# Patient Record
Sex: Female | Born: 1950 | Race: White | Hispanic: No | Smoking: Current every day smoker
Health system: Southern US, Community
[De-identification: ages and names within clinical notes are randomized; demographics above are authoritative.]

## PROBLEM LIST (undated history)

## (undated) DIAGNOSIS — R413 Other amnesia: Secondary | ICD-10-CM

## (undated) DIAGNOSIS — J45909 Unspecified asthma, uncomplicated: Secondary | ICD-10-CM

## (undated) DIAGNOSIS — G8929 Other chronic pain: Secondary | ICD-10-CM

## (undated) DIAGNOSIS — R7303 Prediabetes: Secondary | ICD-10-CM

## (undated) DIAGNOSIS — R29898 Other symptoms and signs involving the musculoskeletal system: Secondary | ICD-10-CM

## (undated) DIAGNOSIS — R569 Unspecified convulsions: Secondary | ICD-10-CM

## (undated) DIAGNOSIS — J189 Pneumonia, unspecified organism: Secondary | ICD-10-CM

## (undated) DIAGNOSIS — M1990 Unspecified osteoarthritis, unspecified site: Secondary | ICD-10-CM

## (undated) DIAGNOSIS — R296 Repeated falls: Secondary | ICD-10-CM

## (undated) DIAGNOSIS — R519 Headache, unspecified: Secondary | ICD-10-CM

## (undated) DIAGNOSIS — I10 Essential (primary) hypertension: Secondary | ICD-10-CM

## (undated) DIAGNOSIS — Z9989 Dependence on other enabling machines and devices: Secondary | ICD-10-CM

## (undated) DIAGNOSIS — K219 Gastro-esophageal reflux disease without esophagitis: Secondary | ICD-10-CM

## (undated) DIAGNOSIS — R42 Dizziness and giddiness: Secondary | ICD-10-CM

## (undated) DIAGNOSIS — F419 Anxiety disorder, unspecified: Secondary | ICD-10-CM

## (undated) DIAGNOSIS — J449 Chronic obstructive pulmonary disease, unspecified: Secondary | ICD-10-CM

## (undated) DIAGNOSIS — G629 Polyneuropathy, unspecified: Secondary | ICD-10-CM

## (undated) DIAGNOSIS — I209 Angina pectoris, unspecified: Secondary | ICD-10-CM

## (undated) DIAGNOSIS — M25569 Pain in unspecified knee: Secondary | ICD-10-CM

## (undated) DIAGNOSIS — R4189 Other symptoms and signs involving cognitive functions and awareness: Secondary | ICD-10-CM

## (undated) DIAGNOSIS — R52 Pain, unspecified: Secondary | ICD-10-CM

## (undated) DIAGNOSIS — J42 Unspecified chronic bronchitis: Secondary | ICD-10-CM

## (undated) DIAGNOSIS — I219 Acute myocardial infarction, unspecified: Secondary | ICD-10-CM

## (undated) DIAGNOSIS — R51 Headache: Secondary | ICD-10-CM

## (undated) DIAGNOSIS — G43909 Migraine, unspecified, not intractable, without status migrainosus: Secondary | ICD-10-CM

## (undated) DIAGNOSIS — M48062 Spinal stenosis, lumbar region with neurogenic claudication: Secondary | ICD-10-CM

## (undated) DIAGNOSIS — M549 Dorsalgia, unspecified: Secondary | ICD-10-CM

## (undated) DIAGNOSIS — F329 Major depressive disorder, single episode, unspecified: Secondary | ICD-10-CM

## (undated) DIAGNOSIS — IMO0001 Reserved for inherently not codable concepts without codable children: Secondary | ICD-10-CM

## (undated) HISTORY — DX: Other amnesia: R41.3

## (undated) HISTORY — PX: BACK SURGERY: SHX140

---
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
## 2005-10-13 HISTORY — PX: KNEE ARTHROSCOPY: SHX127

## 2009-02-11 ENCOUNTER — Emergency Department (HOSPITAL_COMMUNITY): Admission: EM | Admit: 2009-02-11 | Discharge: 2009-02-11 | Payer: Self-pay | Admitting: Emergency Medicine

## 2009-06-22 ENCOUNTER — Emergency Department (HOSPITAL_COMMUNITY): Admission: EM | Admit: 2009-06-22 | Discharge: 2009-06-22 | Payer: Self-pay | Admitting: Emergency Medicine

## 2010-01-11 ENCOUNTER — Emergency Department (HOSPITAL_COMMUNITY): Admission: EM | Admit: 2010-01-11 | Discharge: 2010-01-11 | Payer: Self-pay | Admitting: Emergency Medicine

## 2010-07-31 ENCOUNTER — Emergency Department (HOSPITAL_COMMUNITY): Admission: EM | Admit: 2010-07-31 | Discharge: 2010-08-01 | Payer: Self-pay | Admitting: Emergency Medicine

## 2011-01-01 LAB — URINE MICROSCOPIC-ADD ON

## 2011-01-01 LAB — BASIC METABOLIC PANEL
Calcium: 9.3 mg/dL (ref 8.4–10.5)
Chloride: 105 mEq/L (ref 96–112)
Creatinine, Ser: 0.79 mg/dL (ref 0.4–1.2)
GFR calc Af Amer: >60 mL/min (ref >60)
GFR calc non Af Amer: >60 mL/min (ref >60)

## 2011-01-01 LAB — URINALYSIS, ROUTINE W REFLEX MICROSCOPIC
Glucose, UA: NEGATIVE mg/dL
Leukocytes, UA: NEGATIVE
Specific Gravity, Urine: 1.025 (ref 1.005–1.030)
pH: 5.5 (ref 5.0–8.0)

## 2011-01-01 LAB — DIFFERENTIAL
Lymphocytes Relative: 39 % (ref 12–46)
Lymphs Abs: 4.9 10*3/uL — ABNORMAL HIGH (ref 0.7–4.0)
Monocytes Relative: 10 % (ref 3–12)
Neutro Abs: 6.2 10*3/uL (ref 1.7–7.7)
Neutrophils Relative %: 48 % (ref 43–77)

## 2011-01-01 LAB — CBC
MCV: 89.1 fL (ref 78.0–100.0)
RBC: 5.06 MIL/uL (ref 3.87–5.11)
WBC: 12.8 10*3/uL — ABNORMAL HIGH (ref 4.0–10.5)

## 2011-04-05 ENCOUNTER — Emergency Department (HOSPITAL_COMMUNITY)
Admission: EM | Admit: 2011-04-05 | Discharge: 2011-04-05 | Disposition: A | Discharge status: Home / Self Care | Payer: Self-pay | Attending: Emergency Medicine | Admitting: Emergency Medicine

## 2011-04-05 DIAGNOSIS — M25569 Pain in unspecified knee: Secondary | ICD-10-CM | POA: Insufficient documentation

## 2011-04-05 LAB — BASIC METABOLIC PANEL
Calcium: 9.9 mg/dL (ref 8.4–10.5)
GFR calc Af Amer: >60 mL/min (ref >60)
GFR calc non Af Amer: >60 mL/min (ref >60)
Potassium: 3.7 mEq/L (ref 3.5–5.1)
Sodium: 135 mEq/L (ref 135–145)

## 2011-05-16 ENCOUNTER — Encounter: Payer: Self-pay | Admitting: *Deleted

## 2011-05-16 ENCOUNTER — Emergency Department (HOSPITAL_COMMUNITY)
Admission: EM | Admit: 2011-05-16 | Discharge: 2011-05-17 | Disposition: A | Discharge status: Home / Self Care | Payer: Self-pay | Attending: Emergency Medicine | Admitting: Emergency Medicine

## 2011-05-16 DIAGNOSIS — M542 Cervicalgia: Secondary | ICD-10-CM | POA: Insufficient documentation

## 2011-05-16 DIAGNOSIS — M25559 Pain in unspecified hip: Secondary | ICD-10-CM | POA: Insufficient documentation

## 2011-05-16 DIAGNOSIS — R51 Headache: Secondary | ICD-10-CM | POA: Insufficient documentation

## 2011-05-16 DIAGNOSIS — R42 Dizziness and giddiness: Secondary | ICD-10-CM | POA: Insufficient documentation

## 2011-05-16 DIAGNOSIS — M25569 Pain in unspecified knee: Secondary | ICD-10-CM | POA: Insufficient documentation

## 2011-05-16 DIAGNOSIS — F17200 Nicotine dependence, unspecified, uncomplicated: Secondary | ICD-10-CM | POA: Insufficient documentation

## 2011-05-16 DIAGNOSIS — R1110 Vomiting, unspecified: Secondary | ICD-10-CM | POA: Insufficient documentation

## 2011-05-16 DIAGNOSIS — IMO0001 Reserved for inherently not codable concepts without codable children: Secondary | ICD-10-CM | POA: Insufficient documentation

## 2011-05-16 DIAGNOSIS — M25469 Effusion, unspecified knee: Secondary | ICD-10-CM | POA: Insufficient documentation

## 2011-05-16 HISTORY — DX: Unspecified osteoarthritis, unspecified site: M19.90

## 2011-05-16 MED ORDER — IBUPROFEN 800 MG PO TABS
800.0000 mg | ORAL_TABLET | Freq: Once | ORAL | Status: AC
  Administered 2011-05-16: 800 mg via ORAL
  Filled 2011-05-16: qty 1

## 2011-05-16 MED ORDER — HYDROCODONE-ACETAMINOPHEN 5-325 MG PO TABS
2.0000 | ORAL_TABLET | Freq: Once | ORAL | Status: AC
  Administered 2011-05-16: 2 via ORAL
  Filled 2011-05-16: qty 2

## 2011-05-16 MED ORDER — ONDANSETRON HCL 4 MG PO TABS
4.0000 mg | ORAL_TABLET | Freq: Once | ORAL | Status: AC
  Administered 2011-05-16: 4 mg via ORAL
  Filled 2011-05-16: qty 1

## 2011-05-16 NOTE — ED Provider Notes (Signed)
History     CSN: [id number] Arrival date & time: 05/16/2011 10:53 PM  Chief Complaint  Patient presents with  . Knee Pain  . Hip Pain    bilateral  . Headache   Patient is a 60 y.o. female presenting with knee pain and headaches. The history is provided by the patient.  Knee Pain This is a recurrent problem. The current episode started in the past 7 days. The problem occurs daily. Associated symptoms include arthralgias, headaches, myalgias, nausea and neck pain. The symptoms are aggravated by nothing. She has tried acetaminophen for the symptoms. The treatment provided no relief.  Headache  Associated symptoms include nausea.  Knee Pain This is a recurrent problem. The current episode started in the past 7 days. The problem occurs daily. Associated symptoms include headaches. The symptoms are aggravated by nothing. She has tried acetaminophen for the symptoms. The treatment provided no relief.    Past Medical History  Diagnosis Date  . Arthritis   . Bronchitis     Past Surgical History  Procedure Date  . Knee surgery   . Appendectomy   . Tonsillectomy     History reviewed. No pertinent family history.  History  Substance Use Topics  . Smoking status: Current Everyday Smoker -- 0.5 packs/day  . Smokeless tobacco: Not on file  . Alcohol Use: No    OB History    Grav Para Term Preterm Abortions TAB SAB Ect Mult Living                  Review of Systems  HENT: Positive for neck pain.   Gastrointestinal: Positive for nausea.  Musculoskeletal: Positive for myalgias and arthralgias.  Neurological: Positive for light-headedness and headaches.    Physical Exam  BP 121/81  Pulse 93  Temp(Src) 98.4 F (36.9 C) (Oral)  Resp 18  Physical Exam  Nursing note and vitals reviewed. Constitutional: She is oriented to person, place, and time. She appears well-developed and well-nourished.  Non-toxic appearance.  HENT:  Head: Normocephalic.  Right Ear: Tympanic membrane  and external ear normal.  Left Ear: Tympanic membrane and external ear normal.  Eyes: EOM and lids are normal. Pupils are equal, round, and reactive to light.  Neck: Normal range of motion. Neck supple. Carotid bruit is not present.  Cardiovascular: Normal rate, regular rhythm, normal heart sounds, intact distal pulses and normal pulses.   Pulmonary/Chest: Breath sounds normal. No respiratory distress.  Abdominal: Soft. Bowel sounds are normal. There is no tenderness. There is no guarding.  Musculoskeletal: Normal range of motion.       Mild to mod effusion of both knees. Warm but not hot. Distal pulse symetrical. Sensory wnl. Pain of the hips with attempted ROM. No deformity noted.  Lymphadenopathy:       Head (right side): No submandibular adenopathy present.       Head (left side): No submandibular adenopathy present.    She has no cervical adenopathy.  Neurological: She is alert and oriented to person, place, and time. She has normal strength. Coordination normal. GCS eye subscore is 4. GCS verbal subscore is 5. GCS motor subscore is 6.  Skin: Skin is warm and dry.  Psychiatric: Her speech is normal and behavior is normal. Judgment and thought content normal.       Pt upset over a situation between her daughters and her increasing pain. Tearful at times.    ED Course  Procedures  MDM I have reviewed nursing notes, vital signs,  and all appropriate lab and imaging results for this patient.      Kathie Dike, [place] 05/16/11 2331 Medical screening examination/treatment/procedure(s) were performed by non-physician practitioner and as supervising physician I was immediately available for consultation/collaboration.  Nicoletta Dress. Colon Branch, MD 05/28/11 2047

## 2011-05-16 NOTE — ED Notes (Signed)
Pt presents after reportedly lowering self to ground in gas station parking lot because has knee and hip pain and headache because of heat

## 2011-05-17 MED ORDER — HYDROCODONE-ACETAMINOPHEN 5-325 MG PO TABS: ORAL_TABLET | ORAL | Status: DC

## 2011-08-02 ENCOUNTER — Encounter (HOSPITAL_COMMUNITY): Payer: Self-pay

## 2011-08-02 ENCOUNTER — Emergency Department (HOSPITAL_COMMUNITY): Payer: Self-pay

## 2011-08-02 ENCOUNTER — Emergency Department (HOSPITAL_COMMUNITY)
Admission: EM | Admit: 2011-08-02 | Discharge: 2011-08-03 | Disposition: A | Discharge status: Home / Self Care | Payer: Self-pay | Attending: Emergency Medicine | Admitting: Emergency Medicine

## 2011-08-02 ENCOUNTER — Other Ambulatory Visit: Payer: Self-pay

## 2011-08-02 DIAGNOSIS — M25559 Pain in unspecified hip: Secondary | ICD-10-CM | POA: Insufficient documentation

## 2011-08-02 DIAGNOSIS — M129 Arthropathy, unspecified: Secondary | ICD-10-CM | POA: Insufficient documentation

## 2011-08-02 DIAGNOSIS — R0789 Other chest pain: Secondary | ICD-10-CM

## 2011-08-02 DIAGNOSIS — M25569 Pain in unspecified knee: Secondary | ICD-10-CM | POA: Insufficient documentation

## 2011-08-02 DIAGNOSIS — F17200 Nicotine dependence, unspecified, uncomplicated: Secondary | ICD-10-CM | POA: Insufficient documentation

## 2011-08-02 DIAGNOSIS — R071 Chest pain on breathing: Secondary | ICD-10-CM | POA: Insufficient documentation

## 2011-08-02 DIAGNOSIS — J40 Bronchitis, not specified as acute or chronic: Secondary | ICD-10-CM | POA: Insufficient documentation

## 2011-08-02 DIAGNOSIS — R05 Cough: Secondary | ICD-10-CM | POA: Insufficient documentation

## 2011-08-02 DIAGNOSIS — R059 Cough, unspecified: Secondary | ICD-10-CM | POA: Insufficient documentation

## 2011-08-02 LAB — POCT I-STAT, CHEM 8
BUN: 22 mg/dL (ref 6–23)
Calcium, Ion: 1.1 mmol/L — ABNORMAL LOW (ref 1.12–1.32)
Creatinine, Ser: 0.8 mg/dL (ref 0.50–1.10)
Hemoglobin: 17 g/dL — ABNORMAL HIGH (ref 12.0–15.0)
Sodium: 142 mEq/L (ref 135–145)
TCO2: 26 mmol/L (ref 0–100)

## 2011-08-02 LAB — POCT I-STAT TROPONIN I

## 2011-08-02 MED ORDER — NITROGLYCERIN 0.4 MG SL SUBL
0.4000 mg | SUBLINGUAL_TABLET | SUBLINGUAL | Status: DC | PRN
  Administered 2011-08-02: 0.4 mg via SUBLINGUAL
  Filled 2011-08-02: qty 25

## 2011-08-02 MED ORDER — ASPIRIN 81 MG PO CHEW
324.0000 mg | CHEWABLE_TABLET | Freq: Once | ORAL | Status: AC
  Administered 2011-08-02: 324 mg via ORAL
  Filled 2011-08-02: qty 4

## 2011-08-02 MED ORDER — SODIUM CHLORIDE 0.9 % IV SOLN: Freq: Once | INTRAVENOUS | Status: DC

## 2011-08-02 NOTE — ED Notes (Signed)
Pt complaining of cough for 2-3 weeks.  Lung sounds posterior, diminished.

## 2011-08-02 NOTE — ED Notes (Signed)
Dull pressure to left chest started 1 hour ago and worsening since

## 2011-08-03 MED ORDER — HYDROCODONE-ACETAMINOPHEN 5-325 MG PO TABS
2.0000 | ORAL_TABLET | Freq: Once | ORAL | Status: AC
  Administered 2011-08-03: 2 via ORAL
  Filled 2011-08-03: qty 2

## 2011-08-03 NOTE — ED Notes (Signed)
Nitro Sl 0.4 mg  Base Vitals: Begin 147/67 RR18, HR 69, O2Sat 98 2LNC Pain 10/10 1st Tab                                            133/65       18        66             97                     4/10 2nd Tab                                           137/65       18        65             98                     4/10 3rd Tab                                            142/66       18        64             96                     2/10

## 2011-08-03 NOTE — ED Provider Notes (Signed)
History     CSN: [id number] Arrival date & time: 08/02/2011 11:17 PM   First MD Initiated Contact with Patient 08/02/11 2334      Chief Complaint  Patient presents with  . Chest Pain    (Consider location/radiation/quality/duration/timing/severity/associated sxs/prior treatment) HPI Comments: Seen 2334 Patient with c/o dull chest pressure x 1 hour. Also had chronic pain to both knees and hips. She is seen by [facility] Department. Pressure to chest feels like a bubble. States it is not made worse by deep breathing. Denies shortness of breath, nausea, vomiting.Patient is on narcotic analgesics for her chronic pain. She states they have not helped with current pain. Patient is a 59 y.o. female presenting with chest pain. The history is provided by the patient.  Chest Pain The chest pain began less than 1 hour ago (pressure in left chest). Chest pain occurs frequently. The chest pain is worsening. At its most intense, the pain is at 5/10. The pain is currently at 4/10. The severity of the pain is moderate. The quality of the pain is described as dull and pressure-like. The pain does not radiate. Exacerbated by: nothing. She tried nothing for the symptoms.     Past Medical History  Diagnosis Date  . Arthritis   . Bronchitis     Past Surgical History  Procedure Date  . Knee surgery   . Appendectomy   . Tonsillectomy     No family history on file.  History  Substance Use Topics  . Smoking status: Current Everyday Smoker -- 0.5 packs/day  . Smokeless tobacco: Not on file  . Alcohol Use: No    OB History    Grav Para Term Preterm Abortions TAB SAB Ect Mult Living                  Review of Systems  Cardiovascular: Positive for chest pain.  Musculoskeletal:       Knee pain, hip pain  All other systems reviewed and are negative.    Allergies  Amitriptyline and Tramadol  Home Medications   Current Outpatient Rx  Name Route Sig Dispense Refill  . ALBUTEROL SULFATE  (2.5 MG/3ML) 0.083% IN NEBU Inhalation Inhale 2.5 mg into the lungs every 6 (six) hours as needed.      Marland Kitchen HYDROCODONE-ACETAMINOPHEN 5-325 MG PO TABS  1 or 2 po q4h prn pain 20 tablet 0  . ACETAMINOPHEN 500 MG PO TABS Oral Take 500 mg by mouth every 6 (six) hours as needed.        BP 156/63  Temp(Src) 97.9 F (36.6 C) (Oral)  Resp 22  Ht 5' 3.75" (1.619 m)  Wt 205 lb (92.987 kg)  BMI 35.46 kg/m2  SpO2 100%  Physical Exam  Nursing note and vitals reviewed. Constitutional: She is oriented to person, place, and time. She appears well-developed and well-nourished.       anxious  HENT:  Head: Normocephalic and atraumatic.  Eyes: EOM are normal.  Neck: Normal range of motion.  Cardiovascular: Normal rate, normal heart sounds and intact distal pulses.   Pulmonary/Chest: Effort normal and breath sounds normal. No respiratory distress. She has no wheezes. She has no rales. She exhibits no tenderness.  Abdominal: Soft. Bowel sounds are normal.  Musculoskeletal: Normal range of motion.       Knees with mild crepitus bilaterally, no effusions, FROM. Hips with FROM.  Neurological: She is alert and oriented to person, place, and time.  Skin: Skin is warm and dry.  ED Course  Procedures (including critical care time) Results for orders placed during the hospital encounter of 08/02/11  POCT I-STAT TROPONIN I      Component Value Range   Troponin i, poc 0.01  0.00 - 0.08 (ng/mL)   Comment 3           POCT I-STAT, CHEM 8      Component Value Range   Sodium 142  135 - 145 (mEq/L)   Potassium 4.0  3.5 - 5.1 (mEq/L)   Chloride 106  96 - 112 (mEq/L)   BUN 22  6 - 23 (mg/dL)   Creatinine, Ser 1.61  0.50 - 1.10 (mg/dL)   Glucose, Bld 92  70 - 99 (mg/dL)   Calcium, Ion 0.96 (*) 1.12 - 1.32 (mmol/L)   TCO2 26  0 - 100 (mmol/L)   Hemoglobin 17.0 (*) 12.0 - 15.0 (g/dL)   HCT 04.5 (*) 40.9 - 46.0 (%)    Dg Chest Portable 1 View  08/03/2011  *RADIOLOGY REPORT*  Clinical Data: Cough for 2 to  3 weeks.  History of bronchitis and smoking.  PORTABLE CHEST - 1 VIEW  Comparison: None.  Findings: The lungs are well-aerated.  Minimal bilateral atelectasis is noted in There is no evidence of pleural effusion or pneumothorax.  The cardiomediastinal silhouette is within normal limits.  No acute osseous abnormalities are seen.  IMPRESSION: Minimal bilateral atelectasis noted; lungs otherwise clear.  Original Report Authenticated By: Tonia Ghent, M.D.    Date: 08/03/2011  2310  Rate:73  Rhythm: normal sinus rhythm  QRS Axis: normal  Intervals: normal  ST/T Wave abnormalities: normal   Conduction Disutrbances:none  Narrative Interpretation:   Old EKG Reviewed: none available   No diagnosis found.    MDM  Patient with chest pressure that began an hour PTA. Given aspirin, ntg x 3 with no relief. Labs, EKG, xray unremarkable. Troponin negative. Patient is low risk for ACS. Patient given narcotic analgesics with improvement in pain. Pt feels improved after observation and/or treatment in ED.Pt stable in ED with no significant deterioration in condition.The patient appears reasonably screened and/or stabilized for discharge and I doubt any other medical condition or other [facility] requiring further screening, evaluation, or treatment in the ED at this time prior to discharge. MDM Reviewed: nursing note, vitals and previous chart Interpretation: labs, ECG and x-ray Total time providing critical care: 40.           Nicoletta Dress. Colon Branch, MD 08/11/11 1726

## 2011-08-03 NOTE — ED Notes (Signed)
Pt daughter upset because her mother was not given RX for Lortab.

## 2012-01-06 ENCOUNTER — Encounter (HOSPITAL_COMMUNITY): Payer: Self-pay | Admitting: Emergency Medicine

## 2012-01-06 ENCOUNTER — Emergency Department (HOSPITAL_COMMUNITY): Payer: Self-pay

## 2012-01-06 ENCOUNTER — Emergency Department (HOSPITAL_COMMUNITY)
Admission: EM | Admit: 2012-01-06 | Discharge: 2012-01-06 | Disposition: A | Discharge status: Home / Self Care | Payer: Self-pay | Attending: Emergency Medicine | Admitting: Emergency Medicine

## 2012-01-06 DIAGNOSIS — M79609 Pain in unspecified limb: Secondary | ICD-10-CM | POA: Insufficient documentation

## 2012-01-06 DIAGNOSIS — F17200 Nicotine dependence, unspecified, uncomplicated: Secondary | ICD-10-CM | POA: Insufficient documentation

## 2012-01-06 DIAGNOSIS — M25559 Pain in unspecified hip: Secondary | ICD-10-CM | POA: Insufficient documentation

## 2012-01-06 DIAGNOSIS — M5430 Sciatica, unspecified side: Secondary | ICD-10-CM | POA: Insufficient documentation

## 2012-01-06 DIAGNOSIS — M5431 Sciatica, right side: Secondary | ICD-10-CM

## 2012-01-06 DIAGNOSIS — Z8739 Personal history of other diseases of the musculoskeletal system and connective tissue: Secondary | ICD-10-CM | POA: Insufficient documentation

## 2012-01-06 MED ORDER — ONDANSETRON 8 MG PO TBDP
8.0000 mg | ORAL_TABLET | Freq: Once | ORAL | Status: AC
  Administered 2012-01-06: 8 mg via ORAL
  Filled 2012-01-06: qty 1

## 2012-01-06 MED ORDER — PREDNISONE 20 MG PO TABS: 20.0000 mg | ORAL_TABLET | Freq: Two times a day (BID) | ORAL | Status: AC

## 2012-01-06 MED ORDER — DIAZEPAM 10 MG PO TABS: 10.0000 mg | ORAL_TABLET | Freq: Four times a day (QID) | ORAL | Status: AC | PRN

## 2012-01-06 MED ORDER — DIAZEPAM 5 MG PO TABS
10.0000 mg | ORAL_TABLET | Freq: Once | ORAL | Status: AC
  Administered 2012-01-06: 10 mg via ORAL

## 2012-01-06 MED ORDER — HYDROMORPHONE HCL PF 2 MG/ML IJ SOLN
2.0000 mg | Freq: Once | INTRAMUSCULAR | Status: AC
  Administered 2012-01-06: 2 mg via INTRAMUSCULAR
  Filled 2012-01-06: qty 1

## 2012-01-06 MED ORDER — OXYCODONE-ACETAMINOPHEN 5-325 MG PO TABS: 1.0000 | ORAL_TABLET | ORAL | Status: AC | PRN

## 2012-01-06 MED ORDER — PREDNISONE 20 MG PO TABS
60.0000 mg | ORAL_TABLET | Freq: Once | ORAL | Status: AC
  Administered 2012-01-06: 60 mg via ORAL
  Filled 2012-01-06: qty 3

## 2012-01-06 NOTE — Discharge Instructions (Signed)
Use a resource guide to find a primary care Dr. to see to help manage her pain and prescribe medications. Try to see orthopedic doctor for further care.  Use heat on the sore area 3-4 times a day for 30-40 minutes.    Back Exercises Back exercises help treat and prevent back injuries. The goal is to increase your strength in your belly (abdominal) and back muscles. These exercises can also help with flexibility. Start these exercises when told by your doctor. HOME CARE Back exercises include: Pelvic Tilt.  Lie on your back with your knees bent. Tilt your pelvis until the lower part of your back is against the floor. Hold this position 5 to 10 sec. Repeat this exercise 5 to 10 times.  Knee to Chest.  Pull 1 knee up against your chest and hold for 20 to 30 seconds. Repeat this with the other knee. This may be done with the other leg straight or bent, whichever feels better. Then, pull both knees up against your chest.  Sit-Ups or Curl-Ups.  Bend your knees 90 degrees. Start with tilting your pelvis, and do a partial, slow sit-up. Only lift your upper half 30 to 45 degrees off the floor. Take at least 2 to 3 seonds for each sit-up. Do not do sit-ups with your knees out straight. If partial sit-ups are difficult, simply do the above but with only tightening your belly (abdominal) muscles and holding it as told.  Hip-Lift.  Lie on your back with your knees flexed 90 degrees. Push down with your feet and shoulders as you raise your hips 2 inches off the floor. Hold for 10 seconds, repeat 5 to 10 times.  Back Arches.  Lie on your stomach. Prop yourself up on bent elbows. Slowly press on your hands, causing an arch in your low back. Repeat 3 to 5 times.  Shoulder-Lifts.  Lie face down with arms beside your body. Keep hips and belly pressed to floor as you slowly lift your head and shoulders off the floor.  Do not overdo your exercises. Be careful in the beginning. Exercises may cause you some mild  back discomfort. If the pain lasts for more than 15 minutes, stop the exercises until you see your doctor. Improvement with exercise for back problems is slow.  Document Released: 11/01/2010 Document Revised: 09/18/2011 Document Reviewed: 11/01/2010 [facility] Patient Information 2012 [place], [place].Neuropathic Pain We often think that pain has a physical cause. If we get rid of the cause, the pain should go away. Nerves themselves can also cause pain. It is called neuropathic pain, which means nerve abnormality. It may be difficult for the patients who have it and for the treating caregivers. Pain is usually described as acute (short-lived) or chronic (long-lasting). Acute pain is related to the physical sensations caused by an injury. It can last from a few seconds to many weeks, but it usually goes away when normal healing occurs. Chronic pain lasts beyond the typical healing time. With neuropathic pain, the nerve fibers themselves may be damaged or injured. They then send incorrect signals to other pain centers. The pain you feel is real, but the cause is not easy to find.  CAUSES  Chronic pain can result from diseases, such as diabetes and shingles (an infection related to chickenpox), or from trauma, surgery, or amputation. It can also happen without any known injury or disease. The nerves are sending pain messages, even though there is no identifiable cause for such messages.   Other common  causes of neuropathy include diabetes, phantom limb pain, or Regional Pain Syndrome (RPS).   As with all forms of chronic back pain, if neuropathy is not correctly treated, there can be a number of associated problems that lead to a downward cycle for the patient. These include depression, sleeplessness, feelings of fear and anxiety, limited social interaction and inability to do normal daily activities or work.   The most dramatic and mysterious example of neuropathic pain is called "phantom limb syndrome." This  occurs when an arm or a leg has been removed because of illness or injury. The brain still gets pain messages from the nerves that originally carried impulses from the missing limb. These nerves now seem to misfire and cause troubling pain.   Neuropathic pain often seems to have no cause. It responds poorly to standard pain treatment.  Neuropathic pain can occur after:  Shingles (herpes zoster virus infection).   A lasting burning sensation of the skin, caused usually by injury to a peripheral nerve.   Peripheral neuropathy which is widespread nerve damage, often caused by diabetes or alcoholism.   Phantom limb pain following an amputation.   Facial nerve problems (trigeminal neuralgia).   Multiple sclerosis.   Reflex sympathetic dystrophy.   Pain which comes with cancer and cancer chemotherapy.   Entrapment neuropathy such as when pressure is put on a nerve such as in carpal tunnel syndrome.   Back, leg, and hip problems (sciatica).   Spine or back surgery.   HIV Infection or AIDS where nerves are infected by viruses.  Your caregiver can explain items in the above list which may apply to you. SYMPTOMS  Characteristics of neuropathic pain are:  Severe, sharp, electric shock-like, shooting, lightening-like, knife-like.   Pins and needles sensation.   Deep burning, deep cold, or deep ache.   Persistent numbness, tingling, or weakness.   Pain resulting from light touch or other stimulus that would not usually cause pain.   Increased sensitivity to something that would normally cause pain, such as a pinprick.  Pain may persist for months or years following the healing of damaged tissues. When this happens, pain signals no longer sound an alarm about current injuries or injuries about to happen. Instead, the alarm system itself is not working correctly.  Neuropathic pain may get worse instead of better over time. For some people, it can lead to serious disability. It is  important to be aware that severe injury in a limb can occur without a proper, protective pain response.Burns, cuts, and other injuries may go unnoticed. Without proper treatment, these injuries can become infected or lead to further disability. Take any injury seriously, and consult your caregiver for treatment. DIAGNOSIS  When you have a pain with no known cause, your caregiver will probably ask some specific questions:   Do you have any other conditions, such as diabetes, shingles, multiple sclerosis, or HIV infection?   How would you describe your pain? (Neuropathic pain is often described as shooting, stabbing, burning, or searing.)   Is your pain worse at any time of the day? (Neuropathic pain is usually worse at night.)   Does the pain seem to follow a certain physical pathway?   Does the pain come from an area that has missing or injured nerves? (An example would be phantom limb pain.)   Is the pain triggered by minor things such as rubbing against the sheets at night?  These questions often help define the type of pain involved. Once your  caregiver knows what is happening, treatment can begin. Anticonvulsant, antidepressant drugs, and various pain relievers seem to work in some cases. If another condition, such as diabetes is involved, better management of that disorder may relieve the neuropathic pain.  TREATMENT  Neuropathic pain is frequently long-lasting and tends not to respond to treatment with narcotic type pain medication. It may respond well to other drugs such as antiseizure and antidepressant medications. Usually, neuropathic problems do not completely go away, but partial improvement is often possible with proper treatment. Your caregivers have large numbers of medications available to treat you. Do not be discouraged if you do not get immediate relief. Sometimes different medications or a combination of medications will be tried before you receive the results you are hoping  for. See your caregiver if you have pain that seems to be coming from nowhere and does not go away. Help is available.  SEEK IMMEDIATE MEDICAL CARE IF:   There is a sudden change in the quality of your pain, especially if the change is on only one side of the body.   You notice changes of the skin, such as redness, black or purple discoloration, swelling, or an ulcer.   You cannot move the affected limbs.  Document Released: 06/26/2004 Document Revised: 09/18/2011 Document Reviewed: 06/26/2004 [facility] Patient Information 2012 [place], [place].Sciatica Sciatica is a weakness and/or changes in sensation (tingling, jolts, hot and cold, numbness) along the path the sciatic nerve travels. Irritation or damage to lumbar nerve roots is often also referred to as lumbar radiculopathy.  Lumbar radiculopathy (Sciatica) is the most common form of this problem. Radiculopathy can occur in any of the nerves coming out of the spinal cord. The problems caused depend on which nerves are involved. The sciatic nerve is the large nerve supplying the branches of nerves going from the hip to the toes. It often causes a numbness or weakness in the skin and/or muscles that the sciatic nerve serves. It also may cause symptoms (problems) of pain, burning, tingling, or electric shock-like feelings in the path of this nerve. This usually comes from injury to the fibers that make up the sciatic nerve. Some of these symptoms are low back pain and/or unpleasant feelings in the following areas:  From the mid-buttock down the back of the leg to the back of the knee.   And/or the outside of the calf and top of the foot.   And/or behind the inner ankle to the sole of the foot.  CAUSES   Herniated or slipped disc. Discs are the little cushions between the bones in the back.   Pressure by the piriformis muscle in the buttock on the sciatic nerve (Piriformis Syndrome).   Misalignment of the bones in the lower back and buttocks  (Sacroiliac Joint Derangement).   Narrowing of the spinal canal that puts pressure on or pinches the fibers that make up the sciatic nerve.   A slipped vertebra that is out of line with those above or beneath it.   Abnormality of the nervous system itself so that nerve fibers do not transmit signals properly, especially to feet and calves (neuropathy).   Tumor (this is rare).  Your caregiver can usually determine the cause of your sciatica and begin the treatment most likely to help you. TREATMENT  Taking over-the-counter painkillers, physical therapy, rest, exercise, spinal manipulation, and injections of anesthetics and/or steroids may be used. Surgery, acupuncture, and Yoga can also be effective. Mind over matter techniques, mental imagery, and changing factors  such as your bed, chair, desk height, posture, and activities are other treatments that may be helpful. You and your caregiver can help determine what is best for you. With proper diagnosis, the cause of most sciatica can be identified and removed. Communication and cooperation between your caregiver and you is essential. If you are not successful immediately, do not be discouraged. With time, a proper treatment can be found that will make you comfortable. HOME CARE INSTRUCTIONS   If the pain is coming from a problem in the back, applying ice to that area for 15 to 20 minutes, 3 to 4 times per day while awake, may be helpful. Put the ice in a plastic bag. Place a towel between the bag of ice and your skin.   You may exercise or perform your usual activities if these do not aggravate your pain, or as suggested by your caregiver.   Only take over-the-counter or prescription medicines for pain, discomfort, or fever as directed by your caregiver.   If your caregiver has given you a follow-up appointment, it is very important to keep that appointment. Not keeping the appointment could result in a chronic or permanent injury, pain, and  disability. If there is any problem keeping the appointment, you must call back to this facility for assistance.  SEEK IMMEDIATE MEDICAL CARE IF:   You experience loss of control of bowel or bladder.   You have increasing weakness in the trunk, buttocks, or legs.   There is numbness in any areas from the hip down to the toes.   You have difficulty walking or keeping your balance.   You have any of the above, with fever or forceful vomiting.  Document Released: 09/23/2001 Document Revised: 09/18/2011 Document Reviewed: 05/12/2008 [facility] Patient Information 2012 [place], [place].   RESOURCE GUIDE  Dental Problems  Patients with Medicaid: [facility] [phone]                                           [phone] [address] Phone:  [phone]                                                  Phone:  [phone]  If unable to pay or uninsured, contact:  [facility] or [facility]. to become qualified for the adult dental clinic.  Chronic Pain Problems Contact [facility]  [phone] Patients need to be referred by their primary care doctor.  Insufficient Money for Medicine Contact [employer]:  call "211" or [facility] 660-300-5672.  No Primary Care Doctor Call [facility]  5707655184 Other agencies that provide inexpensive medical care    [facility]  [phone]    [facility]  [phone]    [facility]  [phone]    [facility]  [phone]    [facility]  [phone]    [facility]  [phone]  Psychological Services [facility]  [phone] [facility]  [phone] [facility]   [phone] (emergency services [phone])  Substance Abuse  Resources Alcohol and Drug Services  [phone] [facility] ([phone] [employer] [phone] Floydene Flock  [phone] Residential & Outpatient Substance Abuse Program  [phone]  Abuse/Neglect [facility] Child Abuse Hotline 705-593-6610 [facility] Child Abuse Hotline (937)117-9274 (After Hours)  Emergency Shelter [facility] [phone]  Maternity Homes [facility] [place] [facility] 780-590-5877 [facility] [phone]  MRSA Hotline #:   270-312-8753    [facility] Resources  [facility] [place]     [employer]                          [facility] Dept. [address] [place]                       [address]      [address]  Blondell Reveal Phone:  [phone]                                   Phone:  [phone]                 Phone:  [phone]  [facility] Phone:  [phone]  [facility] Child Abuse Hotline [phone] [phone] (After Hours)

## 2012-01-06 NOTE — ED Notes (Signed)
Pillow placed between legs to help with comfort. Patient lying on left side with pillow between legs at this time. States "it feels better like that."

## 2012-01-06 NOTE — ED Provider Notes (Addendum)
History  This chart was scribed for [name], MD by [name]. This patient was seen in room APA07/APA07 and the patient's care was started at 10:25PM.  CSN: [id number]  Arrival date & time 01/06/12  2034   First MD Initiated Contact with Patient 01/06/12 2208      Chief Complaint  Patient presents with  . Hip Pain  . Leg Pain   The history is provided by the patient. No language interpreter was used.    Carla [name] is a 61 y.o. female who presents to the Emergency Department complaining of 1 to 2 months of gradual onset, gradually worsening, intermittent right hip pain with radiation to right buttock and right leg. The pain is described as a cramping feeling. She reports taking tylenol and using hot packs and ice with mild improvement in her symptoms. She states that she tripped while crying a cooking pot and twisted her right hip and right leg. She describes having an "exploding pain" since then that have made the ongoing symptoms worse. She has not seen a doctor due to not having medical insurance. She c/o cough incontinence but denies any other urinary symptoms. She also c/o bilateral knee pain. She states that she had surgery in 2007 and was being followed up with workman's compensation until they dropped her case. She has not followed up with a doctor since then. She states that she normally walks with cane. She denies fever, sore throat, emesis, diarrhea, HA and chest pain as associated symptoms.  She is a current everyday smoker but denies alcohol use.  Past Medical History  Diagnosis Date  . Arthritis   . Bronchitis     Past Surgical History  Procedure Date  . Knee surgery   . Appendectomy   . Tonsillectomy     History reviewed. No pertinent family history.  History  Substance Use Topics  . Smoking status: Current Everyday Smoker -- 0.5 packs/day  . Smokeless tobacco: Not on file  . Alcohol Use: No    Review of Systems  A complete 10 system review  of systems was obtained and is otherwise negative except as noted in the HPI.   Allergies  Amitriptyline and Tramadol  Home Medications   Current Outpatient Rx  Name Route Sig Dispense Refill  . ACETAMINOPHEN 500 MG PO TABS Oral Take 500 mg by mouth every 6 (six) hours as needed.      . ALBUTEROL SULFATE (2.5 MG/3ML) 0.083% IN NEBU Inhalation Inhale 2.5 mg into the lungs every 6 (six) hours as needed.      Marland Kitchen DIAZEPAM 10 MG PO TABS Oral Take 1 tablet (10 mg total) by mouth every 6 (six) hours as needed (muscle spasm). 20 tablet 0  . HYDROCODONE-ACETAMINOPHEN 5-325 MG PO TABS  1 or 2 po q4h prn pain 20 tablet 0  . OXYCODONE-ACETAMINOPHEN 5-325 MG PO TABS Oral Take 1 tablet by mouth every 4 (four) hours as needed for pain. 30 tablet 0  . PREDNISONE 20 MG PO TABS Oral Take 1 tablet (20 mg total) by mouth 2 (two) times daily. 10 tablet 0    Triage Vitals: BP 148/59  Pulse 83  Temp(Src) 97.8 F (36.6 C) (Oral)  Resp 20  Ht 5\' 3"  (1.6 m)  Wt 220 lb (99.791 kg)  BMI 38.97 kg/m2  SpO2 99%  Physical Exam  Nursing note and vitals reviewed. Constitutional: She is oriented to person, place, and time. She appears well-developed and well-nourished.  HENT:  Head: Normocephalic and atraumatic.  Eyes: Conjunctivae and EOM are normal. Pupils are equal, round, and reactive to light.  Neck: Normal range of motion and phonation normal. Neck supple.  Cardiovascular: Normal rate, regular rhythm and intact distal pulses.   Pulmonary/Chest: Effort normal and breath sounds normal. She exhibits no tenderness.  Abdominal: Soft. She exhibits no distension. There is no tenderness. There is no guarding.  Musculoskeletal: She exhibits tenderness (Mild right lateral lumbar tenderness, tenderness along the right buttocks). She exhibits no edema.       Bilateral knee tenderness and swelling, R>L, c/w DJD.  Neurological: She is alert and oriented to person, place, and time. She has normal strength. She exhibits  normal muscle tone.  Skin: Skin is warm and dry.  Psychiatric: She has a normal mood and affect. Her behavior is normal. Judgment and thought content normal.    ED Course  Procedures (including critical care time)  DIAGNOSTIC STUDIES: Oxygen Saturation is 99% on room air, normal by my interpretation.    COORDINATION OF CARE: 10:27PM-Discussed sciatici diagnosis. Discussed hip radiology report and need for back x-ray. Will give pain medications in the meantime.  11:10PM-Pt rechecked and feels better. Discussed radiology report with pt and pt acknowledged results. Will prescribe pain medications. Advised pt to follow up with a doctor on her knee pain. Advised pt to continue to use ice packs and heating pads to treat symptoms as well.   Labs Reviewed - No data to display  Dg Lumbar Spine Complete  01/06/2012  *RADIOLOGY REPORT*  Clinical Data: Larey Seat against refrigerator and hit right hip.  Right hip pain.  LUMBAR SPINE - COMPLETE 4+ VIEW  Comparison: None.  Findings: There is no evidence of fracture or subluxation. Vertebral bodies demonstrate normal height and alignment. Intervertebral disc spaces are preserved.  Small anterior osteophytes are seen at multiple levels.  The visualized neural foramina are grossly unremarkable in appearance.  The visualized bowel gas pattern is unremarkable in appearance; air and stool are noted within the colon.  The sacroiliac joints are within normal limits.  IMPRESSION: No evidence of fracture or subluxation along the lumbar spine.  Original Report Authenticated By: Tonia Ghent, M.D.   Dg Hip Complete Right  01/06/2012  *RADIOLOGY REPORT*  Clinical Data: 61 year old female with right hip pain following injury.  RIGHT HIP - COMPLETE 2+ VIEW  Comparison: 08/01/2010  Findings: There is no evidence of fracture, subluxation or dislocation. No focal bony lesions are identified. Minimal degenerative changes within the hips noted. No focal bony lesions are identified.   IMPRESSION: No evidence of acute abnormality.  Original Report Authenticated By: Rosendo Gros, M.D.     1. Sciatica of right side       MDM  Sciatica without significant lumbar abnormality on plain imaging. She has moderate right knee pain that might be related to the right sided sciatica or DJD. She is stable for discharge. I doubt a cauda equina syndrome, herniated disc in lumbar area, occult fracture, bony tumor.  I personally performed the services described in this documentation, which was scribed in my presence. The recorded information has been reviewed and considered.    Plan: Home Medications- Valium, Percocet, Valium; Home Treatments- Heat TID; Recommended follow up- PCP asap       [name], MD 01/06/12 2329  [name], MD 01/10/12 [phone]

## 2012-01-06 NOTE — ED Notes (Signed)
Patient complaining of right hip pain x 2 weeks. Today tripped and twisted hip and right leg. Complaining of "exploding pain" in right hip radiating down right leg. Denies fall.

## 2012-01-06 NOTE — ED Notes (Signed)
Daughter at bedside.

## 2012-01-30 ENCOUNTER — Emergency Department (HOSPITAL_COMMUNITY): Payer: Self-pay

## 2012-01-30 ENCOUNTER — Encounter (HOSPITAL_COMMUNITY): Payer: Self-pay | Admitting: Emergency Medicine

## 2012-01-30 ENCOUNTER — Emergency Department (HOSPITAL_COMMUNITY)
Admission: EM | Admit: 2012-01-30 | Discharge: 2012-01-30 | Disposition: A | Discharge status: Home / Self Care | Payer: Self-pay | Attending: Emergency Medicine | Admitting: Emergency Medicine

## 2012-01-30 DIAGNOSIS — S0990XA Unspecified injury of head, initial encounter: Secondary | ICD-10-CM | POA: Insufficient documentation

## 2012-01-30 DIAGNOSIS — M25562 Pain in left knee: Secondary | ICD-10-CM

## 2012-01-30 DIAGNOSIS — W010XXA Fall on same level from slipping, tripping and stumbling without subsequent striking against object, initial encounter: Secondary | ICD-10-CM | POA: Insufficient documentation

## 2012-01-30 DIAGNOSIS — M25552 Pain in left hip: Secondary | ICD-10-CM

## 2012-01-30 DIAGNOSIS — W19XXXA Unspecified fall, initial encounter: Secondary | ICD-10-CM

## 2012-01-30 DIAGNOSIS — M25569 Pain in unspecified knee: Secondary | ICD-10-CM | POA: Insufficient documentation

## 2012-01-30 DIAGNOSIS — M25559 Pain in unspecified hip: Secondary | ICD-10-CM | POA: Insufficient documentation

## 2012-01-30 DIAGNOSIS — R51 Headache: Secondary | ICD-10-CM | POA: Insufficient documentation

## 2012-01-30 MED ORDER — NAPROXEN 250 MG PO TABS
500.0000 mg | ORAL_TABLET | Freq: Once | ORAL | Status: AC
  Administered 2012-01-30: 500 mg via ORAL
  Filled 2012-01-30: qty 2

## 2012-01-30 MED ORDER — CYCLOBENZAPRINE HCL 10 MG PO TABS
10.0000 mg | ORAL_TABLET | Freq: Two times a day (BID) | ORAL | Status: AC | PRN
Start: 2012-01-30 — End: 2012-02-09

## 2012-01-30 MED ORDER — NAPROXEN 500 MG PO TABS: 500.0000 mg | ORAL_TABLET | Freq: Two times a day (BID) | ORAL | Status: DC

## 2012-01-30 NOTE — ED Provider Notes (Signed)
History     CSN: [id number]  Arrival date & time 01/30/12  1191   First MD Initiated Contact with Patient 01/30/12 1918      Chief Complaint  Patient presents with  . Fall    (Consider location/radiation/quality/duration/timing/severity/associated sxs/prior treatment) HPI.Marland Kitchenpatient accidentally fell backward while assisting her husband the bathroom. She struck the back or head on a table. Additionally she complains of left hip and left knee pain.  Loss of consciousness or neurological deficits. Movement makes pain worse. Pain is moderate. Described as sharp  Past Medical History  Diagnosis Date  . Arthritis   . Bronchitis     Past Surgical History  Procedure Date  . Knee surgery   . Appendectomy   . Tonsillectomy     Family History  Problem Relation Age of Onset  . Heart failure Father   . Diabetes Father   . Kidney failure Father     History  Substance Use Topics  . Smoking status: Current Everyday Smoker -- 0.5 packs/day  . Smokeless tobacco: Not on file  . Alcohol Use: No    OB History    Grav Para Term Preterm Abortions TAB SAB Ect Mult Living   2 2 2              Review of Systems  All other systems reviewed and are negative.    Allergies  Bee venom; Amitriptyline; and Tramadol  Home Medications   Current Outpatient Rx  Name Route Sig Dispense Refill  . ACETAMINOPHEN 500 MG PO TABS Oral Take 1,000 mg by mouth daily as needed. For pain      Ht 5\' 4"  (1.626 m)  Wt 220 lb (99.791 kg)  BMI 37.76 kg/m2  Physical Exam  Nursing note and vitals reviewed. Constitutional: She is oriented to person, place, and time. She appears well-developed and well-nourished.  HENT:  Head: Normocephalic.       Tender in occipital area  Eyes: Conjunctivae and EOM are normal. Pupils are equal, round, and reactive to light.  Neck: Normal range of motion. Neck supple.       No cervical tenderness  Cardiovascular: Normal rate and regular rhythm.   Pulmonary/Chest:  Effort normal and breath sounds normal.  Abdominal: Soft. Bowel sounds are normal.  Musculoskeletal: Normal range of motion.       Tender in left posterior hip and left anterior knee. Pain with range of motion  Neurological: She is alert and oriented to person, place, and time.  Skin: Skin is warm and dry.  Psychiatric: She has a normal mood and affect.    ED Course  Procedures (including critical care time)  Labs Reviewed - No data to display Dg Hip Complete Left  01/30/2012  *RADIOLOGY REPORT*  Clinical Data: Fall with left knee and hip pain.  LEFT HIP - COMPLETE 2+ VIEW  Comparison: 08/01/2010  Findings: Both femoral heads are located.  Mild joint space narrowing involving the weightbearing surface of bilateral hips. No acute fracture.  IMPRESSION: No acute osseous abnormality.  Original Report Authenticated By: Consuello Bossier, M.D.   Dg Knee Complete 4 Views Left  01/30/2012  *RADIOLOGY REPORT*  Clinical Data: Pain after fall  LEFT KNEE - COMPLETE 4+ VIEW  Comparison: 08/01/2010  Findings: Mild medial and mild to moderate lateral compartment joint space narrowing osteophyte formation.  Moderate patellofemoral osteophyte formation.  No definite joint effusion. Enthesopathic change at the quadriceps insertion and patellar tendon origin. No acute fracture or dislocation.  Clothing artifact  projects over the knee on the oblique images.  IMPRESSION: Osteoarthritis. No acute osseous abnormality.  Original Report Authenticated By: Consuello Bossier, M.D.     No diagnosis found.    MDM  CT head, plain films of left hip and left knee  2100:  X-rayed reviewed. No fracture. No neurological deficits.      Donnetta Hutching, MD 01/30/12 2103

## 2012-01-30 NOTE — Discharge Instructions (Signed)
X-rays were normal. You'll be sore for several days. Medications for pain and muscle spasm. Ice pack to sore areas

## 2012-01-30 NOTE — ED Notes (Signed)
Pt reports she was following her husband to bathroom, foot slipped and she fell backwards. Pt hit her head on a play table, hit her arm and knee on the door frame.

## 2012-01-30 NOTE — ED Notes (Signed)
Pt denies loss of consciousness, no blurry vision. Pt states she does have a headache. Pupils equal and reactive. Pt alert and oriented.

## 2012-01-30 NOTE — ED Notes (Signed)
Patient transported to X-ray. NAD noted. Pt moving all extremities.

## 2012-03-04 ENCOUNTER — Emergency Department (HOSPITAL_COMMUNITY): Payer: Medicaid Other

## 2012-03-04 ENCOUNTER — Inpatient Hospital Stay (HOSPITAL_COMMUNITY)
Admission: EM | Admit: 2012-03-04 | Discharge: 2012-03-07 | DRG: 192 | Disposition: A | Discharge status: Home / Self Care | Payer: Medicaid Other | Attending: Internal Medicine | Admitting: Internal Medicine

## 2012-03-04 ENCOUNTER — Encounter (HOSPITAL_COMMUNITY): Payer: Self-pay | Admitting: *Deleted

## 2012-03-04 DIAGNOSIS — J209 Acute bronchitis, unspecified: Secondary | ICD-10-CM

## 2012-03-04 DIAGNOSIS — J441 Chronic obstructive pulmonary disease with (acute) exacerbation: Principal | ICD-10-CM | POA: Diagnosis present

## 2012-03-04 DIAGNOSIS — J40 Bronchitis, not specified as acute or chronic: Secondary | ICD-10-CM | POA: Diagnosis present

## 2012-03-04 DIAGNOSIS — J449 Chronic obstructive pulmonary disease, unspecified: Secondary | ICD-10-CM | POA: Diagnosis present

## 2012-03-04 DIAGNOSIS — R059 Cough, unspecified: Secondary | ICD-10-CM | POA: Diagnosis present

## 2012-03-04 DIAGNOSIS — R0602 Shortness of breath: Secondary | ICD-10-CM | POA: Diagnosis present

## 2012-03-04 DIAGNOSIS — R05 Cough: Secondary | ICD-10-CM | POA: Diagnosis present

## 2012-03-04 DIAGNOSIS — Z720 Tobacco use: Secondary | ICD-10-CM | POA: Diagnosis present

## 2012-03-04 DIAGNOSIS — F17200 Nicotine dependence, unspecified, uncomplicated: Secondary | ICD-10-CM

## 2012-03-04 DIAGNOSIS — F430 Acute stress reaction: Secondary | ICD-10-CM | POA: Diagnosis present

## 2012-03-04 DIAGNOSIS — J9801 Acute bronchospasm: Secondary | ICD-10-CM

## 2012-03-04 LAB — BASIC METABOLIC PANEL
GFR calc Af Amer: >90 mL/min (ref >90)
GFR calc non Af Amer: >90 mL/min (ref >90)
Glucose, Bld: 95 mg/dL (ref 70–99)
Potassium: 4.3 mEq/L (ref 3.5–5.1)
Sodium: 140 mEq/L (ref 135–145)

## 2012-03-04 LAB — CBC
Hemoglobin: 15.7 g/dL — ABNORMAL HIGH (ref 12.0–15.0)
MCHC: 32.8 g/dL (ref 30.0–36.0)
RBC: 5.33 MIL/uL — ABNORMAL HIGH (ref 3.87–5.11)
WBC: 17.4 10*3/uL — ABNORMAL HIGH (ref 4.0–10.5)

## 2012-03-04 LAB — DIFFERENTIAL
Basophils Relative: 0 % (ref 0–1)
Eosinophils Absolute: 0.3 10*3/uL (ref 0.0–0.7)
Eosinophils Relative: 2 % (ref 0–5)
Lymphocytes Relative: 42 % (ref 12–46)
Neutrophils Relative %: 47 % (ref 43–77)

## 2012-03-04 MED ORDER — ALBUTEROL SULFATE (5 MG/ML) 0.5% IN NEBU
2.5000 mg | INHALATION_SOLUTION | Freq: Four times a day (QID) | RESPIRATORY_TRACT | Status: DC
  Administered 2012-03-05 – 2012-03-07 (×10): 2.5 mg via RESPIRATORY_TRACT
  Filled 2012-03-04 (×10): qty 0.5

## 2012-03-04 MED ORDER — SODIUM CHLORIDE 0.9 % IV SOLN: INTRAVENOUS | Status: DC

## 2012-03-04 MED ORDER — ALBUTEROL SULFATE (5 MG/ML) 0.5% IN NEBU: 2.5000 mg | INHALATION_SOLUTION | Freq: Once | RESPIRATORY_TRACT | Status: DC

## 2012-03-04 MED ORDER — SODIUM CHLORIDE 0.9 % IV SOLN: 250.0000 mL | INTRAVENOUS | Status: DC | PRN

## 2012-03-04 MED ORDER — SODIUM CHLORIDE 0.9 % IJ SOLN: 3.0000 mL | INTRAMUSCULAR | Status: DC | PRN

## 2012-03-04 MED ORDER — ONDANSETRON HCL 4 MG/2ML IJ SOLN
4.0000 mg | Freq: Once | INTRAMUSCULAR | Status: AC
  Administered 2012-03-04: 4 mg via INTRAVENOUS
  Filled 2012-03-04: qty 2

## 2012-03-04 MED ORDER — METHYLPREDNISOLONE SODIUM SUCC 125 MG IJ SOLR
125.0000 mg | INTRAMUSCULAR | Status: AC
  Administered 2012-03-04: 125 mg via INTRAVENOUS
  Filled 2012-03-04: qty 2

## 2012-03-04 MED ORDER — OXYCODONE-ACETAMINOPHEN 5-325 MG PO TABS
1.0000 | ORAL_TABLET | Freq: Once | ORAL | Status: AC
  Administered 2012-03-04: 1 via ORAL
  Filled 2012-03-04: qty 1

## 2012-03-04 MED ORDER — ALBUTEROL SULFATE (5 MG/ML) 0.5% IN NEBU
2.5000 mg | INHALATION_SOLUTION | Freq: Once | RESPIRATORY_TRACT | Status: AC
  Administered 2012-03-04: 2.5 mg via RESPIRATORY_TRACT
  Filled 2012-03-04: qty 0.5

## 2012-03-04 MED ORDER — SODIUM CHLORIDE 0.9 % IJ SOLN
3.0000 mL | Freq: Two times a day (BID) | INTRAMUSCULAR | Status: DC
  Filled 2012-03-04 (×3): qty 3

## 2012-03-04 MED ORDER — IPRATROPIUM BROMIDE 0.02 % IN SOLN
0.5000 mg | Freq: Four times a day (QID) | RESPIRATORY_TRACT | Status: DC
  Administered 2012-03-05 – 2012-03-07 (×10): 0.5 mg via RESPIRATORY_TRACT
  Filled 2012-03-04 (×10): qty 2.5

## 2012-03-04 MED ORDER — IPRATROPIUM BROMIDE 0.02 % IN SOLN
0.5000 mg | Freq: Once | RESPIRATORY_TRACT | Status: AC
  Administered 2012-03-04: 0.5 mg via RESPIRATORY_TRACT
  Filled 2012-03-04: qty 2.5

## 2012-03-04 MED ORDER — ALBUTEROL SULFATE (5 MG/ML) 0.5% IN NEBU: 2.5000 mg | INHALATION_SOLUTION | RESPIRATORY_TRACT | Status: DC | PRN

## 2012-03-04 MED ORDER — METHYLPREDNISOLONE SODIUM SUCC 125 MG IJ SOLR
80.0000 mg | Freq: Two times a day (BID) | INTRAMUSCULAR | Status: DC
  Administered 2012-03-05 – 2012-03-07 (×5): 80 mg via INTRAVENOUS
  Filled 2012-03-04 (×5): qty 2

## 2012-03-04 MED ORDER — ONDANSETRON HCL 4 MG/2ML IJ SOLN: 4.0000 mg | Freq: Three times a day (TID) | INTRAMUSCULAR | Status: DC | PRN

## 2012-03-04 MED ORDER — IPRATROPIUM BROMIDE 0.02 % IN SOLN: 0.5000 mg | Freq: Once | RESPIRATORY_TRACT | Status: DC

## 2012-03-04 MED ORDER — AZITHROMYCIN 250 MG PO TABS
250.0000 mg | ORAL_TABLET | Freq: Every day | ORAL | Status: DC
  Administered 2012-03-06 – 2012-03-07 (×2): 250 mg via ORAL
  Filled 2012-03-04 (×2): qty 1

## 2012-03-04 MED ORDER — GUAIFENESIN-DM 100-10 MG/5ML PO SYRP
5.0000 mL | ORAL_SOLUTION | ORAL | Status: DC | PRN
  Administered 2012-03-06 – 2012-03-07 (×6): 5 mL via ORAL
  Filled 2012-03-04 (×6): qty 5

## 2012-03-04 MED ORDER — AZITHROMYCIN 250 MG PO TABS
500.0000 mg | ORAL_TABLET | Freq: Every day | ORAL | Status: AC
  Administered 2012-03-05: 500 mg via ORAL
  Filled 2012-03-04: qty 1

## 2012-03-04 MED ORDER — HYDROCODONE-ACETAMINOPHEN 5-325 MG PO TABS
1.0000 | ORAL_TABLET | ORAL | Status: DC | PRN
  Administered 2012-03-04 – 2012-03-05 (×2): 2 via ORAL
  Administered 2012-03-05: 1 via ORAL
  Administered 2012-03-06 (×2): 2 via ORAL
  Administered 2012-03-06 – 2012-03-07 (×3): 1 via ORAL
  Filled 2012-03-04 (×3): qty 1
  Filled 2012-03-04: qty 2
  Filled 2012-03-04 (×2): qty 1
  Filled 2012-03-04 (×3): qty 2

## 2012-03-04 MED ORDER — SODIUM CHLORIDE 0.9 % IJ SOLN
3.0000 mL | Freq: Two times a day (BID) | INTRAMUSCULAR | Status: DC
  Administered 2012-03-04 – 2012-03-06 (×4): 3 mL via INTRAVENOUS
  Filled 2012-03-04: qty 3

## 2012-03-04 NOTE — H&P (Signed)
Chief Complaint:  Cough shortness of breath x6 days HPI: 61 year old female who's been having cough shortness of breath and worsening wheezing for asked 6 days now. She has no prior history of COPD but does have a history of asthma. She does smoke tobacco products. She's received Solu-Medrol and multiple breathing treatments in the emergency department and feels much better already. However she still having a significant amount of wheezing. She's had no recent antibiotics or steroids. She is supposed to have an albuterol inhaler at home but she ran out of this months ago. Denies any nausea vomiting or diarrhea. No chest pain.  Review of Systems:  Otherwise negative  Past Medical History: Past Medical History  Diagnosis Date  . Arthritis   . Bronchitis    Past Surgical History  Procedure Date  . Knee surgery   . Appendectomy   . Tonsillectomy     Medications: Prior to Admission medications   Medication Sig Start Date End Date Taking? Authorizing Provider  acetaminophen (TYLENOL) 500 MG tablet Take 1,000 mg by mouth daily as needed. For pain   Yes Historical Provider, MD  Pseudoeph-CPM-DM-APAP (CHILDRENS COLD PLUS COUGH PO) Take 30 mLs by mouth as needed. For cough   Yes Historical Provider, MD    Allergies:   Allergies  Allergen Reactions  . Bee Venom Swelling  . Amitriptyline     Has nightmares when using  . Tramadol Nausea And Vomiting    Social History:  reports that she has been smoking.  She does not have any smokeless tobacco history on file. She reports that she does not drink alcohol or use illicit drugs.  Family History: Family History  Problem Relation Age of Onset  . Heart failure Father   . Diabetes Father   . Kidney failure Father     Physical Exam: Filed Vitals:   03/04/12 1815 03/04/12 2000 03/04/12 2038 03/04/12 2116  BP:    139/60  Pulse:    85  Temp:      TempSrc:      Resp:    22  Weight:      SpO2: 96% 95% 94% 93%   BP 148/60  Pulse 79   Temp(Src) 98 F (36.7 C) (Oral)  Resp 22  Ht 5\' 4"  (1.626 m)  Wt 99.791 kg (220 lb)  BMI 37.76 kg/m2  SpO2 94% General appearance: alert, cooperative and no distress Lungs: wheezes bilaterally Heart: regular rate and rhythm, S1, S2 normal, no murmur, click, rub or gallop Abdomen: soft, non-tender; bowel sounds normal; no masses,  no organomegaly Extremities: extremities normal, atraumatic, no cyanosis or edema Pulses: 2+ and symmetric Skin: Skin color, texture, turgor normal. No rashes or lesions Neurologic: Grossly normal    Labs on Admission:   [facility] 03/04/12 1830  NA 140  K 4.3  CL 102  CO2 24  GLUCOSE 95  BUN 15  CREATININE 0.64  CALCIUM 9.8  MG --  PHOS --    Basename 03/04/12 1830  WBC 17.4*  NEUTROABS 8.2*  HGB 15.7*  HCT 47.8*  MCV 89.7  PLT 286    Radiological Exams on Admission: Dg Chest Portable 1 View  03/04/2012  *RADIOLOGY REPORT*  Clinical Data: Cough, shortness of breath.  PORTABLE CHEST - 1 VIEW  Comparison: 08/02/2011  Findings: Heart and mediastinal contours are within normal limits. No focal opacities or effusions.  No acute bony abnormality.  IMPRESSION: No active cardiopulmonary disease.  Original Report Authenticated By: Cyndie Chime, M.D.  Assessment/Plan Present on Admission:  61 year old female with significant bronchitis/asthma exacerbation  .SOB (shortness of breath) .Cough .Bronchitis .Tobacco abuse  Placed on Solu-Medrol frequent nebulizers and oxygen as needed. Chest x-ray is negative for infiltrate. Patient's daughter is graduating from fifth grade in the morning at 9 AM and really liked to make it however I have told her she probably will not make her daughter's graduation but would relay this onto the daytime physician. I suspect she will need probably at least 48 hours hospitalization. Further recommendations pending on response to the above treatment. We'll also placed on azithromycin.  Donnetta Gillin  [phone] 03/04/2012, 9:18 PM

## 2012-03-04 NOTE — ED Provider Notes (Signed)
History     CSN: [id number]  Arrival date & time 03/04/12  1743   First MD Initiated Contact with Patient 03/04/12 1801      Chief Complaint  Patient presents with  . Cough    (Consider location/radiation/quality/duration/timing/severity/associated sxs/prior treatment) Patient is a 61 y.o. female presenting with cough. The history is provided by the patient.  Cough  She has been sick for the last 6 days with a nonproductive cough and subjective fever. Cough has been virtually constant and has been associated with dyspnea. There's been some rhinorrhea which is sometimes clear and sometimes yellow. She has a mild sore throat. She denies sweats but has had some chills. She denies arthralgias and myalgias. There has been intermittent nausea without vomiting and she denies diarrhea. She is treated herself with Benadryl with no relief. She normally smokes 6 cigarettes a day but has not been smoking because of her coughing and dyspnea.  Past Medical History  Diagnosis Date  . Arthritis   . Bronchitis     Past Surgical History  Procedure Date  . Knee surgery   . Appendectomy   . Tonsillectomy     Family History  Problem Relation Age of Onset  . Heart failure Father   . Diabetes Father   . Kidney failure Father     History  Substance Use Topics  . Smoking status: Current Everyday Smoker -- 0.5 packs/day  . Smokeless tobacco: Not on file  . Alcohol Use: No    OB History    Grav Para Term Preterm Abortions TAB SAB Ect Mult Living   2 2 2              Review of Systems  Respiratory: Positive for cough.   Genitourinary:       Stress incontinence  All other systems reviewed and are negative.    Allergies  Bee venom; Amitriptyline; and Tramadol  Home Medications   Current Outpatient Rx  Name Route Sig Dispense Refill  . ACETAMINOPHEN 500 MG PO TABS Oral Take 1,000 mg by mouth daily as needed. For pain    . NAPROXEN 500 MG PO TABS Oral Take 1 tablet (500 mg total)  by mouth 2 (two) times daily. 20 tablet 0    BP 164/115  Pulse 83  Temp(Src) 98.2 F (36.8 C) (Oral)  Resp 24  Wt 220 lb (99.791 kg)  SpO2 98%  Physical Exam  Nursing note and vitals reviewed.  60 year old female who is hyperventilating and appears uncomfortable. She's not using accessory muscles of respiration. Vital signs are significant for tachypnea with respiratory rate of 24 and hypertension with blood pressure 164/115. Oxygen saturation is 98% which is normal. Head is normocephalic and atraumatic. There's no sinus tenderness. PERRLA, EOMI. Oropharynx is clear. Neck is nontender and supple without adenopathy or JVD. Back is nontender. Lungs have diffuse expiratory wheezes without rales or rhonchi. There is no chest wall tenderness. Heart has regular rate and without murmur. Abdomen is soft, flat, nontender without masses or hepatosplenomegaly. Extremities have trace edema, no cyanosis. Full range of motion is present. Skin is warm and dry without rash. Neurologic: Mental status is normal, cranial nerves are intact, there are no motor or sensory deficits.  ED Course  Procedures (including critical care time)   Labs Reviewed  CBC  DIFFERENTIAL  BASIC METABOLIC PANEL   No results found.   1. Bronchitis   2. Bronchospasm     CRITICAL CARE Performed by: [name]   Total critical  care time: 45 minutes  Critical care time was exclusive of separately billable procedures and treating other patients.  Critical care was necessary to treat or prevent imminent or life-threatening deterioration.  Critical care was time spent personally by me on the following activities: development of treatment plan with patient and/or surrogate as well as nursing, discussions with consultants, evaluation of patient's response to treatment, examination of patient, obtaining history from patient or surrogate, ordering and performing treatments and interventions, ordering and review of laboratory  studies, ordering and review of radiographic studies, pulse oximetry and re-evaluation of patient's condition.   MDM  Acute bronchitis with bronchospasm. Chest x-ray will be obtained to rule out pneumonia and she will be treated with steroids and albuterol with Atrovent.  Chest x-ray does not show evidence of pneumonia. She was given an albuterol with Atrovent treatment and stated that she was feeling significantly better but there was no change on her exam. She is given a second albuterol with Atrovent nebulizer treatment.  After her second treatment, she states that she did not necessarily feeling better but wheezing was slightly less pronounced. She is still dyspneic and tachypnea. She will be given a third albuterol with Atrovent nebulizer treatment.  There is slight improvement after her third nebulizer treatment. Respiratory rate is slightly slower and she doesn't seem as anxious. Wheezing has decreased but is still moderate. She has not improved sufficiently to go home safely. Consultation will be made with [facility] to evaluate for admission.  Case is discussed with Dr. Onalee Hua who agrees to admit the patient.    [name], MD 03/04/12 2117

## 2012-03-04 NOTE — ED Notes (Signed)
Almost constant coughing for 5 days,feels tired

## 2012-03-05 DIAGNOSIS — J209 Acute bronchitis, unspecified: Secondary | ICD-10-CM

## 2012-03-05 DIAGNOSIS — R05 Cough: Secondary | ICD-10-CM

## 2012-03-05 DIAGNOSIS — J449 Chronic obstructive pulmonary disease, unspecified: Secondary | ICD-10-CM | POA: Diagnosis present

## 2012-03-05 DIAGNOSIS — R0602 Shortness of breath: Secondary | ICD-10-CM

## 2012-03-05 DIAGNOSIS — R059 Cough, unspecified: Secondary | ICD-10-CM

## 2012-03-05 DIAGNOSIS — F17200 Nicotine dependence, unspecified, uncomplicated: Secondary | ICD-10-CM

## 2012-03-05 LAB — CBC
HCT: 48.1 % — ABNORMAL HIGH (ref 36.0–46.0)
RDW: 13.8 % (ref 11.5–15.5)
WBC: 13.4 10*3/uL — ABNORMAL HIGH (ref 4.0–10.5)

## 2012-03-05 LAB — BASIC METABOLIC PANEL
Chloride: 103 mEq/L (ref 96–112)
Creatinine, Ser: 0.49 mg/dL — ABNORMAL LOW (ref 0.50–1.10)
GFR calc Af Amer: >90 mL/min (ref >90)
Potassium: 4.1 mEq/L (ref 3.5–5.1)

## 2012-03-05 MED ORDER — GUAIFENESIN ER 600 MG PO TB12
1200.0000 mg | ORAL_TABLET | Freq: Two times a day (BID) | ORAL | Status: DC
  Administered 2012-03-05 – 2012-03-07 (×4): 1200 mg via ORAL
  Filled 2012-03-05 (×4): qty 2

## 2012-03-05 NOTE — Progress Notes (Signed)
UR Chart Review Completed  

## 2012-03-05 NOTE — Progress Notes (Signed)
Subjective: Patient still continues to wheeze and is short of breath with coughing.  Objective: Vital signs in last 24 hours: Temp:  [97.5 F (36.4 C)-98.2 F (36.8 C)] 97.5 F (36.4 C) (05/24 1531) Pulse Rate:  [73-85] 73  (05/24 1531) Resp:  [20-24] 20  (05/24 1531) BP: (114-164)/(49-115) 136/72 mmHg (05/24 1531) SpO2:  [93 %-98 %] 95 % (05/24 1531) FiO2 (%):  [28 %] 28 % (05/23 2330) Weight:  [99.791 kg (220 lb)] 99.791 kg (220 lb) (05/23 2300) Weight change:  Last BM Date: 03/04/12  Intake/Output from previous day:   Total I/O In: 220 [P.O.:220] Out: -    Physical Exam: General: Alert, awake, oriented x3, in no acute distress. HEENT: No bruits, no goiter. Heart: Regular rate and rhythm, without murmurs, rubs, gallops. Lungs: b/l exp wheezing Abdomen: Soft, nontender, nondistended, positive bowel sounds. Extremities: No clubbing cyanosis or edema with positive pedal pulses. Neuro: Grossly intact, nonfocal.    Lab Results: Basic Metabolic Panel:  Basename 03/05/12 0650 03/04/12 1830  NA 138 140  K 4.1 4.3  CL 103 102  CO2 24 24  GLUCOSE 179* 95  BUN 14 15  CREATININE 0.49* 0.64  CALCIUM 9.7 9.8  MG -- --  PHOS -- --   Liver Function Tests: No results found for this basename: AST:2,ALT:2,ALKPHOS:2,BILITOT:2,PROT:2,ALBUMIN:2 in the last 72 hours No results found for this basename: LIPASE:2,AMYLASE:2 in the last 72 hours No results found for this basename: AMMONIA:2 in the last 72 hours CBC:  Basename 03/05/12 0650 03/04/12 1830  WBC 13.4* 17.4*  NEUTROABS -- 8.2*  HGB 15.5* 15.7*  HCT 48.1* 47.8*  MCV 90.6 89.7  PLT 274 286   Cardiac Enzymes: No results found for this basename: CKTOTAL:3,CKMB:3,CKMBINDEX:3,TROPONINI:3 in the last 72 hours BNP: No results found for this basename: PROBNP:3 in the last 72 hours D-Dimer: No results found for this basename: DDIMER:2 in the last 72 hours CBG: No results found for this basename: GLUCAP:6 in the last 72  hours Hemoglobin A1C: No results found for this basename: HGBA1C in the last 72 hours Fasting Lipid Panel: No results found for this basename: CHOL,HDL,LDLCALC,TRIG,CHOLHDL,LDLDIRECT in the last 72 hours Thyroid Function Tests: No results found for this basename: TSH,T4TOTAL,FREET4,T3FREE,THYROIDAB in the last 72 hours Anemia Panel: No results found for this basename: VITAMINB12,FOLATE,FERRITIN,TIBC,IRON,RETICCTPCT in the last 72 hours Coagulation: No results found for this basename: LABPROT:2,INR:2 in the last 72 hours Urine Drug Screen: Drugs of Abuse  No results found for this basename: labopia, cocainscrnur, labbenz, amphetmu, thcu, labbarb    Alcohol Level: No results found for this basename: ETH:2 in the last 72 hours Urinalysis: No results found for this basename: COLORURINE:2,APPERANCEUR:2,LABSPEC:2,PHURINE:2,GLUCOSEU:2,HGBUR:2,BILIRUBINUR:2,KETONESUR:2,PROTEINUR:2,UROBILINOGEN:2,NITRITE:2,LEUKOCYTESUR:2 in the last 72 hours  No results found for this or any previous visit (from the past 240 hour(s)).  Studies/Results: Dg Chest Portable 1 View  03/04/2012  *RADIOLOGY REPORT*  Clinical Data: Cough, shortness of breath.  PORTABLE CHEST - 1 VIEW  Comparison: 08/02/2011  Findings: Heart and mediastinal contours are within normal limits. No focal opacities or effusions.  No acute bony abnormality.  IMPRESSION: No active cardiopulmonary disease.  Original Report Authenticated By: Cyndie Chime, M.D.    Medications: Scheduled Meds:   . albuterol  2.5 mg Nebulization Once  . albuterol  2.5 mg Nebulization Once  . albuterol  2.5 mg Nebulization Once  . albuterol  2.5 mg Nebulization Q6H  . azithromycin  500 mg Oral Daily   Followed by  . azithromycin  250 mg Oral Daily  .  ipratropium  0.5 mg Nebulization Once  . ipratropium  0.5 mg Nebulization Once  . ipratropium  0.5 mg Nebulization Once  . ipratropium  0.5 mg Nebulization Q6H  . methylPREDNISolone (SOLU-MEDROL) injection   125 mg Intravenous STAT  . methylPREDNISolone (SOLU-MEDROL) injection  80 mg Intravenous Q12H  . ondansetron  4 mg Intravenous Once  . oxyCODONE-acetaminophen  1 tablet Oral Once  . sodium chloride  3 mL Intravenous Q12H  . sodium chloride  3 mL Intravenous Q12H  . DISCONTD: sodium chloride   Intravenous STAT  . DISCONTD: albuterol  2.5 mg Nebulization Once  . DISCONTD: ipratropium  0.5 mg Nebulization Once   Continuous Infusions:  PRN Meds:.sodium chloride, albuterol, guaiFENesin-dextromethorphan, HYDROcodone-acetaminophen, sodium chloride, DISCONTD: albuterol, DISCONTD: ondansetron (ZOFRAN) IV  Assessment/Plan:  Principal Problem:  *SOB (shortness of breath) Active Problems:  Cough  Bronchitis  Tobacco abuse  COPD (chronic obstructive pulmonary disease)  Plan:  Continue with IV steroids and nebs since she is still actively wheezing Continue abx She was advised to quit smoking Anticipate she will be ready for discharge in the next 2-3 days   LOS: 1 day   Preethi Scantlebury [facility] Pager: [phone] 03/05/2012, 5:40 PM

## 2012-03-06 DIAGNOSIS — R059 Cough, unspecified: Secondary | ICD-10-CM

## 2012-03-06 DIAGNOSIS — F17200 Nicotine dependence, unspecified, uncomplicated: Secondary | ICD-10-CM

## 2012-03-06 DIAGNOSIS — R05 Cough: Secondary | ICD-10-CM

## 2012-03-06 DIAGNOSIS — J209 Acute bronchitis, unspecified: Secondary | ICD-10-CM

## 2012-03-06 DIAGNOSIS — R0602 Shortness of breath: Secondary | ICD-10-CM

## 2012-03-06 LAB — CBC
HCT: 48.3 % — ABNORMAL HIGH (ref 36.0–46.0)
Hemoglobin: 15.7 g/dL — ABNORMAL HIGH (ref 12.0–15.0)
MCH: 29.6 pg (ref 26.0–34.0)
MCHC: 32.5 g/dL (ref 30.0–36.0)
MCV: 91 fL (ref 78.0–100.0)
RBC: 5.31 MIL/uL — ABNORMAL HIGH (ref 3.87–5.11)

## 2012-03-06 MED ORDER — FLUTICASONE-SALMETEROL 500-50 MCG/DOSE IN AEPB
1.0000 | INHALATION_SPRAY | Freq: Two times a day (BID) | RESPIRATORY_TRACT | Status: DC
  Administered 2012-03-06 – 2012-03-07 (×3): 1 via RESPIRATORY_TRACT
  Filled 2012-03-06: qty 14

## 2012-03-06 NOTE — Progress Notes (Signed)
Pt's O2 at 2 LPM via [place] removed per MD request. Pt on RA for approximately 10 minutes when O2 saturation obtained and measured 89%, however this was after the pt had a coughing spell. Once the patient caught her breath, the patient stated she would like another five minutes to see what the O2 saturation would then read. After 5 minutes, obtained another O2 saturation measuring 93% on RA. Pt left on RA at this time with the instructions to call RN if she became SOB and felt that she was not receiving enough O2. Pt verbalized understanding. Will continue to monitor.

## 2012-03-06 NOTE — Progress Notes (Signed)
Found pt back on 2lpm/[place] o2
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
## 2012-03-06 NOTE — Progress Notes (Signed)
Patient c/o midsternal chest pain that radiates down the back of her left arm, describes as a burning sensation, rate 8-9/10, some shortness of breath but has been coughing forcefully, telemetry reading of SR, obtained STAT EKG with the results of NSR, VSS BP 173/77, HR 80, O2 sat of 95% on 2L, RR 18, patient given Norco 5/325mg  2 tabs and Robitussin DM 5ml po, will continue to monitor patient at this time - made patient's nurse aware of this event.

## 2012-03-07 DIAGNOSIS — R0602 Shortness of breath: Secondary | ICD-10-CM

## 2012-03-07 DIAGNOSIS — R059 Cough, unspecified: Secondary | ICD-10-CM

## 2012-03-07 DIAGNOSIS — R05 Cough: Secondary | ICD-10-CM

## 2012-03-07 DIAGNOSIS — F17200 Nicotine dependence, unspecified, uncomplicated: Secondary | ICD-10-CM

## 2012-03-07 DIAGNOSIS — J209 Acute bronchitis, unspecified: Secondary | ICD-10-CM

## 2012-03-07 MED ORDER — FLUTICASONE PROPIONATE HFA 220 MCG/ACT IN AERO: 1.0000 | INHALATION_SPRAY | Freq: Two times a day (BID) | RESPIRATORY_TRACT | Status: DC

## 2012-03-07 MED ORDER — PREDNISONE 20 MG PO TABS: ORAL_TABLET | ORAL | Status: DC

## 2012-03-07 MED ORDER — AZITHROMYCIN 500 MG PO TABS: 500.0000 mg | ORAL_TABLET | Freq: Every day | ORAL | Status: DC

## 2012-03-07 MED ORDER — ALBUTEROL SULFATE HFA 108 (90 BASE) MCG/ACT IN AERS: 2.0000 | INHALATION_SPRAY | Freq: Four times a day (QID) | RESPIRATORY_TRACT | Status: DC | PRN

## 2012-03-07 NOTE — Progress Notes (Signed)
Pt discharged home today per Dr. Karilyn Cota. Pt's IV site D/C'd and WNL. Pt's VS stable at this time. Pt provided with home medication list, discharge instructions and prescriptions. Pt verbalized understanding. Pt instructed to call to hospital tomorrow morning to speak with case managers about vouchers for prescriptions as she is unable to pay for those prescribe today. Pt given Advair Inhaler for home use. Pt verbalized understanding. Pt left floor via WC in stable condition accompanied by NT.
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
## 2012-03-08 ENCOUNTER — Encounter (HOSPITAL_COMMUNITY): Payer: Self-pay | Admitting: *Deleted

## 2012-03-08 ENCOUNTER — Other Ambulatory Visit: Payer: Self-pay

## 2012-03-08 ENCOUNTER — Observation Stay (HOSPITAL_COMMUNITY)
Admission: EM | Admit: 2012-03-08 | Discharge: 2012-03-09 | DRG: 192 | Disposition: A | Discharge status: Home / Self Care | Payer: Medicaid Other | Attending: Internal Medicine | Admitting: Internal Medicine

## 2012-03-08 ENCOUNTER — Emergency Department (HOSPITAL_COMMUNITY): Payer: Medicaid Other

## 2012-03-08 DIAGNOSIS — F411 Generalized anxiety disorder: Secondary | ICD-10-CM

## 2012-03-08 DIAGNOSIS — J438 Other emphysema: Secondary | ICD-10-CM

## 2012-03-08 DIAGNOSIS — R05 Cough: Secondary | ICD-10-CM | POA: Insufficient documentation

## 2012-03-08 DIAGNOSIS — Z720 Tobacco use: Secondary | ICD-10-CM | POA: Diagnosis present

## 2012-03-08 DIAGNOSIS — F430 Acute stress reaction: Secondary | ICD-10-CM | POA: Diagnosis present

## 2012-03-08 DIAGNOSIS — M129 Arthropathy, unspecified: Secondary | ICD-10-CM | POA: Diagnosis present

## 2012-03-08 DIAGNOSIS — R0602 Shortness of breath: Secondary | ICD-10-CM | POA: Insufficient documentation

## 2012-03-08 DIAGNOSIS — R059 Cough, unspecified: Secondary | ICD-10-CM | POA: Insufficient documentation

## 2012-03-08 DIAGNOSIS — J449 Chronic obstructive pulmonary disease, unspecified: Secondary | ICD-10-CM | POA: Diagnosis present

## 2012-03-08 DIAGNOSIS — F410 Panic disorder [episodic paroxysmal anxiety] without agoraphobia: Secondary | ICD-10-CM | POA: Diagnosis present

## 2012-03-08 DIAGNOSIS — F17200 Nicotine dependence, unspecified, uncomplicated: Secondary | ICD-10-CM | POA: Diagnosis present

## 2012-03-08 DIAGNOSIS — J441 Chronic obstructive pulmonary disease with (acute) exacerbation: Principal | ICD-10-CM | POA: Diagnosis present

## 2012-03-08 LAB — COMPREHENSIVE METABOLIC PANEL
ALT: 67 U/L — ABNORMAL HIGH (ref 0–35)
AST: 32 U/L (ref 0–37)
CO2: 27 mEq/L (ref 19–32)
Chloride: 99 mEq/L (ref 96–112)
Creatinine, Ser: 0.67 mg/dL (ref 0.50–1.10)
GFR calc Af Amer: >90 mL/min (ref >90)
GFR calc non Af Amer: >90 mL/min (ref >90)
Glucose, Bld: 131 mg/dL — ABNORMAL HIGH (ref 70–99)
Total Bilirubin: 0.3 mg/dL (ref 0.3–1.2)

## 2012-03-08 LAB — DIFFERENTIAL
Eosinophils Absolute: 0 10*3/uL (ref 0.0–0.7)
Lymphs Abs: 9.8 10*3/uL — ABNORMAL HIGH (ref 0.7–4.0)
Monocytes Absolute: 1.6 10*3/uL — ABNORMAL HIGH (ref 0.1–1.0)
Monocytes Relative: 8 % (ref 3–12)
Neutrophils Relative %: 45 % (ref 43–77)

## 2012-03-08 LAB — BLOOD GAS, ARTERIAL
Acid-Base Excess: 5.3 mmol/L — ABNORMAL HIGH (ref 0.0–2.0)
Bicarbonate: 28.2 mEq/L — ABNORMAL HIGH (ref 20.0–24.0)
O2 Saturation: 90 %
Patient temperature: 37
TCO2: 23.6 mmol/L (ref 0–100)
pO2, Arterial: 51.5 mmHg — ABNORMAL LOW (ref 80.0–100.0)

## 2012-03-08 LAB — CBC
HCT: 47.4 % — ABNORMAL HIGH (ref 36.0–46.0)
Hemoglobin: 15.8 g/dL — ABNORMAL HIGH (ref 12.0–15.0)
MCH: 29.7 pg (ref 26.0–34.0)
MCHC: 33.3 g/dL (ref 30.0–36.0)
MCV: 89.1 fL (ref 78.0–100.0)
RBC: 5.32 MIL/uL — ABNORMAL HIGH (ref 3.87–5.11)

## 2012-03-08 MED ORDER — IOHEXOL 350 MG/ML SOLN
100.0000 mL | Freq: Once | INTRAVENOUS | Status: AC | PRN
  Administered 2012-03-08: 100 mL via INTRAVENOUS

## 2012-03-08 MED ORDER — FLUTICASONE PROPIONATE HFA 220 MCG/ACT IN AERO: 1.0000 | INHALATION_SPRAY | Freq: Two times a day (BID) | RESPIRATORY_TRACT | Status: DC

## 2012-03-08 MED ORDER — SODIUM CHLORIDE 0.9 % IV SOLN
INTRAVENOUS | Status: DC
  Administered 2012-03-08: 21:00:00 via INTRAVENOUS

## 2012-03-08 MED ORDER — LORAZEPAM 0.5 MG PO TABS
0.5000 mg | ORAL_TABLET | Freq: Three times a day (TID) | ORAL | Status: DC
  Administered 2012-03-08 – 2012-03-09 (×3): 0.5 mg via ORAL
  Filled 2012-03-08 (×3): qty 1

## 2012-03-08 MED ORDER — ALBUTEROL SULFATE (5 MG/ML) 0.5% IN NEBU
5.0000 mg | INHALATION_SOLUTION | Freq: Once | RESPIRATORY_TRACT | Status: AC
  Administered 2012-03-08: 5 mg via RESPIRATORY_TRACT
  Filled 2012-03-08: qty 1

## 2012-03-08 MED ORDER — ALBUTEROL SULFATE (5 MG/ML) 0.5% IN NEBU
2.5000 mg | INHALATION_SOLUTION | RESPIRATORY_TRACT | Status: DC
  Administered 2012-03-09 (×3): 2.5 mg via RESPIRATORY_TRACT
  Filled 2012-03-08 (×3): qty 0.5

## 2012-03-08 MED ORDER — METHYLPREDNISOLONE SODIUM SUCC 125 MG IJ SOLR
125.0000 mg | Freq: Once | INTRAMUSCULAR | Status: AC
  Administered 2012-03-08: 125 mg via INTRAVENOUS
  Filled 2012-03-08: qty 2

## 2012-03-08 MED ORDER — AZITHROMYCIN 250 MG PO TABS
500.0000 mg | ORAL_TABLET | Freq: Every day | ORAL | Status: DC
  Administered 2012-03-08 – 2012-03-09 (×2): 500 mg via ORAL
  Filled 2012-03-08 (×2): qty 2

## 2012-03-08 MED ORDER — HEPARIN SODIUM (PORCINE) 5000 UNIT/ML IJ SOLN
5000.0000 [IU] | Freq: Three times a day (TID) | INTRAMUSCULAR | Status: DC
  Administered 2012-03-08 – 2012-03-09 (×2): 5000 [IU] via SUBCUTANEOUS
  Filled 2012-03-08 (×2): qty 1

## 2012-03-08 MED ORDER — ACETAMINOPHEN 500 MG PO TABS
1000.0000 mg | ORAL_TABLET | Freq: Every day | ORAL | Status: DC | PRN
  Administered 2012-03-09: 1000 mg via ORAL
  Filled 2012-03-08: qty 2

## 2012-03-08 MED ORDER — ONDANSETRON HCL 4 MG/2ML IJ SOLN
4.0000 mg | Freq: Four times a day (QID) | INTRAMUSCULAR | Status: DC | PRN
  Administered 2012-03-08: 4 mg via INTRAVENOUS
  Filled 2012-03-08: qty 2

## 2012-03-08 MED ORDER — ONDANSETRON HCL 4 MG PO TABS: 4.0000 mg | ORAL_TABLET | Freq: Four times a day (QID) | ORAL | Status: DC | PRN

## 2012-03-08 MED ORDER — IPRATROPIUM BROMIDE 0.02 % IN SOLN
0.5000 mg | Freq: Once | RESPIRATORY_TRACT | Status: AC
  Administered 2012-03-08: 0.5 mg via RESPIRATORY_TRACT

## 2012-03-08 MED ORDER — IPRATROPIUM BROMIDE 0.02 % IN SOLN
0.5000 mg | Freq: Once | RESPIRATORY_TRACT | Status: AC
  Administered 2012-03-08: 0.5 mg via RESPIRATORY_TRACT
  Filled 2012-03-08: qty 2.5

## 2012-03-08 MED ORDER — MAGNESIUM SULFATE 40 MG/ML IJ SOLN
2.0000 g | Freq: Once | INTRAMUSCULAR | Status: AC
  Administered 2012-03-08: 2 g via INTRAVENOUS
  Filled 2012-03-08: qty 50

## 2012-03-08 MED ORDER — MOXIFLOXACIN HCL IN NACL 400 MG/250ML IV SOLN
400.0000 mg | Freq: Once | INTRAVENOUS | Status: AC
  Administered 2012-03-08: 400 mg via INTRAVENOUS
  Filled 2012-03-08: qty 250

## 2012-03-08 MED ORDER — POTASSIUM CHLORIDE CRYS ER 20 MEQ PO TBCR
40.0000 meq | EXTENDED_RELEASE_TABLET | Freq: Once | ORAL | Status: AC
  Administered 2012-03-08: 40 meq via ORAL
  Filled 2012-03-08: qty 2

## 2012-03-08 MED ORDER — ALBUTEROL SULFATE HFA 108 (90 BASE) MCG/ACT IN AERS: 2.0000 | INHALATION_SPRAY | Freq: Four times a day (QID) | RESPIRATORY_TRACT | Status: DC | PRN

## 2012-03-08 MED ORDER — ALBUTEROL (5 MG/ML) CONTINUOUS INHALATION SOLN: 10.0000 mg/h | INHALATION_SOLUTION | Freq: Once | RESPIRATORY_TRACT | Status: DC

## 2012-03-08 MED ORDER — IPRATROPIUM BROMIDE 0.02 % IN SOLN
0.5000 mg | Freq: Once | RESPIRATORY_TRACT | Status: DC
  Filled 2012-03-08 (×2): qty 2.5

## 2012-03-08 MED ORDER — ALBUTEROL SULFATE (5 MG/ML) 0.5% IN NEBU
2.5000 mg | INHALATION_SOLUTION | Freq: Once | RESPIRATORY_TRACT | Status: DC
  Filled 2012-03-08 (×2): qty 0.5

## 2012-03-08 MED ORDER — PREDNISONE 20 MG PO TABS
40.0000 mg | ORAL_TABLET | Freq: Every day | ORAL | Status: DC
  Administered 2012-03-09: 40 mg via ORAL
  Filled 2012-03-08: qty 2

## 2012-03-08 MED ORDER — ALBUTEROL SULFATE (5 MG/ML) 0.5% IN NEBU
2.5000 mg | INHALATION_SOLUTION | RESPIRATORY_TRACT | Status: DC | PRN
  Administered 2012-03-08 (×2): 2.5 mg via RESPIRATORY_TRACT
  Filled 2012-03-08: qty 0.5

## 2012-03-08 MED ORDER — FLUTICASONE-SALMETEROL 500-50 MCG/DOSE IN AEPB
2.0000 | INHALATION_SPRAY | Freq: Two times a day (BID) | RESPIRATORY_TRACT | Status: DC
  Administered 2012-03-08 – 2012-03-09 (×2): 2 via RESPIRATORY_TRACT
  Filled 2012-03-08: qty 14

## 2012-03-08 MED ORDER — MORPHINE SULFATE 2 MG/ML IJ SOLN
2.0000 mg | INTRAMUSCULAR | Status: DC | PRN
  Administered 2012-03-08 (×2): 2 mg via INTRAVENOUS
  Filled 2012-03-08 (×2): qty 1

## 2012-03-08 MED ORDER — ALBUTEROL SULFATE (5 MG/ML) 0.5% IN NEBU
2.5000 mg | INHALATION_SOLUTION | Freq: Once | RESPIRATORY_TRACT | Status: AC
  Administered 2012-03-08: 2.5 mg via RESPIRATORY_TRACT

## 2012-03-08 NOTE — ED Notes (Signed)
Pt being taken to x-ray now

## 2012-03-08 NOTE — ED Provider Notes (Signed)
History     CSN: [id number]  Arrival date & time 03/08/12  1242   First MD Initiated Contact with Patient 03/08/12 1311      Chief Complaint  Patient presents with  . Shortness of Breath    (Consider location/radiation/quality/duration/timing/severity/associated sxs/prior treatment) HPI Comments: Patient presents with 10 days of dry cough, shortness of breath and wheezing. She was discharged in the hospital yesterday after a three-day stay for bronchitis and cough. She did not fill her prescriptions that she cannot afford them. Today she returns with worsening shortness of breath, coughing and wheezing. She denies chest pain, fever, abdominal pain nausea or vomiting. No leg pain or swelling. She denies any history of COPD. She has a history of asthma and is an active smoker though has not smoked in the past week.  The history is provided by the patient. The history is limited by the condition of the patient.    Past Medical History  Diagnosis Date  . Arthritis   . Bronchitis     Past Surgical History  Procedure Date  . Knee surgery   . Appendectomy   . Tonsillectomy     Family History  Problem Relation Age of Onset  . Heart failure Father   . Diabetes Father   . Kidney failure Father     History  Substance Use Topics  . Smoking status: Former Smoker -- 0.5 packs/day    Quit date: 02/26/2012  . Smokeless tobacco: Not on file  . Alcohol Use: No    OB History    Grav Para Term Preterm Abortions TAB SAB Ect Mult Living   2 2 2              Review of Systems  Constitutional: Positive for activity change and appetite change. Negative for fever.  HENT: Positive for congestion and rhinorrhea.   Eyes: Negative for visual disturbance.  Respiratory: Positive for chest tightness and shortness of breath.   Cardiovascular: Negative for chest pain.  Gastrointestinal: Negative for nausea, vomiting and abdominal pain.  Genitourinary: Negative for dysuria and hematuria.    Musculoskeletal: Negative for back pain.  Skin: Negative for rash.  Neurological: Negative for tremors and headaches.    Allergies  Bee venom; Amitriptyline; and Tramadol  Home Medications   Current Outpatient Rx  Name Route Sig Dispense Refill  . ACETAMINOPHEN 500 MG PO TABS Oral Take 1,000 mg by mouth daily as needed. For pain    . FLUTICASONE-SALMETEROL 500-50 MCG/DOSE IN AEPB Inhalation Inhale 2 puffs into the lungs every 12 (twelve) hours.    . ALBUTEROL SULFATE HFA 108 (90 BASE) MCG/ACT IN AERS Inhalation Inhale 2 puffs into the lungs every 6 (six) hours as needed for wheezing. 1 Inhaler 0  . AZITHROMYCIN 500 MG PO TABS Oral Take 1 tablet (500 mg total) by mouth daily. 5 tablet 0  . FLUTICASONE PROPIONATE  HFA 220 MCG/ACT IN AERO Inhalation Inhale 1 puff into the lungs 2 (two) times daily. 1 Inhaler 0  . PREDNISONE 20 MG PO TABS  Take 2 tablets daily for 3 days, then 1 tablet daily for 3 days, then half tablet daily for 3 days, then STOP. 12 tablet 0  . CHILDRENS COLD PLUS COUGH PO Oral Take 30 mLs by mouth as needed. For cough      BP 118/90  Pulse 75  Temp(Src) 97.5 F (36.4 C) (Oral)  Resp 18  SpO2 100%  Physical Exam  Constitutional: She is oriented to person, place, and  time. She appears well-developed and well-nourished. She appears distressed.  HENT:  Head: Normocephalic and atraumatic.  Mouth/Throat: Oropharynx is clear and moist. No oropharyngeal exudate.  Eyes: Conjunctivae and EOM are normal. Pupils are equal, round, and reactive to light.  Neck: Normal range of motion. Neck supple.  Cardiovascular: Normal rate, regular rhythm and normal heart sounds.   No murmur heard. Pulmonary/Chest: She is in respiratory distress. She has wheezes.       Diffuse wheezing with poor air exchange bilaterally  Abdominal: Soft. There is no tenderness. There is no rebound and no guarding.  Musculoskeletal: Normal range of motion. She exhibits no edema and no tenderness.   Neurological: She is alert and oriented to person, place, and time. No cranial nerve deficit.  Skin: Skin is warm.    ED Course  Procedures (including critical care time)  Labs Reviewed  BLOOD GAS, ARTERIAL - Abnormal; Notable for the following:    pH, Arterial 7.537 (*)    pCO2 arterial 33.3 (*)    pO2, Arterial 51.5 (*)    Bicarbonate 28.2 (*)    Acid-Base Excess 5.3 (*)    Allens test (pass/fail) NOT INDICATED (*)    All other components within normal limits  CBC - Abnormal; Notable for the following:    WBC 20.7 (*)    RBC 5.32 (*)    Hemoglobin 15.8 (*)    HCT 47.4 (*)    All other components within normal limits  DIFFERENTIAL - Abnormal; Notable for the following:    Neutro Abs 9.3 (*)    Lymphocytes Relative 47 (*)    Lymphs Abs 9.8 (*)    Monocytes Absolute 1.6 (*)    All other components within normal limits  COMPREHENSIVE METABOLIC PANEL - Abnormal; Notable for the following:    Potassium 3.4 (*)    Glucose, Bld 131 (*)    BUN 24 (*)    ALT 67 (*)    All other components within normal limits  CARDIAC PANEL(CRET KIN+CKTOT+MB+TROPI) - Abnormal; Notable for the following:    CK, MB 4.2 (*)    All other components within normal limits  D-DIMER, QUANTITATIVE - Abnormal; Notable for the following:    D-Dimer, Quant 0.69 (*)    All other components within normal limits  CBC  CREATININE, SERUM   Ct Angio Chest W/cm &/or Wo Cm  03/08/2012  *RADIOLOGY REPORT*  Clinical Data: Short of breath  CT ANGIOGRAPHY CHEST  Technique:  Multidetector CT imaging of the chest using the standard protocol during bolus administration of intravenous contrast. Multiplanar reconstructed images including MIPs were obtained and reviewed to evaluate the vascular anatomy.  Contrast: OMNIPAQUE IOHEXOL 350 MG/ML SOLN  Comparison: None.  Findings: No filling defects in the pulmonary arterial tree to suggest acute pulmonary thromboembolism.  No abnormal adenopathy.  No pneumothorax.  No  pleural effusion.  Patchy left perihilar upper lobe ground-glass opacities.  IMPRESSION: No acute pulmonary thromboembolism.  Left patchy perihilar ground- glass opacities likely related to an inflammatory process.  Original Report Authenticated By: Donavan Burnet, M.D.   Dg Chest Portable 1 View  03/08/2012  *RADIOLOGY REPORT*  Clinical Data: Cough.  Shortness of breath.  PORTABLE CHEST - 1 VIEW  Comparison: 03/04/2012  Findings: Cardiac and mediastinal contours appear normal.  The lungs appear clear.  No pleural effusion is identified.  IMPRESSION:  No significant abnormality identified.  Original Report Authenticated By: Dellia Cloud, M.D.     1. COPD exacerbation  MDM  Asthma exacerbation with bronchitis, recent hospitalization with noncompliance of medications. Patient's poor air movement with diffuse wheezing speaking in short sentences. Chest x-ray is negative.  Is given nebulizers, steroids with little improvement in her symptoms. She remains dyspneic with talking. She admits to not refilling her medications on discharge yesterday. She states she has not smoked in the past week.  She started a continuous albuterol, magnesium and will be readmitted for her COPD and difficulty breathing. Blood gas shows she is hyperventilating with respiratory alkalosis.   Date: 03/08/2012  Rate: 72  Rhythm: normal sinus rhythm  QRS Axis: normal  Intervals: normal  ST/T Wave abnormalities: normal  Conduction Disutrbances:none  Narrative Interpretation:   Old EKG Reviewed: none available  CRITICAL CARE Performed by: Glynn Octave   Total critical care time: 30  Critical care time was exclusive of separately billable procedures and treating other patients.  Critical care was necessary to treat or prevent imminent or life-threatening deterioration.  Critical care was time spent personally by me on the following activities: development of treatment plan with patient and/or  surrogate as well as nursing, discussions with consultants, evaluation of patient's response to treatment, examination of patient, obtaining history from patient or surrogate, ordering and performing treatments and interventions, ordering and review of laboratory studies, ordering and review of radiographic studies, pulse oximetry and re-evaluation of patient's condition.        Glynn Octave, MD 03/08/12 [phone]
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
## 2012-03-08 NOTE — ED Notes (Signed)
Floor unable to take report at this time.

## 2012-03-08 NOTE — ED Notes (Signed)
D/C yesterday from [facility], with bronchitis, Could not afford to get her meds,  Returns with sob , cough and wheeze.
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
## 2012-03-08 NOTE — ED Notes (Signed)
Pt c/o thirst.

## 2012-03-09 DIAGNOSIS — F411 Generalized anxiety disorder: Secondary | ICD-10-CM

## 2012-03-09 DIAGNOSIS — J438 Other emphysema: Secondary | ICD-10-CM

## 2012-03-09 LAB — CBC
MCV: 89.5 fL (ref 78.0–100.0)
Platelets: 284 10*3/uL (ref 150–400)
RDW: 13.7 % (ref 11.5–15.5)
WBC: 18 10*3/uL — ABNORMAL HIGH (ref 4.0–10.5)

## 2012-03-09 LAB — COMPREHENSIVE METABOLIC PANEL
Albumin: 3.2 g/dL — ABNORMAL LOW (ref 3.5–5.2)
Alkaline Phosphatase: 107 U/L (ref 39–117)
BUN: 16 mg/dL (ref 6–23)
CO2: 27 mEq/L (ref 19–32)
Chloride: 100 mEq/L (ref 96–112)
Creatinine, Ser: 0.56 mg/dL (ref 0.50–1.10)
GFR calc Af Amer: >90 mL/min (ref >90)
GFR calc non Af Amer: >90 mL/min (ref >90)
Glucose, Bld: 157 mg/dL — ABNORMAL HIGH (ref 70–99)
Potassium: 4.6 mEq/L (ref 3.5–5.1)
Total Bilirubin: 0.2 mg/dL — ABNORMAL LOW (ref 0.3–1.2)

## 2012-03-09 MED ORDER — LORAZEPAM 0.5 MG PO TABS: 0.5000 mg | ORAL_TABLET | Freq: Two times a day (BID) | ORAL | Status: AC | PRN

## 2012-03-09 NOTE — Progress Notes (Signed)
Pt. Discharged home via private vehicle driven by friend. Currently, Pt. Voices no c/o pain or discomfort. Discharge instructions and meds reviewed with Pt. With good understanding.
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
## 2012-03-15 NOTE — Care Management Note (Unsigned)
    Page 1 of 2   03/15/2012     4:26:20 PM   CARE MANAGEMENT NOTE 03/15/2012  Patient:  Carla [name], Carla [name]   Account Number:  [id number]  Date Initiated:  03/09/2012  Documentation initiated by:  Anibal Henderson  Subjective/Objective Assessment:   Pt readmitted with COPD. Pt was given Rx for meds and was given a voucher for assistance, but [facility] is closed on Sunday afternoon and was closed on Memorial day for the holiday, so she was unable to fill them, and she had no     Action/Plan:   money to fill them herself. [facility] RN sent pt back to hospital   Anticipated DC Date:  03/09/2012   Anticipated DC Plan:  HOME W HOME HEALTH SERVICES      DC Planning Services  CM consult  Medication Assistance      [facility] Choice  HOME HEALTH   Choice offered to / List presented to:  C-1 Patient        HH arranged  HH-1 RN  HH-10 DISEASE MANAGEMENT      HH agency  [facility]   Status of service:  Completed, signed off Medicare Important Message given?   (If response is "NO", the following Medicare IM given date fields will be blank) Date Medicare IM given:   Date Additional Medicare IM given:    Discharge Disposition:    Per UR Regulation:    If discussed at Long Length of Stay Meetings, dates discussed:    Comments:  03/15/12 1620 Delayed entry-  Anibal Henderson RN Pt assisted with medications through [employer] grant fund and [employer].

## 2012-05-12 NOTE — Progress Notes (Signed)
UR Chart Review Completed  

## 2012-09-05 ENCOUNTER — Emergency Department (HOSPITAL_COMMUNITY)
Admission: EM | Admit: 2012-09-05 | Discharge: 2012-09-06 | Disposition: A | Discharge status: Home / Self Care | Payer: Medicaid Other | Attending: Emergency Medicine | Admitting: Emergency Medicine

## 2012-09-05 ENCOUNTER — Encounter (HOSPITAL_COMMUNITY): Payer: Self-pay | Admitting: *Deleted

## 2012-09-05 DIAGNOSIS — Z87891 Personal history of nicotine dependence: Secondary | ICD-10-CM | POA: Insufficient documentation

## 2012-09-05 DIAGNOSIS — M129 Arthropathy, unspecified: Secondary | ICD-10-CM | POA: Insufficient documentation

## 2012-09-05 DIAGNOSIS — Z9889 Other specified postprocedural states: Secondary | ICD-10-CM | POA: Insufficient documentation

## 2012-09-05 DIAGNOSIS — R059 Cough, unspecified: Secondary | ICD-10-CM | POA: Insufficient documentation

## 2012-09-05 DIAGNOSIS — J3489 Other specified disorders of nose and nasal sinuses: Secondary | ICD-10-CM | POA: Insufficient documentation

## 2012-09-05 DIAGNOSIS — M549 Dorsalgia, unspecified: Secondary | ICD-10-CM

## 2012-09-05 DIAGNOSIS — M25559 Pain in unspecified hip: Secondary | ICD-10-CM | POA: Insufficient documentation

## 2012-09-05 DIAGNOSIS — G8929 Other chronic pain: Secondary | ICD-10-CM | POA: Insufficient documentation

## 2012-09-05 DIAGNOSIS — Z8709 Personal history of other diseases of the respiratory system: Secondary | ICD-10-CM | POA: Insufficient documentation

## 2012-09-05 DIAGNOSIS — Z79899 Other long term (current) drug therapy: Secondary | ICD-10-CM | POA: Insufficient documentation

## 2012-09-05 DIAGNOSIS — R05 Cough: Secondary | ICD-10-CM | POA: Insufficient documentation

## 2012-09-05 DIAGNOSIS — M25569 Pain in unspecified knee: Secondary | ICD-10-CM | POA: Insufficient documentation

## 2012-09-05 LAB — CBC WITH DIFFERENTIAL/PLATELET
Basophils Absolute: 0.1 10*3/uL (ref 0.0–0.1)
Basophils Relative: 0 % (ref 0–1)
Eosinophils Absolute: 0.3 10*3/uL (ref 0.0–0.7)
Eosinophils Relative: 2 % (ref 0–5)
MCH: 29.5 pg (ref 26.0–34.0)
MCHC: 33 g/dL (ref 30.0–36.0)
MCV: 89.4 fL (ref 78.0–100.0)
Platelets: 286 10*3/uL (ref 150–400)
RDW: 13.9 % (ref 11.5–15.5)

## 2012-09-05 LAB — BASIC METABOLIC PANEL
Calcium: 9.5 mg/dL (ref 8.4–10.5)
GFR calc non Af Amer: >90 mL/min (ref >90)
Glucose, Bld: 95 mg/dL (ref 70–99)
Sodium: 141 mEq/L (ref 135–145)

## 2012-09-05 LAB — URINALYSIS, ROUTINE W REFLEX MICROSCOPIC
Protein, ur: NEGATIVE mg/dL
Urobilinogen, UA: 0.2 mg/dL (ref 0.0–1.0)

## 2012-09-05 LAB — URINE MICROSCOPIC-ADD ON

## 2012-09-05 MED ORDER — SODIUM CHLORIDE 0.9 % IV SOLN
Freq: Once | INTRAVENOUS | Status: AC
  Administered 2012-09-05: 999 mL/h via INTRAVENOUS

## 2012-09-05 MED ORDER — HYDROMORPHONE HCL PF 1 MG/ML IJ SOLN
1.0000 mg | Freq: Once | INTRAMUSCULAR | Status: AC
  Administered 2012-09-05: 1 mg via INTRAVENOUS
  Filled 2012-09-05: qty 1

## 2012-09-05 MED ORDER — OXYCODONE-ACETAMINOPHEN 5-325 MG PO TABS: 1.0000 | ORAL_TABLET | ORAL | Status: AC | PRN

## 2012-09-05 MED ORDER — ONDANSETRON HCL 4 MG/2ML IJ SOLN
4.0000 mg | Freq: Once | INTRAMUSCULAR | Status: AC
  Administered 2012-09-05: 4 mg via INTRAVENOUS
  Filled 2012-09-05: qty 2

## 2012-09-05 MED ORDER — PREDNISONE 20 MG PO TABS: ORAL_TABLET | ORAL | Status: DC

## 2012-09-05 NOTE — ED Notes (Signed)
Significant relief of pain after medicated.  States this is the least she has hurt for a long time.

## 2012-09-05 NOTE — ED Notes (Addendum)
Daughter phone number [phone]  Or  [phone]

## 2012-09-05 NOTE — ED Notes (Signed)
C/o lower back pain, bil hip pain, and bil knee pain x 2 days, getting worse.  Denies fall/injury.  Also c/o cough x 1 wk, non-productive, denies fever.

## 2012-09-05 NOTE — ED Provider Notes (Signed)
History     CSN: [id number]  Arrival date & time 09/05/12  1830   First MD Initiated Contact with Patient 09/05/12 2003      Chief Complaint  Patient presents with  . Back Pain  . Hip Pain  . Knee Pain    (Consider location/radiation/quality/duration/timing/severity/associated sxs/prior treatment) HPI Comments: Patient c/o bilateral hip and knee pain and lower back pain that's chronic but states the pain has worsened for 2 days.  States the pain feels similar to her arthritis pain.  Pain is worse with certain movements and weight bearing, improves somewhat with rest.  She denies fever, vomiting, numbness, weakness ,swelling or redness.    Patient also c/o nasal congestion and cough for one week.  Stats she has not seen her PMD recently.  She also denies chest pain or shortness of breath  Patient is a 61 y.o. female presenting with back pain. The history is provided by the patient.  Back Pain  This is a recurrent problem. The current episode started 2 days ago. The problem occurs constantly. The problem has not changed since onset.The pain is associated with no known injury. The pain is present in the lumbar spine and sacro-iliac joint. The quality of the pain is described as aching. The pain does not radiate. The pain is moderate. The symptoms are aggravated by bending and twisting. Associated symptoms include leg pain. Pertinent negatives include no chest pain, no fever, no numbness, no headaches, no abdominal pain, no abdominal swelling, no bowel incontinence, no perianal numbness, no bladder incontinence, no dysuria, no pelvic pain, no paresthesias, no paresis, no tingling and no weakness. Associated symptoms comments: Bilateral knee pain, bilateral hip pain and low back pain. She has tried analgesics for the symptoms. The treatment provided no relief. Risk factors include obesity (OA).    Past Medical History  Diagnosis Date  . Arthritis   . Bronchitis     Past Surgical History    Procedure Date  . Knee surgery   . Appendectomy   . Tonsillectomy     Family History  Problem Relation Age of Onset  . Heart failure Father   . Diabetes Father   . Kidney failure Father     History  Substance Use Topics  . Smoking status: Former Smoker -- 0.5 packs/day    Quit date: 02/26/2012  . Smokeless tobacco: Not on file  . Alcohol Use: No    OB History    Grav Para Term Preterm Abortions TAB SAB Ect Mult Living   2 2 2              Review of Systems  Constitutional: Negative for fever, activity change and appetite change.  HENT: Positive for congestion. Negative for trouble swallowing, neck pain and neck stiffness.   Respiratory: Positive for cough. Negative for chest tightness and shortness of breath.   Cardiovascular: Negative for chest pain.  Gastrointestinal: Negative for nausea, vomiting, abdominal pain, constipation and bowel incontinence.  Genitourinary: Negative for bladder incontinence, dysuria, hematuria, flank pain, decreased urine volume, difficulty urinating and pelvic pain.       No perineal numbness or incontinence of urine or feces  Musculoskeletal: Positive for back pain. Negative for joint swelling.  Skin: Negative for rash.  Neurological: Negative for dizziness, tingling, weakness, numbness, headaches and paresthesias.  All other systems reviewed and are negative.    Allergies  Bee venom; Amitriptyline; and Tramadol  Home Medications   Current Outpatient Rx  Name  Route  Sig  Dispense  Refill  . ACETAMINOPHEN 500 MG PO TABS   Oral   Take 1,000 mg by mouth daily as needed. For pain           BP 183/68  Pulse 67  Temp 97.4 F (36.3 C) (Oral)  Resp 20  Ht 5\' 4"  (1.626 m)  Wt 215 lb (97.523 kg)  BMI 36.90 kg/m2  SpO2 96%  Physical Exam  Nursing note and vitals reviewed. Constitutional: She is oriented to person, place, and time. She appears well-developed and well-nourished. No distress.  HENT:  Head: Normocephalic and  atraumatic.  Right Ear: Tympanic membrane and ear canal normal.  Left Ear: Tympanic membrane and ear canal normal.  Mouth/Throat: Uvula is midline, oropharynx is clear and moist and mucous membranes are normal. No oropharyngeal exudate.  Eyes: EOM are normal. Pupils are equal, round, and reactive to light.  Neck: Normal range of motion. Neck supple.  Cardiovascular: Normal rate, regular rhythm, normal heart sounds and intact distal pulses.   No murmur heard. Pulmonary/Chest: Effort normal and breath sounds normal. No respiratory distress. She has no rales. She exhibits no tenderness.       Coarse lungs sounds bilaterally. No rales or wheezes  Abdominal: Soft. She exhibits no distension and no mass. There is no tenderness. There is no rebound and no guarding.  Musculoskeletal: She exhibits tenderness. She exhibits no edema.       Lumbar back: She exhibits tenderness and pain. She exhibits normal range of motion, no swelling, no deformity, no laceration and normal pulse.       Back:       ttp of the lumbar paraspinal muscles.  No edema or erythema, DP pulses are equal and brisk, distal sensation intact,  No calf pain or edema.    Lymphadenopathy:    She has no cervical adenopathy.  Neurological: She is alert and oriented to person, place, and time. No cranial nerve deficit or sensory deficit. She exhibits normal muscle tone. Coordination and gait normal.  Reflex Scores:      Patellar reflexes are 2+ on the right side and 2+ on the left side.      Achilles reflexes are 2+ on the right side and 2+ on the left side. Skin: Skin is warm and dry.    ED Course  Procedures (including critical care time)  Results for orders placed during the hospital encounter of 09/05/12  CBC WITH DIFFERENTIAL      Component Value Range   WBC 14.1 (*) 4.0 - 10.5 K/uL   RBC 5.39 (*) 3.87 - 5.11 MIL/uL   Hemoglobin 15.9 (*) 12.0 - 15.0 g/dL   HCT 16.1 (*) 09.6 - 04.5 %   MCV 89.4  78.0 - 100.0 fL   MCH 29.5   26.0 - 34.0 pg   MCHC 33.0  30.0 - 36.0 g/dL   RDW 40.9  81.1 - 91.4 %   Platelets 286  150 - 400 K/uL   Neutrophils Relative 41 (*) 43 - 77 %   Neutro Abs 5.8  1.7 - 7.7 K/uL   Lymphocytes Relative 46  12 - 46 %   Lymphs Abs 6.6 (*) 0.7 - 4.0 K/uL   Monocytes Relative 10  3 - 12 %   Monocytes Absolute 1.5 (*) 0.1 - 1.0 K/uL   Eosinophils Relative 2  0 - 5 %   Eosinophils Absolute 0.3  0.0 - 0.7 K/uL   Basophils Relative 0  0 - 1 %  Basophils Absolute 0.1  0.0 - 0.1 K/uL  BASIC METABOLIC PANEL      Component Value Range   Sodium 141  135 - 145 mEq/L   Potassium 4.0  3.5 - 5.1 mEq/L   Chloride 104  96 - 112 mEq/L   CO2 29  19 - 32 mEq/L   Glucose, Bld 95  70 - 99 mg/dL   BUN 15  6 - 23 mg/dL   Creatinine, Ser 1.61  0.50 - 1.10 mg/dL   Calcium 9.5  8.4 - 09.6 mg/dL   GFR calc non Af Amer >90  >90 mL/min   GFR calc Af Amer >90  >90 mL/min  URINALYSIS, ROUTINE W REFLEX MICROSCOPIC      Component Value Range   Color, Urine YELLOW  YELLOW   APPearance CLEAR  CLEAR   Specific Gravity, Urine >1.030 (*) 1.005 - 1.030   pH 6.0  5.0 - 8.0   Glucose, UA NEGATIVE  NEGATIVE mg/dL   Hgb urine dipstick SMALL (*) NEGATIVE   Bilirubin Urine NEGATIVE  NEGATIVE   Ketones, ur NEGATIVE  NEGATIVE mg/dL   Protein, ur NEGATIVE  NEGATIVE mg/dL   Urobilinogen, UA 0.2  0.0 - 1.0 mg/dL   Nitrite NEGATIVE  NEGATIVE   Leukocytes, UA NEGATIVE  NEGATIVE  URINE MICROSCOPIC-ADD ON      Component Value Range   Squamous Epithelial / LPF FEW (*) RARE   WBC, UA 3-6  <3 WBC/hpf   RBC / HPF 3-6  <3 RBC/hpf         MDM    Patient is feeling better, ambulates with a slow but steady gait.  No focal neuro deficits on exam.  Labs reviewed and discussed with patient.  Also discussed pt hx and lab results with EDP.  Pain likely related to exacerbation of arthritis.  Pt agrees to f/u with her PMD for recheck  Prescribed: Percocet #12 Prednisone 20mg         Levii Hairfield L. [place], [place] 09/08/12 [phone]

## 2012-09-06 NOTE — ED Notes (Signed)
Pt discharged. Pt stable at time of discharge. Medications reviewed pt has no questions regarding discharge at this time. Pt voiced understanding of discharge instructions.  

## 2012-09-14 MED FILL — Oxycodone w/ Acetaminophen Tab 5-325 MG: ORAL | Qty: 6 | Status: AC

## 2012-09-14 NOTE — ED Provider Notes (Signed)
Medical screening examination/treatment/procedure(s) were performed by non-physician practitioner and as supervising physician I was immediately available for consultation/collaboration.  Donnetta Hutching, MD 09/14/12 [phone]

## 2012-10-06 ENCOUNTER — Encounter (HOSPITAL_COMMUNITY): Payer: Self-pay | Admitting: *Deleted

## 2012-10-06 ENCOUNTER — Emergency Department (HOSPITAL_COMMUNITY): Payer: Medicaid Other

## 2012-10-06 ENCOUNTER — Emergency Department (HOSPITAL_COMMUNITY)
Admission: EM | Admit: 2012-10-06 | Discharge: 2012-10-06 | Disposition: A | Discharge status: Home / Self Care | Payer: Medicaid Other | Attending: Emergency Medicine | Admitting: Emergency Medicine

## 2012-10-06 DIAGNOSIS — Z8739 Personal history of other diseases of the musculoskeletal system and connective tissue: Secondary | ICD-10-CM | POA: Insufficient documentation

## 2012-10-06 DIAGNOSIS — G8929 Other chronic pain: Secondary | ICD-10-CM | POA: Insufficient documentation

## 2012-10-06 DIAGNOSIS — R110 Nausea: Secondary | ICD-10-CM | POA: Insufficient documentation

## 2012-10-06 DIAGNOSIS — Z87891 Personal history of nicotine dependence: Secondary | ICD-10-CM | POA: Insufficient documentation

## 2012-10-06 DIAGNOSIS — J3489 Other specified disorders of nose and nasal sinuses: Secondary | ICD-10-CM | POA: Insufficient documentation

## 2012-10-06 DIAGNOSIS — M549 Dorsalgia, unspecified: Secondary | ICD-10-CM | POA: Insufficient documentation

## 2012-10-06 DIAGNOSIS — Z8709 Personal history of other diseases of the respiratory system: Secondary | ICD-10-CM | POA: Insufficient documentation

## 2012-10-06 DIAGNOSIS — G43909 Migraine, unspecified, not intractable, without status migrainosus: Secondary | ICD-10-CM | POA: Insufficient documentation

## 2012-10-06 DIAGNOSIS — M542 Cervicalgia: Secondary | ICD-10-CM | POA: Insufficient documentation

## 2012-10-06 LAB — CBC WITH DIFFERENTIAL/PLATELET
Eosinophils Absolute: 0.2 10*3/uL (ref 0.0–0.7)
Eosinophils Relative: 2 % (ref 0–5)
Hemoglobin: 15.6 g/dL — ABNORMAL HIGH (ref 12.0–15.0)
Lymphs Abs: 4.4 10*3/uL — ABNORMAL HIGH (ref 0.7–4.0)
MCH: 29.8 pg (ref 26.0–34.0)
MCV: 89.5 fL (ref 78.0–100.0)
Monocytes Absolute: 0.9 10*3/uL (ref 0.1–1.0)
Monocytes Relative: 7 % (ref 3–12)
Platelets: 272 10*3/uL (ref 150–400)
RBC: 5.23 MIL/uL — ABNORMAL HIGH (ref 3.87–5.11)

## 2012-10-06 LAB — BASIC METABOLIC PANEL
BUN: 10 mg/dL (ref 6–23)
Calcium: 8.8 mg/dL (ref 8.4–10.5)
Creatinine, Ser: 0.55 mg/dL (ref 0.50–1.10)
GFR calc non Af Amer: >90 mL/min (ref >90)
Glucose, Bld: 99 mg/dL (ref 70–99)

## 2012-10-06 MED ORDER — HYDROMORPHONE HCL PF 1 MG/ML IJ SOLN
1.0000 mg | Freq: Once | INTRAMUSCULAR | Status: AC
  Administered 2012-10-06: 1 mg via INTRAVENOUS
  Filled 2012-10-06: qty 1

## 2012-10-06 MED ORDER — HYDROCODONE-ACETAMINOPHEN 5-325 MG PO TABS: 1.0000 | ORAL_TABLET | Freq: Four times a day (QID) | ORAL | Status: DC | PRN

## 2012-10-06 MED ORDER — DEXAMETHASONE SODIUM PHOSPHATE 4 MG/ML IJ SOLN
10.0000 mg | Freq: Once | INTRAMUSCULAR | Status: AC
  Administered 2012-10-06: 10 mg via INTRAVENOUS
  Filled 2012-10-06: qty 3

## 2012-10-06 MED ORDER — PROMETHAZINE HCL 25 MG/ML IJ SOLN
12.5000 mg | Freq: Once | INTRAMUSCULAR | Status: AC
  Administered 2012-10-06: 12.5 mg via INTRAVENOUS
  Filled 2012-10-06: qty 1

## 2012-10-06 MED ORDER — DIPHENHYDRAMINE HCL 50 MG/ML IJ SOLN
25.0000 mg | Freq: Once | INTRAMUSCULAR | Status: AC
  Administered 2012-10-06: 25 mg via INTRAVENOUS
  Filled 2012-10-06: qty 1

## 2012-10-06 MED ORDER — SODIUM CHLORIDE 0.9 % IV SOLN: INTRAVENOUS | Status: DC

## 2012-10-06 MED ORDER — HYDROCODONE-ACETAMINOPHEN 5-325 MG PO TABS: 1.0000 | ORAL_TABLET | Freq: Four times a day (QID) | ORAL | Status: AC | PRN

## 2012-10-06 MED ORDER — SODIUM CHLORIDE 0.9 % IV BOLUS (SEPSIS)
250.0000 mL | Freq: Once | INTRAVENOUS | Status: AC
  Administered 2012-10-06: 250 mL via INTRAVENOUS

## 2012-10-06 NOTE — ED Notes (Signed)
Pt states "sinus pain" Pain to head and facial pain. Cough began during the night. Pt tearful during triage.

## 2012-10-06 NOTE — ED Notes (Signed)
Assisted pt with BSC. Pain has decreased.

## 2012-10-06 NOTE — ED Provider Notes (Signed)
History  This chart was scribed for Shelda Jakes, MD by [name], ED Scribe. This patient was seen in room [facility]08/[facility]08 and the patient's care was started at 8:06 AM.   CSN: [id number]  Arrival date & time 10/06/12  0746   First MD Initiated Contact with Patient 10/06/12 [phone]      Chief Complaint  Patient presents with  . Facial Pain  . Headache  . Cough     Patient is a 61 y.o. female presenting with headaches. The history is provided by the patient. No language interpreter was used.  Headache  This is a new problem. The current episode started 12 to 24 hours ago. The problem occurs constantly. The problem has been gradually worsening. Pain location: maxillary sinuses. The quality of the pain is described as throbbing. The pain is at a severity of 10/10. The pain is severe. Radiates to: left jaw and left neck. Associated symptoms include nausea. Pertinent negatives include no fever, no chest pressure, no shortness of breath and no vomiting.    Carla [name] is a 61 y.o. female who presents to the Emergency Department complaining of  days of gradual onset, gradually worsening, constant bilateral maxillary sinus pain described as throbbing and worse on the left that radiates into her left jaw and left neck with associated nausea. The pain is aggravated by light and movement of her eyes. She reports that she took one dose of sudafed last night with no improvement in symptoms. She states that she has experienced prior sinus pain that she rates as milder that cleared with OTC and prior migraines with similar symptoms. She denies that she has been put on antibiotics for the sinus pain before. She reports chronic back pain controlled with tylenol and ibuprofen but denies changes and denies cough, congestion, and sore throat as associated symptoms. She has a h/o arthritis and is a former smoker but denies alcohol use.  Dr. Loney Hering is PCP.  Past Medical History  Diagnosis Date  .  Arthritis   . Bronchitis     Past Surgical History  Procedure Date  . Knee surgery   . Appendectomy   . Tonsillectomy     Family History  Problem Relation Age of Onset  . Heart failure Father   . Diabetes Father   . Kidney failure Father     History  Substance Use Topics  . Smoking status: Former Smoker -- 0.5 packs/day    Quit date: 02/26/2012  . Smokeless tobacco: Not on file  . Alcohol Use: No    OB History    Grav Para Term Preterm Abortions TAB SAB Ect Mult Living   2 2 2              Review of Systems  Constitutional: Negative for fever and chills.  HENT: Positive for neck pain (radiation from HA) and sinus pressure. Negative for congestion and sore throat.   Respiratory: Negative for shortness of breath.   Cardiovascular: Negative for chest pain.  Gastrointestinal: Positive for nausea. Negative for vomiting, abdominal pain and diarrhea.  Musculoskeletal: Positive for back pain (chronic, no changes).  Skin: Negative for rash.  Neurological: Positive for headaches. Negative for syncope.  All other systems reviewed and are negative.    Allergies  Bee venom; Amitriptyline; and Tramadol  Home Medications   Current Outpatient Rx  Name  Route  Sig  Dispense  Refill  . IBUPROFEN 200 MG PO TABS   Oral   Take 400 mg  by mouth every 6 (six) hours as needed. For sinus headaches         . PSEUDOEPHEDRINE HCL 30 MG PO TABS   Oral   Take 60 mg by mouth every 4 (four) hours as needed. For sinus congestion         . HYDROCODONE-ACETAMINOPHEN 5-325 MG PO TABS   Oral   Take 1-2 tablets by mouth every 6 (six) hours as needed for pain.   6 tablet   0   . HYDROCODONE-ACETAMINOPHEN 5-325 MG PO TABS   Oral   Take 1-2 tablets by mouth every 6 (six) hours as needed for pain.   10 tablet   0     Triage Vitals: BP 210/94  Pulse 78  Temp 98 F (36.7 C) (Oral)  Resp 16  Ht 5\' 4"  (1.626 m)  Wt 220 lb (99.791 kg)  BMI 37.76 kg/m2  SpO2 97%  Physical Exam   Nursing note and vitals reviewed. Constitutional: She is oriented to person, place, and time. She appears well-developed and well-nourished. No distress.  HENT:  Head: Normocephalic and atraumatic.  Eyes: EOM are normal. Pupils are equal, round, and reactive to light.       Scleral is edematous and injected bilaterally, no exudate  Neck: Normal range of motion. Neck supple. No tracheal deviation present.  Cardiovascular: Normal rate and regular rhythm.   No murmur heard. Pulmonary/Chest: Effort normal and breath sounds normal. No respiratory distress.  Abdominal: Soft. Bowel sounds are normal. There is no tenderness.  Musculoskeletal: Normal range of motion. She exhibits no edema.  Neurological: She is alert and oriented to person, place, and time. No cranial nerve deficit.       Pt able to move both sets of fingers and toes  Skin: Skin is warm and dry.  Psychiatric: She has a normal mood and affect. Her behavior is normal.    ED Course  Procedures (including critical care time)  DIAGNOSTIC STUDIES: Oxygen Saturation is 97% on room air, adequate by my interpretation.    COORDINATION OF CARE: 8:32 AM- Discussed treatment plan which includes CT head and migraine cocktail with pt at bedside and pt agreed to plan.  8:45 AM-Ordered 250 mL of Bolus, 1 mg Dilaudid injection, 10 mg Decadron injection, 25 mg Benadryl injection and 12.5 mg Phenergan injection.  11:59 AM-Pt rechecked and feels improved but still c/o left jaw pain. Informed pt of radiology and lab results. Discussed discharge plan which includes hydrocodone prescription with pt and pt agreed to plan. Also advised pt to follow up and pt agreed.   Labs Reviewed  CBC WITH DIFFERENTIAL - Abnormal; Notable for the following:    WBC 13.4 (*)     RBC 5.23 (*)     Hemoglobin 15.6 (*)     HCT 46.8 (*)     Neutro Abs 7.8 (*)     Lymphs Abs 4.4 (*)     All other components within normal limits  BASIC METABOLIC PANEL   Ct Head Wo  Contrast  10/06/2012  *RADIOLOGY REPORT*  Clinical Data: Headache, cough, facial pain  CT HEAD WITHOUT CONTRAST  Technique:  Contiguous axial images were obtained from the base of the skull through the vertex without contrast.  Comparison: 01/30/2012  Findings: No skull fracture is noted.  There is circumferential mucosal thickening left maxillary sinus.  The mastoid air cells are unremarkable.  No intracranial hemorrhage, mass effect or midline shift.  No hydrocephalus.  The gray and white  matter differentiation is preserved.  No acute infarction.  No mass lesion is noted on this unenhanced scan.  IMPRESSION: No acute intracranial abnormality.  Mucosal thickening left maxillary sinus.   Original Report Authenticated By: Natasha Mead, M.D.    Results for orders placed during the hospital encounter of 10/06/12  CBC WITH DIFFERENTIAL      Component Value Range   WBC 13.4 (*) 4.0 - 10.5 K/uL   RBC 5.23 (*) 3.87 - 5.11 MIL/uL   Hemoglobin 15.6 (*) 12.0 - 15.0 g/dL   HCT 16.1 (*) 09.6 - 04.5 %   MCV 89.5  78.0 - 100.0 fL   MCH 29.8  26.0 - 34.0 pg   MCHC 33.3  30.0 - 36.0 g/dL   RDW 40.9  81.1 - 91.4 %   Platelets 272  150 - 400 K/uL   Neutrophils Relative 59  43 - 77 %   Neutro Abs 7.8 (*) 1.7 - 7.7 K/uL   Lymphocytes Relative 33  12 - 46 %   Lymphs Abs 4.4 (*) 0.7 - 4.0 K/uL   Monocytes Relative 7  3 - 12 %   Monocytes Absolute 0.9  0.1 - 1.0 K/uL   Eosinophils Relative 2  0 - 5 %   Eosinophils Absolute 0.2  0.0 - 0.7 K/uL   Basophils Relative 0  0 - 1 %   Basophils Absolute 0.0  0.0 - 0.1 K/uL  BASIC METABOLIC PANEL      Component Value Range   Sodium 137  135 - 145 mEq/L   Potassium 4.3  3.5 - 5.1 mEq/L   Chloride 101  96 - 112 mEq/L   CO2 27  19 - 32 mEq/L   Glucose, Bld 99  70 - 99 mg/dL   BUN 10  6 - 23 mg/dL   Creatinine, Ser 7.82  0.50 - 1.10 mg/dL   Calcium 8.8  8.4 - 95.6 mg/dL   GFR calc non Af Amer >90  >90 mL/min   GFR calc Af Amer >90  >90 mL/min     1. Migraine        MDM   Suspect symptoms more related to migraine-like headache. Patient states she's had the severe migraines in the past but not frequently. No evidence of significant sinusitis. Patient improved with the migraine cocktail here in the emergency department. Head CT without any significant intracranial findings.      I personally performed the services described in this documentation, which was scribed in my presence. The recorded information has been reviewed and is accurate.     Shelda Jakes, MD 10/06/12 [phone]

## 2012-10-14 MED FILL — Hydrocodone-Acetaminophen Tab 5-325 MG: ORAL | Qty: 6 | Status: AC

## 2012-10-31 ENCOUNTER — Encounter (HOSPITAL_COMMUNITY): Payer: Self-pay

## 2012-10-31 ENCOUNTER — Emergency Department (HOSPITAL_COMMUNITY): Payer: Medicaid Other

## 2012-10-31 ENCOUNTER — Emergency Department (HOSPITAL_COMMUNITY)
Admission: EM | Admit: 2012-10-31 | Discharge: 2012-10-31 | Disposition: A | Discharge status: Home / Self Care | Payer: Medicaid Other | Attending: Emergency Medicine | Admitting: Emergency Medicine

## 2012-10-31 DIAGNOSIS — M549 Dorsalgia, unspecified: Secondary | ICD-10-CM

## 2012-10-31 DIAGNOSIS — IMO0002 Reserved for concepts with insufficient information to code with codable children: Secondary | ICD-10-CM | POA: Insufficient documentation

## 2012-10-31 DIAGNOSIS — X503XXA Overexertion from repetitive movements, initial encounter: Secondary | ICD-10-CM | POA: Insufficient documentation

## 2012-10-31 DIAGNOSIS — Y93F2 Activity, caregiving, lifting: Secondary | ICD-10-CM | POA: Insufficient documentation

## 2012-10-31 DIAGNOSIS — Z9889 Other specified postprocedural states: Secondary | ICD-10-CM | POA: Insufficient documentation

## 2012-10-31 DIAGNOSIS — Z8709 Personal history of other diseases of the respiratory system: Secondary | ICD-10-CM | POA: Insufficient documentation

## 2012-10-31 DIAGNOSIS — Z8739 Personal history of other diseases of the musculoskeletal system and connective tissue: Secondary | ICD-10-CM | POA: Insufficient documentation

## 2012-10-31 DIAGNOSIS — Z87891 Personal history of nicotine dependence: Secondary | ICD-10-CM | POA: Insufficient documentation

## 2012-10-31 DIAGNOSIS — Y929 Unspecified place or not applicable: Secondary | ICD-10-CM | POA: Insufficient documentation

## 2012-10-31 MED ORDER — ONDANSETRON HCL 4 MG/2ML IJ SOLN
4.0000 mg | Freq: Once | INTRAMUSCULAR | Status: AC
  Administered 2012-10-31: 4 mg via INTRAVENOUS
  Filled 2012-10-31: qty 2

## 2012-10-31 MED ORDER — SODIUM CHLORIDE 0.9 % IV BOLUS (SEPSIS)
500.0000 mL | Freq: Once | INTRAVENOUS | Status: AC
  Administered 2012-10-31: 500 mL via INTRAVENOUS

## 2012-10-31 MED ORDER — OXYCODONE-ACETAMINOPHEN 5-325 MG PO TABS: 2.0000 | ORAL_TABLET | ORAL | Status: DC | PRN

## 2012-10-31 MED ORDER — CYCLOBENZAPRINE HCL 10 MG PO TABS
10.0000 mg | ORAL_TABLET | Freq: Once | ORAL | Status: AC
  Administered 2012-10-31: 10 mg via ORAL
  Filled 2012-10-31: qty 1

## 2012-10-31 MED ORDER — KETOROLAC TROMETHAMINE 30 MG/ML IJ SOLN
30.0000 mg | Freq: Once | INTRAMUSCULAR | Status: AC
  Administered 2012-10-31: 30 mg via INTRAVENOUS
  Filled 2012-10-31: qty 1

## 2012-10-31 MED ORDER — CYCLOBENZAPRINE HCL 10 MG PO TABS: 10.0000 mg | ORAL_TABLET | Freq: Two times a day (BID) | ORAL | Status: DC | PRN

## 2012-10-31 MED ORDER — OXYCODONE-ACETAMINOPHEN 5-325 MG PO TABS
2.0000 | ORAL_TABLET | Freq: Once | ORAL | Status: AC
  Administered 2012-10-31: 2 via ORAL
  Filled 2012-10-31: qty 2

## 2012-10-31 MED ORDER — HYDROMORPHONE HCL PF 1 MG/ML IJ SOLN
1.0000 mg | Freq: Once | INTRAMUSCULAR | Status: AC
  Administered 2012-10-31: 1 mg via INTRAVENOUS
  Filled 2012-10-31: qty 1

## 2012-10-31 NOTE — ED Notes (Signed)
Complain of low back pain after lifting her invalid husband. Morphine 4 mg IV given by EMS prior to arrival

## 2012-10-31 NOTE — ED Notes (Signed)
Pt given 4 mg morphine in route to hospital with EMS.

## 2012-10-31 NOTE — ED Provider Notes (Addendum)
History   This chart was scribed for Donnetta Hutching, MD by Leone Payor, ED Scribe. This patient was seen in room APA10/APA10 and the patient's care was started at 1737.  CSN: [id number]  Arrival date & time 10/31/12  1708   First MD Initiated Contact with Patient 10/31/12 1737      Chief Complaint  Patient presents with  . Back Pain     The history is provided by the patient. No language interpreter was used.    Carla [name] is a 62 y.o. female brought in by ambulance, who presents to the Emergency Department complaining of new, constant, unchanged, moderate lower back pain that radiates to both knees after lifting her invalid husband with onset PTA. Pt was given 4mg  morphine by EMS en route to hospital.    Pt has h/o arthritis, bronchitis, knee surgery.  Pt is a former smoker but denies alcohol use.  Past Medical History  Diagnosis Date  . Arthritis   . Bronchitis     Past Surgical History  Procedure Date  . Knee surgery   . Appendectomy   . Tonsillectomy     Family History  Problem Relation Age of Onset  . Heart failure Father   . Diabetes Father   . Kidney failure Father     History  Substance Use Topics  . Smoking status: Former Smoker -- 0.5 packs/day    Quit date: 02/26/2012  . Smokeless tobacco: Not on file  . Alcohol Use: No    OB History    Grav Para Term Preterm Abortions TAB SAB Ect Mult Living   2 2 2              Review of Systems  A complete 10 system review of systems was obtained and all systems are negative except as noted in the HPI and PMH.    Allergies  Bee venom; Amitriptyline; and Tramadol  Home Medications   Current Outpatient Rx  Name  Route  Sig  Dispense  Refill  . IBUPROFEN 200 MG PO TABS   Oral   Take 400 mg by mouth every 6 (six) hours as needed. For sinus headaches           BP 185/74  Pulse 82  Resp 22  Ht 5\' 4"  (1.626 m)  Wt 220 lb (99.791 kg)  BMI 37.76 kg/m2  SpO2 97%  Physical Exam  Nursing note and  vitals reviewed. Constitutional: She is oriented to person, place, and time. She appears well-developed and well-nourished.  HENT:  Head: Normocephalic and atraumatic.  Eyes: Conjunctivae normal and EOM are normal. Pupils are equal, round, and reactive to light.  Neck: Normal range of motion. Neck supple.  Cardiovascular: Normal rate, regular rhythm and normal heart sounds.   Pulmonary/Chest: Effort normal and breath sounds normal.  Abdominal: Soft. Bowel sounds are normal.  Musculoskeletal: Normal range of motion.       Tenderness to the lower lumbar spine.   Neurological: She is alert and oriented to person, place, and time.       Painful straight leg raise bilaterally.   Skin: Skin is warm and dry.  Psychiatric: She has a normal mood and affect.    ED Course  Procedures (including critical care time)  DIAGNOSTIC STUDIES: Oxygen Saturation is 97% on room air, adequate by my interpretation.    COORDINATION OF CARE:  5:46 PM Discussed treatment plan which includes imaging of lumbar spine with pt at bedside and pt agreed to  plan.      Labs Reviewed - No data to display Dg Lumbar Spine Complete  10/31/2012  *RADIOLOGY REPORT*  Clinical Data: Pain after lifting  LUMBAR SPINE - COMPLETE 4+ VIEW  Comparison: Lumbar spine radiographs 01/06/2012  Findings: Lumbar spine vertebral bodies are normal in height and alignment.  Small anterior osteophytes are present.  There is mild disc space narrowing at L4-L5, stable.  No acute fracture or pars defect is visualized.  Sacroiliac joints are unremarkable.  IMPRESSION: No acute bony abnormality.   Original Report Authenticated By: Britta Mccreedy, M.D.      No diagnosis found.    MDM  Patient feeling better after pain management.  Discharge  with Percocet #15 and Flexeril 10 mg #15.   No bowel or bladder incontinence     I personally performed the services described in this documentation, which was scribed in my presence. The recorded  information has been reviewed and is accurate.   Donnetta Hutching, MD 10/31/12 1610  Donnetta Hutching, MD 10/31/12 2135

## 2012-12-23 ENCOUNTER — Encounter (HOSPITAL_COMMUNITY): Payer: Self-pay | Admitting: *Deleted

## 2012-12-23 ENCOUNTER — Emergency Department (HOSPITAL_COMMUNITY)
Admission: EM | Admit: 2012-12-23 | Discharge: 2012-12-23 | Disposition: A | Discharge status: Home / Self Care | Payer: Medicaid Other | Attending: Emergency Medicine | Admitting: Emergency Medicine

## 2012-12-23 DIAGNOSIS — R059 Cough, unspecified: Secondary | ICD-10-CM | POA: Insufficient documentation

## 2012-12-23 DIAGNOSIS — I10 Essential (primary) hypertension: Secondary | ICD-10-CM | POA: Insufficient documentation

## 2012-12-23 DIAGNOSIS — M62830 Muscle spasm of back: Secondary | ICD-10-CM

## 2012-12-23 DIAGNOSIS — R05 Cough: Secondary | ICD-10-CM | POA: Insufficient documentation

## 2012-12-23 DIAGNOSIS — Z8709 Personal history of other diseases of the respiratory system: Secondary | ICD-10-CM | POA: Insufficient documentation

## 2012-12-23 DIAGNOSIS — Z87891 Personal history of nicotine dependence: Secondary | ICD-10-CM | POA: Insufficient documentation

## 2012-12-23 DIAGNOSIS — Z8739 Personal history of other diseases of the musculoskeletal system and connective tissue: Secondary | ICD-10-CM | POA: Insufficient documentation

## 2012-12-23 DIAGNOSIS — G8929 Other chronic pain: Secondary | ICD-10-CM | POA: Insufficient documentation

## 2012-12-23 DIAGNOSIS — Z79899 Other long term (current) drug therapy: Secondary | ICD-10-CM | POA: Insufficient documentation

## 2012-12-23 DIAGNOSIS — M62838 Other muscle spasm: Secondary | ICD-10-CM | POA: Insufficient documentation

## 2012-12-23 HISTORY — DX: Other chronic pain: G89.29

## 2012-12-23 HISTORY — DX: Essential (primary) hypertension: I10

## 2012-12-23 HISTORY — DX: Dorsalgia, unspecified: M54.9

## 2012-12-23 MED ORDER — DIAZEPAM 5 MG PO TABS
10.0000 mg | ORAL_TABLET | Freq: Once | ORAL | Status: AC
  Administered 2012-12-23: 10 mg via ORAL
  Filled 2012-12-23: qty 2

## 2012-12-23 MED ORDER — DEXAMETHASONE SODIUM PHOSPHATE 4 MG/ML IJ SOLN
8.0000 mg | Freq: Once | INTRAMUSCULAR | Status: AC
  Administered 2012-12-23: 8 mg via INTRAMUSCULAR
  Filled 2012-12-23: qty 2

## 2012-12-23 MED ORDER — DEXAMETHASONE 6 MG PO TABS: ORAL_TABLET | ORAL | Status: DC

## 2012-12-23 MED ORDER — HYDROCODONE-ACETAMINOPHEN 7.5-325 MG PO TABS: 1.0000 | ORAL_TABLET | ORAL | Status: DC | PRN

## 2012-12-23 MED ORDER — ONDANSETRON HCL 4 MG PO TABS
4.0000 mg | ORAL_TABLET | Freq: Once | ORAL | Status: AC
  Administered 2012-12-23: 4 mg via ORAL
  Filled 2012-12-23: qty 1

## 2012-12-23 MED ORDER — METHOCARBAMOL 500 MG PO TABS: ORAL_TABLET | ORAL | Status: DC

## 2012-12-23 MED ORDER — MORPHINE SULFATE 4 MG/ML IJ SOLN
8.0000 mg | Freq: Once | INTRAMUSCULAR | Status: AC
  Administered 2012-12-23: 8 mg via INTRAMUSCULAR
  Filled 2012-12-23: qty 2

## 2012-12-23 NOTE — ED Provider Notes (Signed)
History     CSN: [id number]  Arrival date & time 12/23/12  1431   First MD Initiated Contact with Patient 12/23/12 1508      Chief Complaint  Patient presents with  . Back Pain    (Consider location/radiation/quality/duration/timing/severity/associated sxs/prior treatment) Patient is a 62 y.o. female presenting with back pain. The history is provided by the patient.  Back Pain Location:  Lumbar spine Quality:  Cramping Radiates to:  L thigh Pain severity:  Severe Pain is:  Same all the time Onset quality:  Gradual Timing:  Intermittent Progression:  Worsening Chronicity:  Chronic Relieved by:  NSAIDs Worsened by:  Movement, standing and twisting Ineffective treatments:  None tried Associated symptoms: no abdominal pain, no bladder incontinence, no bowel incontinence, no chest pain, no dysuria and no perianal numbness     Past Medical History  Diagnosis Date  . Arthritis   . Bronchitis   . Hypertension   . Chronic back pain     Past Surgical History  Procedure Laterality Date  . Knee surgery    . Appendectomy    . Tonsillectomy      Family History  Problem Relation Age of Onset  . Heart failure Father   . Diabetes Father   . Kidney failure Father     History  Substance Use Topics  . Smoking status: Former Smoker -- 0.50 packs/day    Quit date: 02/26/2012  . Smokeless tobacco: Not on file  . Alcohol Use: No    OB History   Grav Para Term Preterm Abortions TAB SAB Ect Mult Living   2 2 2              Review of Systems  Constitutional: Negative for activity change.       All ROS Neg except as noted in HPI  HENT: Negative for nosebleeds and neck pain.   Eyes: Negative for photophobia and discharge.  Respiratory: Positive for cough. Negative for shortness of breath and wheezing.   Cardiovascular: Negative for chest pain and palpitations.  Gastrointestinal: Negative for abdominal pain, blood in stool and bowel incontinence.  Genitourinary: Negative  for bladder incontinence, dysuria, frequency and hematuria.  Musculoskeletal: Positive for back pain and arthralgias.  Skin: Negative.   Neurological: Negative for dizziness, seizures and speech difficulty.  Psychiatric/Behavioral: Negative for hallucinations and confusion.    Allergies  Bee venom; Amitriptyline; and Tramadol  Home Medications   Current Outpatient Rx  Name  Route  Sig  Dispense  Refill  . ibuprofen (ADVIL,MOTRIN) 200 MG tablet   Oral   Take 400 mg by mouth every 6 (six) hours as needed. For sinus headaches         . metoprolol tartrate (LOPRESSOR) 25 MG tablet   Oral   Take 25 mg by mouth daily.           BP 155/62  Pulse 78  Temp(Src) 98 F (36.7 C) (Oral)  Resp 17  Ht 5\' 4"  (1.626 m)  Wt 220 lb (99.791 kg)  BMI 37.74 kg/m2  SpO2 97%  Physical Exam  Nursing note and vitals reviewed. Constitutional: She is oriented to person, place, and time. She appears well-developed and well-nourished.  Non-toxic appearance.  HENT:  Head: Normocephalic.  Right Ear: Tympanic membrane and external ear normal.  Left Ear: Tympanic membrane and external ear normal.  Eyes: EOM and lids are normal. Pupils are equal, round, and reactive to light.  Neck: Normal range of motion. Neck supple. Carotid bruit is  not present.  Cardiovascular: Normal rate, regular rhythm, normal heart sounds, intact distal pulses and normal pulses.   Pulmonary/Chest: Breath sounds normal. No respiratory distress.  Abdominal: Soft. Bowel sounds are normal. There is no tenderness. There is no guarding.  Musculoskeletal: Normal range of motion.  Severe spasm of the lumbar area pain with movement of the lower extremities.  Lymphadenopathy:       Head (right side): No submandibular adenopathy present.       Head (left side): No submandibular adenopathy present.    She has no cervical adenopathy.  Neurological: She is alert and oriented to person, place, and time. She has normal strength. No  cranial nerve deficit or sensory deficit.  Skin: Skin is warm and dry.  Psychiatric: She has a normal mood and affect. Her speech is normal.    ED Course  Procedures (including critical care time)  Labs Reviewed - No data to display No results found. Pulse ox 97% on room air. WNL by my interpretation.  No diagnosis found.    MDM  I have reviewed nursing notes, vital signs, and all appropriate lab and imaging results for this patient.  Pt presents to ED with acute on chronic exacerbation of the chronic back pain. No gross neuro changes. Gait slow but steady. No reported loss of bowel or bladder. Plan - Rx for decadron robaxin and norco#20 tabs. Pt to apply heat and see orthopedics for additional evaluation.      Kathie Dike, PA-C 12/28/12 1535

## 2012-12-23 NOTE — ED Notes (Signed)
Lumbar spine back spasms began today.

## 2012-12-28 NOTE — ED Provider Notes (Signed)
Medical screening examination/treatment/procedure(s) were performed by non-physician practitioner and as supervising physician I was immediately available for consultation/collaboration. Devoria Albe, MD, Armando Gang   Ward Givens, MD 12/28/12 [phone]

## 2013-03-10 ENCOUNTER — Encounter (HOSPITAL_COMMUNITY): Payer: Self-pay | Admitting: Emergency Medicine

## 2013-03-10 ENCOUNTER — Emergency Department (HOSPITAL_COMMUNITY)
Admission: EM | Admit: 2013-03-10 | Discharge: 2013-03-10 | Disposition: A | Discharge status: Home / Self Care | Payer: Self-pay | Attending: Emergency Medicine | Admitting: Emergency Medicine

## 2013-03-10 DIAGNOSIS — Z87891 Personal history of nicotine dependence: Secondary | ICD-10-CM | POA: Insufficient documentation

## 2013-03-10 DIAGNOSIS — M25569 Pain in unspecified knee: Secondary | ICD-10-CM | POA: Insufficient documentation

## 2013-03-10 DIAGNOSIS — J40 Bronchitis, not specified as acute or chronic: Secondary | ICD-10-CM | POA: Insufficient documentation

## 2013-03-10 DIAGNOSIS — M129 Arthropathy, unspecified: Secondary | ICD-10-CM | POA: Insufficient documentation

## 2013-03-10 DIAGNOSIS — Z79899 Other long term (current) drug therapy: Secondary | ICD-10-CM | POA: Insufficient documentation

## 2013-03-10 DIAGNOSIS — M1710 Unilateral primary osteoarthritis, unspecified knee: Secondary | ICD-10-CM | POA: Insufficient documentation

## 2013-03-10 DIAGNOSIS — G8929 Other chronic pain: Secondary | ICD-10-CM | POA: Insufficient documentation

## 2013-03-10 DIAGNOSIS — IMO0002 Reserved for concepts with insufficient information to code with codable children: Secondary | ICD-10-CM | POA: Insufficient documentation

## 2013-03-10 DIAGNOSIS — I10 Essential (primary) hypertension: Secondary | ICD-10-CM | POA: Insufficient documentation

## 2013-03-10 HISTORY — DX: Other chronic pain: G89.29

## 2013-03-10 HISTORY — DX: Pain in unspecified knee: M25.569

## 2013-03-10 MED ORDER — HYDROMORPHONE HCL PF 1 MG/ML IJ SOLN
1.0000 mg | Freq: Once | INTRAMUSCULAR | Status: AC
  Administered 2013-03-10: 1 mg via INTRAMUSCULAR
  Filled 2013-03-10: qty 1

## 2013-03-10 MED ORDER — OXYCODONE-ACETAMINOPHEN 5-325 MG PO TABS: ORAL_TABLET | ORAL | Status: DC

## 2013-03-10 NOTE — ED Provider Notes (Signed)
History     CSN: [id number]  Arrival date & time 03/10/13  2056   First MD Initiated Contact with Patient 03/10/13 2125      Chief Complaint  Patient presents with  . Knee Pain     HPI Pt was seen at 2135.   Per pt and her family, c/o gradual onset and persistence of constant acute flair of her chronic bilat knees "pain" that began this morning. Pt states the pain became worse after she was "sitting in a chair watching a baseball game" this evening PTA.  Pt also states that she ran out of her usual chronic pain meds several days ago. Denies any change in her usual chronic pain pattern. Denies injury, no rash, no fevers, no edema, no focal motor weakness, no tingling/numbness in extremities, no calf pain or unilateral swelling.    Past Medical History  Diagnosis Date  . Arthritis   . Bronchitis   . Hypertension   . Chronic back pain   . Chronic knee pain     Past Surgical History  Procedure Laterality Date  . Knee surgery    . Appendectomy    . Tonsillectomy      Family History  Problem Relation Age of Onset  . Heart failure Father   . Diabetes Father   . Kidney failure Father     History  Substance Use Topics  . Smoking status: Former Smoker -- 0.50 packs/day    Quit date: 02/26/2012  . Smokeless tobacco: Not on file  . Alcohol Use: No    OB History   Grav Para Term Preterm Abortions TAB SAB Ect Mult Living   2 2 2              Review of Systems ROS: Statement: All systems negative except as marked or noted in the HPI; Constitutional: Negative for fever and chills. ; ; Eyes: Negative for eye pain, redness and discharge. ; ; ENMT: Negative for ear pain, hoarseness, nasal congestion, sinus pressure and sore throat. ; ; Cardiovascular: Negative for chest pain, palpitations, diaphoresis, dyspnea and peripheral edema. ; ; Respiratory: Negative for cough, wheezing and stridor. ; ; Gastrointestinal: Negative for nausea, vomiting, diarrhea, abdominal pain, blood in  stool, hematemesis, jaundice and rectal bleeding. . ; ; Genitourinary: Negative for dysuria, flank pain and hematuria. ; ; Musculoskeletal: +bilat knees pain. Negative for back pain and neck pain. Negative for swelling and trauma.; ; Skin: Negative for pruritus, rash, abrasions, blisters, bruising and skin lesion.; ; Neuro: Negative for headache, lightheadedness and neck stiffness. Negative for weakness, altered level of consciousness , altered mental status, extremity weakness, paresthesias, involuntary movement, seizure and syncope.       Allergies  Bee venom; Amitriptyline; and Tramadol  Home Medications   Current Outpatient Rx  Name  Route  Sig  Dispense  Refill  . albuterol (PROVENTIL HFA;VENTOLIN HFA) 108 (90 BASE) MCG/ACT inhaler   Inhalation   Inhale 2 puffs into the lungs every 6 (six) hours as needed for wheezing or shortness of breath.         . cyclobenzaprine (FLEXERIL) 5 MG tablet   Oral   Take 5 mg by mouth 3 (three) times daily as needed for muscle spasms.         Marland Kitchen HYDROcodone-acetaminophen (NORCO) 10-325 MG per tablet   Oral   Take 1 tablet by mouth every 6 (six) hours as needed for pain. Ran out         . ibuprofen (  ADVIL,MOTRIN) 200 MG tablet   Oral   Take 400 mg by mouth every 6 (six) hours as needed for pain. For sinus headaches         . metoprolol tartrate (LOPRESSOR) 25 MG tablet   Oral   Take 25 mg by mouth daily.         Marland Kitchen oxyCODONE-acetaminophen (PERCOCET/ROXICET) 5-325 MG per tablet      1 or 2 tabs PO q6h prn pain   20 tablet   0     BP 171/69  Pulse 74  Temp(Src) 97.6 F (36.4 C) (Oral)  Resp 22  Ht 5\' 4"  (1.626 m)  Wt 220 lb (99.791 kg)  BMI 37.74 kg/m2  SpO2 99%  Physical Exam 2140: Physical examination:  Nursing notes reviewed; Vital signs and O2 SAT reviewed;  Constitutional: Well developed, Well nourished, Well hydrated, Uncomfortable appearing; Head:  Normocephalic, atraumatic; Eyes: EOMI, PERRL, No scleral icterus;  ENMT: Mouth and pharynx normal, Mucous membranes moist; Neck: Supple, Full range of motion, No lymphadenopathy; Cardiovascular: Regular rate and rhythm, No murmur, rub, or gallop; Respiratory: Breath sounds clear & equal bilaterally, No rales, rhonchi, wheezes.  Speaking full sentences with ease, Normal respiratory effort/excursion; Chest: Nontender, Movement normal; Abdomen: Soft, Nontender, Nondistended, Normal bowel sounds; Genitourinary: No CVA tenderness; Extremities: Pulses normal, +generalized TTP bilat knees.  +FROM bilat knees, including able to lift extended bilat LE off stretcher, and extend bilat lower legs against resistance.  No ligamentous laxity.  No patellar or quad tendon step-offs.  NMS intact bilat feet, strong pedal pp. +plantarflexion of right and left feet w/calf squeeze.  No palpable gap right or left Achilles's tendon.  No proximal fibular head tenderness.  No edema, erythema, warmth, ecchymosis or deformity to bilat knees.  No specific area of point tenderness.  No calf tenderness, edema or asymmetry.; Neuro: AA&Ox3, Major CN grossly intact.  Speech clear. No gross focal motor or sensory deficits in extremities.; Skin: Color normal, Warm, Dry.   ED Course  Procedures    MDM  MDM Reviewed: previous chart, nursing note and vitals Reviewed previous: x-ray   2145:  Long hx of chronic pain with multiple ED visits for same. Known DJD bilat knees and endorses she ran out of her pain meds. Pt endorses acute flair of her usual long standing chronic pain today, no change from her usual chronic pain pattern.  Pt encouraged to f/u with her PMD and Pain Management doctor for good continuity of care and control of her chronic pain.  Verb understanding.           Laray Anger, DO 03/13/13 [phone]

## 2013-03-10 NOTE — ED Notes (Signed)
Patient c/o bilateral knee pain x 2 hours.

## 2013-06-20 ENCOUNTER — Encounter (HOSPITAL_COMMUNITY): Payer: Self-pay | Admitting: *Deleted

## 2013-06-20 ENCOUNTER — Emergency Department (HOSPITAL_COMMUNITY): Payer: Medicare Other

## 2013-06-20 ENCOUNTER — Inpatient Hospital Stay (HOSPITAL_COMMUNITY)
Admission: EM | Admit: 2013-06-20 | Discharge: 2013-06-21 | DRG: 191 | Disposition: A | Discharge status: Home / Self Care | Payer: Medicare Other | Attending: Internal Medicine | Admitting: Internal Medicine

## 2013-06-20 DIAGNOSIS — Z87891 Personal history of nicotine dependence: Secondary | ICD-10-CM

## 2013-06-20 DIAGNOSIS — M129 Arthropathy, unspecified: Secondary | ICD-10-CM | POA: Diagnosis present

## 2013-06-20 DIAGNOSIS — G8929 Other chronic pain: Secondary | ICD-10-CM | POA: Diagnosis present

## 2013-06-20 DIAGNOSIS — R059 Cough, unspecified: Secondary | ICD-10-CM

## 2013-06-20 DIAGNOSIS — I10 Essential (primary) hypertension: Secondary | ICD-10-CM | POA: Diagnosis present

## 2013-06-20 DIAGNOSIS — E669 Obesity, unspecified: Secondary | ICD-10-CM | POA: Diagnosis present

## 2013-06-20 DIAGNOSIS — R0602 Shortness of breath: Secondary | ICD-10-CM

## 2013-06-20 DIAGNOSIS — R05 Cough: Secondary | ICD-10-CM

## 2013-06-20 DIAGNOSIS — M549 Dorsalgia, unspecified: Secondary | ICD-10-CM | POA: Diagnosis present

## 2013-06-20 DIAGNOSIS — J441 Chronic obstructive pulmonary disease with (acute) exacerbation: Principal | ICD-10-CM

## 2013-06-20 DIAGNOSIS — F410 Panic disorder [episodic paroxysmal anxiety] without agoraphobia: Secondary | ICD-10-CM

## 2013-06-20 DIAGNOSIS — Z6841 Body Mass Index (BMI) 40.0 and over, adult: Secondary | ICD-10-CM

## 2013-06-20 DIAGNOSIS — M25569 Pain in unspecified knee: Secondary | ICD-10-CM | POA: Diagnosis present

## 2013-06-20 DIAGNOSIS — F411 Generalized anxiety disorder: Secondary | ICD-10-CM | POA: Diagnosis present

## 2013-06-20 DIAGNOSIS — Z23 Encounter for immunization: Secondary | ICD-10-CM

## 2013-06-20 HISTORY — DX: Chronic obstructive pulmonary disease, unspecified: J44.9

## 2013-06-20 LAB — BASIC METABOLIC PANEL
BUN: 11 mg/dL (ref 6–23)
Chloride: 102 mEq/L (ref 96–112)
GFR calc Af Amer: >90 mL/min (ref >90)
GFR calc non Af Amer: >90 mL/min (ref >90)
Potassium: 3.9 mEq/L (ref 3.5–5.1)
Sodium: 140 mEq/L (ref 135–145)

## 2013-06-20 LAB — CBC WITH DIFFERENTIAL/PLATELET
Basophils Relative: 0 % (ref 0–1)
Eosinophils Absolute: 0.3 10*3/uL (ref 0.0–0.7)
Hemoglobin: 14.5 g/dL (ref 12.0–15.0)
Lymphs Abs: 8.1 10*3/uL — ABNORMAL HIGH (ref 0.7–4.0)
MCH: 29.8 pg (ref 26.0–34.0)
MCHC: 32.3 g/dL (ref 30.0–36.0)
Monocytes Relative: 8 % (ref 3–12)
Neutro Abs: 6.5 10*3/uL (ref 1.7–7.7)
Neutrophils Relative %: 40 % — ABNORMAL LOW (ref 43–77)
Platelets: 279 10*3/uL (ref 150–400)
RBC: 4.87 MIL/uL (ref 3.87–5.11)

## 2013-06-20 MED ORDER — POLYETHYLENE GLYCOL 3350 17 G PO PACK: 17.0000 g | PACK | Freq: Every day | ORAL | Status: DC | PRN

## 2013-06-20 MED ORDER — ONDANSETRON HCL 4 MG/2ML IJ SOLN: 4.0000 mg | INTRAMUSCULAR | Status: DC | PRN

## 2013-06-20 MED ORDER — ACETAMINOPHEN 650 MG RE SUPP: 650.0000 mg | Freq: Four times a day (QID) | RECTAL | Status: DC | PRN

## 2013-06-20 MED ORDER — POTASSIUM CHLORIDE IN NACL 20-0.9 MEQ/L-% IV SOLN
INTRAVENOUS | Status: DC
  Administered 2013-06-20: 23:00:00 via INTRAVENOUS

## 2013-06-20 MED ORDER — IPRATROPIUM BROMIDE 0.02 % IN SOLN
0.5000 mg | Freq: Once | RESPIRATORY_TRACT | Status: AC
  Administered 2013-06-20: 0.5 mg via RESPIRATORY_TRACT
  Filled 2013-06-20: qty 2.5

## 2013-06-20 MED ORDER — GUAIFENESIN ER 600 MG PO TB12
1200.0000 mg | ORAL_TABLET | Freq: Two times a day (BID) | ORAL | Status: DC
  Administered 2013-06-20 – 2013-06-21 (×2): 1200 mg via ORAL
  Filled 2013-06-20 (×2): qty 2

## 2013-06-20 MED ORDER — METHYLPREDNISOLONE SODIUM SUCC 125 MG IJ SOLR
125.0000 mg | Freq: Four times a day (QID) | INTRAMUSCULAR | Status: DC
  Administered 2013-06-21 (×2): 125 mg via INTRAVENOUS
  Filled 2013-06-20 (×2): qty 2

## 2013-06-20 MED ORDER — FLEET ENEMA 7-19 GM/118ML RE ENEM: 1.0000 | ENEMA | Freq: Once | RECTAL | Status: AC | PRN

## 2013-06-20 MED ORDER — ALBUTEROL SULFATE (5 MG/ML) 0.5% IN NEBU: 2.5000 mg | INHALATION_SOLUTION | RESPIRATORY_TRACT | Status: DC | PRN

## 2013-06-20 MED ORDER — ALBUTEROL SULFATE (5 MG/ML) 0.5% IN NEBU
2.5000 mg | INHALATION_SOLUTION | Freq: Once | RESPIRATORY_TRACT | Status: AC
  Administered 2013-06-20: 2.5 mg via RESPIRATORY_TRACT
  Filled 2013-06-20: qty 0.5

## 2013-06-20 MED ORDER — METHYLPREDNISOLONE SODIUM SUCC 125 MG IJ SOLR
125.0000 mg | Freq: Once | INTRAMUSCULAR | Status: AC
  Administered 2013-06-20: 125 mg via INTRAVENOUS
  Filled 2013-06-20: qty 2

## 2013-06-20 MED ORDER — BISACODYL 10 MG RE SUPP: 10.0000 mg | Freq: Every day | RECTAL | Status: DC | PRN

## 2013-06-20 MED ORDER — ACETAMINOPHEN 325 MG PO TABS
650.0000 mg | ORAL_TABLET | Freq: Once | ORAL | Status: AC
  Administered 2013-06-20: 650 mg via ORAL
  Filled 2013-06-20: qty 2

## 2013-06-20 MED ORDER — ENOXAPARIN SODIUM 40 MG/0.4ML ~~LOC~~ SOLN
40.0000 mg | SUBCUTANEOUS | Status: DC
  Administered 2013-06-20: 40 mg via SUBCUTANEOUS
  Filled 2013-06-20: qty 0.4

## 2013-06-20 MED ORDER — ACETAMINOPHEN 325 MG PO TABS
650.0000 mg | ORAL_TABLET | Freq: Four times a day (QID) | ORAL | Status: DC | PRN
  Administered 2013-06-21: 650 mg via ORAL
  Filled 2013-06-20: qty 2

## 2013-06-20 MED ORDER — IPRATROPIUM BROMIDE 0.02 % IN SOLN
0.5000 mg | RESPIRATORY_TRACT | Status: DC
  Administered 2013-06-21 (×2): 0.5 mg via RESPIRATORY_TRACT
  Filled 2013-06-20 (×2): qty 2.5

## 2013-06-20 MED ORDER — ALBUTEROL SULFATE (5 MG/ML) 0.5% IN NEBU
2.5000 mg | INHALATION_SOLUTION | RESPIRATORY_TRACT | Status: DC
  Administered 2013-06-21 (×2): 2.5 mg via RESPIRATORY_TRACT
  Filled 2013-06-20 (×2): qty 0.5

## 2013-06-20 NOTE — ED Notes (Signed)
Pt had an emotional episode, became SOB, EMS started IV, gave 1 breathing tx en route

## 2013-06-20 NOTE — ED Provider Notes (Addendum)
Scribed for Carla Booze, MD, the patient was seen in room APA01/APA01. This chart was scribed by Lewanda Rife, ED scribe. Patient's care was started at 1847   CSN: [id number]     Arrival date & time 06/20/13  1819 History   First MD Initiated Contact with Patient 06/20/13 1840     Chief Complaint  Patient presents with  . Shortness of Breath  . Panic Attack   (Consider location/radiation/quality/duration/timing/severity/associated sxs/prior Treatment) The history is provided by the patient and the EMS personnel.   HPI Comments: Carla [name] is a 62 y.o. female brought in by ambulance, who presents to the Emergency Department complaining of shortness of breath onset PTA. Reports associated worsening non-productive cough onset 4 days. Reports associated chest tightness. Denies associated chills, sore throat, chest pain, rhinorrhea, fever, and sweats. Denies any aggravating factors. Reports taking benadryl, and multi cold symptoms OTC medicine. Reports mild relief of symptoms with albuterol given by EMS. Reports running out of albuterol 2 days ago at home, but reports moderate relief when she had it. Reports she quit smoking cigarettes. PCP Dr. Loney Hering   Per EMS given 1 albuterol txt on route to ED.   Past Medical History  Diagnosis Date  . Arthritis   . Bronchitis   . Hypertension   . Chronic back pain   . Chronic knee pain    Past Surgical History  Procedure Laterality Date  . Knee surgery    . Appendectomy    . Tonsillectomy     Family History  Problem Relation Age of Onset  . Heart failure Father   . Diabetes Father   . Kidney failure Father    History  Substance Use Topics  . Smoking status: Former Smoker -- 0.50 packs/day    Quit date: 02/26/2012  . Smokeless tobacco: Not on file  . Alcohol Use: No   OB History   Grav Para Term Preterm Abortions TAB SAB Ect Mult Living   2 2 2             Review of Systems  Respiratory: Positive for shortness of breath.     A complete 10 system review of systems was obtained and all systems are negative except as noted in the HPI and PMH.  Allergies  Bee venom; Amitriptyline; and Tramadol  Home Medications   Current Outpatient Rx  Name  Route  Sig  Dispense  Refill  . albuterol (PROVENTIL HFA;VENTOLIN HFA) 108 (90 BASE) MCG/ACT inhaler   Inhalation   Inhale 2 puffs into the lungs every 6 (six) hours as needed for wheezing or shortness of breath.         . cyclobenzaprine (FLEXERIL) 5 MG tablet   Oral   Take 5 mg by mouth 3 (three) times daily as needed for muscle spasms.         Marland Kitchen HYDROcodone-acetaminophen (NORCO) 10-325 MG per tablet   Oral   Take 1 tablet by mouth every 6 (six) hours as needed for pain. Ran out         . ibuprofen (ADVIL,MOTRIN) 200 MG tablet   Oral   Take 400 mg by mouth every 6 (six) hours as needed for pain. For sinus headaches         . metoprolol tartrate (LOPRESSOR) 25 MG tablet   Oral   Take 25 mg by mouth daily.         Marland Kitchen oxyCODONE-acetaminophen (PERCOCET/ROXICET) 5-325 MG per tablet      1 or 2  tabs PO q6h prn pain   20 tablet   0    BP 162/70  Pulse 72  Temp(Src) 98.2 F (36.8 C) (Oral)  Resp 32  Ht 5\' 3"  (1.6 m)  Wt 220 lb (99.791 kg)  BMI 38.98 kg/m2  SpO2 99% Physical Exam  Nursing note and vitals reviewed. Constitutional: She is oriented to person, place, and time. She appears well-developed and well-nourished. No distress.  Mild respiratory distress with tachypnea   HENT:  Head: Normocephalic and atraumatic.  Eyes: Conjunctivae and EOM are normal.  Neck: Neck supple. No tracheal deviation present.  Cardiovascular: Normal rate and regular rhythm.   No murmur heard. Pulmonary/Chest: Tachypnea noted. She is in respiratory distress (mild ). She has rales.  Decreased air movement. Diffuse expiratory wheezes and mild bibasilar rales   Abdominal: Soft. Bowel sounds are normal. There is no tenderness.  Musculoskeletal: Normal range of  motion. She exhibits no edema.  Neurological: She is alert and oriented to person, place, and time.  Skin: Skin is warm and dry.  Psychiatric: She has a normal mood and affect. Her behavior is normal.    ED Course  Procedures (including critical care time) Medications  methylPREDNISolone sodium succinate (SOLU-MEDROL) 125 mg/2 mL injection 125 mg (not administered)  albuterol (PROVENTIL) (5 MG/ML) 0.5% nebulizer solution 2.5 mg (not administered)  ipratropium (ATROVENT) nebulizer solution 0.5 mg (not administered)  acetaminophen (TYLENOL) tablet 650 mg (not administered)  albuterol (PROVENTIL) (5 MG/ML) 0.5% nebulizer solution 2.5 mg (2.5 mg Nebulization Given 06/20/13 1846)  ipratropium (ATROVENT) nebulizer solution 0.5 mg (0.5 mg Nebulization Given 06/20/13 1846)  7:02 PM reports moderate improvement after second breathing treatment   Labs Review Results for orders placed during the hospital encounter of 06/20/13  CBC WITH DIFFERENTIAL      Result Value Range   WBC 16.3 (*) 4.0 - 10.5 K/uL   RBC 4.87  3.87 - 5.11 MIL/uL   Hemoglobin 14.5  12.0 - 15.0 g/dL   HCT 16.1  09.6 - 04.5 %   MCV 92.2  78.0 - 100.0 fL   MCH 29.8  26.0 - 34.0 pg   MCHC 32.3  30.0 - 36.0 g/dL   RDW 40.9  81.1 - 91.4 %   Platelets 279  150 - 400 K/uL   Neutrophils Relative % 40 (*) 43 - 77 %   Neutro Abs 6.5  1.7 - 7.7 K/uL   Lymphocytes Relative 50 (*) 12 - 46 %   Lymphs Abs 8.1 (*) 0.7 - 4.0 K/uL   Monocytes Relative 8  3 - 12 %   Monocytes Absolute 1.4 (*) 0.1 - 1.0 K/uL   Eosinophils Relative 2  0 - 5 %   Eosinophils Absolute 0.3  0.0 - 0.7 K/uL   Basophils Relative 0  0 - 1 %   Basophils Absolute 0.0  0.0 - 0.1 K/uL  BASIC METABOLIC PANEL      Result Value Range   Sodium 140  135 - 145 mEq/L   Potassium 3.9  3.5 - 5.1 mEq/L   Chloride 102  96 - 112 mEq/L   CO2 28  19 - 32 mEq/L   Glucose, Bld 107 (*) 70 - 99 mg/dL   BUN 11  6 - 23 mg/dL   Creatinine, Ser 7.82  0.50 - 1.10 mg/dL   Calcium 9.4   8.4 - 95.6 mg/dL   GFR calc non Af Amer >90  >90 mL/min   GFR calc Af Amer >90  >  90 mL/min   Imaging Review Dg Chest 1 View  06/20/2013   *RADIOLOGY REPORT*  Clinical Data: Shortness of breath  CHEST - 1 VIEW  Comparison: Chest CT angiogram Mar 08, 2012 and chest radiograph Mar 08, 2012  Findings: Lungs clear.  Heart is upper normal in size with normal pulmonary vascularity.  No adenopathy.  No bone lesions.  IMPRESSION: No edema or consolidation.   Original Report Authenticated By: Bretta Bang, M.D.    Date: 06/20/2013  Rate: 68  Rhythm: normal sinus rhythm  QRS Axis: normal  Intervals: normal  ST/T Wave abnormalities: normal  Conduction Disutrbances:none  Narrative Interpretation: Old anteroseptal myocardial infarction. When compared with ECG of 03/08/2012, no significant changes are seen.  Old EKG Reviewed: unchanged   MDM   1. COPD exacerbation    Exacerbation of COPD. She's received 2 breathing treatments and is still having considerable wheezing and still in mild distress. She'll be given methylprednisolone and that third nebulizer treatment and reassessed.  She is somewhat improved following the third treatment but still has moderate wheezing and will need to be admitted. Case is discussed with Dr. Orvan Falconer [facility] who agrees to admit the patient.  I personally performed the services described in this documentation, which was scribed in my presence. The recorded information has been reviewed and is accurate.   Carla Booze, MD 06/20/13 2031  Carla Booze, MD 06/20/13 2218

## 2013-06-20 NOTE — H&P (Signed)
[facility] History and Physical  Carla [name]  ZOX:[id number]  DOB: [birth year]   DOA: 06/20/2013   PCP:   [name], MD   Chief Complaint:  Difficulty breathing for 4 days  HPI: Carla [name] is a 62 y.o. female.   Obese Caucasian lady with a history of COPD, otherwise been using her daughter's albuterol inhaler but ran out to 2-1/2 days ago, presents with the above complaints. She reports grandchildren and others in the house of been sick with upper respiratory symptoms but she got it in her chest and has been wheezing and coughing.  She does not have her own medications because she lost her job and somehow lost Medicaid with it.  In the emergency room she received nebulizations and improved somewhat but continues to wheeze and is admitted for further control  Rewiew of Systems:   All systems negative except as marked bold or noted in the HPI;  Constitutional:    malaise, fever and chills. ;  Eyes:   eye pain, redness and discharge. ;  ENMT:   ear pain, hoarseness, nasal congestion, sinus pressure and sore throat. ;  Cardiovascular:    chest pain, palpitations, diaphoresis, dyspnea and peripheral edema.  Respiratory:   cough, hemoptysis, wheezing and stridor. ;  Gastrointestinal:  nausea, vomiting, diarrhea, constipation, abdominal pain, melena, blood in stool, hematemesis, jaundice and rectal bleeding. unusual weight loss..   Genitourinary:    frequency, dysuria, incontinence,flank pain and hematuria; Musculoskeletal:   back pain and neck pain.  swelling and trauma.;  Skin: .  pruritus, rash, abrasions, bruising and skin lesion.; ulcerations Neuro:    headache, lightheadedness and neck stiffness.  weakness, altered level of consciousness, altered mental status, extremity weakness, burning feet, involuntary movement, seizure and syncope.  Psych:    anxiety, depression, insomnia, tearfulness, panic attacks, hallucinations, paranoia, suicidal or homicidal ideation     Past Medical History  Diagnosis Date  . Arthritis   . Bronchitis   . Hypertension   . Chronic back pain   . Chronic knee pain   . COPD (chronic obstructive pulmonary disease)     Past Surgical History  Procedure Laterality Date  . Knee surgery    . Appendectomy    . Tonsillectomy      Medications:  HOME MEDS: Prior to Admission medications   Not on File     Allergies:  Allergies  Allergen Reactions  . Bee Venom Swelling  . Amitriptyline     Has nightmares when using  . Nsaids Nausea And Vomiting  . Toradol [Ketorolac Tromethamine] Nausea And Vomiting  . Tramadol Nausea And Vomiting    Social History:   reports that she quit smoking about 15 months ago. She does not have any smokeless tobacco history on file. She reports that she does not drink alcohol or use illicit drugs.  Family History: Family History  Problem Relation Age of Onset  . Heart failure Father   . Diabetes Father   . Kidney failure Father      Physical Exam: Filed Vitals:   06/20/13 1920 06/20/13 1930 06/20/13 2046 06/20/13 2127  BP:   130/53 169/85  Pulse:   75 73  Temp:   97.9 F (36.6 C) 98 F (36.7 C)  TempSrc:   Oral   Resp:   16 24  Height:    5\' 4"  (1.626 m)  Weight:    112.2 kg (247 lb 5.7 oz)  SpO2: 96% 99% 97% 99%   Blood pressure  169/85, pulse 73, temperature 98 F (36.7 C), temperature source Oral, resp. rate 24, height 5\' 4"  (1.626 m), weight 112.2 kg (247 lb 5.7 oz), SpO2 99.00%. Body mass index is 42.44 kg/(m^2).   GEN:  Pleasant obese Caucasian lady lying in bed breathing rapidly ; coughing cooperative with exam PSYCH:  alert and oriented x4;  anxious; affect is appropriate. HEENT: Mucous membranes pink and anicteric; PERRLA; EOM intact; no cervical lymphadenopathy nor thyromegaly or carotid bruit; no JVD; thick neck Breasts:: Not examined CHEST WALL: No tenderness CHEST: Tachypnea, diffuse rhonchi bilaterally HEART: Regular rate and rhythm; no murmurs rubs  or gallops BACK: Mild kyphosis no scoliosis; no CVA tenderness ABDOMEN: Obese, soft non-tender; no masses, no organomegaly, normal abdominal bowel sounds; moderate pannus; no intertriginous candida. Rectal Exam: Not done EXTREMITIES:  age-appropriate arthropathy of the hands and knees; no edema; no ulcerations. Genitalia: not examined PULSES: 2+ and symmetric SKIN: Normal hydration no rash or ulceration CNS: Cranial nerves 2-12 grossly intact no focal lateralizing neurologic deficit   Labs on Admission:  Basic Metabolic Panel:  Recent Labs Lab 06/20/13 1915  NA 140  K 3.9  CL 102  CO2 28  GLUCOSE 107*  BUN 11  CREATININE 0.57  CALCIUM 9.4   Liver Function Tests: No results found for this basename: AST, ALT, ALKPHOS, BILITOT, PROT, ALBUMIN,  in the last 168 hours No results found for this basename: LIPASE, AMYLASE,  in the last 168 hours No results found for this basename: AMMONIA,  in the last 168 hours CBC:  Recent Labs Lab 06/20/13 1915  WBC 16.3*  NEUTROABS 6.5  HGB 14.5  HCT 44.9  MCV 92.2  PLT 279   Cardiac Enzymes: No results found for this basename: CKTOTAL, CKMB, CKMBINDEX, TROPONINI,  in the last 168 hours BNP: No components found with this basename: POCBNP,  D-dimer: No components found with this basename: D-DIMER,  CBG: No results found for this basename: GLUCAP,  in the last 168 hours  Radiological Exams on Admission: Dg Chest 1 View  06/20/2013   *RADIOLOGY REPORT*  Clinical Data: Shortness of breath  CHEST - 1 VIEW  Comparison: Chest CT angiogram Mar 08, 2012 and chest radiograph Mar 08, 2012  Findings: Lungs clear.  Heart is upper normal in size with normal pulmonary vascularity.  No adenopathy.  No bone lesions.  IMPRESSION: No edema or consolidation.   Original Report Authenticated By: Bretta Bang, M.D.      Assessment/Plan   Active Problems:   COPD exacerbation  obese   PLAN: Admit for nebulization, intravenous steroids, empiric  antibiotic, gentle hydration Other plans as per orders.  Code Status: Full code Family Communication: No family at bedside Disposition Plan:likely home in a day or 2   Carla [name] Nocturnist [facility] Pager [phone]   06/20/2013, 10:12 PM

## 2013-06-21 DIAGNOSIS — R059 Cough, unspecified: Secondary | ICD-10-CM

## 2013-06-21 DIAGNOSIS — R05 Cough: Secondary | ICD-10-CM

## 2013-06-21 DIAGNOSIS — F410 Panic disorder [episodic paroxysmal anxiety] without agoraphobia: Secondary | ICD-10-CM

## 2013-06-21 LAB — URINALYSIS, ROUTINE W REFLEX MICROSCOPIC
Glucose, UA: >1000 mg/dL — AB
Ketones, ur: NEGATIVE mg/dL
Protein, ur: NEGATIVE mg/dL
Urobilinogen, UA: 0.2 mg/dL (ref 0.0–1.0)

## 2013-06-21 LAB — COMPREHENSIVE METABOLIC PANEL
AST: 20 U/L (ref 0–37)
Albumin: 3.6 g/dL (ref 3.5–5.2)
Alkaline Phosphatase: 127 U/L — ABNORMAL HIGH (ref 39–117)
BUN: 10 mg/dL (ref 6–23)
CO2: 18 mEq/L — ABNORMAL LOW (ref 19–32)
Chloride: 102 mEq/L (ref 96–112)
GFR calc non Af Amer: >90 mL/min (ref >90)
Potassium: 4 mEq/L (ref 3.5–5.1)
Total Bilirubin: 0.2 mg/dL — ABNORMAL LOW (ref 0.3–1.2)

## 2013-06-21 LAB — CBC
HCT: 46.6 % — ABNORMAL HIGH (ref 36.0–46.0)
Hemoglobin: 15.1 g/dL — ABNORMAL HIGH (ref 12.0–15.0)
RBC: 5.06 MIL/uL (ref 3.87–5.11)
RDW: 13.5 % (ref 11.5–15.5)
WBC: 14.5 10*3/uL — ABNORMAL HIGH (ref 4.0–10.5)

## 2013-06-21 LAB — HEMOGLOBIN A1C: Mean Plasma Glucose: 148 mg/dL — ABNORMAL HIGH (ref <117)

## 2013-06-21 MED ORDER — GUAIFENESIN-CODEINE 100-10 MG/5ML PO SOLN: 5.0000 mL | Freq: Three times a day (TID) | ORAL | Status: DC | PRN

## 2013-06-21 MED ORDER — PREDNISONE 20 MG PO TABS: ORAL_TABLET | ORAL | Status: DC

## 2013-06-21 MED ORDER — ALBUTEROL SULFATE HFA 108 (90 BASE) MCG/ACT IN AERS: 2.0000 | INHALATION_SPRAY | Freq: Four times a day (QID) | RESPIRATORY_TRACT | Status: AC | PRN

## 2013-06-21 MED ORDER — ALPRAZOLAM 0.5 MG PO TABS: 0.5000 mg | ORAL_TABLET | Freq: Every evening | ORAL | Status: DC | PRN

## 2013-06-21 MED ORDER — AZITHROMYCIN 250 MG PO TABS: 250.0000 mg | ORAL_TABLET | Freq: Every day | ORAL | Status: DC

## 2013-06-21 MED ORDER — INFLUENZA VAC SPLIT QUAD 0.5 ML IM SUSP
0.5000 mL | Freq: Once | INTRAMUSCULAR | Status: AC
  Administered 2013-06-21: 0.5 mL via INTRAMUSCULAR
  Filled 2013-06-21: qty 0.5

## 2013-06-21 MED ORDER — IPRATROPIUM BROMIDE 0.02 % IN SOLN
0.5000 mg | Freq: Three times a day (TID) | RESPIRATORY_TRACT | Status: DC
  Administered 2013-06-21: 0.5 mg via RESPIRATORY_TRACT
  Filled 2013-06-21: qty 2.5

## 2013-06-21 MED ORDER — ALBUTEROL SULFATE (5 MG/ML) 0.5% IN NEBU
2.5000 mg | INHALATION_SOLUTION | Freq: Three times a day (TID) | RESPIRATORY_TRACT | Status: DC
  Administered 2013-06-21: 2.5 mg via RESPIRATORY_TRACT
  Filled 2013-06-21: qty 0.5

## 2013-06-21 MED ORDER — MORPHINE SULFATE 2 MG/ML IJ SOLN
1.0000 mg | Freq: Once | INTRAMUSCULAR | Status: AC
  Administered 2013-06-21: 1 mg via INTRAVENOUS
  Filled 2013-06-21: qty 1

## 2013-06-21 NOTE — Progress Notes (Signed)
Inpatient Diabetes Program Recommendations  AACE/ADA: New Consensus Statement on Inpatient Glycemic Control (2013)  Target Ranges:  Prepandial:   less than 140 mg/dL      Peak postprandial:   less than 180 mg/dL (1-2 hours)      Critically ill patients:  140 - 180 mg/dL   Results for BECKETT, HICKMON (MRN [id number]) as of 06/21/2013 08:00  Ref. Range 06/20/2013 19:15 06/21/2013 05:19  Glucose Latest Range: 70-99 mg/dL 629 (H) 528 (H)    Inpatient Diabetes Program Recommendations Correction (SSI): Please consider ordering CBGs ACHS with Novolog correction scale. Diet: Please consider adding Carb Modified to current Heart Healthy diet.  Note: Patient does not have a documented history of diabetes.  Initial lab glucose was 107 mg/dl on 9/8 @ 41:32.  Patient is ordered Solumedrol 125 mg Q6H which is contributing to hyperglycemia.  Fasting lab glucose 263 mg/dl this morning.  While inpatient and receiving steroids, please order CBGs ACHS with Novolog correction and may want to add Carb Modified to current diet order.  An A1C has been ordered and is pending.  Will continue to follow as an inpatient.  Thanks, Orlando Penner, RN, MSN, CCRN Diabetes Coordinator Inpatient Diabetes Program [phone]

## 2013-06-21 NOTE — Plan of Care (Signed)
Problem: Discharge Progression Outcomes Goal: Flu vaccine received if indicated Outcome: Adequate for Discharge 06/21/13 Flu vaccine to be received from pharmacy for patient to receive prior to discharge as requested.

## 2013-06-21 NOTE — Clinical Social Work Note (Signed)
CSW received referral for health insurance. Notified financial counselor for follow up. CSW signing off, but can be reconsulted if needed.  Derenda Fennel, [place] [phone]

## 2013-06-21 NOTE — Progress Notes (Signed)
06/21/13 1130 Patient c/o chronic knee and hip pain, arthritis history. States tylenol does not seem to help and "heat packs not hot enough". Nonproductive cough this morning, dyspnea when up to bedside commode with assist this morning. Notified Dr. Karilyn Cota of c/o pain and cough, pt requests Norco for pain if possible since has helped in the past. Stated he will see patient and address needs. Notified patient MD aware. Earnstine Regal, RN

## 2013-06-21 NOTE — Progress Notes (Signed)
06/21/13 1447 Patient received influenza vaccine as requested prior to discharge. Tolerated well, see MAR. Pt left floor in stable condition via wheelchair accompanied by Martie Lee, patient advocate. Discharged home. Earnstine Regal, RN

## 2013-06-21 NOTE — Progress Notes (Signed)
06/21/13 1326 Patient being discharged home. Reviewed discharge instructions with patient. Copy of instructions, medication list, prescriptions, f/u information given. Voucher for prescriptions obtained per case management, given to patient. Pt aware voucher will not cover xanax prescription. Pt verbalized understanding of instructions and when to call MD via teachback.  IV site d/c'd and within normal limits. States pain "a lot better" after morphine as ordered. Patient requested daughter, Carla [name] be notified of discharge order. Notified Cheri as requested. Pt in stable condition awaiting daughter arrival for discharge. Earnstine Regal, RN

## 2013-06-21 NOTE — Discharge Summary (Signed)
Physician Discharge Summary  Carla [name] ZOX:[id number] DOB: Jul 20, 1951 DOA: 06/20/2013  PCP: [name], MD  Admit date: 06/20/2013 Discharge date: 06/21/2013  Time spent: Greater than 30 minutes  Recommendations for Outpatient Follow-up:  1. Follow with primary care physician in one week.   Discharge Diagnoses:  1. COPD exacerbation, improved. 2. Anxiety.   Discharge Condition: Stable.  Diet recommendation: Regular.  Filed Weights   06/20/13 1823 06/20/13 2127 06/21/13 0529  Weight: 99.791 kg (220 lb) 112.2 kg (247 lb 5.7 oz) 113.5 kg (250 lb 3.6 oz)    History of present illness:  This 62 year old lady presented to the hospital with symptoms of dyspnea for 4 days. Please see initial history as outlined below: HPI:  Carla [name] is a 62 y.o. female. Obese Caucasian lady with a history of COPD, otherwise been using her daughter's albuterol inhaler but ran out to 2-1/2 days ago, presents with the above complaints.  She reports grandchildren and others in the house of been sick with upper respiratory symptoms but she got it in her chest and has been wheezing and coughing.  She does not have her own medications because she lost her job and somehow lost Medicaid with it.  In the emergency room she received nebulizations and improved somewhat but continues to wheeze and is admitted for further control  Hospital Course:  The patient has improved overnight with intravenous steroids. She was not put on antibiotics as there was no indication of any pneumonia. However she is coughing and family tells me that she the cough is sometimes productive of purulent sputum. She says she has quit smoking. She is very anxious. She has apparently no prescription health insurance in order for her to afford medications. We will see if we can help her with this upon discharge. She will also follow with her primary care physician to see perhaps if she can obtain long-term samples. Apparently she is going  to be eligible for Medicaid soon which should help her. She is now stable for discharge.  Procedures:  None. (i.e. Studies not automatically included, echos, thoracentesis, etc; not x-rays)  Consultations:  None.  Discharge Exam: Filed Vitals:   06/21/13 0818  BP:   Pulse: 71  Temp:   Resp: 17    General: She looks systemically well. She is not toxic or septic. There is no respiratory distress at rest. Cardiovascular: Heart sounds are present and normal without murmurs. Respiratory: Lung fields are clear. There is no wheezing whatsoever. There are no crackles. There is no peripheral or central cyanosis. She is alert and orientated.  Discharge Instructions     Medication List         albuterol 108 (90 BASE) MCG/ACT inhaler  Commonly known as:  PROVENTIL HFA;VENTOLIN HFA  Inhale 2 puffs into the lungs every 6 (six) hours as needed for wheezing.     ALPRAZolam 0.5 MG tablet  Commonly known as:  XANAX  Take 1 tablet (0.5 mg total) by mouth at bedtime as needed for sleep.     azithromycin 250 MG tablet  Commonly known as:  ZITHROMAX  Take 1 tablet (250 mg total) by mouth daily.     guaiFENesin-codeine 100-10 MG/5ML syrup  Take 5 mLs by mouth 3 (three) times daily as needed for cough.     predniSONE 20 MG tablet  Commonly known as:  DELTASONE  Take 2 tablets daily for 3 days, then 1 tablet daily for 3 days, then half tablet daily for 3  days, then STOP.       Allergies  Allergen Reactions  . Bee Venom Swelling  . Amitriptyline     Has nightmares when using  . Nsaids Nausea And Vomiting  . Toradol [Ketorolac Tromethamine] Nausea And Vomiting  . Tramadol Nausea And Vomiting       Follow-up Information   Follow up with [name], MD. Schedule an appointment as soon as possible for a visit in 1 week.   Specialty:  Family Medicine   Contact information:   [facility] [address] [place] [address] [phone]        The results of  significant diagnostics from this hospitalization (including imaging, microbiology, ancillary and laboratory) are listed below for reference.    Significant Diagnostic Studies: Dg Chest 1 View  06/20/2013   *RADIOLOGY REPORT*  Clinical Data: Shortness of breath  CHEST - 1 VIEW  Comparison: Chest CT angiogram Mar 08, 2012 and chest radiograph Mar 08, 2012  Findings: Lungs clear.  Heart is upper normal in size with normal pulmonary vascularity.  No adenopathy.  No bone lesions.  IMPRESSION: No edema or consolidation.   Original Report Authenticated By: Bretta Bang, M.D.    Microbiology:    Labs: Basic Metabolic Panel:  Recent Labs Lab 06/20/13 1915 06/21/13 0519  NA 140 140  K 3.9 4.0  CL 102 102  CO2 28 18*  GLUCOSE 107* 263*  BUN 11 10  CREATININE 0.57 0.46*  CALCIUM 9.4 9.5   Liver Function Tests:  Recent Labs Lab 06/21/13 0519  AST 20  ALT 23  ALKPHOS 127*  BILITOT 0.2*  PROT 7.5  ALBUMIN 3.6     CBC:  Recent Labs Lab 06/20/13 1915 06/21/13 0519  WBC 16.3* 14.5*  NEUTROABS 6.5  --   HGB 14.5 15.1*  HCT 44.9 46.6*  MCV 92.2 92.1  PLT 279 286        Signed:  GOSRANI,NIMISH C  [facility] 06/21/2013, 12:10 PM

## 2013-06-24 NOTE — Progress Notes (Signed)
UR Chart Review Completed  

## 2013-07-24 ENCOUNTER — Emergency Department (HOSPITAL_COMMUNITY)
Admission: EM | Admit: 2013-07-24 | Discharge: 2013-07-24 | Disposition: A | Discharge status: Home / Self Care | Payer: Medicare Other | Attending: Emergency Medicine | Admitting: Emergency Medicine

## 2013-07-24 ENCOUNTER — Encounter (HOSPITAL_COMMUNITY): Payer: Self-pay | Admitting: Emergency Medicine

## 2013-07-24 DIAGNOSIS — J449 Chronic obstructive pulmonary disease, unspecified: Secondary | ICD-10-CM | POA: Insufficient documentation

## 2013-07-24 DIAGNOSIS — I10 Essential (primary) hypertension: Secondary | ICD-10-CM | POA: Insufficient documentation

## 2013-07-24 DIAGNOSIS — Z8739 Personal history of other diseases of the musculoskeletal system and connective tissue: Secondary | ICD-10-CM | POA: Insufficient documentation

## 2013-07-24 DIAGNOSIS — Z79899 Other long term (current) drug therapy: Secondary | ICD-10-CM | POA: Insufficient documentation

## 2013-07-24 DIAGNOSIS — M25569 Pain in unspecified knee: Secondary | ICD-10-CM | POA: Insufficient documentation

## 2013-07-24 DIAGNOSIS — G8929 Other chronic pain: Secondary | ICD-10-CM | POA: Insufficient documentation

## 2013-07-24 DIAGNOSIS — J4489 Other specified chronic obstructive pulmonary disease: Secondary | ICD-10-CM | POA: Insufficient documentation

## 2013-07-24 DIAGNOSIS — Z87891 Personal history of nicotine dependence: Secondary | ICD-10-CM | POA: Insufficient documentation

## 2013-07-24 MED ORDER — NAPROXEN 250 MG PO TABS
500.0000 mg | ORAL_TABLET | Freq: Once | ORAL | Status: AC
  Administered 2013-07-24: 500 mg via ORAL
  Filled 2013-07-24: qty 2

## 2013-07-24 MED ORDER — NAPROXEN SODIUM 550 MG PO TABS: 550.0000 mg | ORAL_TABLET | Freq: Two times a day (BID) | ORAL | Status: DC

## 2013-07-24 MED ORDER — METHOCARBAMOL 500 MG PO TABS
1000.0000 mg | ORAL_TABLET | Freq: Once | ORAL | Status: AC
  Administered 2013-07-24: 1000 mg via ORAL
  Filled 2013-07-24: qty 2

## 2013-07-24 MED ORDER — ONDANSETRON HCL 4 MG PO TABS: 4.0000 mg | ORAL_TABLET | Freq: Three times a day (TID) | ORAL | Status: DC | PRN

## 2013-07-24 MED ORDER — ONDANSETRON 8 MG PO TBDP
8.0000 mg | ORAL_TABLET | Freq: Once | ORAL | Status: AC
  Administered 2013-07-24: 8 mg via ORAL
  Filled 2013-07-24: qty 1

## 2013-07-24 MED ORDER — NAPROXEN SODIUM 550 MG PO TABS: 550.0000 mg | ORAL_TABLET | Freq: Once | ORAL | Status: DC

## 2013-07-24 MED ORDER — METHOCARBAMOL 500 MG PO TABS: ORAL_TABLET | ORAL | Status: DC

## 2013-07-24 NOTE — ED Provider Notes (Signed)
CSN: [id number]     Arrival date & time 07/24/13  1256 History  This chart was scribed for Ward Givens, MD by Caryn Bee, ED Scribe. This patient was seen in room APA09/APA09 and the patient's care was started 1:30 PM.    Chief Complaint  Patient presents with  . Knee Pain    The history is provided by the patient. No language interpreter was used.   HPI Comments: Carla [name] is a 62 y.o. female with h/o chronic arthritis brought in by ambulance, who presents to the Emergency Department complaining of gradual onset moderate bilateral knee pain and swelling that began about two hours ago while she was giving her daughter CPR on the floor. She was previously prescribed muscle relaxers, but has been off of them for a while due to nto being to afford them. Pt uses walker and cane to ambulate at baseline. Pt does not smoke or use alcohol. Pt's daughter lives with her, along with her daughter's family. Pt is allergic to Toradol (causes nausea, vomiting and headache) Pt has pain in bilateral knees at baseline. Pt states that she has been told by Eulah Pont and Thurston Hole that she needs knee replacement surgeries. She reports she has tried no treatment for her knee pain today. She states her daughter abuses prescription pain meds and she thinks she overdosed today. SHe had her back injected 4 days ago and has already run out of her hydrocodone.  She was unable to awaken her daughter today and called EMS who told her to put her on the floor and do CPR. She states the patient had normal color, a pulse at her wrist but she couldn't see her breathing. She was starting CPR when EMS arrived and her daughter aroused with ammonia. She refused to be transported by EMS and EMS brought this patient instead.    Pt's PCP is Dr. [name]   Past Medical History  Diagnosis Date  . Arthritis   . Bronchitis   . Hypertension   . Chronic back pain   . Chronic knee pain   . COPD (chronic obstructive pulmonary disease)     Past Surgical History  Procedure Laterality Date  . Knee surgery    . Appendectomy    . Tonsillectomy     Family History  Problem Relation Age of Onset  . Heart failure Father   . Diabetes Father   . Kidney failure Father    History  Substance Use Topics  . Smoking status: Former Smoker -- 0.50 packs/day    Quit date: 02/26/2012  . Smokeless tobacco: Not on file  . Alcohol Use: No   Lives at home with daughter and her SIL and grandchildren Uses a cane/walker  OB History   Grav Para Term Preterm Abortions TAB SAB Ect Mult Living   2 2 2             Review of Systems  Musculoskeletal: Positive for arthralgias (Bilateral knees). Negative for joint swelling.  Skin: Negative for color change.  Neurological: Negative for numbness.  All other systems reviewed and are negative.    Allergies  Bee venom; Amitriptyline; Nsaids; Toradol; and Tramadol  Home Medications   Current Outpatient Rx  Name  Route  Sig  Dispense  Refill  . albuterol (PROVENTIL HFA;VENTOLIN HFA) 108 (90 BASE) MCG/ACT inhaler   Inhalation   Inhale 2 puffs into the lungs every 6 (six) hours as needed for wheezing.   1 Inhaler   2   .  ALPRAZolam (XANAX) 0.5 MG tablet   Oral   Take 1 tablet (0.5 mg total) by mouth at bedtime as needed for sleep.   30 tablet   0    BP 153/66  Pulse 72  Temp(Src) 98 F (36.7 C) (Oral)  Resp 18  Ht 5\' 4"  (1.626 m)  Wt 240 lb (108.863 kg)  BMI 41.18 kg/m2  SpO2 96%  Vital signs normal    Physical Exam  Nursing note and vitals reviewed. Constitutional: She is oriented to person, place, and time. She appears well-developed and well-nourished.  Non-toxic appearance. She does not appear ill. No distress.  HENT:  Head: Normocephalic and atraumatic.  Right Ear: External ear normal.  Left Ear: External ear normal.  Nose: Nose normal. No mucosal edema or rhinorrhea.  Mouth/Throat: Oropharynx is clear and moist and mucous membranes are normal. No dental  abscesses or uvula swelling.  Eyes: Conjunctivae and EOM are normal. Pupils are equal, round, and reactive to light.  Neck: Normal range of motion and full passive range of motion without pain. Neck supple.  Cardiovascular: Normal rate, regular rhythm and normal heart sounds.  Exam reveals no gallop and no friction rub.   No murmur heard. Pulmonary/Chest: Effort normal and breath sounds normal. No respiratory distress. She has no wheezes. She has no rhonchi. She has no rales. She exhibits no tenderness and no crepitus.  Abdominal: Soft. Normal appearance and bowel sounds are normal. She exhibits no distension. There is no tenderness. There is no rebound and no guarding.  Musculoskeletal: Normal range of motion. She exhibits tenderness. She exhibits no edema.  Moves all extremities well. No bruising, no abrasions, no swelling. No joint effusions. Knees are tender diffusely without localization.   Neurological: She is alert and oriented to person, place, and time. She has normal strength. No cranial nerve deficit.  Skin: Skin is warm, dry and intact. No rash noted. No erythema. No pallor.  Psychiatric: She has a normal mood and affect. Her speech is normal and behavior is normal. Her mood appears not anxious.    ED Course  Procedures (including critical care time)  Medications  methocarbamol (ROBAXIN) tablet 1,000 mg (1,000 mg Oral Given 07/24/13 1345)  ondansetron (ZOFRAN-ODT) disintegrating tablet 8 mg (8 mg Oral Given 07/24/13 1345)  naproxen (NAPROSYN) tablet 500 mg (500 mg Oral Given 07/24/13 1345)    DIAGNOSTIC STUDIES: Oxygen Saturation is 96% on room air, normal by my interpretation.    COORDINATION OF CARE: 1:36 PM-Discussed treatment plan which includes muscle relaxers with pt at bedside and pt agreed to plan.   Although pt only has had 3 ED visits in the past 6 months, since March 2013 she has had 10 ED visits, 3 for bronchitis/COPD and the rest for knee pain and back pain. She  states her own doctor gives her robaxin for her knee pain. Pt states "We lock up medications we don't want my daughter to get into".   PT does not know what an anti-inflammatory medication is, she thinks robaxin and flexeril are anti-inflammatories. Her "allergies" are first response "They don't work" then nausea.    MDM   1. Chronic knee pain, unspecified laterality    New Prescriptions   METHOCARBAMOL (ROBAXIN) 500 MG TABLET    Take 1 or 2 po QID for muscle spasm and pain   NAPROXEN SODIUM (ANAPROX DS) 550 MG TABLET    Take 1 tablet (550 mg total) by mouth 2 (two) times daily with a meal.  ONDANSETRON (ZOFRAN) 4 MG TABLET    Take 1 tablet (4 mg total) by mouth every 8 (eight) hours as needed for nausea.    Devoria Albe, MD, FACEP    I personally performed the services described in this documentation, which was scribed in my presence. The recorded information has been reviewed and considered.  Devoria Albe, MD, Armando Gang    Ward Givens, MD 07/24/13 2107

## 2013-07-24 NOTE — ED Notes (Signed)
Pt c/o bilateral knee pain after having to lift. Also right hip and lower back pain. All are chronic pains. Nad. No obvious deformities.

## 2013-10-13 DIAGNOSIS — R413 Other amnesia: Secondary | ICD-10-CM

## 2013-10-13 HISTORY — DX: Other amnesia: R41.3

## 2013-12-17 ENCOUNTER — Emergency Department (HOSPITAL_COMMUNITY)
Admission: EM | Admit: 2013-12-17 | Discharge: 2013-12-17 | Disposition: A | Discharge status: Home / Self Care | Payer: Medicare Other | Attending: Emergency Medicine | Admitting: Emergency Medicine

## 2013-12-17 ENCOUNTER — Encounter (HOSPITAL_COMMUNITY): Payer: Self-pay | Admitting: Emergency Medicine

## 2013-12-17 DIAGNOSIS — T5291XA Toxic effect of unspecified organic solvent, accidental (unintentional), initial encounter: Secondary | ICD-10-CM | POA: Insufficient documentation

## 2013-12-17 DIAGNOSIS — T7840XA Allergy, unspecified, initial encounter: Secondary | ICD-10-CM

## 2013-12-17 DIAGNOSIS — Y9389 Activity, other specified: Secondary | ICD-10-CM | POA: Insufficient documentation

## 2013-12-17 DIAGNOSIS — J449 Chronic obstructive pulmonary disease, unspecified: Secondary | ICD-10-CM | POA: Insufficient documentation

## 2013-12-17 DIAGNOSIS — I10 Essential (primary) hypertension: Secondary | ICD-10-CM | POA: Insufficient documentation

## 2013-12-17 DIAGNOSIS — J4489 Other specified chronic obstructive pulmonary disease: Secondary | ICD-10-CM | POA: Insufficient documentation

## 2013-12-17 DIAGNOSIS — Y92009 Unspecified place in unspecified non-institutional (private) residence as the place of occurrence of the external cause: Secondary | ICD-10-CM | POA: Insufficient documentation

## 2013-12-17 DIAGNOSIS — L299 Pruritus, unspecified: Secondary | ICD-10-CM | POA: Insufficient documentation

## 2013-12-17 DIAGNOSIS — R000 Tachycardia, unspecified: Secondary | ICD-10-CM | POA: Insufficient documentation

## 2013-12-17 DIAGNOSIS — Z87891 Personal history of nicotine dependence: Secondary | ICD-10-CM | POA: Insufficient documentation

## 2013-12-17 DIAGNOSIS — M129 Arthropathy, unspecified: Secondary | ICD-10-CM | POA: Insufficient documentation

## 2013-12-17 DIAGNOSIS — Z79899 Other long term (current) drug therapy: Secondary | ICD-10-CM | POA: Insufficient documentation

## 2013-12-17 DIAGNOSIS — G8929 Other chronic pain: Secondary | ICD-10-CM | POA: Insufficient documentation

## 2013-12-17 MED ORDER — FAMOTIDINE 20 MG PO TABS
20.0000 mg | ORAL_TABLET | Freq: Once | ORAL | Status: AC
  Administered 2013-12-17: 20 mg via ORAL
  Filled 2013-12-17: qty 1

## 2013-12-17 MED ORDER — PREDNISONE 20 MG PO TABS: ORAL_TABLET | ORAL | Status: DC

## 2013-12-17 MED ORDER — OXYCODONE-ACETAMINOPHEN 5-325 MG PO TABS
2.0000 | ORAL_TABLET | Freq: Once | ORAL | Status: AC
  Administered 2013-12-17: 2 via ORAL
  Filled 2013-12-17: qty 2

## 2013-12-17 MED ORDER — DIPHENHYDRAMINE HCL 25 MG PO TABS: 50.0000 mg | ORAL_TABLET | ORAL | Status: DC | PRN

## 2013-12-17 MED ORDER — PREDNISONE 50 MG PO TABS
60.0000 mg | ORAL_TABLET | Freq: Once | ORAL | Status: AC
  Administered 2013-12-17: 20:00:00 60 mg via ORAL
  Filled 2013-12-17 (×2): qty 1

## 2013-12-17 MED ORDER — FAMOTIDINE 20 MG PO TABS: 20.0000 mg | ORAL_TABLET | Freq: Two times a day (BID) | ORAL | Status: DC

## 2013-12-17 NOTE — ED Notes (Signed)
Patient requesting to know when she can go home and requesting something for headache.

## 2013-12-17 NOTE — ED Notes (Signed)
Pt was using a cleaner on the floor and started having a reaction to the chemical, felt that her throat was closing up. Pt was given 50 mg of benadryl and .3 epi both IM en route by ems.

## 2013-12-17 NOTE — ED Notes (Signed)
Discharge instructions given and reviewed with patient and family members.  Patient verbalized understanding to take medications as directed and to follow up with PMD as needed.  Patient discharged home in good condition.  Family members accompanied discharge.

## 2013-12-17 NOTE — ED Provider Notes (Signed)
CSN: [id number]     Arrival date & time 12/17/13  0375 History  This chart was scribed for [name], MD by Dorothey Baseman, ED Scribe. This patient was seen in room APA02/APA02 and the patient's care was started at 7:17 PM.    Chief Complaint  Patient presents with  . Allergic Reaction   The history is provided by the patient. No language interpreter was used.   HPI Comments: Carla [name] is a 63 y.o. female with a history of COPD (patient is not on oxygen at home) and bronchitis who presents to the Emergency Department complaining of an allergic reaction including diffuse itching (without rash), cough, wheezing, shortness of breath, choking, and gagging onset just PTA after she reports that she was exposed to CLR cleaning product in her house. Patient reports that she then went outside and used her inhaler, but without significant relief of her symptoms. Patient received 50 mg of Benadryl and intramuscular epinephrine en route via EMS. She denies fever, emesis, diarrhea, nausea. Patient has allergies to bee venom, amitriptyline, mushroom extract complex, NSAIDs, Toradol, and tramadol. Patient has a history of arthritis, HTN, chronic back and knee pain.   Past Medical History  Diagnosis Date  . Arthritis   . Bronchitis   . Hypertension   . Chronic back pain   . Chronic knee pain   . COPD (chronic obstructive pulmonary disease)    Past Surgical History  Procedure Laterality Date  . Knee surgery    . Appendectomy    . Tonsillectomy     Family History  Problem Relation Age of Onset  . Heart failure Father   . Diabetes Father   . Kidney failure Father    History  Substance Use Topics  . Smoking status: Former Smoker -- 0.50 packs/day    Quit date: 02/26/2012  . Smokeless tobacco: Not on file  . Alcohol Use: No   OB History   Grav Para Term Preterm Abortions TAB SAB Ect Mult Living   2 2 2             Review of Systems  A complete 10 system review of systems was obtained  and all systems are negative except as noted in the HPI and PMH.    Allergies  Bee venom; Amitriptyline; Mushroom extract complex; Toradol; and Tramadol  Home Medications   Current Outpatient Rx  Name  Route  Sig  Dispense  Refill  . albuterol (PROVENTIL HFA;VENTOLIN HFA) 108 (90 BASE) MCG/ACT inhaler   Inhalation   Inhale 2 puffs into the lungs every 6 (six) hours as needed for wheezing.   1 Inhaler   2   . cyclobenzaprine (FLEXERIL) 10 MG tablet   Oral   Take 10 mg by mouth 3 (three) times daily as needed for muscle spasms.         . diazepam (VALIUM) 5 MG tablet   Oral   Take 5 mg by mouth 3 (three) times daily as needed for anxiety.         Marland Kitchen HYDROcodone-acetaminophen (NORCO) 10-325 MG per tablet   Oral   Take 1 tablet by mouth every 6 (six) hours as needed for moderate pain or severe pain.         . diphenhydrAMINE (BENADRYL) 25 MG tablet   Oral   Take 2 tablets (50 mg total) by mouth every 4 (four) hours as needed for itching.   20 tablet   0   . famotidine (PEPCID) 20  MG tablet   Oral   Take 1 tablet (20 mg total) by mouth 2 (two) times daily.   10 tablet   0   . predniSONE (DELTASONE) 20 MG tablet      2 tabs po daily x 3 days   6 tablet   0    Triage Vitals: BP 102/61  Pulse 97  Temp(Src) 97.9 F (36.6 C)  Ht 5\' 4"  (1.626 m)  Wt 240 lb (108.863 kg)  BMI 41.18 kg/m2  SpO2 100%  Physical Exam  Nursing note and vitals reviewed. Constitutional:  Awake, alert, nontoxic appearance.  HENT:  Head: Atraumatic.  Mouth/Throat: Oropharynx is clear and moist.  No drooling, stridor, tongue swelling, or compromised airway.   Eyes: Right eye exhibits no discharge. Left eye exhibits no discharge.  Neck: Neck supple.  Cardiovascular: Regular rhythm and normal heart sounds.  Tachycardia present.   No murmur heard. Mild tachycardia.   Pulmonary/Chest: Effort normal and breath sounds normal. No stridor. She has no wheezes. She exhibits no tenderness.   Abdominal: Soft. There is no tenderness. There is no rebound.  Musculoskeletal: She exhibits no edema and no tenderness.  Baseline ROM, no obvious new focal weakness.  Neurological:  Mental status and motor strength appears baseline for patient and situation.  Skin: No rash noted.  Psychiatric: She has a normal mood and affect.    ED Course  Procedures (including critical care time) DIAGNOSTIC STUDIES: Oxygen Saturation is 100% on room air, normal by my interpretation.    COORDINATION OF CARE: 7:25 PM- Will order prednisone and Pepcid to manage symptoms. Discussed treatment plan with patient at bedside and patient verbalized agreement.   9:25 PM- Patient told RN that she currently has a headache and wants to go home.  Patient is a gradual onset headache without focal neurologic type symptoms otherwise feels much better and wants to go home.2140 Patient / Family / Caregiver informed of clinical course, understand medical decision-making process, and agree with plan. Labs Review Labs Reviewed - No data to display Imaging Review No results found.   EKG Interpretation None      MDM   Final diagnoses:  Allergic reaction    I doubt any other EMC precluding discharge at this time including, but not necessarily limited to the following:anaphylaxis.  I personally performed the services described in this documentation, which was scribed in my presence. The recorded information has been reviewed and is accurate.     Hurman HornJohn M Evonna Stoltz, MD 12/18/13 1452

## 2013-12-17 NOTE — ED Notes (Signed)
Patient c/o headache; requested all lights be turned out.

## 2014-08-14 ENCOUNTER — Encounter (HOSPITAL_COMMUNITY): Payer: Self-pay | Admitting: Emergency Medicine

## 2014-09-03 ENCOUNTER — Emergency Department (HOSPITAL_COMMUNITY): Payer: Medicare Other

## 2014-09-03 ENCOUNTER — Emergency Department (HOSPITAL_COMMUNITY)
Admission: EM | Admit: 2014-09-03 | Discharge: 2014-09-03 | Disposition: A | Discharge status: Home / Self Care | Payer: Medicare Other | Attending: Emergency Medicine | Admitting: Emergency Medicine

## 2014-09-03 ENCOUNTER — Encounter (HOSPITAL_COMMUNITY): Payer: Self-pay | Admitting: *Deleted

## 2014-09-03 DIAGNOSIS — M779 Enthesopathy, unspecified: Secondary | ICD-10-CM | POA: Insufficient documentation

## 2014-09-03 DIAGNOSIS — Y92009 Unspecified place in unspecified non-institutional (private) residence as the place of occurrence of the external cause: Secondary | ICD-10-CM | POA: Insufficient documentation

## 2014-09-03 DIAGNOSIS — W1839XA Other fall on same level, initial encounter: Secondary | ICD-10-CM | POA: Insufficient documentation

## 2014-09-03 DIAGNOSIS — Z79899 Other long term (current) drug therapy: Secondary | ICD-10-CM | POA: Insufficient documentation

## 2014-09-03 DIAGNOSIS — W19XXXA Unspecified fall, initial encounter: Secondary | ICD-10-CM

## 2014-09-03 DIAGNOSIS — I10 Essential (primary) hypertension: Secondary | ICD-10-CM | POA: Diagnosis not present

## 2014-09-03 DIAGNOSIS — Z87891 Personal history of nicotine dependence: Secondary | ICD-10-CM | POA: Diagnosis not present

## 2014-09-03 DIAGNOSIS — J449 Chronic obstructive pulmonary disease, unspecified: Secondary | ICD-10-CM | POA: Insufficient documentation

## 2014-09-03 DIAGNOSIS — G8929 Other chronic pain: Secondary | ICD-10-CM | POA: Insufficient documentation

## 2014-09-03 DIAGNOSIS — S79912A Unspecified injury of left hip, initial encounter: Secondary | ICD-10-CM | POA: Insufficient documentation

## 2014-09-03 DIAGNOSIS — M67952 Unspecified disorder of synovium and tendon, left thigh: Secondary | ICD-10-CM

## 2014-09-03 DIAGNOSIS — Y998 Other external cause status: Secondary | ICD-10-CM | POA: Diagnosis not present

## 2014-09-03 DIAGNOSIS — M1990 Unspecified osteoarthritis, unspecified site: Secondary | ICD-10-CM | POA: Diagnosis not present

## 2014-09-03 DIAGNOSIS — Y9301 Activity, walking, marching and hiking: Secondary | ICD-10-CM | POA: Insufficient documentation

## 2014-09-03 MED ORDER — OXYCODONE-ACETAMINOPHEN 5-325 MG PO TABS: 1.0000 | ORAL_TABLET | Freq: Four times a day (QID) | ORAL | Status: DC | PRN

## 2014-09-03 MED ORDER — HYDROMORPHONE HCL 2 MG/ML IJ SOLN
2.0000 mg | Freq: Once | INTRAMUSCULAR | Status: AC
  Administered 2014-09-03: 2 mg via INTRAMUSCULAR
  Filled 2014-09-03: qty 1

## 2014-09-03 MED ORDER — ONDANSETRON HCL 4 MG/2ML IJ SOLN
4.0000 mg | Freq: Once | INTRAMUSCULAR | Status: AC
  Administered 2014-09-03: 4 mg via INTRAVENOUS
  Filled 2014-09-03: qty 2

## 2014-09-03 NOTE — ED Notes (Signed)
Pt was brought in from home by [facility]. Pt was walking with her cane and fell on her left hip. Pt complains of left hip pain. Pt denies hitting head. Pt is not on blood thinners.

## 2014-09-03 NOTE — Discharge Instructions (Signed)
Rest. Ice for 20 minutes every 2 hours while awake for the next 2 days.  Percocet as prescribed as needed for pain.  Follow-up with your primary Dr. in the next week, and return to the ER if your symptoms substantially worsen or change.   Hip Pain Your hip is the joint between your upper legs and your lower pelvis. The bones, cartilage, tendons, and muscles of your hip joint perform a lot of work each day supporting your body weight and allowing you to move around. Hip pain can range from a minor ache to severe pain in one or both of your hips. Pain may be felt on the inside of the hip joint near the groin, or the outside near the buttocks and upper thigh. You may have swelling or stiffness as well.  HOME CARE INSTRUCTIONS   Take medicines only as directed by your health care provider.  Apply ice to the injured area:  Put ice in a plastic bag.  Place a towel between your skin and the bag.  Leave the ice on for 15-20 minutes at a time, 3-4 times a day.  Keep your leg raised (elevated) when possible to lessen swelling.  Avoid activities that cause pain.  Follow specific exercises as directed by your health care provider.  Sleep with a pillow between your legs on your most comfortable side.  Record how often you have hip pain, the location of the pain, and what it feels like. SEEK MEDICAL CARE IF:   You are unable to put weight on your leg.  Your hip is red or swollen or very tender to touch.  Your pain or swelling continues or worsens after 1 week.  You have increasing difficulty walking.  You have a fever. SEEK IMMEDIATE MEDICAL CARE IF:   You have fallen.  You have a sudden increase in pain and swelling in your hip. MAKE SURE YOU:   Understand these instructions.  Will watch your condition.  Will get help right away if you are not doing well or get worse. Document Released: 03/19/2010 Document Revised: 02/13/2014 Document Reviewed: 05/26/2013 [facility] [facility] [employer] Patient  Information 2015 [place], [place]. This information is not intended to replace advice given to you by your health care provider. Make sure you discuss any questions you have with your health care provider.

## 2014-09-03 NOTE — ED Provider Notes (Signed)
CSN: [id number]     Arrival date & time 09/03/14  1154 History   First MD Initiated Contact with Patient 09/03/14 1310    This chart was scribed for Geoffery Lyonsouglas Shrinika Blatz, MD by Marica OtterNusrat Rahman, ED Scribe. This patient was seen in room APA07/APA07 and the patient's care was started at 1:12 PM.  Chief Complaint  Patient presents with  . Fall   The history is provided by the patient and a relative. No language interpreter was used.    PCP: Ernestine ConradBLUTH, KIRK, MD HPI Comments: Carla [name] is a 63 y.o. female, with medical Hx noted below and significant for chronic knee pain, knee surgery, who presents to the Emergency Department complaining of fell earlier today when she was walking with her cane and landed on her left hip. Pt complains of associated increased left hip pain--pt notes that she has hip pain at baseline. Pt denies head trauma, LOC, or any additional Sx. Pt reports being on blood thinners.    Past Medical History  Diagnosis Date  . Arthritis   . Bronchitis   . Hypertension   . Chronic back pain   . Chronic knee pain   . COPD (chronic obstructive pulmonary disease)    Past Surgical History  Procedure Laterality Date  . Knee surgery    . Appendectomy    . Tonsillectomy     Family History  Problem Relation Age of Onset  . Heart failure Father   . Diabetes Father   . Kidney failure Father    History  Substance Use Topics  . Smoking status: Former Smoker -- 0.50 packs/day    Quit date: 02/26/2012  . Smokeless tobacco: Not on file  . Alcohol Use: No   OB History    Gravida Para Term Preterm AB TAB SAB Ectopic Multiple Living   2 2 2             Review of Systems A complete 10 system review of systems was obtained and all systems are negative except as noted in the HPI and PMH.     Allergies  Bee venom; Amitriptyline; Mushroom extract complex; Toradol; and Tramadol  Home Medications   Prior to Admission medications   Medication Sig Start Date End Date Taking?  Authorizing Provider  albuterol (PROVENTIL HFA;VENTOLIN HFA) 108 (90 BASE) MCG/ACT inhaler Inhale 2 puffs into the lungs every 6 (six) hours as needed for wheezing. 06/21/13   [name], MD  cyclobenzaprine (FLEXERIL) 10 MG tablet Take 10 mg by mouth 3 (three) times daily as needed for muscle spasms.    Historical Provider, MD  diazepam (VALIUM) 5 MG tablet Take 5 mg by mouth 3 (three) times daily as needed for anxiety.    Historical Provider, MD  diphenhydrAMINE (BENADRYL) 25 MG tablet Take 2 tablets (50 mg total) by mouth every 4 (four) hours as needed for itching. 12/17/13   Hurman HornJohn M Bednar, MD  famotidine (PEPCID) 20 MG tablet Take 1 tablet (20 mg total) by mouth 2 (two) times daily. 12/17/13   Hurman HornJohn M Bednar, MD  HYDROcodone-acetaminophen (NORCO) 10-325 MG per tablet Take 1 tablet by mouth every 6 (six) hours as needed for moderate pain or severe pain.    Historical Provider, MD  predniSONE (DELTASONE) 20 MG tablet 2 tabs po daily x 3 days 12/17/13   Hurman HornJohn M Bednar, MD   Triage Vitals: BP 206/95 mmHg  Pulse 73  Temp(Src) 98.4 F (36.9 C) (Oral)  Resp 22  Ht 5\' 4"  (1.626  m)  Wt 220 lb (99.791 kg)  BMI 37.74 kg/m2  SpO2 98% Physical Exam  Constitutional: She is oriented to person, place, and time. She appears well-developed and well-nourished.  Pt appears emotionally distraught and tearful.   HENT:  Head: Normocephalic and atraumatic.  Eyes: Conjunctivae and EOM are normal.  Neck: Neck supple. No tracheal deviation present.  Cardiovascular: Normal rate.   Pulmonary/Chest: Effort normal. No respiratory distress.  Musculoskeletal: Normal range of motion. She exhibits tenderness.  Left hip appears grossly normal. There is no shortening or rotation. DP pulses easily palpable. Motor and sensory intact to the L foot.   Neurological: She is alert and oriented to person, place, and time.  Skin: Skin is warm and dry.  Nursing note and vitals reviewed.   ED Course  Procedures (including  critical care time) DIAGNOSTIC STUDIES: Oxygen Saturation is 98% on RA, nl by my interpretation.    COORDINATION OF CARE: 1:17 PM-Discussed treatment plan which includes pain meds with pt at bedside and pt agreed to plan.   Labs Review Labs Reviewed - No data to display  Imaging Review No results found.   EKG Interpretation None      MDM   Final diagnoses:  Fall    Patient presents here after a fall complaining of left hip pain. Initial x-rays could not exclude a fracture. She then went for an MRI which reveals no fracture, however does show a partial tear of the gluteus muscle and tendon. She will be treated with pain medication and follow-up with her primary doctor.  I personally performed the services described in this documentation, which was scribed in my presence. The recorded information has been reviewed and is accurate.      [name], MD 09/03/14 1539

## 2014-09-13 ENCOUNTER — Emergency Department (HOSPITAL_COMMUNITY)
Admission: EM | Admit: 2014-09-13 | Discharge: 2014-09-13 | Disposition: A | Discharge status: Home / Self Care | Payer: Medicare Other | Attending: Emergency Medicine | Admitting: Emergency Medicine

## 2014-09-13 ENCOUNTER — Emergency Department (HOSPITAL_COMMUNITY): Payer: Medicare Other

## 2014-09-13 ENCOUNTER — Encounter (HOSPITAL_COMMUNITY): Payer: Self-pay

## 2014-09-13 DIAGNOSIS — Z7952 Long term (current) use of systemic steroids: Secondary | ICD-10-CM | POA: Diagnosis not present

## 2014-09-13 DIAGNOSIS — J449 Chronic obstructive pulmonary disease, unspecified: Secondary | ICD-10-CM | POA: Diagnosis not present

## 2014-09-13 DIAGNOSIS — I10 Essential (primary) hypertension: Secondary | ICD-10-CM | POA: Insufficient documentation

## 2014-09-13 DIAGNOSIS — Z79899 Other long term (current) drug therapy: Secondary | ICD-10-CM | POA: Insufficient documentation

## 2014-09-13 DIAGNOSIS — M1990 Unspecified osteoarthritis, unspecified site: Secondary | ICD-10-CM | POA: Diagnosis not present

## 2014-09-13 DIAGNOSIS — F0391 Unspecified dementia with behavioral disturbance: Secondary | ICD-10-CM | POA: Insufficient documentation

## 2014-09-13 DIAGNOSIS — G8929 Other chronic pain: Secondary | ICD-10-CM | POA: Insufficient documentation

## 2014-09-13 DIAGNOSIS — R4182 Altered mental status, unspecified: Secondary | ICD-10-CM | POA: Diagnosis present

## 2014-09-13 DIAGNOSIS — Z87891 Personal history of nicotine dependence: Secondary | ICD-10-CM | POA: Insufficient documentation

## 2014-09-13 LAB — CBC WITH DIFFERENTIAL/PLATELET
BASOS ABS: 0.1 10*3/uL (ref 0.0–0.1)
BASOS PCT: 1 % (ref 0–1)
EOS ABS: 0.3 10*3/uL (ref 0.0–0.7)
Eosinophils Relative: 3 % (ref 0–5)
HCT: 52 % — ABNORMAL HIGH (ref 36.0–46.0)
HEMOGLOBIN: 17.2 g/dL — AB (ref 12.0–15.0)
Lymphocytes Relative: 49 % — ABNORMAL HIGH (ref 12–46)
Lymphs Abs: 5.1 10*3/uL — ABNORMAL HIGH (ref 0.7–4.0)
MCH: 30.6 pg (ref 26.0–34.0)
MCHC: 33.1 g/dL (ref 30.0–36.0)
MCV: 92.4 fL (ref 78.0–100.0)
MONOS PCT: 9 % (ref 3–12)
Monocytes Absolute: 0.9 10*3/uL (ref 0.1–1.0)
NEUTROS ABS: 3.9 10*3/uL (ref 1.7–7.7)
NEUTROS PCT: 38 % — AB (ref 43–77)
PLATELETS: 183 10*3/uL (ref 150–400)
RBC: 5.63 MIL/uL — ABNORMAL HIGH (ref 3.87–5.11)
RDW: 13.7 % (ref 11.5–15.5)
WBC: 10.2 10*3/uL (ref 4.0–10.5)

## 2014-09-13 LAB — ETHANOL

## 2014-09-13 LAB — COMPREHENSIVE METABOLIC PANEL
ALK PHOS: 127 U/L — AB (ref 39–117)
ALT: 29 U/L (ref 0–35)
ANION GAP: 14 (ref 5–15)
AST: 28 U/L (ref 0–37)
Albumin: 3.6 g/dL (ref 3.5–5.2)
BILIRUBIN TOTAL: 0.4 mg/dL (ref 0.3–1.2)
BUN: 9 mg/dL (ref 6–23)
CO2: 25 meq/L (ref 19–32)
Calcium: 9.3 mg/dL (ref 8.4–10.5)
Chloride: 99 mEq/L (ref 96–112)
Creatinine, Ser: 0.55 mg/dL (ref 0.50–1.10)
GLUCOSE: 106 mg/dL — AB (ref 70–99)
POTASSIUM: 4.2 meq/L (ref 3.7–5.3)
SODIUM: 138 meq/L (ref 137–147)
Total Protein: 7.2 g/dL (ref 6.0–8.3)

## 2014-09-13 LAB — URINE MICROSCOPIC-ADD ON

## 2014-09-13 LAB — URINALYSIS, ROUTINE W REFLEX MICROSCOPIC
Bilirubin Urine: NEGATIVE
Glucose, UA: NEGATIVE mg/dL
Ketones, ur: NEGATIVE mg/dL
Leukocytes, UA: NEGATIVE
NITRITE: NEGATIVE
PROTEIN: NEGATIVE mg/dL
Specific Gravity, Urine: 1.01 (ref 1.005–1.030)
UROBILINOGEN UA: 0.2 mg/dL (ref 0.0–1.0)
pH: 5 (ref 5.0–8.0)

## 2014-09-13 LAB — RAPID URINE DRUG SCREEN, HOSP PERFORMED
AMPHETAMINES: NOT DETECTED
Barbiturates: NOT DETECTED
Benzodiazepines: NOT DETECTED
Cocaine: NOT DETECTED
Opiates: NOT DETECTED
TETRAHYDROCANNABINOL: NOT DETECTED

## 2014-09-13 NOTE — ED Notes (Signed)
Patient expresses concern that her daughter sent all of her medications via EMS except: Hydrocodone, Valium and Flexeril. Patient states her daughter "is not suppose to have these in her possession". I told patient that I could call [place] PD and rely her concern, patient turned down that offer at this time. States that her daughter is on the way to the hospital and she will wait to see if she brings them with her at that time.

## 2014-09-13 NOTE — ED Provider Notes (Signed)
CSN: [id number]     Arrival date & time 09/13/14  1358 History  This chart was scribed for Carla HutchingBrian Catalia Massett, MD by Annye AsaAnna [name], ED Scribe. This patient was seen in room APA12/APA12 and the patient's care was started at 2:16 PM.    Chief Complaint  Patient presents with  . Altered Mental Status   The history is provided by the patient. No language interpreter was used.     HPI Comments: Level V caveat for dementia. Carla ManSherry M [name] is a 63 y.o. female who presents to the Emergency Department complaining of altered mental status. Per nurse, patient reports that she went to see Dr. Loney HeringBluth today; she took a new seizure medication ("memantine") and began to feel dizzy.   Further history obtained from daughter: Patient was seen by her primary care doctor today in [place]. He recommended discontinuation of Aricept.  New medication Memantine 5 mg twice a day started today. Patient was able to walk independently today approximately 100 yards without stumbling. Daughter states this is good for her. When she got home from the doctor visit, she was seen in the table and apparently leaned over and became weak. Daughter states states that she might have "overdone it" today.  No specific complaints of chest pain, dyspnea, fever, chills, dysuria.  She reports back and hip pain   Past Medical History  Diagnosis Date  . Arthritis   . Bronchitis   . Hypertension   . Chronic back pain   . Chronic knee pain   . COPD (chronic obstructive pulmonary disease)    Past Surgical History  Procedure Laterality Date  . Knee surgery    . Appendectomy    . Tonsillectomy     Family History  Problem Relation Age of Onset  . Heart failure Father   . Diabetes Father   . Kidney failure Father    History  Substance Use Topics  . Smoking status: Former Smoker -- 0.50 packs/day    Quit date: 02/26/2012  . Smokeless tobacco: Not on file  . Alcohol Use: No   OB History    Gravida Para Term Preterm AB TAB SAB Ectopic Multiple  Living   2 2 2             Review of Systems  Unable to perform ROS: Dementia    A complete 10 system review of systems was obtained and all systems are negative except as noted in the HPI and PMH.    Allergies  Bee venom; Amitriptyline; Mushroom extract complex; Toradol; and Tramadol  Home Medications   Prior to Admission medications   Medication Sig Start Date End Date Taking? Authorizing Provider  albuterol (PROVENTIL HFA;VENTOLIN HFA) 108 (90 BASE) MCG/ACT inhaler Inhale 2 puffs into the lungs every 6 (six) hours as needed for wheezing. 06/21/13  Yes [name], MD  cyclobenzaprine (FLEXERIL) 10 MG tablet Take 10 mg by mouth 3 (three) times daily as needed for muscle spasms.   Yes Historical Provider, MD  diazepam (VALIUM) 5 MG tablet Take 5 mg by mouth 3 (three) times daily as needed for anxiety.   Yes Historical Provider, MD  diphenhydrAMINE (BENADRYL) 25 MG tablet Take 2 tablets (50 mg total) by mouth every 4 (four) hours as needed for itching. 12/17/13  Yes Hurman HornJohn M Bednar, MD  donepezil (ARICEPT) 10 MG tablet Take 10 mg by mouth at bedtime.   Yes Historical Provider, MD  gabapentin (NEURONTIN) 300 MG capsule Take 300 mg by mouth 3 (three) times  daily.   Yes Historical Provider, MD  HYDROcodone-acetaminophen (NORCO) 10-325 MG per tablet Take 1 tablet by mouth every 6 (six) hours as needed for moderate pain or severe pain.   Yes Historical Provider, MD  memantine (NAMENDA) 5 MG tablet Take 1 tablet by mouth 2 (two) times daily. 09/13/14  Yes Historical Provider, MD  famotidine (PEPCID) 20 MG tablet Take 1 tablet (20 mg total) by mouth 2 (two) times daily. Patient not taking: Reported on 09/03/2014 12/17/13   [name], MD  oxyCODONE-acetaminophen (PERCOCET) 5-325 MG per tablet Take 1-2 tablets by mouth every 6 (six) hours as needed. Patient not taking: Reported on 09/13/2014 09/03/14   [name], MD  predniSONE (DELTASONE) 20 MG tablet 2 tabs po daily x 3 days Patient not  taking: Reported on 09/03/2014 12/17/13   [name], MD   BP 116/70 mmHg  Pulse 68  Temp(Src) 97.7 F (36.5 C) (Oral)  Resp 18  Ht 5\' 4"  (1.626 m)  Wt 230 lb (104.327 kg)  BMI 39.46 kg/m2 Physical Exam  Constitutional: She is oriented to person, place, and time. She appears well-developed and well-nourished.  Answers questions in soft mumble  HENT:  Head: Normocephalic and atraumatic.  Eyes: Conjunctivae and EOM are normal. Pupils are equal, round, and reactive to light.  Neck: Normal range of motion. Neck supple.  Cardiovascular: Normal rate, regular rhythm and normal heart sounds.   Pulmonary/Chest: Effort normal and breath sounds normal.  Abdominal: Soft. Bowel sounds are normal.  Musculoskeletal: Normal range of motion.  Appears to move all extremities normally  Neurological: She is alert and oriented to person, place, and time.  Skin: Skin is warm and dry.  Psychiatric: She has a normal mood and affect. Her behavior is normal.  Nursing note and vitals reviewed.   ED Course  Procedures   DIAGNOSTIC STUDIES: Oxygen Saturation is 95% on RA, adequate by my interpretation.    COORDINATION OF CARE: 2:18 PM Discussed treatment plan with pt at bedside and pt agreed to plan.   Labs Review Labs Reviewed  COMPREHENSIVE METABOLIC PANEL - Abnormal; Notable for the following:    Glucose, Bld 106 (*)    Alkaline Phosphatase 127 (*)    All other components within normal limits  CBC WITH DIFFERENTIAL - Abnormal; Notable for the following:    RBC 5.63 (*)    Hemoglobin 17.2 (*)    HCT 52.0 (*)    Neutrophils Relative % 38 (*)    Lymphocytes Relative 49 (*)    Lymphs Abs 5.1 (*)    All other components within normal limits  URINALYSIS, ROUTINE W REFLEX MICROSCOPIC - Abnormal; Notable for the following:    Hgb urine dipstick TRACE (*)    All other components within normal limits  URINE MICROSCOPIC-ADD ON - Abnormal; Notable for the following:    Squamous Epithelial / LPF  FEW (*)    All other components within normal limits  ETHANOL  URINE RAPID DRUG SCREEN (HOSP PERFORMED)    Imaging Review Ct Head Wo Contrast  09/13/2014   CLINICAL DATA:  Altered mental status, numbness, dizziness post changing seizure medication  EXAM: CT HEAD WITHOUT CONTRAST  TECHNIQUE: Contiguous axial images were obtained from the base of the skull through the vertex without intravenous contrast.  COMPARISON:  10/06/2012  FINDINGS: No skull fracture is noted. Paranasal sinuses and mastoid air cells are unremarkable. No intracranial hemorrhage, mass effect or midline shift.  No acute infarction. No mass lesion is  noted on this unenhanced scan. The gray and white-matter differentiation is preserved. No intra or extra-axial fluid collection.  IMPRESSION: No acute intracranial abnormality.  No significant change.   Electronically Signed   By: Natasha Mead M.D.   On: 09/13/2014 15:13     EKG Interpretation   Date/Time:  Wednesday September 13 2014 14:08:37 EST Ventricular Rate:  74 PR Interval:  140 QRS Duration: 81 QT Interval:  425 QTC Calculation: 471 R Axis:   50 Text Interpretation:  Sinus rhythm Borderline T wave abnormalities  Confirmed by Adriana Simas  MD, Audrielle Vankuren ([id number]) on 09/13/2014 2:49:48 PM      MDM   Final diagnoses:  Altered mental state  Dementia, with behavioral disturbance    Daughter reports patient is behaving at baseline. CT head negative. Labs and urinalysis normal. EKG normal. Discussed findings with patient and her family members. They will follow-up with her primary care doctor.        I personally performed the services described in this documentation, which was scribed in my presence. The recorded information has been reviewed and considered.      Carla Hutching, MD 09/13/14 [id number]

## 2014-09-13 NOTE — Discharge Instructions (Signed)
Tests were normal as follows: CBC, chemistry panel, urinalysis, alcohol level, drug screen, CT head.   Follow-up your primary care doctor.

## 2014-09-13 NOTE — ED Notes (Signed)
Patient started new medication today "Memantine", shortly after taking patient became dizziness. Upon EMS arrival patient in floor (was instructed to lay in floor by 911 operator), patient AMS, did not respond to any stimuli including deep pain and ammonia tablet. In route patient began following commands. CBG 113, BP 140/110, 97% on RA upon EMS arrival. Currently patient awake without stimulation, following commands, VSS.

## 2014-11-10 ENCOUNTER — Emergency Department (HOSPITAL_COMMUNITY)
Admission: EM | Admit: 2014-11-10 | Discharge: 2014-11-10 | Disposition: A | Discharge status: Home / Self Care | Payer: Medicare Other | Attending: Emergency Medicine | Admitting: Emergency Medicine

## 2014-11-10 ENCOUNTER — Encounter (HOSPITAL_COMMUNITY): Payer: Self-pay | Admitting: *Deleted

## 2014-11-10 DIAGNOSIS — R52 Pain, unspecified: Secondary | ICD-10-CM | POA: Diagnosis present

## 2014-11-10 DIAGNOSIS — Z7952 Long term (current) use of systemic steroids: Secondary | ICD-10-CM | POA: Diagnosis not present

## 2014-11-10 DIAGNOSIS — G8929 Other chronic pain: Secondary | ICD-10-CM | POA: Diagnosis not present

## 2014-11-10 DIAGNOSIS — Z87891 Personal history of nicotine dependence: Secondary | ICD-10-CM | POA: Diagnosis not present

## 2014-11-10 DIAGNOSIS — M1990 Unspecified osteoarthritis, unspecified site: Secondary | ICD-10-CM | POA: Diagnosis not present

## 2014-11-10 DIAGNOSIS — M549 Dorsalgia, unspecified: Secondary | ICD-10-CM | POA: Diagnosis not present

## 2014-11-10 DIAGNOSIS — I10 Essential (primary) hypertension: Secondary | ICD-10-CM | POA: Diagnosis not present

## 2014-11-10 DIAGNOSIS — H538 Other visual disturbances: Secondary | ICD-10-CM | POA: Diagnosis not present

## 2014-11-10 DIAGNOSIS — R197 Diarrhea, unspecified: Secondary | ICD-10-CM | POA: Insufficient documentation

## 2014-11-10 DIAGNOSIS — R109 Unspecified abdominal pain: Secondary | ICD-10-CM | POA: Insufficient documentation

## 2014-11-10 DIAGNOSIS — R51 Headache: Secondary | ICD-10-CM | POA: Insufficient documentation

## 2014-11-10 DIAGNOSIS — M791 Myalgia, unspecified site: Secondary | ICD-10-CM

## 2014-11-10 DIAGNOSIS — J441 Chronic obstructive pulmonary disease with (acute) exacerbation: Secondary | ICD-10-CM | POA: Insufficient documentation

## 2014-11-10 DIAGNOSIS — H53149 Visual discomfort, unspecified: Secondary | ICD-10-CM | POA: Insufficient documentation

## 2014-11-10 DIAGNOSIS — R42 Dizziness and giddiness: Secondary | ICD-10-CM | POA: Diagnosis not present

## 2014-11-10 DIAGNOSIS — Z79899 Other long term (current) drug therapy: Secondary | ICD-10-CM | POA: Insufficient documentation

## 2014-11-10 LAB — BASIC METABOLIC PANEL
Anion gap: 7 (ref 5–15)
BUN: 11 mg/dL (ref 6–23)
CALCIUM: 9.9 mg/dL (ref 8.4–10.5)
CO2: 26 mmol/L (ref 19–32)
CREATININE: 0.58 mg/dL (ref 0.50–1.10)
Chloride: 103 mmol/L (ref 96–112)
GFR calc non Af Amer: >90 mL/min (ref >90)
GLUCOSE: 110 mg/dL — AB (ref 70–99)
POTASSIUM: 3.8 mmol/L (ref 3.5–5.1)
SODIUM: 136 mmol/L (ref 135–145)

## 2014-11-10 LAB — CBC WITH DIFFERENTIAL/PLATELET
Basophils Absolute: 0.1 10*3/uL (ref 0.0–0.1)
Basophils Relative: 1 % (ref 0–1)
EOS ABS: 0.3 10*3/uL (ref 0.0–0.7)
Eosinophils Relative: 3 % (ref 0–5)
HEMATOCRIT: 51.3 % — AB (ref 36.0–46.0)
Hemoglobin: 17 g/dL — ABNORMAL HIGH (ref 12.0–15.0)
LYMPHS ABS: 5.2 10*3/uL — AB (ref 0.7–4.0)
Lymphocytes Relative: 43 % (ref 12–46)
MCH: 30.5 pg (ref 26.0–34.0)
MCHC: 33.1 g/dL (ref 30.0–36.0)
MCV: 92.1 fL (ref 78.0–100.0)
Monocytes Absolute: 1 10*3/uL (ref 0.1–1.0)
Monocytes Relative: 8 % (ref 3–12)
NEUTROS ABS: 5.5 10*3/uL (ref 1.7–7.7)
Neutrophils Relative %: 45 % (ref 43–77)
Platelets: 248 10*3/uL (ref 150–400)
RBC: 5.57 MIL/uL — ABNORMAL HIGH (ref 3.87–5.11)
RDW: 13.6 % (ref 11.5–15.5)
WBC: 12 10*3/uL — AB (ref 4.0–10.5)

## 2014-11-10 MED ORDER — HYDROMORPHONE HCL 1 MG/ML IJ SOLN
1.0000 mg | Freq: Once | INTRAMUSCULAR | Status: AC
  Administered 2014-11-10: 1 mg via INTRAVENOUS

## 2014-11-10 MED ORDER — ONDANSETRON HCL 4 MG/2ML IJ SOLN
4.0000 mg | Freq: Once | INTRAMUSCULAR | Status: AC
  Administered 2014-11-10: 4 mg via INTRAVENOUS
  Filled 2014-11-10: qty 2

## 2014-11-10 MED ORDER — HYDROMORPHONE HCL 1 MG/ML IJ SOLN
1.0000 mg | Freq: Once | INTRAMUSCULAR | Status: AC
  Administered 2014-11-10: 1 mg via INTRAVENOUS
  Filled 2014-11-10: qty 1

## 2014-11-10 MED ORDER — HYDROCODONE-ACETAMINOPHEN 5-325 MG PO TABS: 1.0000 | ORAL_TABLET | Freq: Four times a day (QID) | ORAL | Status: DC | PRN

## 2014-11-10 MED ORDER — HYDROMORPHONE HCL 1 MG/ML IJ SOLN
INTRAMUSCULAR | Status: AC
  Filled 2014-11-10: qty 1

## 2014-11-10 MED ORDER — SODIUM CHLORIDE 0.9 % IV SOLN: INTRAVENOUS | Status: DC

## 2014-11-10 MED ORDER — SODIUM CHLORIDE 0.9 % IV BOLUS (SEPSIS)
500.0000 mL | Freq: Once | INTRAVENOUS | Status: AC
  Administered 2014-11-10: 500 mL via INTRAVENOUS

## 2014-11-10 NOTE — Discharge Instructions (Signed)
Take medicine as directed. Your pain medicine was renewed. Follow-up the with your doctor on Monday. Return for any new or worse symptoms.

## 2014-11-10 NOTE — ED Notes (Signed)
Per EMS, pt co generalized body aches x 2 days.

## 2014-11-10 NOTE — ED Provider Notes (Signed)
CSN: [id number]     Arrival date & time 11/10/14  1353 History  .This chart was scribed for Carla Mulders, MD by Gwenyth Ober, ED Scribe. This patient was seen in room APA07/APA07 and the patient's care was started at 3:10 PM.    Chief Complaint  Patient presents with  . Generalized Body Aches   The history is provided by the patient. No language interpreter was used.    HPI Comments: Carla [name] is a 64 y.o. female BIBA who presents to the Emergency Department complaining of 10/10, sudden onset headache that started 12 hours ago.  She states dysuria, diarrhea, epigastric abdominal pain, generalized body aches, congestion, photophobia, dizziness and nausea as associated symptoms. Pt also reports baseline knee swelling, cough and SOB that are unchanged today. She has history of migraines and states symptoms are mostly consistent. Pt has chronic joint pain and takes Hydrocodone 10-325 mg. She ran out of pain medication last night. Pt denies vomiting and sore throat as associated symptoms.   PCP Bluth  Past Medical History  Diagnosis Date  . Arthritis   . Bronchitis   . Hypertension   . Chronic back pain   . Chronic knee pain   . COPD (chronic obstructive pulmonary disease)    Past Surgical History  Procedure Laterality Date  . Knee surgery    . Appendectomy    . Tonsillectomy     Family History  Problem Relation Age of Onset  . Heart failure Father   . Diabetes Father   . Kidney failure Father    History  Substance Use Topics  . Smoking status: Former Smoker -- 0.50 packs/day    Quit date: 02/26/2012  . Smokeless tobacco: Not on file  . Alcohol Use: No   OB History    Gravida Para Term Preterm AB TAB SAB Ectopic Multiple Living   Review of Systems  Constitutional: Positive for chills. Negative for fever.  HENT: Positive for congestion and sore throat. Negative for rhinorrhea.   Eyes: Positive for photophobia and visual disturbance.   Respiratory: Positive for cough and shortness of breath.   Cardiovascular: Negative for chest pain and leg swelling.  Gastrointestinal: Positive for abdominal pain and diarrhea. Negative for nausea and vomiting.  Genitourinary: Positive for dysuria.  Musculoskeletal: Positive for back pain and arthralgias. Negative for neck pain.  Skin: Negative for rash.  Neurological: Positive for dizziness and headaches.  Hematological: Does not bruise/bleed easily.  Psychiatric/Behavioral: Negative for confusion.   Allergies  Bee venom; Amitriptyline; Mushroom extract complex; Toradol; and Tramadol  Home Medications   Prior to Admission medications   Medication Sig Start Date End Date Taking? Authorizing Provider  albuterol (PROVENTIL HFA;VENTOLIN HFA) 108 (90 BASE) MCG/ACT inhaler Inhale 2 puffs into the lungs every 6 (six) hours as needed for wheezing. 06/21/13  Yes Nimish Normajean Glasgow, MD  cyclobenzaprine (FLEXERIL) 10 MG tablet Take 10 mg by mouth 3 (three) times daily as needed for muscle spasms.   Yes Historical Provider, MD  diazepam (VALIUM) 5 MG tablet Take 5 mg by mouth 3 (three) times daily as needed for anxiety.   Yes Historical Provider, MD  diphenhydrAMINE (BENADRYL) 25 MG tablet Take 2 tablets (50 mg total) by mouth every 4 (four) hours as needed for itching. 12/17/13  Yes [name], MD  donepezil (ARICEPT) 10 MG tablet Take 10 mg by mouth at bedtime.   Yes Historical  Provider, MD  gabapentin (NEURONTIN) 300 MG capsule Take 300 mg by mouth 3 (three) times daily.   Yes Historical Provider, MD  HYDROcodone-acetaminophen (NORCO) 10-325 MG per tablet Take 1 tablet by mouth every 6 (six) hours as needed for moderate pain or severe pain.   Yes Historical Provider, MD  memantine (NAMENDA) 5 MG tablet Take 2.5 mg by mouth 2 (two) times daily.  09/13/14  Yes Historical Provider, MD  famotidine (PEPCID) 20 MG tablet Take 1 tablet (20 mg total) by mouth 2 (two) times daily. Patient not taking:  Reported on 09/03/2014 12/17/13   [name], MD  HYDROcodone-acetaminophen (NORCO/VICODIN) 5-325 MG per tablet Take 1-2 tablets by mouth every 6 (six) hours as needed for moderate pain. 11/10/14   Carla Mulders, MD  oxyCODONE-acetaminophen (PERCOCET) 5-325 MG per tablet Take 1-2 tablets by mouth every 6 (six) hours as needed. Patient not taking: Reported on 09/13/2014 09/03/14   [name], MD  predniSONE (DELTASONE) 20 MG tablet 2 tabs po daily x 3 days Patient not taking: Reported on 09/03/2014 12/17/13   [name], MD   BP 171/68 mmHg  Pulse 67  Temp(Src) 97.8 F (36.6 C) (Oral)  Resp 11  SpO2 95% Physical Exam  Constitutional: She is oriented to person, place, and time. She appears well-developed and well-nourished. No distress.  HENT:  Head: Normocephalic and atraumatic.  Mucous membranes dry  Eyes: Conjunctivae and EOM are normal. Pupils are equal, round, and reactive to light.  Sclera clear  Neck: Neck supple. No tracheal deviation present.  Cardiovascular: Normal rate, regular rhythm and normal heart sounds.   No murmur heard. Pulmonary/Chest: Effort normal and breath sounds normal. No respiratory distress. She has no wheezes.  Abdominal: Soft. Bowel sounds are normal. There is no tenderness.  Musculoskeletal: She exhibits no edema.  No ankle swelling  Neurological: She is alert and oriented to person, place, and time. No cranial nerve deficit. She exhibits normal muscle tone. Coordination normal.  Skin: Skin is warm and dry.  Psychiatric: She has a normal mood and affect. Her behavior is normal.  Nursing note and vitals reviewed.   ED Course  Procedures (including critical care time) DIAGNOSTIC STUDIES: Oxygen Saturation is 97% on RA, normal by my interpretation.    COORDINATION OF CARE: 3:18 PM Discussed treatment plan with pt at bedside and pt agreed to plan.  Labs Review Labs Reviewed  BASIC METABOLIC PANEL - Abnormal; Notable for the following:     Glucose, Bld 110 (*)    All other components within normal limits  CBC WITH DIFFERENTIAL/PLATELET - Abnormal; Notable for the following:    WBC 12.0 (*)    RBC 5.57 (*)    Hemoglobin 17.0 (*)    HCT 51.3 (*)    Lymphs Abs 5.2 (*)    All other components within normal limits   Results for orders placed or performed during the hospital encounter of 11/10/14  Basic metabolic panel  Result Value Ref Range   Sodium 136 135 - 145 mmol/L   Potassium 3.8 3.5 - 5.1 mmol/L   Chloride 103 96 - 112 mmol/L   CO2 26 19 - 32 mmol/L   Glucose, Bld 110 (H) 70 - 99 mg/dL   BUN 11 6 - 23 mg/dL   Creatinine, Ser 9.57 0.50 - 1.10 mg/dL   Calcium 9.9 8.4 - 47.3 mg/dL   GFR calc non Af Amer >90 >90 mL/min   GFR calc Af Amer >90 >90 mL/min  Anion gap 7 5 - 15  CBC with Differential/Platelet  Result Value Ref Range   WBC 12.0 (H) 4.0 - 10.5 K/uL   RBC 5.57 (H) 3.87 - 5.11 MIL/uL   Hemoglobin 17.0 (H) 12.0 - 15.0 g/dL   HCT 16.1 (H) 09.6 - 04.5 %   MCV 92.1 78.0 - 100.0 fL   MCH 30.5 26.0 - 34.0 pg   MCHC 33.1 30.0 - 36.0 g/dL   RDW 40.9 81.1 - 91.4 %   Platelets 248 150 - 400 K/uL   Neutrophils Relative % 45 43 - 77 %   Neutro Abs 5.5 1.7 - 7.7 K/uL   Lymphocytes Relative 43 12 - 46 %   Lymphs Abs 5.2 (H) 0.7 - 4.0 K/uL   Monocytes Relative 8 3 - 12 %   Monocytes Absolute 1.0 0.1 - 1.0 K/uL   Eosinophils Relative 3 0 - 5 %   Eosinophils Absolute 0.3 0.0 - 0.7 K/uL   Basophils Relative 1 0 - 1 %   Basophils Absolute 0.1 0.0 - 0.1 K/uL   Complex6  Imaging Review No results found.   EKG Interpretation None      MDM   Final diagnoses:  Myalgia    Patient with history of chronic pain. Followed by Dr. Ala Dach in [place]. Patient ran out of her pain medicine last evening. Been out of pain medicine for over 14 hours. Patient with significant increase in her normal baseline joint aches. But seems to be much worse than usual. Associated with a headache and some photophobia. Patient treated  here with pain medicine headache as improved significantly photophobia is resolved. Joint pain is improving but not resolved. Do not feel that patient has any evidence of meningitis. Is no fever. It is possible this could be an early development of a viral illness but most likely due to running out of her pain medicine.  I personally performed the services described in this documentation, which was scribed in my presence. The recorded information has been reviewed and is accurate.     Carla Mulders, MD 11/10/14 [phone]

## 2014-12-12 DIAGNOSIS — R4189 Other symptoms and signs involving cognitive functions and awareness: Secondary | ICD-10-CM

## 2014-12-12 HISTORY — DX: Other symptoms and signs involving cognitive functions and awareness: R41.89

## 2014-12-27 ENCOUNTER — Ambulatory Visit (INDEPENDENT_AMBULATORY_CARE_PROVIDER_SITE_OTHER): Payer: Medicare Other | Admitting: Diagnostic Neuroimaging

## 2014-12-27 ENCOUNTER — Encounter: Payer: Self-pay | Admitting: Diagnostic Neuroimaging

## 2014-12-27 VITALS — BP 141/76 | HR 69 | Temp 97.3°F | Ht 64.0 in | Wt 234.6 lb

## 2014-12-27 DIAGNOSIS — R413 Other amnesia: Secondary | ICD-10-CM

## 2014-12-27 DIAGNOSIS — G894 Chronic pain syndrome: Secondary | ICD-10-CM

## 2014-12-27 DIAGNOSIS — F6811 Factitious disorder with predominantly psychological signs and symptoms: Secondary | ICD-10-CM | POA: Diagnosis not present

## 2014-12-27 DIAGNOSIS — F32A Depression, unspecified: Secondary | ICD-10-CM

## 2014-12-27 DIAGNOSIS — M1710 Unilateral primary osteoarthritis, unspecified knee: Secondary | ICD-10-CM

## 2014-12-27 DIAGNOSIS — M179 Osteoarthritis of knee, unspecified: Secondary | ICD-10-CM

## 2014-12-27 DIAGNOSIS — F329 Major depressive disorder, single episode, unspecified: Secondary | ICD-10-CM

## 2014-12-27 DIAGNOSIS — IMO0002 Reserved for concepts with insufficient information to code with codable children: Secondary | ICD-10-CM

## 2014-12-27 DIAGNOSIS — R4189 Other symptoms and signs involving cognitive functions and awareness: Secondary | ICD-10-CM

## 2014-12-27 NOTE — Progress Notes (Signed)
[facility]  PATIENT: Carla [name] DOB: [birth year]  REFERRING CLINICIAN: Bluth HISTORY FROM: patient and daughter  REASON FOR VISIT: new consult    HISTORICAL  CHIEF COMPLAINT:  Chief Complaint  Patient presents with  . Memory Loss    HISTORY OF PRESENT ILLNESS:   64 year old female with arthritis, chronic pain, hypertension, COPD, tobacco abuse, here for evaluation of memory loss, forgetfulness, confusion events. Review of chart demonstrates multiple hospital evaluations in the past with associated significant psychosocial and situational stress, family members with substance abuse, stealing, discord, as well as ectatic events 2 years ago with death of patient's estranged brother and her husband.  Patient denies any significant memory problems. She states that other people complain that she has forgetfulness confusion and memory problems. Patient's daughter is in the room today. She describes several episodes where the patient mistakes her grandson for her deceased estranged brother. Patient and her daughter show me several photographs of patients grandson, and the patient states that the person in the picture is her deceased brother. Apparently patient has been pointing to empty chairs and stating that her deceased brother is sitting there and questioning why he is in the house. There have been other episodes were patient stares off, speaks slowly, moves slowly, and does not recognize her own family members. This may last for a few minutes or a few hours at a time.  During this evaluation patient is tearful for the majority of evaluation. She denies depression. She states that she is sad about her deceased husband. She has never seen psychiatry or psychologist. She takes diazepam for anxiety. Patient does report stress related to grandchildren who live in the home who do not listen to her or help her. At times she feels helpless.  Patient has significant chronic  pain, knee pain, back pain, and had to go on to disability from her job in 2008 as a result of these pain issues.  Patient was value for memory problems by PCP in October 2015. She was started on donepezil which caused diarrhea. She was switched to [facility] which also cause diarrhea. She stopped these medications.   REVIEW OF SYSTEMS: Full 14 system review of systems performed and notable only for memory loss confusion slurred speech dizziness anxiety not asleep hallucinations insomnia joint pain joint swelling cramps achy muscles sparing sensation blurred vision double vision.  ALLERGIES: Allergies  Allergen Reactions  . Bee Venom Swelling  . Amitriptyline     Has nightmares when using  . Mushroom Extract Complex     Extreme sweating, vomiting  . Toradol [Ketorolac Tromethamine] Nausea And Vomiting  . Tramadol Nausea And Vomiting    HOME MEDICATIONS: Outpatient Prescriptions Prior to Visit  Medication Sig Dispense Refill  . albuterol (PROVENTIL HFA;VENTOLIN HFA) 108 (90 BASE) MCG/ACT inhaler Inhale 2 puffs into the lungs every 6 (six) hours as needed for wheezing. 1 Inhaler 2  . diazepam (VALIUM) 5 MG tablet Take 5 mg by mouth 3 (three) times daily as needed for anxiety.    . diphenhydrAMINE (BENADRYL) 25 MG tablet Take 2 tablets (50 mg total) by mouth every 4 (four) hours as needed for itching. 20 tablet 0  . gabapentin (NEURONTIN) 300 MG capsule Take 300 mg by mouth 3 (three) times daily.    Marland Kitchen HYDROcodone-acetaminophen (NORCO) 10-325 MG per tablet Take 1 tablet by mouth every 6 (six) hours as needed for moderate pain or severe pain.    Marland Kitchen HYDROcodone-acetaminophen (NORCO/VICODIN) 5-325 MG per tablet Take  1-2 tablets by mouth every 6 (six) hours as needed for moderate pain. 20 tablet 0  . memantine (NAMENDA) 5 MG tablet Take 2.5 mg by mouth 2 (two) times daily.     . cyclobenzaprine (FLEXERIL) 10 MG tablet Take 10 mg by mouth 3 (three) times daily as needed for muscle spasms.    Marland Kitchen  donepezil (ARICEPT) 10 MG tablet Take 10 mg by mouth at bedtime.    . famotidine (PEPCID) 20 MG tablet Take 1 tablet (20 mg total) by mouth 2 (two) times daily. (Patient not taking: Reported on 09/03/2014) 10 tablet 0  . oxyCODONE-acetaminophen (PERCOCET) 5-325 MG per tablet Take 1-2 tablets by mouth every 6 (six) hours as needed. (Patient not taking: Reported on 09/13/2014) 20 tablet 0  . predniSONE (DELTASONE) 20 MG tablet 2 tabs po daily x 3 days (Patient not taking: Reported on 09/03/2014) 6 tablet 0   No facility-administered medications prior to visit.    PAST MEDICAL HISTORY: Past Medical History  Diagnosis Date  . Arthritis   . Bronchitis   . Hypertension   . Chronic back pain   . Chronic knee pain   . COPD (chronic obstructive pulmonary disease)     PAST SURGICAL HISTORY: Past Surgical History  Procedure Laterality Date  . Knee surgery  2007  . Appendectomy  1971  . Tonsillectomy  1971    FAMILY HISTORY: Family History  Problem Relation Age of Onset  . Heart failure Father   . Diabetes Father   . Kidney failure Father     SOCIAL HISTORY:  History   Social History  . Marital Status: Widowed    Spouse Name: N/A  . Number of Children: 2  . Years of Education: 12   Occupational History  . Retired    Social History Main Topics  . Smoking status: Current Every Day Smoker -- 0.50 packs/day for 45 years    Last Attempt to Quit: 02/26/2012  . Smokeless tobacco: Not on file  . Alcohol Use: 0.0 oz/week    0 Standard drinks or equivalent per week     Comment: Very rare  . Drug Use: No  . Sexual Activity: Not on file   Other Topics Concern  . Not on file   Social History Narrative   Lives at home with your 2 daughters, son-in-law, 4 grandkids and more   Right and left handed   Caffeine use: very rare .Has coke 2 times a week.        PHYSICAL EXAM  Filed Vitals:   12/27/14 1013  BP: 141/76  Pulse: 69  Temp: 97.3 F (36.3 C)  Height:  (1.626  m)  Weight: 234 lb 9.6 oz (106.414 kg)    Body mass index is 40.25 kg/(m^2).  No exam data present  No flowsheet data found.  GENERAL EXAM: Patient is in no distress; well developed, nourished and groomed; neck is supple  CARDIOVASCULAR: Regular rate and rhythm, no murmurs, no carotid bruits  NEUROLOGIC: MENTAL STATUS: awake, alert, oriented to person, place and time, recent and remote memory intact, normal attention and concentration, language fluent, comprehension intact, naming intact, fund of knowledge appropriate; SLOW RESPONSES. NO FRONTAL RELEASE SIGNS. PATIENT HAS AN EPISODE OF STARING, UNRESPONSIVE, SLOW TO REACT, AND THEN APPARENT LOSS OF MEMORY OF ENTIRE CURRENT EVALUATION. SHE ASKS IF DR. Loney Hering IS IN THE OFFICE. SIGNIFICANTLY TEARFUL. CRANIAL NERVE: no papilledema on fundoscopic exam, pupils equal and reactive to light, visual fields full to confrontation, extraocular  muscles intact, no nystagmus, facial sensation and strength symmetric, hearing intact, palate elevates symmetrically, uvula midline, shoulder shrug symmetric, tongue midline. MOTOR: normal bulk and tone, full strength in the BUE, BLE SENSORY: normal and symmetric to light touch, pinprick, temperature, vibration COORDINATION: finger-nose-finger, fine finger movements normal REFLEXES: deep tendon reflexes TRACE and symmetric; KNEES TENDER GAIT/STATION: narrow based gait; USES WALKER; SLOW TO RISE; UNSTEADY; ANTALGIC GAIT     DIAGNOSTIC DATA (LABS, IMAGING, TESTING) - I reviewed patient records, labs, notes, testing and imaging myself where available.  Lab Results  Component Value Date   WBC 12.0* 11/10/2014   HGB 17.0* 11/10/2014   HCT 51.3* 11/10/2014   MCV 92.1 11/10/2014   PLT 248 11/10/2014      Component Value Date/Time   NA 136 11/10/2014 1518   K 3.8 11/10/2014 1518   CL 103 11/10/2014 1518   CO2 26 11/10/2014 1518   GLUCOSE 110* 11/10/2014 1518   BUN 11 11/10/2014 1518   CREATININE 0.58  11/10/2014 1518   CALCIUM 9.9 11/10/2014 1518   PROT 7.2 09/13/2014 1507   ALBUMIN 3.6 09/13/2014 1507   AST 28 09/13/2014 1507   ALT 29 09/13/2014 1507   ALKPHOS 127* 09/13/2014 1507   BILITOT 0.4 09/13/2014 1507   GFRNONAA >90 11/10/2014 1518   GFRAA >90 11/10/2014 1518   No results found for: CHOL, HDL, LDLCALC, LDLDIRECT, TRIG, CHOLHDL Lab Results  Component Value Date   HGBA1C 6.8* 06/20/2013   No results found for: AXENMMHW80 Lab Results  Component Value Date   TSH 7.694* 06/20/2013   I reviewed images myself and agree with interpretation. -VRP  09/13/14 CT head - no acute findings; normal brain    ASSESSMENT AND PLAN  64 y.o. year old female here with significant psychosocial and situational stress, prolonged grieving from death of her estranged brother as well as husband approximately 2 years ago, chronic pain, anxiety, with intermittent and progressive episodes of short-term memory problems, delusions, hallucinations. MMSE testing by PCP showed 25/30 in Oct 2015 and 27/30 in Feb 2016. Neurologic examination and CT of the head are unremarkable. 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  Ddx: pseudo-dementia, chronic pain, depression  PLAN: - I spent 45 minutes of face to face time with patient. Greater than 50% of time was spent in counseling and coordination of care with patient. In summary we discussed diagnosis, prognosis, treatment options including pain mgmt and psychiatry evaluations. I do not recommend aricept of namenda. Findings discussed with daughter as well. They will discuss with PCP re: referrals to pain mgmt and psychiatry.  Return in about 4 months (around 04/28/2015).      Suanne Marker, MD 12/27/2014, 11:25 AM Certified in Neurology, Neurophysiology and Neuroimaging  [facility] [address], [address] [place], [place] [address] [phone]

## 2014-12-27 NOTE — Patient Instructions (Signed)
Follow up with Dr. Loney Hering. Consider psychiatry/psychology and pain management evaluations.

## 2015-01-11 ENCOUNTER — Emergency Department (HOSPITAL_COMMUNITY): Payer: Medicare Other

## 2015-01-11 ENCOUNTER — Encounter (HOSPITAL_COMMUNITY): Payer: Self-pay | Admitting: Emergency Medicine

## 2015-01-11 ENCOUNTER — Emergency Department (HOSPITAL_COMMUNITY)
Admission: EM | Admit: 2015-01-11 | Discharge: 2015-01-11 | Disposition: A | Discharge status: Home / Self Care | Payer: Medicare Other | Attending: Emergency Medicine | Admitting: Emergency Medicine

## 2015-01-11 DIAGNOSIS — I10 Essential (primary) hypertension: Secondary | ICD-10-CM | POA: Diagnosis not present

## 2015-01-11 DIAGNOSIS — Y998 Other external cause status: Secondary | ICD-10-CM | POA: Insufficient documentation

## 2015-01-11 DIAGNOSIS — W1839XA Other fall on same level, initial encounter: Secondary | ICD-10-CM | POA: Insufficient documentation

## 2015-01-11 DIAGNOSIS — Z720 Tobacco use: Secondary | ICD-10-CM | POA: Diagnosis not present

## 2015-01-11 DIAGNOSIS — G8929 Other chronic pain: Secondary | ICD-10-CM | POA: Insufficient documentation

## 2015-01-11 DIAGNOSIS — Z7982 Long term (current) use of aspirin: Secondary | ICD-10-CM | POA: Diagnosis not present

## 2015-01-11 DIAGNOSIS — J449 Chronic obstructive pulmonary disease, unspecified: Secondary | ICD-10-CM | POA: Diagnosis not present

## 2015-01-11 DIAGNOSIS — M25561 Pain in right knee: Secondary | ICD-10-CM

## 2015-01-11 DIAGNOSIS — Z79899 Other long term (current) drug therapy: Secondary | ICD-10-CM | POA: Insufficient documentation

## 2015-01-11 DIAGNOSIS — S8991XA Unspecified injury of right lower leg, initial encounter: Secondary | ICD-10-CM | POA: Insufficient documentation

## 2015-01-11 DIAGNOSIS — S8992XA Unspecified injury of left lower leg, initial encounter: Secondary | ICD-10-CM | POA: Diagnosis not present

## 2015-01-11 DIAGNOSIS — M170 Bilateral primary osteoarthritis of knee: Secondary | ICD-10-CM | POA: Insufficient documentation

## 2015-01-11 DIAGNOSIS — Y9301 Activity, walking, marching and hiking: Secondary | ICD-10-CM | POA: Diagnosis not present

## 2015-01-11 DIAGNOSIS — M25562 Pain in left knee: Secondary | ICD-10-CM

## 2015-01-11 DIAGNOSIS — Y9289 Other specified places as the place of occurrence of the external cause: Secondary | ICD-10-CM | POA: Insufficient documentation

## 2015-01-11 DIAGNOSIS — R52 Pain, unspecified: Secondary | ICD-10-CM

## 2015-01-11 MED ORDER — HYDROMORPHONE HCL 1 MG/ML IJ SOLN
1.0000 mg | Freq: Once | INTRAMUSCULAR | Status: AC
  Administered 2015-01-11: 1 mg via INTRAVENOUS
  Filled 2015-01-11: qty 1

## 2015-01-11 MED ORDER — HYDROMORPHONE HCL 4 MG PO TABS: 4.0000 mg | ORAL_TABLET | ORAL | Status: DC | PRN

## 2015-01-11 MED ORDER — LORAZEPAM 2 MG/ML IJ SOLN
1.0000 mg | Freq: Once | INTRAMUSCULAR | Status: AC
  Administered 2015-01-11: 1 mg via INTRAVENOUS
  Filled 2015-01-11: qty 1

## 2015-01-11 NOTE — ED Provider Notes (Signed)
CSN: [id number]     Arrival date & time 01/11/15  1905 History  This chart was scribed for Bethann Berkshire, MD by Bronson Curb, ED Scribe. This patient was seen in room APA09/APA09 and the patient's care was started at 7:27 PM.    Chief Complaint  Patient presents with  . Fall    Patient is a 64 y.o. female presenting with fall. The history is provided by the patient. No language interpreter was used.  Fall This is a new problem. The current episode started 1 to 2 hours ago. The problem occurs rarely. The problem has not changed since onset.Pertinent negatives include no chest pain, no abdominal pain and no headaches. Nothing aggravates the symptoms. Nothing relieves the symptoms. She has tried nothing for the symptoms.     HPI Comments: DEVONNE KITCHEN is a 64 y.o. female, brought in by ambulance, who presents to the Emergency Department after a fall that occurred PTA. Per EMS, patient was ambulating with a walker when she felt a pop in her right hip and states pain radiated down her right leg. She reports her knees buckled, however, she was assisted by family who eased her to the floor. She denies head injury or LOC. Patient is complaining of sudden onset, constant, moderate-severe bilateral hip and bilateral knee pain. Patient states she has "bad" knees that require knee replacement surgery. She denies any prior hip surgeries. Pain is exacerbated with movement and pressure. Patient mentions she takes hydrocodone as needed for her chronic pain. No other injuries noted.   Past Medical History  Diagnosis Date  . Arthritis   . Bronchitis   . Hypertension   . Chronic back pain   . Chronic knee pain   . COPD (chronic obstructive pulmonary disease)    Past Surgical History  Procedure Laterality Date  . Knee surgery  2007  . Appendectomy  1971  . Tonsillectomy  1971   Family History  Problem Relation Age of Onset  . Heart failure Father   . Diabetes Father   . Kidney failure Father     History  Substance Use Topics  . Smoking status: Current Every Day Smoker -- 0.50 packs/day for 45 years    Last Attempt to Quit: 02/26/2012  . Smokeless tobacco: Not on file  . Alcohol Use: 0.0 oz/week    0 Standard drinks or equivalent per week     Comment: Very rare   OB History    Gravida Para Term Preterm AB TAB SAB Ectopic Multiple Living   Review of Systems  Constitutional: Negative for appetite change and fatigue.  HENT: Negative for congestion, ear discharge and sinus pressure.   Eyes: Negative for discharge.  Respiratory: Negative for cough.   Cardiovascular: Negative for chest pain.  Gastrointestinal: Negative for abdominal pain and diarrhea.  Genitourinary: Negative for frequency and hematuria.  Musculoskeletal: Positive for arthralgias. Negative for back pain.  Skin: Negative for rash.  Neurological: Negative for seizures, syncope and headaches.  Psychiatric/Behavioral: Negative for hallucinations.      Allergies  Bee venom; Amitriptyline; Mushroom extract complex; Toradol; and Tramadol  Home Medications   Prior to Admission medications   Medication Sig Start Date End Date Taking? Authorizing Provider  albuterol (PROVENTIL HFA;VENTOLIN HFA) 108 (90 BASE) MCG/ACT inhaler Inhale 2 puffs into the lungs every 6 (six) hours as needed for wheezing. 06/21/13   Nimish Normajean Glasgow, MD  cyclobenzaprine (FLEXERIL)  5 MG tablet Take 5 mg by mouth as needed. 12/13/14   Historical Provider, MD  diazepam (VALIUM) 5 MG tablet Take 5 mg by mouth 3 (three) times daily as needed for anxiety.    Historical Provider, MD  diphenhydrAMINE (BENADRYL) 25 MG tablet Take 2 tablets (50 mg total) by mouth every 4 (four) hours as needed for itching. 12/17/13   Wayland Salinas, MD  donepezil (ARICEPT) 10 MG tablet Take 10 mg by mouth at bedtime.    Historical Provider, MD  famotidine (PEPCID) 20 MG tablet Take 1 tablet (20 mg total) by mouth 2 (two) times daily. Patient not taking:  Reported on 09/03/2014 12/17/13   Wayland Salinas, MD  gabapentin (NEURONTIN) 300 MG capsule Take 300 mg by mouth 3 (three) times daily.    Historical Provider, MD  HYDROcodone-acetaminophen (NORCO) 10-325 MG per tablet Take 1 tablet by mouth every 6 (six) hours as needed for moderate pain or severe pain.    Historical Provider, MD  HYDROcodone-acetaminophen (NORCO/VICODIN) 5-325 MG per tablet Take 1-2 tablets by mouth every 6 (six) hours as needed for moderate pain. 11/10/14   Vanetta Mulders, MD  memantine (NAMENDA) 5 MG tablet Take 2.5 mg by mouth 2 (two) times daily.  09/13/14   Historical Provider, MD  metFORMIN (GLUCOPHAGE-XR) 500 MG 24 hr tablet Take 500 mg by mouth daily. 12/15/14   Historical Provider, MD  metoprolol succinate (TOPROL-XL) 25 MG 24 hr tablet Take 25 mg by mouth daily. 12/20/14   Historical Provider, MD  oxyCODONE-acetaminophen (PERCOCET) 5-325 MG per tablet Take 1-2 tablets by mouth every 6 (six) hours as needed. Patient not taking: Reported on 09/13/2014 09/03/14   [name], MD  predniSONE (DELTASONE) 20 MG tablet 2 tabs po daily x 3 days Patient not taking: Reported on 09/03/2014 12/17/13   Wayland Salinas, MD   Triage Vitals: BP 141/77 mmHg  Pulse 83  Temp(Src) 97.5 F (36.4 C) (Oral)  Resp 20  Ht 5\' 4"  (1.626 m)  Wt 220 lb (99.791 kg)  BMI 37.74 kg/m2  SpO2 100%  Physical Exam  Constitutional: She is oriented to person, place, and time. She appears well-developed.  HENT:  Head: Normocephalic.  Eyes: Conjunctivae and EOM are normal. No scleral icterus.  Neck: Neck supple. No thyromegaly present.  Cardiovascular: Normal rate and regular rhythm.  Exam reveals no gallop and no friction rub.   No murmur heard. Pulmonary/Chest: No stridor. She has no wheezes. She has no rales. She exhibits no tenderness.  Abdominal: She exhibits no distension. There is no tenderness. There is no rebound.  Musculoskeletal: She exhibits tenderness. She exhibits no edema.  Both knees tender.   Lymphadenopathy:    She has no cervical adenopathy.  Neurological: She is oriented to person, place, and time. She exhibits normal muscle tone. Coordination normal.  Skin: No rash noted. No erythema.  Psychiatric: She has a normal mood and affect. Her behavior is normal.  Nursing note and vitals reviewed.   ED Course  Procedures (including critical care time)  DIAGNOSTIC STUDIES: Oxygen Saturation is 100% on room air, normal by my interpretation.    COORDINATION OF CARE: At 1931 Discussed treatment plan with patient which includes pain medication. Patient agrees.   Labs Review Labs Reviewed - No data to display  Imaging Review No results found.   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   Not helped by percocet.  Will rx dilaudid and follow up with her md  The chart was scribed for me under my direct supervision.  I personally performed the history, physical, and medical decision making and all procedures in the evaluation of this patient.Bethann Berkshire, MD 01/11/15 [phone]

## 2015-01-11 NOTE — ED Notes (Signed)
Dr Zammit at bedside. 

## 2015-01-11 NOTE — ED Notes (Signed)
Pt. Uncooperative for physical exam. Pt. With left leg bent and tucked under right leg. Pt. Refusing to straighten/move leg. Pt. Hollering and grabbed RN's arm when RN attempted to palpate hips.

## 2015-01-11 NOTE — ED Notes (Signed)
Pt. Sat 99% on room air. Pt. Sitting up in bed sipping on drink.

## 2015-01-11 NOTE — ED Notes (Signed)
Per EMS pt. Was walking with walker and felt pop in right hip, pts. Knees buckled and she was assisted to floor by family. Pt. Did not hit head. No LOC. Pt. C/o bilateral hip and bilateral knee pain. Pt. Also c/o lower back pain.

## 2015-01-11 NOTE — Discharge Instructions (Signed)
Follow up with your md next week. °

## 2015-01-11 NOTE — ED Notes (Signed)
Pt. Sleeping. Pt. Placed on 2L [place]. O2 sats 95%.

## 2015-01-21 ENCOUNTER — Emergency Department (HOSPITAL_COMMUNITY)
Admission: EM | Admit: 2015-01-21 | Discharge: 2015-01-21 | Disposition: A | Discharge status: Home / Self Care | Payer: Medicare Other | Attending: Emergency Medicine | Admitting: Emergency Medicine

## 2015-01-21 ENCOUNTER — Encounter (HOSPITAL_COMMUNITY): Payer: Self-pay | Admitting: *Deleted

## 2015-01-21 ENCOUNTER — Emergency Department (HOSPITAL_COMMUNITY): Payer: Medicare Other

## 2015-01-21 DIAGNOSIS — Z79899 Other long term (current) drug therapy: Secondary | ICD-10-CM | POA: Insufficient documentation

## 2015-01-21 DIAGNOSIS — Z720 Tobacco use: Secondary | ICD-10-CM | POA: Diagnosis not present

## 2015-01-21 DIAGNOSIS — M545 Low back pain: Secondary | ICD-10-CM | POA: Diagnosis present

## 2015-01-21 DIAGNOSIS — M1990 Unspecified osteoarthritis, unspecified site: Secondary | ICD-10-CM | POA: Insufficient documentation

## 2015-01-21 DIAGNOSIS — J449 Chronic obstructive pulmonary disease, unspecified: Secondary | ICD-10-CM | POA: Insufficient documentation

## 2015-01-21 DIAGNOSIS — M5441 Lumbago with sciatica, right side: Secondary | ICD-10-CM

## 2015-01-21 DIAGNOSIS — G8929 Other chronic pain: Secondary | ICD-10-CM | POA: Insufficient documentation

## 2015-01-21 DIAGNOSIS — M5440 Lumbago with sciatica, unspecified side: Secondary | ICD-10-CM | POA: Diagnosis not present

## 2015-01-21 DIAGNOSIS — I10 Essential (primary) hypertension: Secondary | ICD-10-CM | POA: Diagnosis not present

## 2015-01-21 DIAGNOSIS — M5442 Lumbago with sciatica, left side: Secondary | ICD-10-CM

## 2015-01-21 LAB — CBC WITH DIFFERENTIAL/PLATELET
Basophils Absolute: 0 10*3/uL (ref 0.0–0.1)
Basophils Relative: 0 % (ref 0–1)
Eosinophils Absolute: 0.3 10*3/uL (ref 0.0–0.7)
Eosinophils Relative: 2 % (ref 0–5)
HCT: 49.3 % — ABNORMAL HIGH (ref 36.0–46.0)
Hemoglobin: 16.5 g/dL — ABNORMAL HIGH (ref 12.0–15.0)
LYMPHS ABS: 5.5 10*3/uL — AB (ref 0.7–4.0)
Lymphocytes Relative: 45 % (ref 12–46)
MCH: 30.6 pg (ref 26.0–34.0)
MCHC: 33.5 g/dL (ref 30.0–36.0)
MCV: 91.5 fL (ref 78.0–100.0)
Monocytes Absolute: 1 10*3/uL (ref 0.1–1.0)
Monocytes Relative: 8 % (ref 3–12)
NEUTROS PCT: 45 % (ref 43–77)
Neutro Abs: 5.6 10*3/uL (ref 1.7–7.7)
Platelets: 281 10*3/uL (ref 150–400)
RBC: 5.39 MIL/uL — AB (ref 3.87–5.11)
RDW: 13.6 % (ref 11.5–15.5)
WBC: 12.4 10*3/uL — AB (ref 4.0–10.5)

## 2015-01-21 LAB — COMPREHENSIVE METABOLIC PANEL
ALT: 26 U/L (ref 0–35)
ANION GAP: 8 (ref 5–15)
AST: 34 U/L (ref 0–37)
Albumin: 3.7 g/dL (ref 3.5–5.2)
Alkaline Phosphatase: 109 U/L (ref 39–117)
BUN: 12 mg/dL (ref 6–23)
CO2: 27 mmol/L (ref 19–32)
Calcium: 9.1 mg/dL (ref 8.4–10.5)
Chloride: 103 mmol/L (ref 96–112)
Creatinine, Ser: 0.6 mg/dL (ref 0.50–1.10)
GFR calc Af Amer: >90 mL/min (ref >90)
Glucose, Bld: 91 mg/dL (ref 70–99)
POTASSIUM: 4.2 mmol/L (ref 3.5–5.1)
SODIUM: 138 mmol/L (ref 135–145)
Total Bilirubin: 0.5 mg/dL (ref 0.3–1.2)
Total Protein: 7.1 g/dL (ref 6.0–8.3)

## 2015-01-21 MED ORDER — ONDANSETRON HCL 4 MG/2ML IJ SOLN
4.0000 mg | Freq: Once | INTRAMUSCULAR | Status: AC
  Administered 2015-01-21: 4 mg via INTRAVENOUS
  Filled 2015-01-21: qty 2

## 2015-01-21 MED ORDER — SODIUM CHLORIDE 0.9 % IV BOLUS (SEPSIS)
1000.0000 mL | Freq: Once | INTRAVENOUS | Status: AC
  Administered 2015-01-21: 1000 mL via INTRAVENOUS

## 2015-01-21 MED ORDER — HYDROMORPHONE HCL 1 MG/ML IJ SOLN
1.0000 mg | Freq: Once | INTRAMUSCULAR | Status: AC
  Administered 2015-01-21: 1 mg via INTRAVENOUS
  Filled 2015-01-21: qty 1

## 2015-01-21 MED ORDER — HYDROMORPHONE HCL 4 MG PO TABS: 4.0000 mg | ORAL_TABLET | ORAL | Status: DC | PRN

## 2015-01-21 MED ORDER — IBUPROFEN 400 MG PO TABS
600.0000 mg | ORAL_TABLET | Freq: Once | ORAL | Status: AC
  Administered 2015-01-21: 600 mg via ORAL
  Filled 2015-01-21: qty 2

## 2015-01-21 NOTE — ED Provider Notes (Signed)
CSN: [id number]     Arrival date & time 01/21/15  1348 History   First MD Initiated Contact with Patient 01/21/15 1359     Chief Complaint  Patient presents with  . Fall     (Consider location/radiation/quality/duration/timing/severity/associated sxs/prior Treatment) HPI  64 year old female presents with acute on chronic low back pain. She states she's not been feeling well for the past day which includes feeling overall weak and fatigued as well as "dizziness". She describes her dizziness as feeling like she's in a fog and not feeling right. She denies a room spinning sensation or feeling like she cannot pass out. She states she's been getting this intermittently for the past several months and usually indicates she just overall doesn't feel well. While she was packing meat to put into a bag for storage she felt an acute severe pain in her low back that radiated down both legs. Typically radiates down her right leg. The pain was so severe it caused her legs to buckle on her to fall. Her grandson and another friend caught her before she could fall. She did not injure herself with the fall but now has severe back and leg pain. No numbness. No saddle anesthesia, urinary or bowel incontinence. Patient also complaining of a headache that has been gradually worsening since it started about 3 hours ago.  Past Medical History  Diagnosis Date  . Arthritis   . Bronchitis   . Hypertension   . Chronic back pain   . Chronic knee pain   . COPD (chronic obstructive pulmonary disease)    Past Surgical History  Procedure Laterality Date  . Knee surgery  2007  . Appendectomy  1971  . Tonsillectomy  1971   Family History  Problem Relation Age of Onset  . Heart failure Father   . Diabetes Father   . Kidney failure Father    History  Substance Use Topics  . Smoking status: Light Tobacco Smoker -- 0.50 packs/day for 45 years    Types: Cigarettes    Last Attempt to Quit: 02/26/2012  . Smokeless  tobacco: Not on file  . Alcohol Use: 0.0 oz/week    0 Standard drinks or equivalent per week     Comment: Very rare   OB History    Gravida Para Term Preterm AB TAB SAB Ectopic Multiple Living   Review of Systems  Constitutional: Positive for fatigue. Negative for fever.  Respiratory: Negative for cough and shortness of breath.   Cardiovascular: Negative for chest pain.  Gastrointestinal: Negative for nausea, vomiting and abdominal pain.  Genitourinary: Negative for dysuria and frequency.  Musculoskeletal: Positive for back pain.  Neurological: Positive for dizziness, weakness and headaches. Negative for syncope and numbness.  All other systems reviewed and are negative.     Allergies  Bee venom; Amitriptyline; Mushroom extract complex; Toradol; and Tramadol  Home Medications   Prior to Admission medications   Medication Sig Start Date End Date Taking? Authorizing Provider  albuterol (PROVENTIL HFA;VENTOLIN HFA) 108 (90 BASE) MCG/ACT inhaler Inhale 2 puffs into the lungs every 6 (six) hours as needed for wheezing. 06/21/13  Yes Nimish Normajean Glasgow, MD  cyclobenzaprine (FLEXERIL) 5 MG tablet Take 5 mg by mouth 3 (three) times daily as needed for muscle spasms.  12/13/14  Yes Historical Provider, MD  diazepam (VALIUM) 5 MG tablet Take 5 mg by mouth 3 (three) times daily as needed for anxiety.  Yes Historical Provider, MD  gabapentin (NEURONTIN) 300 MG capsule Take 300 mg by mouth 3 (three) times daily.   Yes Historical Provider, MD  HYDROcodone-acetaminophen (NORCO) 10-325 MG per tablet Take 1 tablet by mouth every 6 (six) hours as needed for moderate pain or severe pain.   Yes Historical Provider, MD  HYDROmorphone (DILAUDID) 4 MG tablet Take 1 tablet (4 mg total) by mouth every 4 (four) hours as needed for severe pain. 01/11/15  Yes Bethann Berkshire, MD  memantine (NAMENDA) 5 MG tablet Take 5 mg by mouth 2 (two) times daily. 01/17/15  Yes Historical Provider, MD  metFORMIN  (GLUCOPHAGE-XR) 500 MG 24 hr tablet Take 500 mg by mouth at bedtime.  12/15/14  Yes Historical Provider, MD  metoprolol succinate (TOPROL-XL) 25 MG 24 hr tablet Take 25 mg by mouth at bedtime.  12/20/14  Yes Historical Provider, MD  diphenhydrAMINE (BENADRYL) 25 MG tablet Take 2 tablets (50 mg total) by mouth every 4 (four) hours as needed for itching. Patient not taking: Reported on 01/21/2015 12/17/13   Wayland Salinas, MD  famotidine (PEPCID) 20 MG tablet Take 1 tablet (20 mg total) by mouth 2 (two) times daily. Patient not taking: Reported on 09/03/2014 12/17/13   Wayland Salinas, MD  HYDROcodone-acetaminophen (NORCO/VICODIN) 5-325 MG per tablet Take 1-2 tablets by mouth every 6 (six) hours as needed for moderate pain. Patient not taking: Reported on 01/11/2015 11/10/14   Vanetta Mulders, MD  oxyCODONE-acetaminophen (PERCOCET) 5-325 MG per tablet Take 1-2 tablets by mouth every 6 (six) hours as needed. Patient not taking: Reported on 09/13/2014 09/03/14   [name], MD  predniSONE (DELTASONE) 20 MG tablet 2 tabs po daily x 3 days Patient not taking: Reported on 09/03/2014 12/17/13   Wayland Salinas, MD   BP 144/84 mmHg  Pulse 88  Temp(Src) 97.3 F (36.3 C) (Oral)  Resp 20  Ht  (1.626 m)  Wt 220 lb (99.791 kg)  BMI 37.74 kg/m2  SpO2 96% Physical Exam  Constitutional: She is oriented to person, place, and time. She appears well-developed and well-nourished.  tearful  HENT:  Head: Normocephalic and atraumatic.  Right Ear: External ear normal.  Left Ear: External ear normal.  Nose: Nose normal.  Eyes: EOM are normal. Pupils are equal, round, and reactive to light. Right eye exhibits no discharge. Left eye exhibits no discharge.  Cardiovascular: Normal rate, regular rhythm and normal heart sounds.   Pulmonary/Chest: Effort normal and breath sounds normal.  Abdominal: Soft. There is no tenderness.  Neurological: She is alert and oriented to person, place, and time.  CN 2-12 grossly intact. Equal  strength in upper extremities. Weakness bilaterally in lower extremities but equal, appears mostly limited due to pain as this exacerbates her back symptoms  Skin: Skin is warm and dry.  Nursing note and vitals reviewed.   ED Course  Procedures (including critical care time) Labs Review Labs Reviewed  CBC WITH DIFFERENTIAL/PLATELET - Abnormal; Notable for the following:    WBC 12.4 (*)    RBC 5.39 (*)    Hemoglobin 16.5 (*)    HCT 49.3 (*)    Lymphs Abs 5.5 (*)    All other components within normal limits  COMPREHENSIVE METABOLIC PANEL    Imaging Review Ct Head Wo Contrast  01/21/2015   CLINICAL DATA:  Fall, dizziness, headache  EXAM: CT HEAD WITHOUT CONTRAST  TECHNIQUE: Contiguous axial images were obtained from the base of the skull through the vertex without intravenous contrast.  COMPARISON:  09/13/2014  FINDINGS: No skull fracture is noted. Mild mucosal thickening posterior aspect of the left maxillary sinus.  The mastoid air cells are unremarkable.  No intracranial hemorrhage, mass effect or midline shift.  No acute infarction. No hydrocephalus. No mass lesion is noted on this unenhanced scan.  IMPRESSION: No acute intracranial abnormality. Mild mucosal thickening posterior aspect of the left maxillary sinus.   Electronically Signed   By: Natasha Mead M.D.   On: 01/21/2015 16:53     EKG Interpretation   Date/Time:  Sunday January 21 2015 13:55:40 EDT Ventricular Rate:  88 PR Interval:  148 QRS Duration: 77 QT Interval:  361 QTC Calculation: 437 R Axis:   59 Text Interpretation:  Sinus rhythm Minimal ST depression, inferior leads  Baseline wander in lead(s) I II aVR V1 no significant change since Dec  2015 Confirmed by Iya Hamed  MD, Makaley Storts ([id number]) on 01/21/2015 1:59:17 PM      MDM   Final diagnoses:  Bilateral low back pain with sciatica, sciatica laterality unspecified    Patient's fall appears to be due to an acute exacerbation of her chronic low back pain. She does have  sciatic symptoms but has no incontinence or weakness. After pain control she is able to get up and Amber daily with a walker which is normal for her. The patient has no acute infectious symptoms. Blood work is unremarkable. Given her new onset headache in the last couple hours a CT was obtained and is negative. Is hard to describe what her "dizziness" is from, but does not appear to be vertigo or a syncope/near syncope. Symptoms have resolved currently with pain control. Given patient's primary issue appears to be acute pain, she was given a small dose of narcotics for discharge. She has follow-up with her PCP scheduled for tomorrow already.    Pricilla Loveless, MD 01/21/15 1745

## 2015-01-21 NOTE — ED Notes (Signed)
Pt with fall after c/o dizziness and sharp pain that radiates down one leg, c/o lower back pain

## 2015-01-21 NOTE — ED Notes (Signed)
Pt tolerated ambulation well with walker and assistance

## 2015-01-22 NOTE — ED Provider Notes (Signed)
Received a call from [facility] pharmacy. Patient received prescription for a fall and back pain exacerbation 3/31 for #15 hydrocodone. She subsequently saw her primary physician and got a 30 day supply of hydrocodone from her primary physician on 4/1.  She was seen and evaluated yesterday in our ER for acute on chronic back pain following a fall. She was given a short course of pain medication. Pharmacist concerned given recent prior prescriptions written. I have reviewed the patient's chart. Given that she received a 30 day supply of hydrocodone from her primary physician on 4/1. She should have plenty of medication left for her pain. Discussed with pharmacist not filling current prescription.  Shon Baton, MD 01/22/15 1159

## 2015-02-20 ENCOUNTER — Encounter (HOSPITAL_COMMUNITY): Payer: Self-pay | Admitting: Cardiology

## 2015-02-20 ENCOUNTER — Emergency Department (HOSPITAL_COMMUNITY)
Admission: EM | Admit: 2015-02-20 | Discharge: 2015-02-20 | Disposition: A | Discharge status: Home / Self Care | Payer: Medicare Other | Attending: Emergency Medicine | Admitting: Emergency Medicine

## 2015-02-20 DIAGNOSIS — M1990 Unspecified osteoarthritis, unspecified site: Secondary | ICD-10-CM | POA: Insufficient documentation

## 2015-02-20 DIAGNOSIS — M25461 Effusion, right knee: Secondary | ICD-10-CM | POA: Insufficient documentation

## 2015-02-20 DIAGNOSIS — I10 Essential (primary) hypertension: Secondary | ICD-10-CM | POA: Insufficient documentation

## 2015-02-20 DIAGNOSIS — Z7952 Long term (current) use of systemic steroids: Secondary | ICD-10-CM | POA: Insufficient documentation

## 2015-02-20 DIAGNOSIS — R55 Syncope and collapse: Secondary | ICD-10-CM | POA: Diagnosis present

## 2015-02-20 DIAGNOSIS — Z720 Tobacco use: Secondary | ICD-10-CM | POA: Diagnosis not present

## 2015-02-20 DIAGNOSIS — Z79899 Other long term (current) drug therapy: Secondary | ICD-10-CM | POA: Diagnosis not present

## 2015-02-20 DIAGNOSIS — J449 Chronic obstructive pulmonary disease, unspecified: Secondary | ICD-10-CM | POA: Insufficient documentation

## 2015-02-20 DIAGNOSIS — G8929 Other chronic pain: Secondary | ICD-10-CM | POA: Insufficient documentation

## 2015-02-20 LAB — CBC WITH DIFFERENTIAL/PLATELET
Basophils Absolute: 0 10*3/uL (ref 0.0–0.1)
Basophils Relative: 0 % (ref 0–1)
Eosinophils Absolute: 0.3 10*3/uL (ref 0.0–0.7)
Eosinophils Relative: 3 % (ref 0–5)
HCT: 50.1 % — ABNORMAL HIGH (ref 36.0–46.0)
Hemoglobin: 16.5 g/dL — ABNORMAL HIGH (ref 12.0–15.0)
Lymphocytes Relative: 51 % — ABNORMAL HIGH (ref 12–46)
Lymphs Abs: 5.9 10*3/uL — ABNORMAL HIGH (ref 0.7–4.0)
MCH: 30.2 pg (ref 26.0–34.0)
MCHC: 32.9 g/dL (ref 30.0–36.0)
MCV: 91.8 fL (ref 78.0–100.0)
Monocytes Absolute: 0.8 10*3/uL (ref 0.1–1.0)
Monocytes Relative: 7 % (ref 3–12)
Neutro Abs: 4.4 10*3/uL (ref 1.7–7.7)
Neutrophils Relative %: 39 % — ABNORMAL LOW (ref 43–77)
Platelets: 273 10*3/uL (ref 150–400)
RBC: 5.46 MIL/uL — ABNORMAL HIGH (ref 3.87–5.11)
RDW: 13.7 % (ref 11.5–15.5)
WBC: 11.5 10*3/uL — ABNORMAL HIGH (ref 4.0–10.5)

## 2015-02-20 LAB — BASIC METABOLIC PANEL
Anion gap: 10 (ref 5–15)
BUN: 10 mg/dL (ref 6–20)
CO2: 26 mmol/L (ref 22–32)
Calcium: 8.9 mg/dL (ref 8.9–10.3)
Chloride: 102 mmol/L (ref 101–111)
Creatinine, Ser: 0.63 mg/dL (ref 0.44–1.00)
GFR calc Af Amer: >60 mL/min (ref >60)
GFR calc non Af Amer: >60 mL/min (ref >60)
Glucose, Bld: 96 mg/dL (ref 70–99)
Potassium: 4 mmol/L (ref 3.5–5.1)
Sodium: 138 mmol/L (ref 135–145)

## 2015-02-20 LAB — TROPONIN I: Troponin I: <0.03 ng/mL (ref <0.031)

## 2015-02-20 MED ORDER — DIAZEPAM 5 MG/ML IJ SOLN
5.0000 mg | Freq: Once | INTRAMUSCULAR | Status: AC
  Administered 2015-02-20: 5 mg via INTRAVENOUS
  Filled 2015-02-20: qty 2

## 2015-02-20 MED ORDER — ONDANSETRON HCL 4 MG/2ML IJ SOLN
4.0000 mg | Freq: Once | INTRAMUSCULAR | Status: AC
  Administered 2015-02-20: 4 mg via INTRAMUSCULAR
  Filled 2015-02-20: qty 2

## 2015-02-20 MED ORDER — SODIUM CHLORIDE 0.9 % IV BOLUS (SEPSIS)
1000.0000 mL | Freq: Once | INTRAVENOUS | Status: AC
  Administered 2015-02-20: 1000 mL via INTRAVENOUS

## 2015-02-20 MED ORDER — HYDROMORPHONE HCL 1 MG/ML IJ SOLN
1.0000 mg | Freq: Once | INTRAMUSCULAR | Status: AC
  Administered 2015-02-20: 1 mg via INTRAVENOUS
  Filled 2015-02-20: qty 1

## 2015-02-20 NOTE — Discharge Instructions (Signed)
Syncope °Syncope is a medical term for fainting or passing out. This means you lose consciousness and drop to the ground. People are generally unconscious for less than 5 minutes. You may have some muscle twitches for up to 15 seconds before waking up and returning to normal. Syncope occurs more often in older adults, but it can happen to anyone. While most causes of syncope are not dangerous, syncope can be a sign of a serious medical problem. It is important to seek medical care.  °CAUSES  °Syncope is caused by a sudden drop in blood flow to the brain. The specific cause is often not determined. Factors that can bring on syncope include: °· Taking medicines that lower blood pressure. °· Sudden changes in posture, such as standing up quickly. °· Taking more medicine than prescribed. °· Standing in one place for too long. °· Seizure disorders. °· Dehydration and excessive exposure to heat. °· Low blood sugar (hypoglycemia). °· Straining to have a bowel movement. °· Heart disease, irregular heartbeat, or other circulatory problems. °· Fear, emotional distress, seeing blood, or severe pain. °SYMPTOMS  °Right before fainting, you may: °· Feel dizzy or light-headed. °· Feel nauseous. °· See all white or all black in your field of vision. °· Have cold, clammy skin. °DIAGNOSIS  °Your health care provider will ask about your symptoms, perform a physical exam, and perform an electrocardiogram (ECG) to record the electrical activity of your heart. Your health care provider may also perform other heart or blood tests to determine the cause of your syncope which may include: °· Transthoracic echocardiogram (TTE). During echocardiography, sound waves are used to evaluate how blood flows through your heart. °· Transesophageal echocardiogram (TEE). °· Cardiac monitoring. This allows your health care provider to monitor your heart rate and rhythm in real time. °· Holter monitor. This is a portable device that records your  heartbeat and can help diagnose heart arrhythmias. It allows your health care provider to track your heart activity for several days, if needed. °· Stress tests by exercise or by giving medicine that makes the heart beat faster. °TREATMENT  °In most cases, no treatment is needed. Depending on the cause of your syncope, your health care provider may recommend changing or stopping some of your medicines. °HOME CARE INSTRUCTIONS °· Have someone stay with you until you feel stable. °· Do not drive, use machinery, or play sports until your health care provider says it is okay. °· Keep all follow-up appointments as directed by your health care provider. °· Lie down right away if you start feeling like you might faint. Breathe deeply and steadily. Wait until all the symptoms have passed. °· Drink enough fluids to keep your urine clear or pale yellow. °· If you are taking blood pressure or heart medicine, get up slowly and take several minutes to sit and then stand. This can reduce dizziness. °SEEK IMMEDIATE MEDICAL CARE IF:  °· You have a severe headache. °· You have unusual pain in the chest, abdomen, or back. °· You are bleeding from your mouth or rectum, or you have black or tarry stool. °· You have an irregular or very fast heartbeat. °· You have pain with breathing. °· You have repeated fainting or seizure-like jerking during an episode. °· You faint when sitting or lying down. °· You have confusion. °· You have trouble walking. °· You have severe weakness. °· You have vision problems. °If you fainted, call your local emergency services (911 in [place] Do not drive   yourself to the hospital.  °MAKE SURE YOU: °· Understand these instructions. °· Will watch your condition. °· Will get help right away if you are not doing well or get worse. °Document Released: 09/29/2005 Document Revised: 10/04/2013 Document Reviewed: 11/28/2011 °Exit[employer]® Patient Information ©2015 [employer]. This information is not intended to replace  advice given to you by your health care provider. Make sure you discuss any questions you have with your health care provider. ° °

## 2015-02-20 NOTE — ED Provider Notes (Signed)
CSN: [id number]     Arrival date & time 02/20/15  1557 History   First MD Initiated Contact with Patient 02/20/15 1606     Chief Complaint  Patient presents with  . Loss of Consciousness     (Consider location/radiation/quality/duration/timing/severity/associated sxs/prior Treatment) HPI   64 year old female presenting after syncopal event at home. Patient was out shopping with her daughter had to stop this short because of severe pain in her knees. She has a history of chronic knee and lower back pain. She was at home unloading groceries when she began having worsening knee pain but it felt like "my knees just exploded." She was lowered to the ground and apparently had a brief LOC. No incontinence or oral trauma. No seizure like activity. She denies any chest pain then or currently. No acute respiratory complaints. Side from her knee and lower back pain she has no acute complaints. She was more or less in her usual state of health when she got up this morning. No fevers or chills. Denies history of recurrent syncope.  Past Medical History  Diagnosis Date  . Arthritis   . Bronchitis   . Hypertension   . Chronic back pain   . Chronic knee pain   . COPD (chronic obstructive pulmonary disease)    Past Surgical History  Procedure Laterality Date  . Knee surgery  2007  . Appendectomy  1971  . Tonsillectomy  1971   Family History  Problem Relation Age of Onset  . Heart failure Father   . Diabetes Father   . Kidney failure Father    History  Substance Use Topics  . Smoking status: Light Tobacco Smoker -- 0.50 packs/day for 45 years    Types: Cigarettes    Last Attempt to Quit: 02/26/2012  . Smokeless tobacco: Not on file  . Alcohol Use: 0.0 oz/week    0 Standard drinks or equivalent per week     Comment: Very rare   OB History    Gravida Para Term Preterm AB TAB SAB Ectopic Multiple Living   Review of Systems  All systems reviewed and negative, other than  as noted in HPI.   Allergies  Bee venom; Amitriptyline; Mushroom extract complex; Toradol; and Tramadol  Home Medications   Prior to Admission medications   Medication Sig Start Date End Date Taking? Authorizing Provider  albuterol (PROVENTIL HFA;VENTOLIN HFA) 108 (90 BASE) MCG/ACT inhaler Inhale 2 puffs into the lungs every 6 (six) hours as needed for wheezing. 06/21/13  Yes Nimish Normajean Glasgow, MD  cyclobenzaprine (FLEXERIL) 5 MG tablet Take 5 mg by mouth 3 (three) times daily as needed for muscle spasms.  12/13/14  Yes Historical Provider, MD  diazepam (VALIUM) 5 MG tablet Take 5 mg by mouth 3 (three) times daily as needed for anxiety.   Yes Historical Provider, MD  diphenhydrAMINE (BENADRYL) 25 MG tablet Take 2 tablets (50 mg total) by mouth every 4 (four) hours as needed for itching. 12/17/13  Yes Wayland Salinas, MD  EVZIO 0.4 MG/0.4ML SOAJ Inject 0.4 mLs into the muscle once as needed (for Anaphylaxis/allergic reaction).  02/19/15  Yes Historical Provider, MD  gabapentin (NEURONTIN) 300 MG capsule Take 300 mg by mouth 3 (three) times daily.   Yes Historical Provider, MD  HYDROcodone-acetaminophen (NORCO) 10-325 MG per tablet Take 1 tablet by mouth every 6 (six) hours as needed for moderate pain or severe pain.   Yes  Historical Provider, MD  metFORMIN (GLUCOPHAGE-XR) 500 MG 24 hr tablet Take 500 mg by mouth at bedtime.  12/15/14  Yes Historical Provider, MD  metoprolol succinate (TOPROL-XL) 25 MG 24 hr tablet Take 25 mg by mouth at bedtime.  12/20/14  Yes Historical Provider, MD  VOLTAREN 1 % GEL Apply 4 g topically every 8 (eight) hours.  02/06/15  Yes Historical Provider, MD  famotidine (PEPCID) 20 MG tablet Take 1 tablet (20 mg total) by mouth 2 (two) times daily. Patient not taking: Reported on 09/03/2014 12/17/13   Wayland Salinas, MD  HYDROcodone-acetaminophen (NORCO/VICODIN) 5-325 MG per tablet Take 1-2 tablets by mouth every 6 (six) hours as needed for moderate pain. Patient not taking: Reported on  01/11/2015 11/10/14   Vanetta Mulders, MD  HYDROmorphone (DILAUDID) 4 MG tablet Take 1 tablet (4 mg total) by mouth every 4 (four) hours as needed for severe pain. Patient not taking: Reported on 02/20/2015 01/21/15   Pricilla Loveless, MD  oxyCODONE-acetaminophen (PERCOCET) 5-325 MG per tablet Take 1-2 tablets by mouth every 6 (six) hours as needed. Patient not taking: Reported on 09/13/2014 09/03/14   [name], MD  predniSONE (DELTASONE) 20 MG tablet 2 tabs po daily x 3 days Patient not taking: Reported on 09/03/2014 12/17/13   Wayland Salinas, MD   BP 207/94 mmHg  Pulse 63  Temp(Src) 97.5 F (36.4 C) (Oral)  Resp 23  Ht  (1.626 m)  Wt 225 lb (102.059 kg)  BMI 38.60 kg/m2  SpO2 95% Physical Exam  Constitutional: She is oriented to person, place, and time. She appears well-developed and well-nourished. No distress.  HENT:  Head: Normocephalic and atraumatic.  Eyes: Conjunctivae are normal. Right eye exhibits no discharge. Left eye exhibits no discharge.  Neck: Neck supple.  Cardiovascular: Normal rate, regular rhythm and normal heart sounds.  Exam reveals no gallop and no friction rub.   No murmur heard. Pulmonary/Chest: Effort normal and breath sounds normal. No respiratory distress.  Abdominal: Soft. She exhibits no distension. There is no tenderness.  Musculoskeletal:  Tenderness to b/l knees. Perhaps a small R knee effusion. Reports increased pain with ROM of either knee. NVI.   Neurological: She is alert and oriented to person, place, and time. No cranial nerve deficit. She exhibits normal muscle tone. Coordination normal.  Skin: Skin is warm and dry.  Psychiatric: Thought content normal.  Crying at times  Nursing note and vitals reviewed.   ED Course  Procedures (including critical care time) Labs Review Labs Reviewed  CBC WITH DIFFERENTIAL/PLATELET - Abnormal; Notable for the following:    WBC 11.5 (*)    RBC 5.46 (*)    Hemoglobin 16.5 (*)    HCT 50.1 (*)     Neutrophils Relative % 39 (*)    Lymphocytes Relative 51 (*)    Lymphs Abs 5.9 (*)    All other components within normal limits  BASIC METABOLIC PANEL  TROPONIN I    Imaging Review No results found.   EKG Interpretation   Date/Time:  Tuesday Feb 20 2015 16:01:39 EDT Ventricular Rate:  65 PR Interval:  136 QRS Duration: 80 QT Interval:  406 QTC Calculation: 422 R Axis:   70 Text Interpretation:  Sinus rhythm Abnormal R-wave progression, early  transition Baseline wander in lead(s) V2 since last tracing no significant  change Confirmed by Effie Shy  MD, ELLIOTT ([id number]) on 02/20/2015 4:40:55 PM      MDM   Final diagnoses:  Syncope and collapse  Chronic pain  Chronic pain    64 year old female with what sounds like a syncopal event. Patient seems more preoccupied and discussing her knee pain and she does a syncopal event. Ventricular redirection to discuss it further. Does not sound like she had much of a prodrome aside from the pain in her knees. Potentially passed out secondary to severe pain? Hernia lower back pain is chronic. She is followed by pain management. She denies any recent trauma. She feels that she may have overexerted herself while shopping today which exacerbated her knee pain.   Workup fairly unremarkable. She does have a mild leukocytosis, but this is nonspecific. She is afebrile. She is not tachycardic. Significantly hypertensive on arrival but has since improved somewhat. She reports feeling better and has no further complaints.It has been determined that no acute conditions requiring further emergency intervention are present at this time. The patient has been advised of the diagnosis and plan. I reviewed any labs and imaging including any potential incidental findings. We have discussed signs and symptoms that warrant return to the ED and they are listed in the discharge instructions.    Raeford Razor, MD 02/20/15 [id number]

## 2015-02-20 NOTE — ED Notes (Signed)
EMS called out for unresponsive.  witnessed by daughter and lowered to floor.  Pt c/o dizzy.  Pt unable to stand to get to stretcher.   When ems arrived pt was responsive.  CBG  85.  Hypertensive  190/90.   Per EMS very hot in the room.

## 2015-02-20 NOTE — ED Notes (Signed)
MD at bedside. 

## 2015-02-27 ENCOUNTER — Ambulatory Visit (HOSPITAL_COMMUNITY): Payer: Medicare Other | Attending: Anesthesiology | Admitting: Physical Therapy

## 2015-02-27 DIAGNOSIS — R262 Difficulty in walking, not elsewhere classified: Secondary | ICD-10-CM | POA: Diagnosis not present

## 2015-02-27 DIAGNOSIS — M545 Low back pain: Secondary | ICD-10-CM | POA: Diagnosis not present

## 2015-02-27 DIAGNOSIS — G8929 Other chronic pain: Secondary | ICD-10-CM | POA: Insufficient documentation

## 2015-02-27 DIAGNOSIS — R29898 Other symptoms and signs involving the musculoskeletal system: Secondary | ICD-10-CM | POA: Insufficient documentation

## 2015-02-27 DIAGNOSIS — M5416 Radiculopathy, lumbar region: Secondary | ICD-10-CM

## 2015-02-27 NOTE — Therapy (Signed)
[facility] [address] [place], [place], [address] Phone: [phone]   Fax:  [phone]  Physical Therapy Evaluation  Patient Details  Name: Carla [name] MRN: [id number] Date of Birth: 1951-03-07 Referring Provider:  Tonye Royalty,*  Encounter Date: 02/27/2015      PT End of Session - 02/27/15 1625    Visit Number 1   Number of Visits 4   Date for PT Re-Evaluation 03/29/15   Authorization Type medicare   Authorization - Visit Number 1   Authorization - Number of Visits 4   PT Start Time 1307   PT Stop Time 1348   PT Time Calculation (min) 41 min   Activity Tolerance Patient limited by pain   Behavior During Therapy Restless      Past Medical History  Diagnosis Date  . Arthritis   . Bronchitis   . Hypertension   . Chronic back pain   . Chronic knee pain   . COPD (chronic obstructive pulmonary disease)     Past Surgical History  Procedure Laterality Date  . Knee surgery  2007  . Appendectomy  1971  . Tonsillectomy  1971    There were no vitals filed for this visit.  Visit Diagnosis:  Chronic radicular low back pain  Weakness of both lower extremities  Difficulty walking      Subjective Assessment - 02/27/15 1309    Subjective Pt states she has had chronic neck and back pain.  Ms. Dicochea states that her worse pain is in her low back area with the right side being greater than the left.  She states the pain radiates into her foot.   She is currently ambulating with a rolling walker and has been on the walker since 2007.     Patient is accompained by: Family member   Pertinent History Pt has been to ER 5 times this year for various forms of pain and or dizziness; falls ;  HTN, DM, tobacco use    How long can you sit comfortably? pt has 30 minutes with a HMP    How long can you stand comfortably? unable to stand in one place    How long can you walk comfortably? ambulates with rolling walker very  slowly drags her feet for five minutes    Patient Stated Goals increased activity; be able to walk around the block    Currently in Pain? Yes   Pain Score 8    Pain Location Back   Pain Orientation Left;Lower;Right   Pain Descriptors / Indicators Aching   Pain Radiating Towards  Bilateral LE to toes    Pain Onset More than a month ago   Pain Frequency Constant   Aggravating Factors  activiy    Pain Relieving Factors HMP and medication            [facility] PT Assessment - 02/27/15 1307    Assessment   Medical Diagnosis  lumbar pain    Onset Date --  chronic over 10 yrs ago    Next MD Visit 03/20/2015   Prior Therapy none   Precautions   Precautions Back   Restrictions   Weight Bearing Restrictions No   Balance Screen   Has the patient fallen in the past 6 months Yes   How many times? multiple   Has the patient had a decrease in activity level because of a fear of falling?  Yes   Is the patient reluctant to leave their home  because of a fear of falling?  Yes   Home Environment   Living Enviornment Private residence   Living Arrangements Children   Available Help at Discharge Family   Type of Home House   Home Access Stairs to enter   Entrance Stairs-Number of Steps 4  needs assistance from family to get into her home.    Prior Function   Level of Independence Needs assistance with ADLs   Vocation On disability   Leisure makes quilts, reads    Observation/Other Assessments   Focus on Therapeutic Outcomes (FOTO)  10   Strength   Right Hip Flexion 2-/5  tested sitting    Right Hip ABduction --  tested isometrically with no resistance felt    Left Hip Flexion 2-/5  tested sitting    Left Hip ABduction --  tested isometrically with no resitance felt    Right Knee Extension 3-/5  no resistance    Left Knee Extension 3-/5  no resistance    Right Ankle Dorsiflexion 3/5  no resistance given   Left Ankle Dorsiflexion 3/5  no resistance given                     [facility] Adult PT Treatment/Exercise - 02/27/15 0001    Exercises   Exercises Knee/Hip   Knee/Hip Exercises: Seated   Long Arc Quad Both;5 reps   Other Seated Knee Exercises ankle heel raise/toeraise x 10 ; toe squeeze/heelsqueeze x 10    Other Seated Knee Exercises hip isometric adduction x 10; abdominal isometric x 10; glut set x 10                 PT Education - 02/27/15 1623    Education provided Yes   Education Details glut set; ab set; heel/toe raises, LAQ, toe in/out squeezes    Person(s) Educated Patient   Methods Demonstration;Explanation;Handout   Comprehension Verbalized understanding;Returned demonstration          PT Short Term Goals - 02/27/15 1633    PT SHORT TERM GOAL #1   Title I HEP   Time 1   Period Weeks   PT SHORT TERM GOAL #2   Title Pt to be walking with the walker 15 minutes at least 4 x a week for better health habits.   Time 2   Period Weeks           PT Long Term Goals - 02/27/15 1634    PT LONG TERM GOAL #1   Title I in advance HEP    Time 4   Period Weeks   PT LONG TERM GOAL #2   Title Pt to be walking in the house with a cane 25% of the time    Time 4   Period Weeks   PT LONG TERM GOAL #3   Title Pt mm strength to be improved one grade to decrease risk of falling    Time 4   Period Weeks   PT LONG TERM GOAL #4   Title Pt to be walking with her walker 30 mintues 4 times a week for better health habits    Time 4   Period Weeks   PT LONG TERM GOAL #5   Title Pain level to be at a 5/10 or less 80% of the day   Time 4   Period Weeks               Plan - 02/27/15 1627    Clinical Impression  Statement Pt is a 64 yo female who has chronic pain and has been referred to physical therapy to try and improve her functioning level.  Examination demonstrated poor sitting posture, global LE weakness and cog-wheel mm testing.  Pt states daughter will need to cook dinner after therapist had pt complete the above exercies and the  daughter states that they would be in the ER.  Therapist tried to explain that he only way the patient was going to get stronger was to exercise her mm and completing a long arc quad (straightening the knee while sitting) was not going to harm the pt.  Pt will benefit from skilled therapy to increase her strength to improve her functional abilty.    Pt will benefit from skilled therapeutic intervention in order to improve on the following deficits Abnormal gait;Decreased activity tolerance;Decreased strength;Decreased mobility;Pain;Difficulty walking   Rehab Potential Fair   PT Frequency 1x / week   PT Duration 4 weeks   PT Treatment/Interventions Patient/family education;Therapeutic exercise;Therapeutic activities   PT Next Visit Plan Pt will need pictures to the following; heel raises, functional squat, sit to stand, bridges, bent knee raise.   PT Home Exercise Plan given   Consulted and Agree with Plan of Care Patient;Family member/caregiver   Family Member Consulted daughter          G-Codes - 03-28-15 1637    Functional Limitation Other PT primary   Other PT Primary Current Status [phone]) At least 80 percent but less than 100 percent impaired, limited or restricted   Other PT Primary Goal Status ([id number]) At least 60 percent but less than 80 percent impaired, limited or restricted       Problem List Patient Active Problem List   Diagnosis Date Noted  . COPD exacerbation 06/20/2013  . Anxiety attack 03/08/2012  . COPD (chronic obstructive pulmonary disease) 03/05/2012  . SOB (shortness of breath) 03/04/2012  . Cough 03/04/2012  . Bronchitis 03/04/2012   Virgina Organ, PT CLT [phone] 28-Mar-2015, 4:38 PM  [facility] [address] [place], [place], [address] Phone: [phone]   Fax:  [phone]

## 2015-02-27 NOTE — Patient Instructions (Signed)
Knee Extension (Sitting)   Place __0__ pound weight on left ankle and straighten knee fully, lower slowly. Repeat 10____ times per set. Do ___1_ sets per session. Do __3__ sessions per day.  http://orth.exer.us/732   Copyright  VHI. All rights reserved.  Heel Raise (Sitting)   Raise heels, keeping toes on floor. Repeat __10__ times per set. Do ___1_ sets per session. Do _3___ sessions per day.  http://orth.exer.us/44   Copyright  VHI. All rights reserved.  Toe Raise (Sitting)   Raise toes, keeping heels on floor. Repeat _10___ times per set. Do __1__ sets per session. Do ___3_ sessions per day.  http://orth.exer.us/46   Copyright  VHI. All rights reserved.

## 2015-03-07 ENCOUNTER — Ambulatory Visit (HOSPITAL_COMMUNITY): Payer: Medicare Other | Admitting: Physical Therapy

## 2015-03-07 DIAGNOSIS — M5416 Radiculopathy, lumbar region: Principal | ICD-10-CM

## 2015-03-07 DIAGNOSIS — G8929 Other chronic pain: Secondary | ICD-10-CM

## 2015-03-07 DIAGNOSIS — R262 Difficulty in walking, not elsewhere classified: Secondary | ICD-10-CM

## 2015-03-07 DIAGNOSIS — M545 Low back pain: Secondary | ICD-10-CM | POA: Diagnosis not present

## 2015-03-07 DIAGNOSIS — R29898 Other symptoms and signs involving the musculoskeletal system: Secondary | ICD-10-CM

## 2015-03-07 NOTE — Therapy (Signed)
[facility] [address] [place], [place], [address] Phone: [phone]   Fax:  [phone]  Physical Therapy Treatment  Patient Details  Name: Carla [name] MRN: [id number] Date of Birth: 10-Apr-1951 Referring Provider:  [name], MD  Encounter Date: 03/07/2015      PT End of Session - 03/07/15 1450    Visit Number 2   Number of Visits 4   Date for PT Re-Evaluation 03/29/15   Authorization Type medicare   Authorization - Visit Number 2   Authorization - Number of Visits 4   PT Start Time 1300   PT Stop Time 1350   PT Time Calculation (min) 50 min   Activity Tolerance Patient limited by pain   Behavior During Therapy Restless;Anxious      Past Medical History  Diagnosis Date  . Arthritis   . Bronchitis   . Hypertension   . Chronic back pain   . Chronic knee pain   . COPD (chronic obstructive pulmonary disease)     Past Surgical History  Procedure Laterality Date  . Knee surgery  2007  . Appendectomy  1971  . Tonsillectomy  1971    There were no vitals filed for this visit.  Visit Diagnosis:  No diagnosis found.      Subjective Assessment - 03/07/15 1442    Subjective Pt comes today with her daughter.  States she's been trying to do some of the exericses at home but she is having some cramping with glute sets and abdominal sets that turn into spasms.  Pt also reports theres no way she can (pointing at her HEP) do the LAQ exercise because she cannot lift her legs.  Currently wtih 8/10 pain in her knees and back.   Currently in Pain? Yes   Pain Score 8    Pain Location Back   Pain Orientation Left;Right;Lower   Pain Radiating Towards low back  and knees                         [facility] Adult PT Treatment/Exercise - 03/07/15 1329    Knee/Hip Exercises: Seated   Long Arc Quad Both;10 reps   Other Seated Knee Exercises ankle heel raise/toeraise x 10    Knee/Hip Exercises: Supine   Hip Adduction  Isometric Both;10 reps                  PT Short Term Goals - 02/27/15 1633    PT SHORT TERM GOAL #1   Title I HEP   Time 1   Period Weeks   PT SHORT TERM GOAL #2   Title Pt to be walking with the walker 15 minutes at least 4 x a week for better health habits.   Time 2   Period Weeks           PT Long Term Goals - 02/27/15 1634    PT LONG TERM GOAL #1   Title I in advance HEP    Time 4   Period Weeks   PT LONG TERM GOAL #2   Title Pt to be walking in the house with a cane 25% of the time    Time 4   Period Weeks   PT LONG TERM GOAL #3   Title Pt mm strength to be improved one grade to decrease risk of falling    Time 4   Period Weeks   PT LONG TERM GOAL #4  Title Pt to be walking with her walker 30 mintues 4 times a week for better health habits    Time 4   Period Weeks   PT LONG TERM GOAL #5   Title Pain level to be at a 5/10 or less 80% of the day   Time 4   Period Weeks               Plan - 03/07/15 1451    Clinical Impression Statement Pt very limited by pain with slow, shuffling gait and forward bent posture.  Noted patient with low walker that was adjusted as far up as possible.  Faxed order to MD for new walker and will fax to [facility] when received.  Attempted to progress to standing heelraise, however patient became tearful after completing 4 and with very limited ROM.  Pt also with limited ROM in seated position as well.  Pt without difficulty completing LAQ in seated position or toeraises.  Attempted hip adduction and glute sets in supine, however after completing 5 hip add patient became tearful and had to stop and sit back up.  Pt then asked for daughter to help her move because "my legs arent working anymore".  As patient was ambulating out of clinic, her daughter was behind her pushing her Rt foot along with hers.  Also recommended patient get more supportive shoes (wearing bedroom shoes that were worn to bottom) and lift LE highter  wtih ambulation.     PT Next Visit Plan Progress to standing heel raises, functional squat, sit to stand when able.  Add bridges and bent knee raise next visit.   PT Home Exercise Plan given   Consulted and Agree with Plan of Care Patient;Family member/caregiver   Family Member Consulted daughter        Problem List Patient Active Problem List   Diagnosis Date Noted  . COPD exacerbation 06/20/2013  . Anxiety attack 03/08/2012  . COPD (chronic obstructive pulmonary disease) 03/05/2012  . SOB (shortness of breath) 03/04/2012  . Cough 03/04/2012  . Bronchitis 03/04/2012    Lurena Nida, PTA/CLT [phone]  03/07/2015, 4:44 PM  [facility] [address] [place], [place], [address] Phone: [phone]   Fax:  [phone]

## 2015-03-13 ENCOUNTER — Emergency Department (HOSPITAL_COMMUNITY): Payer: Medicare Other

## 2015-03-13 ENCOUNTER — Encounter (HOSPITAL_COMMUNITY): Payer: Self-pay | Admitting: Emergency Medicine

## 2015-03-13 ENCOUNTER — Emergency Department (HOSPITAL_COMMUNITY)
Admission: EM | Admit: 2015-03-13 | Discharge: 2015-03-13 | Disposition: A | Discharge status: Home / Self Care | Payer: Medicare Other | Attending: Emergency Medicine | Admitting: Emergency Medicine

## 2015-03-13 DIAGNOSIS — M5136 Other intervertebral disc degeneration, lumbar region: Secondary | ICD-10-CM | POA: Insufficient documentation

## 2015-03-13 DIAGNOSIS — M4806 Spinal stenosis, lumbar region: Secondary | ICD-10-CM | POA: Diagnosis not present

## 2015-03-13 DIAGNOSIS — M1990 Unspecified osteoarthritis, unspecified site: Secondary | ICD-10-CM | POA: Diagnosis not present

## 2015-03-13 DIAGNOSIS — R200 Anesthesia of skin: Secondary | ICD-10-CM | POA: Insufficient documentation

## 2015-03-13 DIAGNOSIS — J449 Chronic obstructive pulmonary disease, unspecified: Secondary | ICD-10-CM | POA: Diagnosis not present

## 2015-03-13 DIAGNOSIS — R5381 Other malaise: Secondary | ICD-10-CM | POA: Diagnosis not present

## 2015-03-13 DIAGNOSIS — Z720 Tobacco use: Secondary | ICD-10-CM | POA: Diagnosis not present

## 2015-03-13 DIAGNOSIS — I10 Essential (primary) hypertension: Secondary | ICD-10-CM | POA: Diagnosis not present

## 2015-03-13 DIAGNOSIS — Z79899 Other long term (current) drug therapy: Secondary | ICD-10-CM | POA: Insufficient documentation

## 2015-03-13 DIAGNOSIS — R531 Weakness: Secondary | ICD-10-CM

## 2015-03-13 DIAGNOSIS — M48061 Spinal stenosis, lumbar region without neurogenic claudication: Secondary | ICD-10-CM

## 2015-03-13 DIAGNOSIS — G8929 Other chronic pain: Secondary | ICD-10-CM | POA: Diagnosis not present

## 2015-03-13 DIAGNOSIS — M549 Dorsalgia, unspecified: Secondary | ICD-10-CM | POA: Diagnosis not present

## 2015-03-13 LAB — COMPREHENSIVE METABOLIC PANEL
ALK PHOS: 90 U/L (ref 38–126)
ALT: 26 U/L (ref 14–54)
ANION GAP: 9 (ref 5–15)
AST: 24 U/L (ref 15–41)
Albumin: 3.7 g/dL (ref 3.5–5.0)
BUN: 10 mg/dL (ref 6–20)
CO2: 31 mmol/L (ref 22–32)
Calcium: 9.1 mg/dL (ref 8.9–10.3)
Chloride: 97 mmol/L — ABNORMAL LOW (ref 101–111)
Creatinine, Ser: 0.6 mg/dL (ref 0.44–1.00)
GLUCOSE: 110 mg/dL — AB (ref 65–99)
POTASSIUM: 4.1 mmol/L (ref 3.5–5.1)
SODIUM: 137 mmol/L (ref 135–145)
Total Bilirubin: 0.7 mg/dL (ref 0.3–1.2)
Total Protein: 7.3 g/dL (ref 6.5–8.1)

## 2015-03-13 LAB — CBC
HCT: 47.5 % — ABNORMAL HIGH (ref 36.0–46.0)
Hemoglobin: 15.4 g/dL — ABNORMAL HIGH (ref 12.0–15.0)
MCH: 29.7 pg (ref 26.0–34.0)
MCHC: 32.4 g/dL (ref 30.0–36.0)
MCV: 91.5 fL (ref 78.0–100.0)
Platelets: 255 10*3/uL (ref 150–400)
RBC: 5.19 MIL/uL — AB (ref 3.87–5.11)
RDW: 13.8 % (ref 11.5–15.5)
WBC: 13.5 10*3/uL — ABNORMAL HIGH (ref 4.0–10.5)

## 2015-03-13 MED ORDER — ALBUTEROL SULFATE (2.5 MG/3ML) 0.083% IN NEBU
2.5000 mg | INHALATION_SOLUTION | Freq: Once | RESPIRATORY_TRACT | Status: AC
  Administered 2015-03-13: 2.5 mg via RESPIRATORY_TRACT
  Filled 2015-03-13: qty 3

## 2015-03-13 MED ORDER — IPRATROPIUM BROMIDE 0.02 % IN SOLN: 0.5000 mg | Freq: Once | RESPIRATORY_TRACT | Status: DC

## 2015-03-13 MED ORDER — HYDROMORPHONE HCL 1 MG/ML IJ SOLN
1.0000 mg | Freq: Once | INTRAMUSCULAR | Status: AC
  Administered 2015-03-13: 1 mg via INTRAVENOUS
  Filled 2015-03-13: qty 1

## 2015-03-13 MED ORDER — IPRATROPIUM-ALBUTEROL 0.5-2.5 (3) MG/3ML IN SOLN
3.0000 mL | Freq: Once | RESPIRATORY_TRACT | Status: AC
Start: 2015-03-13 — End: 2015-03-13
  Administered 2015-03-13: 3 mL via RESPIRATORY_TRACT
  Filled 2015-03-13: qty 3

## 2015-03-13 MED ORDER — ALBUTEROL SULFATE (2.5 MG/3ML) 0.083% IN NEBU
5.0000 mg | INHALATION_SOLUTION | Freq: Once | RESPIRATORY_TRACT | Status: DC
Start: 2015-03-13 — End: 2015-03-13

## 2015-03-13 MED ORDER — OXYCODONE-ACETAMINOPHEN 5-325 MG PO TABS
1.0000 | ORAL_TABLET | Freq: Once | ORAL | Status: AC
  Administered 2015-03-13: 1 via ORAL
  Filled 2015-03-13: qty 1

## 2015-03-13 MED ORDER — ONDANSETRON HCL 4 MG/2ML IJ SOLN
4.0000 mg | Freq: Once | INTRAMUSCULAR | Status: AC
  Administered 2015-03-13: 4 mg via INTRAVENOUS
  Filled 2015-03-13: qty 2

## 2015-03-13 NOTE — ED Notes (Addendum)
Pt reports that she could not get off of the toilet today.  States that this happens to her frequently.  States that her legs just don't work sometimes and she cannot lift herself up.  States that her legs lose feeling at times and that when feeling returns she has severe pain in her legs.

## 2015-03-13 NOTE — ED Provider Notes (Addendum)
CSN: [id number]     Arrival date & time 03/13/15  1709 History   First MD Initiated Contact with Patient 03/13/15 1712     Chief Complaint  Patient presents with  . Weakness     (Consider location/radiation/quality/duration/timing/severity/associated sxs/prior Treatment) Patient is a 64 y.o. female presenting with weakness. The history is provided by the patient.  Weakness Pertinent negatives include no chest pain, no abdominal pain, no headaches and no shortness of breath.  Patient with hx htn, chronic back pain, copd, c/o numbness in bilateral legs, and buttock/perineal area, onset today.  Also c/o weakness in bilateral legs.  Pt indicates normally has low back pain, but worse today. Denies injury or fall. Pt states normally walks w cane, and has has walker and wheelchair. States this afternoon went to use bathroom, and then couldn't get off toilet for 45 minutes.  Pt indicates family/ems had to lift off of toilet and bring to ED.  Pt very challenging historian.  Denies fever or chills. Denies urinary retention, or incontinence. Denies any neck or mid to upper back pain.      Past Medical History  Diagnosis Date  . Arthritis   . Bronchitis   . Hypertension   . Chronic back pain   . Chronic knee pain   . COPD (chronic obstructive pulmonary disease)    Past Surgical History  Procedure Laterality Date  . Knee surgery  2007  . Appendectomy  1971  . Tonsillectomy  1971   Family History  Problem Relation Age of Onset  . Heart failure Father   . Diabetes Father   . Kidney failure Father    History  Substance Use Topics  . Smoking status: Light Tobacco Smoker -- 0.50 packs/day for 45 years    Types: Cigarettes    Last Attempt to Quit: 02/26/2012  . Smokeless tobacco: Not on file  . Alcohol Use: 0.0 oz/week    0 Standard drinks or equivalent per week     Comment: Very rare   OB History    Gravida Para Term Preterm AB TAB SAB Ectopic Multiple Living   Review of Systems  Constitutional: Negative for fever and chills.  HENT: Negative for sore throat.   Eyes: Negative for redness.  Respiratory: Negative for cough and shortness of breath.   Cardiovascular: Negative for chest pain and leg swelling.  Gastrointestinal: Negative for vomiting, abdominal pain and diarrhea.  Endocrine: Negative for polyuria.  Genitourinary: Negative for dysuria and flank pain.  Musculoskeletal: Positive for back pain. Negative for neck pain.  Skin: Negative for rash.  Neurological: Positive for weakness and numbness. Negative for headaches.  Hematological: Does not bruise/bleed easily.  Psychiatric/Behavioral: Negative for confusion.      Allergies  Bee venom; Amitriptyline; Mushroom extract complex; Toradol; and Tramadol  Home Medications   Prior to Admission medications   Medication Sig Start Date End Date Taking? Authorizing Provider  albuterol (PROVENTIL HFA;VENTOLIN HFA) 108 (90 BASE) MCG/ACT inhaler Inhale 2 puffs into the lungs every 6 (six) hours as needed for wheezing. 06/21/13   Nimish Normajean Glasgow, MD  cyclobenzaprine (FLEXERIL) 5 MG tablet Take 5 mg by mouth 3 (three) times daily as needed for muscle spasms.  12/13/14   Historical Provider, MD  diazepam (VALIUM) 5 MG tablet Take 5 mg by mouth 3 (three) times daily as needed for anxiety.    Historical Provider, MD  diphenhydrAMINE (BENADRYL) 25  MG tablet Take 2 tablets (50 mg total) by mouth every 4 (four) hours as needed for itching. 12/17/13   Wayland Salinas, MD  EVZIO 0.4 MG/0.4ML SOAJ Inject 0.4 mLs into the muscle once as needed (for Anaphylaxis/allergic reaction).  02/19/15   Historical Provider, MD  famotidine (PEPCID) 20 MG tablet Take 1 tablet (20 mg total) by mouth 2 (two) times daily. Patient not taking: Reported on 09/03/2014 12/17/13   Wayland Salinas, MD  gabapentin (NEURONTIN) 300 MG capsule Take 300 mg by mouth 3 (three) times daily.    Historical Provider, MD  HYDROcodone-acetaminophen (NORCO)  10-325 MG per tablet Take 1 tablet by mouth every 6 (six) hours as needed for moderate pain or severe pain.    Historical Provider, MD  HYDROcodone-acetaminophen (NORCO/VICODIN) 5-325 MG per tablet Take 1-2 tablets by mouth every 6 (six) hours as needed for moderate pain. Patient not taking: Reported on 01/11/2015 11/10/14   Vanetta Mulders, MD  HYDROmorphone (DILAUDID) 4 MG tablet Take 1 tablet (4 mg total) by mouth every 4 (four) hours as needed for severe pain. Patient not taking: Reported on 02/20/2015 01/21/15   Pricilla Loveless, MD  metFORMIN (GLUCOPHAGE-XR) 500 MG 24 hr tablet Take 500 mg by mouth at bedtime.  12/15/14   Historical Provider, MD  metoprolol succinate (TOPROL-XL) 25 MG 24 hr tablet Take 25 mg by mouth at bedtime.  12/20/14   Historical Provider, MD  oxyCODONE-acetaminophen (PERCOCET) 5-325 MG per tablet Take 1-2 tablets by mouth every 6 (six) hours as needed. Patient not taking: Reported on 09/13/2014 09/03/14   [name], MD  predniSONE (DELTASONE) 20 MG tablet 2 tabs po daily x 3 days Patient not taking: Reported on 09/03/2014 12/17/13   Wayland Salinas, MD  VOLTAREN 1 % GEL Apply 4 g topically every 8 (eight) hours.  02/06/15   Historical Provider, MD   BP 136/83 mmHg  Pulse 99  Temp(Src) 97.9 F (36.6 C)  Resp 18  Ht 5\' 4"  (1.626 m)  Wt 225 lb (102.059 kg)  BMI 38.60 kg/m2  SpO2 94% Physical Exam  Constitutional: She is oriented to person, place, and time. She appears well-developed and well-nourished. No distress.  HENT:  Mouth/Throat: Oropharynx is clear and moist.  Eyes: Conjunctivae are normal. No scleral icterus.  Neck: Neck supple. No tracheal deviation present.  Cardiovascular: Normal rate, regular rhythm, normal heart sounds and intact distal pulses.  Exam reveals no gallop and no friction rub.   No murmur heard. Pulmonary/Chest: Effort normal and breath sounds normal. No respiratory distress.  Abdominal: Soft. Normal appearance and bowel sounds are normal. She  exhibits no distension and no mass. There is no tenderness. There is no rebound and no guarding.  Genitourinary:  No cva tenderness  Musculoskeletal: She exhibits no edema or tenderness.  Neurological: She is alert and oriented to person, place, and time.  Decreased sensation perineal area, bil buttocks and lower ext.  Pt can feel pressure, and pinprick down to feet/toes bil, but states is less than normal. Motor bil upper ext 5/5.  On lower ext exam, ?inconsistent effort, will not moves toes at all. w pin prick will move bil ext away/upwards.   Skin: Skin is warm and dry. No rash noted. She is not diaphoretic.  Psychiatric: She has a normal mood and affect.  Nursing note and vitals reviewed.   ED Course  Procedures (including critical care time) Labs Review   Results for orders placed or performed during the hospital encounter of 03/13/15  CBC  Result Value Ref Range   WBC 13.5 (H) 4.0 - 10.5 K/uL   RBC 5.19 (H) 3.87 - 5.11 MIL/uL   Hemoglobin 15.4 (H) 12.0 - 15.0 g/dL   HCT 54.0 (H) 98.1 - 19.1 %   MCV 91.5 78.0 - 100.0 fL   MCH 29.7 26.0 - 34.0 pg   MCHC 32.4 30.0 - 36.0 g/dL   RDW 47.8 29.5 - 62.1 %   Platelets 255 150 - 400 K/uL  Comprehensive metabolic panel  Result Value Ref Range   Sodium 137 135 - 145 mmol/L   Potassium 4.1 3.5 - 5.1 mmol/L   Chloride 97 (L) 101 - 111 mmol/L   CO2 31 22 - 32 mmol/L   Glucose, Bld 110 (H) 65 - 99 mg/dL   BUN 10 6 - 20 mg/dL   Creatinine, Ser 3.08 0.44 - 1.00 mg/dL   Calcium 9.1 8.9 - 65.7 mg/dL   Total Protein 7.3 6.5 - 8.1 g/dL   Albumin 3.7 3.5 - 5.0 g/dL   AST 24 15 - 41 U/L   ALT 26 14 - 54 U/L   Alkaline Phosphatase 90 38 - 126 U/L   Total Bilirubin 0.7 0.3 - 1.2 mg/dL   GFR calc non Af Amer >60 >60 mL/min   GFR calc Af Amer >60 >60 mL/min   Anion gap 9 5 - 15   Mr Lumbar Spine Wo Contrast  03/13/2015   CLINICAL DATA:  Low back pain and bilateral leg weakness for 3 days.  EXAM: MRI LUMBAR SPINE WITHOUT CONTRAST   TECHNIQUE: Multiplanar, multisequence MR imaging of the lumbar spine was performed. No intravenous contrast was administered.  COMPARISON:  Lumbar spine radiographs 10/31/2012  FINDINGS: There is trace anterolisthesis of L4 on L5, likely facet mediated. Vertebral body heights are preserved. Disc desiccation and mild disc space narrowing are present at L2-3 and L4-5 with very mild degenerative endplate changes at these levels. Minimal degenerative endplate edema is noted at L1-2 and L5-S1. The conus medullaris is normal in signal and terminates at L1. Paraspinal soft tissues are unremarkable.  T11-12: Minimal disc bulging without stenosis.  T12-L1:  Minimal disc bulging without stenosis.  L1-2:  Minimal disc bulging without stenosis.  L2-3: Mild disc bulging and moderate facet and ligamentum flavum hypertrophy result in mild spinal stenosis without significant neural foraminal stenosis.  L3-4: Mild disc bulging and mild facet and ligamentum flavum hypertrophy result in borderline spinal stenosis without neural foraminal stenosis.  L4-5: Mild disc bulging and severe facet and ligamentum flavum hypertrophy result in severe spinal stenosis and left lateral recess stenosis. No significant neural foraminal stenosis.  L5-S1:  Mild right facet hypertrophy without stenosis.  IMPRESSION: 1. Severe spinal stenosis at L4-5 due to mild disc degeneration and severe posterior element hypertrophy. 2. Mild multifactorial spinal stenosis at L2-3.   Electronically Signed   By: Sebastian Ache   On: 03/13/2015 19:57      MDM   Iv ns. Labs. Mri.  Pt requests pain med. Dilaudid 1 mg iv.  zofran iv.   Reviewed nursing notes and prior charts for additional history.   Recheck, pt states her earlier 'numbness' is much better/resolving.   Pt is spontaneously moving bilateral lower ext - exam is still very inconsistent, although strength appears much improved from earlier exam.  Pt appears not concerned with any new or acute loss  of functional ability - she indicates she does not feel she needs to be in the hospital, and  says that family does help her at home with her very limited mobility at baseline.  Pt states she has wheelchair, walker and cane for mobility at home. Pt also states has primary care appointment tomorrow, and can be rechecked then.   Neurosurgery consulted, discussed pt with Dr Jordan Likes who has reviewed todays MRI - he states no inpatient tx or emergent procedure is need for pts severe spinal stenosis/L4/L5 noted on MR, and/or current presentation, and that we should provide her referral to follow up with him in his office - he will review MRs w pt then, and discuss tx options.   Pt agreeable w plan.  Pain improved.  Ambulate w assist.  emt indicates pt was able to walk, from room out into hallway and back.   Recheck, mild wheezing. Hx copd. Alb neb.  Pt states has adequate meds,mdi at home.  Recheck pulse ox 95%, no increased wob.   Discussed return precautions.  To return to ER if worse, new functional deficit, numbness/weakness, urinary retention and/or overflow incontinence, fevers, other concern.       Cathren Laine, MD 03/13/15 2132

## 2015-03-13 NOTE — ED Notes (Signed)
Ambulated patient as far as she could tolerate. Went from the bed to the hallway. She stated she walks at home with a walker. Had on issues but was really tired. Made doctor aware.

## 2015-03-13 NOTE — Discharge Instructions (Signed)
It was our pleasure to provide your ER care today - we hope that you feel better.  Fall precautions - use caution, walker, and assistance when ambulating.   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  We discussed your case with our neurosurgeon on call, Dr Jordan Likes - he indicates for you to follow up with him in the office, at which point he will examine you, review your MRIs with you, and discussed treatment options - see referral - call office tomorrow morning to arrange follow up appointment in the next 1-2 weeks.   Also follow up with your primary care doctor tomorrow as planned.  Discuss additional home therapy, physical therapy, and/or assistance.   For copd/wheezing, avoid any smoking. Use inhaler as need. Follow up with your doctor.  Return to ER if worse, new symptoms, fevers, new numbness/weakness, or loss of normal functional ability, inability to void, or incontinence, other concern.  You were given pain medication in the ER - no driving for the next 6 hours.       Spinal Stenosis Spinal stenosis is an abnormal narrowing of the canals of your spine (vertebrae). CAUSES  Spinal stenosis is caused by areas of bone pushing into the central canals of your vertebrae. This condition can be present at birth (congenital). It also may be caused by arthritic deterioration of your vertebrae (spinal degeneration).  SYMPTOMS   Pain that is generally worse with activities, particularly standing and walking.  Numbness, tingling, hot or cold sensations, weakness, or weariness in your legs.  Frequent episodes of falling.  A foot-slapping gait that leads to muscle weakness. DIAGNOSIS  Spinal stenosis is diagnosed with the use of magnetic resonance imaging (MRI) or computed tomography (CT). TREATMENT  Initial therapy for spinal stenosis focuses on the management of the pain and other symptoms associated with the condition. These therapies  include:  Practicing postural changes to lessen pressure on your nerves.  Exercises to strengthen the core of your body.  Loss of excess body weight.  The use of nonsteroidal anti-inflammatory medicines to reduce swelling and inflammation in your nerves. When therapies to manage pain are not successful, surgery to treat spinal stenosis may be recommended. This surgery involves removing excess bone, which puts pressure on your nerve roots. During this surgery (laminectomy), the posterior boney arch (lamina) and excess bone around the facet joints are removed. Document Released: 12/20/2003 Document Revised: 02/13/2014 Document Reviewed: 01/07/2013 [facility] Patient Information 2015 [place], [place]. This information is not intended to replace advice given to you by your health care provider. Make sure you discuss any questions you have with your health care provider.    Degenerative Disk Disease Degenerative disk disease is a condition caused by the changes that occur in the cushions of the backbone (spinal disks) as you grow older. Spinal disks are soft and compressible disks located between the bones of the spine (vertebrae). They act like shock absorbers. Degenerative disk disease can affect the whole spine. However, the neck and lower back are most commonly affected. Many changes can occur in the spinal disks with aging, such as:  The spinal disks may dry and shrink.  Small tears may occur in the tough, outer covering of the disk (annulus).  The disk space may become smaller due to loss of water.  Abnormal growths in the bone (spurs) may occur. This can put pressure on the nerve roots exiting the spinal canal, causing pain.  The spinal  canal may become narrowed. CAUSES  Degenerative disk disease is a condition caused by the changes that occur in the spinal disks with aging. The exact cause is not known, but there is a genetic basis for many patients. Degenerative changes can occur due to  loss of fluid in the disk. This makes the disk thinner and reduces the space between the backbones. Small cracks can develop in the outer layer of the disk. This can lead to the breakdown of the disk. You are more likely to get degenerative disk disease if you are overweight. Smoking cigarettes and doing heavy work such as weightlifting can also increase your risk of this condition. Degenerative changes can start after a sudden injury. Growth of bone spurs can compress the nerve roots and cause pain.  SYMPTOMS  The symptoms vary from person to person. Some people may have no pain, while others have severe pain. The pain may be so severe that it can limit your activities. The location of the pain depends on the part of your backbone that is affected. You will have neck or arm pain if a disk in the neck area is affected. You will have pain in your back, buttocks, or legs if a disk in the lower back is affected. The pain becomes worse while bending, reaching up, or with twisting movements. The pain may start gradually and then get worse as time passes. It may also start after a major or minor injury. You may feel numbness or tingling in the arms or legs.  DIAGNOSIS  Your caregiver will ask you about your symptoms and about activities or habits that may cause the pain. He or she may also ask about any injuries, diseases, or treatments you have had earlier. Your caregiver will examine you to check for the range of movement that is possible in the affected area, to check for strength in your extremities, and to check for sensation in the areas of the arms and legs supplied by different nerve roots. An X-ray of the spine may be taken. Your caregiver may suggest other imaging tests, such as magnetic resonance imaging (MRI), if needed.  TREATMENT  Treatment includes rest, modifying your activities, and applying ice and heat. Your caregiver may prescribe medicines to reduce your pain and may ask you to do some exercises  to strengthen your back. In some cases, you may need surgery. You and your caregiver will decide on the treatment that is best for you. HOME CARE INSTRUCTIONS   Follow proper lifting and walking techniques as advised by your caregiver.  Maintain good posture.  Exercise regularly as advised.  Perform relaxation exercises.  Change your sitting, standing, and sleeping habits as advised. Change positions frequently.  Lose weight as advised.  Stop smoking if you smoke.  Wear supportive footwear. SEEK MEDICAL CARE IF:  Your pain does not go away within 1 to 4 weeks. SEEK IMMEDIATE MEDICAL CARE IF:   Your pain is severe.  You notice weakness in your arms, hands, or legs.  You begin to lose control of your bladder or bowel movements. MAKE SURE YOU:   Understand these instructions.  Will watch your condition.  Will get help right away if you are not doing well or get worse. Document Released: 07/27/2007 Document Revised: 12/22/2011 Document Reviewed: 01/31/2014 [facility] Patient Information 2015 [place], [place]. This information is not intended to replace advice given to you by your health care provider. Make sure you discuss any questions you have with your health care  provider.    Fall Prevention and Home Safety Falls cause injuries and can affect all age groups. It is possible to use preventive measures to significantly decrease the likelihood of falls. There are many simple measures which can make your home safer and prevent falls. OUTDOORS  Repair cracks and edges of walkways and driveways.  Remove high doorway thresholds.  Trim shrubbery on the main path into your home.  Have good outside lighting.  Clear walkways of tools, rocks, debris, and clutter.  Check that handrails are not broken and are securely fastened. Both sides of steps should have handrails.  Have leaves, snow, and ice cleared regularly.  Use sand or salt on walkways during winter months.  In the  garage, clean up grease or oil spills. BATHROOM  Install night lights.  Install grab bars by the toilet and in the tub and shower.  Use non-skid mats or decals in the tub or shower.  Place a plastic non-slip stool in the shower to sit on, if needed.  Keep floors dry and clean up all water on the floor immediately.  Remove soap buildup in the tub or shower on a regular basis.  Secure bath mats with non-slip, double-sided rug tape.  Remove throw rugs and tripping hazards from the floors. BEDROOMS  Install night lights.  Make sure a bedside light is easy to reach.  Do not use oversized bedding.  Keep a telephone by your bedside.  Have a firm chair with side arms to use for getting dressed.  Remove throw rugs and tripping hazards from the floor. KITCHEN  Keep handles on pots and pans turned toward the center of the stove. Use back burners when possible.  Clean up spills quickly and allow time for drying.  Avoid walking on wet floors.  Avoid hot utensils and knives.  Position shelves so they are not too high or low.  Place commonly used objects within easy reach.  If necessary, use a sturdy step stool with a grab bar when reaching.  Keep electrical cables out of the way.  Do not use floor polish or wax that makes floors slippery. If you must use wax, use non-skid floor wax.  Remove throw rugs and tripping hazards from the floor. STAIRWAYS  Never leave objects on stairs.  Place handrails on both sides of stairways and use them. Fix any loose handrails. Make sure handrails on both sides of the stairways are as long as the stairs.  Check carpeting to make sure it is firmly attached along stairs. Make repairs to worn or loose carpet promptly.  Avoid placing throw rugs at the top or bottom of stairways, or properly secure the rug with carpet tape to prevent slippage. Get rid of throw rugs, if possible.  Have an electrician put in a light switch at the top and  bottom of the stairs. OTHER FALL PREVENTION TIPS  Wear low-heel or rubber-soled shoes that are supportive and fit well. Wear closed toe shoes.  When using a stepladder, make sure it is fully opened and both spreaders are firmly locked. Do not climb a closed stepladder.  Add color or contrast paint or tape to grab bars and handrails in your home. Place contrasting color strips on first and last steps.  Learn and use mobility aids as needed. Install an electrical emergency response system.  Turn on lights to avoid dark areas. Replace light bulbs that burn out immediately. Get light switches that glow.  Arrange furniture to create clear pathways. Keep  furniture in the same place.  Firmly attach carpet with non-skid or double-sided tape.  Eliminate uneven floor surfaces.  Select a carpet pattern that does not visually hide the edge of steps.  Be aware of all pets. OTHER HOME SAFETY TIPS  Set the water temperature for 120 F (48.8 C).  Keep emergency numbers on or near the telephone.  Keep smoke detectors on every level of the home and near sleeping areas. Document Released: 09/19/2002 Document Revised: 03/30/2012 Document Reviewed: 12/19/2011 [facility] Patient Information 2015 [place], [place]. This information is not intended to replace advice given to you by your health care provider. Make sure you discuss any questions you have with your health care provider.    Asthma Attack Prevention Although there is no way to prevent asthma from starting, you can take steps to control the disease and reduce its symptoms. Learn about your asthma and how to control it. Take an active role to control your asthma by working with your health care provider to create and follow an asthma action plan. An asthma action plan guides you in:  Taking your medicines properly.  Avoiding things that set off your asthma or make your asthma worse (asthma triggers).  Tracking your level of asthma  control.  Responding to worsening asthma.  Seeking emergency care when needed. To track your asthma, keep records of your symptoms, check your peak flow number using a handheld device that shows how well air moves out of your lungs (peak flow meter), and get regular asthma checkups.  WHAT ARE SOME WAYS TO PREVENT AN ASTHMA ATTACK?  Take medicines as directed by your health care provider.  Keep track of your asthma symptoms and level of control.  With your health care provider, write a detailed plan for taking medicines and managing an asthma attack. Then be sure to follow your action plan. Asthma is an ongoing condition that needs regular monitoring and treatment.  Identify and avoid asthma triggers. Many outdoor allergens and irritants (such as pollen, mold, cold air, and air pollution) can trigger asthma attacks. Find out what your asthma triggers are and take steps to avoid them.  Monitor your breathing. Learn to recognize warning signs of an attack, such as coughing, wheezing, or shortness of breath. Your lung function may decrease before you notice any signs or symptoms, so regularly measure and record your peak airflow with a home peak flow meter.  Identify and treat attacks early. If you act quickly, you are less likely to have a severe attack. You will also need less medicine to control your symptoms. When your peak flow measurements decrease and alert you to an upcoming attack, take your medicine as instructed and immediately stop any activity that may have triggered the attack. If your symptoms do not improve, get medical help.  Pay attention to increasing quick-relief inhaler use. If you find yourself relying on your quick-relief inhaler, your asthma is not under control. See your health care provider about adjusting your treatment. WHAT CAN MAKE MY SYMPTOMS WORSE? A number of common things can set off or make your asthma symptoms worse and cause temporary increased inflammation of  your airways. Keep track of your asthma symptoms for several weeks, detailing all the environmental and emotional factors that are linked with your asthma. When you have an asthma attack, go back to your asthma diary to see which factor, or combination of factors, might have contributed to it. Once you know what these factors are, you can take steps to  control many of them. If you have allergies and asthma, it is important to take asthma prevention steps at home. Minimizing contact with the substance to which you are allergic will help prevent an asthma attack. Some triggers and ways to avoid these triggers are: Animal Dander:  Some people are allergic to the flakes of skin or dried saliva from animals with fur or feathers.   There is no such thing as a hypoallergenic dog or cat breed. All dogs or cats can cause allergies, even if they don't shed.  Keep these pets out of your home.  If you are not able to keep a pet outdoors, keep the pet out of your bedroom and other sleeping areas at all times, and keep the door closed.  Remove carpets and furniture covered with cloth from your home. If that is not possible, keep the pet away from fabric-covered furniture and carpets. Dust Mites: Many people with asthma are allergic to dust mites. Dust mites are tiny bugs that are found in every home in mattresses, pillows, carpets, fabric-covered furniture, bedcovers, clothes, stuffed toys, and other fabric-covered items.   Cover your mattress in a special dust-proof cover.  Cover your pillow in a special dust-proof cover, or wash the pillow each week in hot water. Water must be hotter than 130 F (54.4 C) to kill dust mites. Cold or warm water used with detergent and bleach can also be effective.  Wash the sheets and blankets on your bed each week in hot water.  Try not to sleep or lie on cloth-covered cushions.  Call ahead when traveling and ask for a smoke-free hotel room. Bring your own bedding and  pillows in case the hotel only supplies feather pillows and down comforters, which may contain dust mites and cause asthma symptoms.  Remove carpets from your bedroom and those laid on concrete, if you can.  Keep stuffed toys out of the bed, or wash the toys weekly in hot water or cooler water with detergent and bleach. Cockroaches: Many people with asthma are allergic to the droppings and remains of cockroaches.   Keep food and garbage in closed containers. Never leave food out.  Use poison baits, traps, powders, gels, or paste (for example, boric acid).  If a spray is used to kill cockroaches, stay out of the room until the odor goes away. Indoor Mold:  Fix leaky faucets, pipes, or other sources of water that have mold around them.  Clean floors and moldy surfaces with a fungicide or diluted bleach.  Avoid using humidifiers, vaporizers, or swamp coolers. These can spread molds through the air. Pollen and Outdoor Mold:  When pollen or mold spore counts are high, try to keep your windows closed.  Stay indoors with windows closed from late morning to afternoon. Pollen and some mold spore counts are highest at that time.  Ask your health care provider whether you need to take anti-inflammatory medicine or increase your dose of the medicine before your allergy season starts. Other Irritants to Avoid:  Tobacco smoke is an irritant. If you smoke, ask your health care provider how you can quit. Ask family members to quit smoking, too. Do not allow smoking in your home or car.  If possible, do not use a wood-burning stove, kerosene heater, or fireplace. Minimize exposure to all sources of smoke, including incense, candles, fires, and fireworks.  Try to stay away from strong odors and sprays, such as perfume, talcum powder, hair spray, and paints.  Decrease humidity  in your home and use an indoor air cleaning device. Reduce indoor humidity to below 60%. Dehumidifiers or central air  conditioners can do this.  Decrease house dust exposure by changing furnace and air cooler filters frequently.  Try to have someone else vacuum for you once or twice a week. Stay out of rooms while they are being vacuumed and for a short while afterward.  If you vacuum, use a dust mask from a hardware store, a double-layered or microfilter vacuum cleaner bag, or a vacuum cleaner with a HEPA filter.  Sulfites in foods and beverages can be irritants. Do not drink beer or wine or eat dried fruit, processed potatoes, or shrimp if they cause asthma symptoms.  Cold air can trigger an asthma attack. Cover your nose and mouth with a scarf on cold or windy days.  Several health conditions can make asthma more difficult to manage, including a runny nose, sinus infections, reflux disease, psychological stress, and sleep apnea. Work with your health care provider to manage these conditions.  Avoid close contact with people who have a respiratory infection such as a cold or the flu, since your asthma symptoms may get worse if you catch the infection. Wash your hands thoroughly after touching items that may have been handled by people with a respiratory infection.  Get a flu shot every year to protect against the flu virus, which often makes asthma worse for days or weeks. Also get a pneumonia shot if you have not previously had one. Unlike the flu shot, the pneumonia shot does not need to be given yearly. Medicines:  Talk to your health care provider about whether it is safe for you to take aspirin or non-steroidal anti-inflammatory medicines (NSAIDs). In a small number of people with asthma, aspirin and NSAIDs can cause asthma attacks. These medicines must be avoided by people who have known aspirin-sensitive asthma. It is important that people with aspirin-sensitive asthma read labels of all over-the-counter medicines used to treat pain, colds, coughs, and fever.  Beta-blockers and ACE inhibitors are other  medicines you should discuss with your health care provider. HOW CAN I FIND OUT WHAT I AM ALLERGIC TO? Ask your asthma health care provider about allergy skin testing or blood testing (the RAST test) to identify the allergens to which you are sensitive. If you are found to have allergies, the most important thing to do is to try to avoid exposure to any allergens that you are sensitive to as much as possible. Other treatments for allergies, such as medicines and allergy shots (immunotherapy) are available.  CAN I EXERCISE? Follow your health care provider's advice regarding asthma treatment before exercising. It is important to maintain a regular exercise program, but vigorous exercise or exercise in cold, humid, or dry environments can cause asthma attacks, especially for those people who have exercise-induced asthma. Document Released: 09/17/2009 Document Revised: 10/04/2013 Document Reviewed: 04/06/2013 [facility] Patient Information 2015 [place], [place]. This information is not intended to replace advice given to you by your health care provider. Make sure you discuss any questions you have with your health care provider.    Smoking Hazards Smoking cigarettes is extremely bad for your health. Tobacco smoke has over 200 known poisons in it. It contains the poisonous gases nitrogen oxide and carbon monoxide. There are over 60 chemicals in tobacco smoke that cause cancer. Some of the chemicals found in cigarette smoke include:   Cyanide.   Benzene.   Formaldehyde.   Methanol (wood alcohol).  Acetylene (fuel used in welding torches).   Ammonia.  Even smoking lightly shortens your life expectancy by several years. You can greatly reduce the risk of medical problems for you and your family by stopping now. Smoking is the most preventable cause of death and disease in our society. Within days of quitting smoking, your circulation improves, you decrease the risk of having a heart attack, and  your lung capacity improves. There may be some increased phlegm in the first few days after quitting, and it may take months for your lungs to clear up completely. Quitting for 10 years reduces your risk of developing lung cancer to almost that of a nonsmoker.  WHAT ARE THE RISKS OF SMOKING? Cigarette smokers have an increased risk of many serious medical problems, including:  Lung cancer.   Lung disease (such as pneumonia, bronchitis, and emphysema).   Heart attack and chest pain due to the heart not getting enough oxygen (angina).   Heart disease and peripheral blood vessel disease.   Hypertension.   Stroke.   Oral cancer (cancer of the lip, mouth, or voice box).   Bladder cancer.   Pancreatic cancer.   Cervical cancer.   Pregnancy complications, including premature birth.   Stillbirths and smaller newborn babies, birth defects, and genetic damage to sperm.   Early menopause.   Lower estrogen level for women.   Infertility.   Facial wrinkles.   Blindness.   Increased risk of broken bones (fractures).   Senile dementia.   Stomach ulcers and internal bleeding.   Delayed wound healing and increased risk of complications during surgery. Because of secondhand smoke exposure, children of smokers have an increased risk of the following:   Sudden infant death syndrome (SIDS).   Respiratory infections.   Lung cancer.   Heart disease.   Ear infections.  WHY IS SMOKING ADDICTIVE? Nicotine is the chemical agent in tobacco that is capable of causing addiction or dependence. When you smoke and inhale, nicotine is absorbed rapidly into the bloodstream through your lungs. Both inhaled and noninhaled nicotine may be addictive.  WHAT ARE THE BENEFITS OF QUITTING?  There are many health benefits to quitting smoking. Some are:   The likelihood of developing cancer and heart disease decreases. Health improvements are seen almost immediately.    Blood pressure, pulse rate, and breathing patterns start returning to normal soon after quitting.   People who quit may see an improvement in their overall quality of life.  HOW DO YOU QUIT SMOKING? Smoking is an addiction with both physical and psychological effects, and longtime habits can be hard to change. Your health care provider can recommend:  Programs and community resources, which may include group support, education, or therapy.  Replacement products, such as patches, gum, and nasal sprays. Use these products only as directed. Do not replace cigarette smoking with electronic cigarettes (commonly called e-cigarettes). The safety of e-cigarettes is unknown, and some may contain harmful chemicals. FOR MORE INFORMATION  American Lung Association: www.lung.org  American Cancer Society: www.cancer.org Document Released: 11/06/2004 Document Revised: 07/20/2013 Document Reviewed: 03/21/2013 [facility] Patient Information 2015 [place], [place]. This information is not intended to replace advice given to you by your health care provider. Make sure you discuss any questions you have with your health care provider.

## 2015-03-13 NOTE — ED Notes (Signed)
Pt c/o bilateral knee and lower back pain causing her to be unable to get up from her toilet at home. Pt had to call EMS to get her up and requested to come to ED. Pt has history of chronic back/knee pain.

## 2015-03-15 ENCOUNTER — Encounter (HOSPITAL_COMMUNITY): Payer: Medicare Other

## 2015-03-19 ENCOUNTER — Other Ambulatory Visit: Payer: Self-pay | Admitting: Neurosurgery

## 2015-03-19 DIAGNOSIS — M48061 Spinal stenosis, lumbar region without neurogenic claudication: Secondary | ICD-10-CM

## 2015-03-20 ENCOUNTER — Ambulatory Visit (HOSPITAL_COMMUNITY): Payer: Medicare Other | Attending: Anesthesiology

## 2015-03-20 DIAGNOSIS — M5416 Radiculopathy, lumbar region: Secondary | ICD-10-CM

## 2015-03-20 DIAGNOSIS — G8929 Other chronic pain: Secondary | ICD-10-CM | POA: Insufficient documentation

## 2015-03-20 DIAGNOSIS — M545 Low back pain: Secondary | ICD-10-CM | POA: Diagnosis not present

## 2015-03-20 DIAGNOSIS — R262 Difficulty in walking, not elsewhere classified: Secondary | ICD-10-CM | POA: Diagnosis not present

## 2015-03-20 DIAGNOSIS — R29898 Other symptoms and signs involving the musculoskeletal system: Secondary | ICD-10-CM | POA: Insufficient documentation

## 2015-03-21 ENCOUNTER — Encounter (HOSPITAL_COMMUNITY): Payer: Self-pay

## 2015-03-21 ENCOUNTER — Ambulatory Visit
Admission: RE | Admit: 2015-03-21 | Discharge: 2015-03-21 | Disposition: A | Discharge status: Home / Self Care | Payer: Medicare Other | Source: Ambulatory Visit | Attending: Neurosurgery | Admitting: Neurosurgery

## 2015-03-21 DIAGNOSIS — M48061 Spinal stenosis, lumbar region without neurogenic claudication: Secondary | ICD-10-CM

## 2015-03-21 MED ORDER — METHYLPREDNISOLONE ACETATE 40 MG/ML INJ SUSP (RADIOLOG
120.0000 mg | Freq: Once | INTRAMUSCULAR | Status: AC
  Administered 2015-03-21: 120 mg via EPIDURAL

## 2015-03-21 MED ORDER — IOHEXOL 180 MG/ML  SOLN
1.0000 mL | Freq: Once | INTRAMUSCULAR | Status: AC | PRN
  Administered 2015-03-21: 1 mL via EPIDURAL

## 2015-03-21 NOTE — Discharge Instructions (Signed)

## 2015-03-21 NOTE — Therapy (Signed)
[facility] [address] [place], [place], [address] Phone: [phone]   Fax:  [phone]  Physical Therapy Treatment  Patient Details  Name: Carla [name] MRN: [id number] Date of Birth: 1951/05/28 Referring Provider:  Tonye Royalty,*  Encounter Date: 03/20/2015      PT End of Session - 03/20/15 1357    Visit Number 3   Number of Visits 4   Date for PT Re-Evaluation 03/29/15   Authorization Type medicare   Authorization - Visit Number 3   Authorization - Number of Visits 4   PT Start Time 1302   PT Stop Time 1403   PT Time Calculation (min) 61 min   Equipment Utilized During Treatment --  Blue physio ball    Activity Tolerance Patient tolerated treatment well;Patient limited by pain   Behavior During Therapy Anxious;Agitated      Past Medical History  Diagnosis Date  . Arthritis   . Bronchitis   . Hypertension   . Chronic back pain   . Chronic knee pain   . COPD (chronic obstructive pulmonary disease)     Past Surgical History  Procedure Laterality Date  . Knee surgery  2007  . Appendectomy  1971  . Tonsillectomy  1971    There were no vitals filed for this visit.  Visit Diagnosis:  Chronic radicular low back pain  Weakness of both lower extremities  Difficulty walking      Subjective Assessment - 03/20/15 1315    Subjective Pt reports that she has been feeling slightly worse, including a trip to the ER 7da. Pt reportedly sat on toilet to void, at which point she suddenly lost motor and sensory function in BLE completely, with exception to posterior pelvic area superior the gluteal cleft. EMS was activated, but removing pt from BR took 45 mnutes as pt  has small bathroom and was flaccid in BLE. 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 Function returned shortly thereafter and pt DC with plans for patient to recieve spinal injections on 6/8 in [place], but pt unable to specify exactly what procedure will be taking place. Presents today with continued 8/10 pain, and consistent reports on intermittent muslce spasms in the bilat feet and HS, that are painfull and require her daughter to stretch/kneed out. Pt reports that HEP continues to remain ineffective adn aggravating adn hence has not been compliant to avoid additional flares.    Pertinent History Pt reports history of chronic low back pain, but only in the last 6-8 months has there been involvement of intermittent neurological signs. PMHx: HTN, DM, tobacco use. Pt with history of frequent hospitalizations due to pain or dizziness spells. Pt still using tobacco.    Currently in Pain? Yes   Pain Score 8    Pain Location Back   Pain Orientation Lower   Pain Descriptors / Indicators Burning;Jabbing;Constant;Cramping   Pain Type Chronic pain   Pain Onset More than a month ago   Pain Frequency Constant       Therex at this visit: -trunk flexion stretches, seated in chair: 3x45s feet elevated on 4" riser -seated forward flexion AAROM with blue physioball; x25 -seated forward flexion c mild R deviation AAROM with blue physioball; x5  HEP: Forward flexion AAROM seated at kitchen table with arms/elbows sliding forward on table  5x20 or to tolerance.                        PT Education - 03/20/15 1319    Education provided Yes   Education Details Instructed pt on neutral spinal curavatures and anatomical basis for prolonged lordosis to contribute to central canal narrowing and subsequent neurological changes. Pt instructed to avoid prolonged hyperlordosis throughout day and also given instructions for easy, tolerable movements pt can perform at home to promote gentle lumbar flexion ROM and mobilization.    Person(s) Educated Patient   Methods  Explanation;Demonstration;Tactile cues   Comprehension Verbalized understanding;Returned demonstration;Need further instruction          PT Short Term Goals - 02/27/15 1633    PT SHORT TERM GOAL #1   Title I HEP   Time 1   Period Weeks   PT SHORT TERM GOAL #2   Title Pt to be walking with the walker 15 minutes at least 4 x a week for better health habits.   Time 2   Period Weeks           PT Long Term Goals - 02/27/15 1634    PT LONG TERM GOAL #1   Title I in advance HEP    Time 4   Period Weeks   PT LONG TERM GOAL #2   Title Pt to be walking in the house with a cane 25% of the time    Time 4   Period Weeks   PT LONG TERM GOAL #3   Title Pt mm strength to be improved one grade to decrease risk of falling    Time 4   Period Weeks   PT LONG TERM GOAL #4   Title Pt to be walking with her walker 30 mintues 4 times a week for better health habits    Time 4   Period Weeks   PT LONG TERM GOAL #5   Title Pain level to be at a 5/10 or less 80% of the day   Time 4   Period Weeks               Plan - 03/20/15 1358    Clinical Impression Statement Pt continues to demonstrate progressive neurological signs commonly associated with spinal stenosis. Will cater new HEP and future treatment sessions to patient tolerance as irritability remains high. Upon exaination, patient remains in lumbar lordosis during all postural changes, including forward flexion while sitting to touch shoes. Pt educated on how lumbar flexion allows for greater opening of the spinal foramen as well as the central canal of tehe spine, and that the frst step in her plan will be to engage more gentle flexion based activities in hopes to reduce irritability. Pt presenting with normal light touch sensation at visit, however no assessment of strength tolerated at this time. Pt with severe and sharp increase in pain provoked y overpressure into trunk flexion as well as laughing. Pt continues to present with  impairment of strength, balance, gait speed, pain, and activity tolerance adn will continue to benefit from skilled intervention to address the above deficits and to improve indepdendence in all ADL, IADL, and community distance ambulation.      Pt will benefit from skilled therapeutic intervention in order to improve on the following deficits Abnormal gait;Decreased activity tolerance;Decreased strength;Decreased mobility;Pain;Difficulty walking;Postural dysfunction;Increased muscle spasms;Improper body mechanics;Impaired flexibility;Obesity;Decreased range of motion;Hypomobility   Rehab Potential Fair   PT Frequency 1x / week  PT Duration 4 weeks   PT Treatment/Interventions Patient/family education;Therapeutic exercise;Therapeutic activities;Gait training;Manual techniques;Functional mobility training;Balance training;Passive range of motion   PT Next Visit Plan Continue with gentle flexion oriented activities and begin manual therapy to pt tolerance.    PT Home Exercise Plan Updated since initial visit.    Consulted and Agree with Plan of Care Patient        Problem List Patient Active Problem List   Diagnosis Date Noted  . COPD exacerbation 06/20/2013  . Anxiety attack 03/08/2012  . COPD (chronic obstructive pulmonary disease) 03/05/2012  . SOB (shortness of breath) 03/04/2012  . Cough 03/04/2012  . Bronchitis 03/04/2012    Buccola,Allan C 03/21/2015, 9:09 AM  9:10 AM  Rosamaria Lints, PT, DPT [place] License # [id number]       [facility] [address] [place], [place], [address] Phone: [phone]   Fax:  [phone]

## 2015-03-21 NOTE — Patient Instructions (Signed)
In structed pt to DC previous HEP and to focus on new activity given. Also instructed patient to get details on any movement precautions given by the doctor after the injections on 6/8.

## 2015-03-27 ENCOUNTER — Ambulatory Visit (HOSPITAL_COMMUNITY): Payer: Medicare Other | Admitting: Physical Therapy

## 2015-03-27 DIAGNOSIS — M5416 Radiculopathy, lumbar region: Principal | ICD-10-CM

## 2015-03-27 DIAGNOSIS — M545 Low back pain: Secondary | ICD-10-CM | POA: Diagnosis not present

## 2015-03-27 DIAGNOSIS — R29898 Other symptoms and signs involving the musculoskeletal system: Secondary | ICD-10-CM

## 2015-03-27 DIAGNOSIS — G8929 Other chronic pain: Secondary | ICD-10-CM

## 2015-03-27 DIAGNOSIS — R262 Difficulty in walking, not elsewhere classified: Secondary | ICD-10-CM

## 2015-03-27 NOTE — Therapy (Signed)
[place], [place], [address] Phone: [phone]   Fax:  [phone]  Physical Therapy Treatment  Patient Details  Name: Carla [name] MRN: [id number] Date of Birth: 11-May-1951 Referring Provider:  Celedonio Savage, MD  Encounter Date: 03/27/2015      PT End of Session - 03/27/15 1412    Visit Number 4   Number of Visits 4   Date for PT Re-Evaluation 03/29/15   Authorization Type medicare   Authorization - Visit Number 4   Authorization - Number of Visits 4   PT Start Time 1350   PT Stop Time 1430   PT Time Calculation (min) 40 min      Past Medical History  Diagnosis Date  . Arthritis   . Bronchitis   . Hypertension   . Chronic back pain   . Chronic knee pain   . COPD (chronic obstructive pulmonary disease)     Past Surgical History  Procedure Laterality Date  . Knee surgery  2007  . Appendectomy  1971  . Tonsillectomy  1971    There were no vitals filed for this visit.  Visit Diagnosis:  No diagnosis found.      Subjective Assessment - 03/27/15 1415    Subjective Pt states that she has been trying to do her exercises.  States she had shots in her back last week    How long can you sit comfortably? pt is able to sit for 30 minuteswas 30 minutes    How long can you stand comfortably? Pt is unable to stand in one place   How long can you walk comfortably? Pt ambulates with rolling walker for up to 30 minutes minutes was five minutes.    Patient Stated Goals increased activity; be able to walk around the block    Currently in Pain? Yes   Pain Score 8    Pain Location Back   Pain Orientation Lower   Pain Descriptors / Indicators Burning;Aching            [facility] PT Assessment - 03/27/15 0001    Assessment   Medical Diagnosis  lumbar pain    Onset Date/Surgical Date --  chronic over 10 yrs ago    Next MD Visit 03/20/2015   Prior Therapy none   Precautions   Precautions Back   Restrictions   Weight Bearing Restrictions No   Home Environment   Living Environment Private residence   Living Arrangements Children   Available Help at Discharge Family   Type of [place] to enter   Entrance Stairs-Number of Steps 4  needs assistance from family to get into her home.    Prior Function   Level of Independence Needs assistance with ADLs   Vocation On disability   Leisure makes quilts, reads    Observation/Other Assessments   Focus on Therapeutic Outcomes (FOTO)  10   Strength   Right Hip Flexion 2-/5  tested sitting    Right Hip ABduction --  tested isometrically with no resistance felt    Left Hip Flexion 2-/5  tested sitting    Left Hip ABduction --  tested isometrically with no resitance felt    Right Knee Extension 3/5  no resistance was 3-/5   Left Knee Extension 3/5  no resistance was 3/5   Right Ankle Dorsiflexion 3/5  no resistance given was 3/5   Left Ankle Dorsiflexion 3/5  no resistance given  was 3/5                     [facility] Adult PT Treatment/Exercise - 2015/03/31 0001    Exercises   Exercises Lumbar   Lumbar Exercises: Stretches   Single Knee to Chest Stretch 3 reps;20 seconds   Pelvic Tilt 5 reps   Lumbar Exercises: Standing   Heel Raises 5 reps   Functional Squats 5 reps   Lumbar Exercises: Supine   Ab Set 5 reps   Bent Knee Raise 10 reps   Bridge 5 reps   Knee/Hip Exercises: Standing   Heel Raises 5 reps                PT Education - 03/31/15 1412    Education provided Yes   Education Details new HEP   Person(s) Educated Patient   Methods Explanation;Handout   Comprehension Verbalized understanding;Returned demonstration          PT Short Term Goals - 03-31-2015 1413    PT SHORT TERM GOAL #1   Title I HEP   Time 1   Period Weeks   Status Achieved   PT SHORT TERM GOAL #2   Title Pt to be walking with the walker 15 minutes at least 4 x a week for better health habits.    Time 2   Period Weeks   Status Partially Met           PT Long Term Goals - 03/31/15 1414    PT LONG TERM GOAL #1   Title I in advance HEP    Time 4   Period Weeks   Status On-going   PT LONG TERM GOAL #2   Title Pt to be walking in the house with a cane 25% of the time    Time 4   Period Weeks   Status Not Met   PT LONG TERM GOAL #3   Title Pt mm strength to be improved one grade to decrease risk of falling    Time 4   Period Weeks   Status Not Met   PT LONG TERM GOAL #4   Title Pt to be walking with her walker 30 mintues 4 times a week for better health habits    Time 4   Period Weeks   Status Not Met   PT LONG TERM GOAL #5   Title Pain level to be at a 5/10 or less 80% of the day   Time 4   Status Not Met               Plan - 03-31-2015 1418    Clinical Impression Statement Pt has not shown any significant improvement with therapy will discharge to a HEP.  Pt given HEP for new exercises as well as a walking program    PT Next Visit Plan Discharge pt due to limited improvement.           G-Codes - 2015/03/31 1423    Functional Limitation Other PT primary   Other PT Primary Goal Status (Z3007) At least 60 percent but less than 80 percent impaired, limited or restricted   Other PT Primary Discharge Status [phone]) At least 60 percent but less than 80 percent impaired, limited or restricted      Problem List Patient Active Problem List   Diagnosis Date Noted  . COPD exacerbation 06/20/2013  . Anxiety attack 03/08/2012  . COPD (chronic obstructive pulmonary disease) 03/05/2012  . SOB (shortness of  breath) 03/04/2012  . Cough 03/04/2012  . Bronchitis 03/04/2012  Rayetta Humphrey, PT CLT [phone] 03/27/2015, 2:32 PM  [facility] [address] [place], [place], [address] Phone: [phone]   Fax:  [phone]   PHYSICAL THERAPY DISCHARGE SUMMARY   Plan: Patient agrees to discharge.  Patient goals were  partially met. Patient is being discharged due to lack of progress.  ?????

## 2015-03-27 NOTE — Patient Instructions (Signed)
Knee-to-Chest Stretch: Unilateral   With hand behind right knee, pull knee in to chest until a comfortable stretch is felt in lower back and buttocks. Keep back relaxed. Hold _20___ seconds. Repeat ___3_ times per set. Do _1___ sets per session. Do ____2 sessions per day.  http://orth.exer.us/126   Copyright  VHI. All rights reserved.  Pelvic Tilt   Flatten back by tightening stomach muscles and buttocks. Repeat ___10_ times per set. Do _1___ sets per session. Do __2__ sessions per day.  http://orth.exer.us/134   Copyright  VHI. All rights reserved.  Bent Leg Lift (Hook-Lying)   Tighten stomach and slowly raise right leg __2__ inches from floor. Keep trunk rigid. Hold 3____ seconds. Repeat _5-10___ times per set. Do ___1_ sets per session. Do ___2_ sessions per day.  http://orth.exer.us/1090   Copyright  VHI. All rights reserved.  Functional Quadriceps: Chair Squat   Keeping feet flat on floor, shoulder width apart, squat as low as is comfortable. Use  The kitchen counter as support Repeat _5-10___ times per set. Do __1__ sets per session. Do __1__ sessions per day.  http://orth.exer.us/736   Copyright  VHI. All rights reserved.  Heel Raise: Bilateral (Standing)   Rise on balls of feet. Repeat 5-_10___ times per set. Do _1___ sets per session. Do _2___ sessions per day.  http://orth.exer.us/38   Copyright  VHI. All rights reserved.

## 2015-04-03 ENCOUNTER — Ambulatory Visit (HOSPITAL_COMMUNITY): Payer: Medicare Other | Admitting: Physical Therapy

## 2015-04-07 ENCOUNTER — Encounter (HOSPITAL_COMMUNITY): Payer: Self-pay | Admitting: Emergency Medicine

## 2015-04-07 ENCOUNTER — Emergency Department (HOSPITAL_COMMUNITY)
Admission: EM | Admit: 2015-04-07 | Discharge: 2015-04-07 | Disposition: A | Discharge status: Home / Self Care | Payer: Medicare Other | Attending: Emergency Medicine | Admitting: Emergency Medicine

## 2015-04-07 DIAGNOSIS — M5440 Lumbago with sciatica, unspecified side: Secondary | ICD-10-CM

## 2015-04-07 DIAGNOSIS — Y92090 Kitchen in other non-institutional residence as the place of occurrence of the external cause: Secondary | ICD-10-CM | POA: Insufficient documentation

## 2015-04-07 DIAGNOSIS — R42 Dizziness and giddiness: Secondary | ICD-10-CM | POA: Insufficient documentation

## 2015-04-07 DIAGNOSIS — Y9389 Activity, other specified: Secondary | ICD-10-CM | POA: Insufficient documentation

## 2015-04-07 DIAGNOSIS — S3992XA Unspecified injury of lower back, initial encounter: Secondary | ICD-10-CM | POA: Insufficient documentation

## 2015-04-07 DIAGNOSIS — I10 Essential (primary) hypertension: Secondary | ICD-10-CM | POA: Diagnosis not present

## 2015-04-07 DIAGNOSIS — W1839XA Other fall on same level, initial encounter: Secondary | ICD-10-CM | POA: Insufficient documentation

## 2015-04-07 DIAGNOSIS — G8929 Other chronic pain: Secondary | ICD-10-CM | POA: Insufficient documentation

## 2015-04-07 DIAGNOSIS — Z79899 Other long term (current) drug therapy: Secondary | ICD-10-CM | POA: Diagnosis not present

## 2015-04-07 DIAGNOSIS — M1990 Unspecified osteoarthritis, unspecified site: Secondary | ICD-10-CM | POA: Diagnosis not present

## 2015-04-07 DIAGNOSIS — Z720 Tobacco use: Secondary | ICD-10-CM | POA: Diagnosis not present

## 2015-04-07 DIAGNOSIS — J449 Chronic obstructive pulmonary disease, unspecified: Secondary | ICD-10-CM | POA: Diagnosis not present

## 2015-04-07 DIAGNOSIS — Y998 Other external cause status: Secondary | ICD-10-CM | POA: Insufficient documentation

## 2015-04-07 LAB — COMPREHENSIVE METABOLIC PANEL
ALT: 24 U/L (ref 14–54)
AST: 19 U/L (ref 15–41)
Albumin: 3.6 g/dL (ref 3.5–5.0)
Alkaline Phosphatase: 105 U/L (ref 38–126)
Anion gap: 9 (ref 5–15)
BUN: 14 mg/dL (ref 6–20)
CO2: 26 mmol/L (ref 22–32)
CREATININE: 0.55 mg/dL (ref 0.44–1.00)
Calcium: 8.6 mg/dL — ABNORMAL LOW (ref 8.9–10.3)
Chloride: 103 mmol/L (ref 101–111)
Glucose, Bld: 99 mg/dL (ref 65–99)
POTASSIUM: 4.4 mmol/L (ref 3.5–5.1)
Sodium: 138 mmol/L (ref 135–145)
Total Bilirubin: 0.6 mg/dL (ref 0.3–1.2)
Total Protein: 6.9 g/dL (ref 6.5–8.1)

## 2015-04-07 LAB — URINE MICROSCOPIC-ADD ON

## 2015-04-07 LAB — URINALYSIS, ROUTINE W REFLEX MICROSCOPIC
BILIRUBIN URINE: NEGATIVE
GLUCOSE, UA: NEGATIVE mg/dL
KETONES UR: NEGATIVE mg/dL
LEUKOCYTES UA: NEGATIVE
NITRITE: NEGATIVE
PH: 5.5 (ref 5.0–8.0)
Protein, ur: NEGATIVE mg/dL
Specific Gravity, Urine: <1.005 — ABNORMAL LOW (ref 1.005–1.030)
Urobilinogen, UA: 0.2 mg/dL (ref 0.0–1.0)

## 2015-04-07 LAB — CBC WITH DIFFERENTIAL/PLATELET
Basophils Absolute: 0.1 10*3/uL (ref 0.0–0.1)
Basophils Relative: 0 % (ref 0–1)
EOS ABS: 0.3 10*3/uL (ref 0.0–0.7)
EOS PCT: 3 % (ref 0–5)
HCT: 49.2 % — ABNORMAL HIGH (ref 36.0–46.0)
Hemoglobin: 16 g/dL — ABNORMAL HIGH (ref 12.0–15.0)
Lymphocytes Relative: 42 % (ref 12–46)
Lymphs Abs: 5.5 10*3/uL — ABNORMAL HIGH (ref 0.7–4.0)
MCH: 30 pg (ref 26.0–34.0)
MCHC: 32.5 g/dL (ref 30.0–36.0)
MCV: 92.1 fL (ref 78.0–100.0)
MONO ABS: 1.3 10*3/uL — AB (ref 0.1–1.0)
Monocytes Relative: 10 % (ref 3–12)
NEUTROS ABS: 6.1 10*3/uL (ref 1.7–7.7)
NEUTROS PCT: 45 % (ref 43–77)
Platelets: 228 10*3/uL (ref 150–400)
RBC: 5.34 MIL/uL — ABNORMAL HIGH (ref 3.87–5.11)
RDW: 14.1 % (ref 11.5–15.5)
WBC: 13.2 10*3/uL — AB (ref 4.0–10.5)

## 2015-04-07 LAB — I-STAT TROPONIN, ED: Troponin i, poc: 0 ng/mL (ref 0.00–0.08)

## 2015-04-07 MED ORDER — HYDROMORPHONE HCL 1 MG/ML IJ SOLN
1.0000 mg | Freq: Once | INTRAMUSCULAR | Status: AC
  Administered 2015-04-07: 1 mg via INTRAVENOUS
  Filled 2015-04-07: qty 1

## 2015-04-07 MED ORDER — ONDANSETRON HCL 4 MG/2ML IJ SOLN
4.0000 mg | Freq: Once | INTRAMUSCULAR | Status: AC
  Administered 2015-04-07: 4 mg via INTRAVENOUS
  Filled 2015-04-07: qty 2

## 2015-04-07 NOTE — ED Provider Notes (Signed)
CSN: [id number]     Arrival date & time 04/07/15  1840 History   First MD Initiated Contact with Patient 04/07/15 1914     Chief Complaint  Patient presents with  . Back Pain     The history is provided by the patient and a relative. No language interpreter was used.   Ms. Morford presents for evaluation of acute on chronic back pain and near syncope.  She has chronic low back pain and weakness, with pain that radiates down her legs.  She had a spinal injection for this pain two weeks ago without improvement in her sxs and with possible worsening in her pain.  She reports muscle spasms in her legs and lower extremity weakness, chronic intermittent urinary incontinence.  She was at the store today, which is more activity than usual and had increased pain in her low back.  When she went home she was in the kitchen and turned quickly and felt a sharp spasm of pain in her low back radiating down her left leg and her leg buckled and she fell.  She felt dizzy and lightheaded with this and possibly passed out.  Since that time she has had increased pain and spasms in her legs.  She has no missed medications or new medications.  She has had diarrhea since yesterday.  No chest pain, SOB, fever, hematochezia, abdominal pain, N/V.   Past Medical History  Diagnosis Date  . Arthritis   . Bronchitis   . Hypertension   . Chronic back pain   . Chronic knee pain   . COPD (chronic obstructive pulmonary disease)    Past Surgical History  Procedure Laterality Date  . Knee surgery  2007  . Appendectomy  1971  . Tonsillectomy  1971   Family History  Problem Relation Age of Onset  . Heart failure Father   . Diabetes Father   . Kidney failure Father    History  Substance Use Topics  . Smoking status: Light Tobacco Smoker -- 0.50 packs/day for 45 years    Types: Cigarettes    Last Attempt to Quit: 02/26/2012  . Smokeless tobacco: Not on file  . Alcohol Use: 0.0 oz/week    0 Standard drinks or  equivalent per week     Comment: Very rare   OB History    Gravida Para Term Preterm AB TAB SAB Ectopic Multiple Living   Review of Systems  All other systems reviewed and are negative.     Allergies  Bee venom; Amitriptyline; Mushroom extract complex; Toradol; Tramadol; and Trazodone and nefazodone  Home Medications   Prior to Admission medications   Medication Sig Start Date End Date Taking? Authorizing Provider  albuterol (PROVENTIL HFA;VENTOLIN HFA) 108 (90 BASE) MCG/ACT inhaler Inhale 2 puffs into the lungs every 6 (six) hours as needed for wheezing. 06/21/13  Yes Nimish Normajean Glasgow, MD  cyclobenzaprine (FLEXERIL) 5 MG tablet Take 5 mg by mouth 3 (three) times daily as needed for muscle spasms.  12/13/14  Yes Historical Provider, MD  diazepam (VALIUM) 5 MG tablet Take 5 mg by mouth 3 (three) times daily as needed for anxiety.   Yes Historical Provider, MD  diphenhydrAMINE (BENADRYL) 25 MG tablet Take 25 mg by mouth every 6 (six) hours as needed for allergies.   Yes Historical Provider, MD  gabapentin (NEURONTIN) 300 MG capsule Take 300 mg by mouth 3 (three) times daily.  Yes Historical Provider, MD  HYDROcodone-acetaminophen (NORCO) 10-325 MG per tablet Take 1 tablet by mouth every 6 (six) hours as needed for moderate pain or severe pain.   Yes Historical Provider, MD  metFORMIN (GLUCOPHAGE-XR) 500 MG 24 hr tablet Take 500 mg by mouth at bedtime.  12/15/14  Yes Historical Provider, MD  metoprolol succinate (TOPROL-XL) 25 MG 24 hr tablet Take 25 mg by mouth at bedtime.  12/20/14  Yes Historical Provider, MD  VOLTAREN 1 % GEL Apply 4 g topically every 8 (eight) hours.  02/06/15  Yes Historical Provider, MD  diphenhydrAMINE (BENADRYL) 25 MG tablet Take 2 tablets (50 mg total) by mouth every 4 (four) hours as needed for itching. 12/17/13   Wayland Salinas, MD  EVZIO 0.4 MG/0.4ML SOAJ Inject 0.4 mLs into the muscle once as needed (for Anaphylaxis/allergic reaction).  02/19/15    Historical Provider, MD  famotidine (PEPCID) 20 MG tablet Take 1 tablet (20 mg total) by mouth 2 (two) times daily. Patient not taking: Reported on 09/03/2014 12/17/13   Wayland Salinas, MD  HYDROcodone-acetaminophen (NORCO/VICODIN) 5-325 MG per tablet Take 1-2 tablets by mouth every 6 (six) hours as needed for moderate pain. Patient not taking: Reported on 01/11/2015 11/10/14   Vanetta Mulders, MD  HYDROmorphone (DILAUDID) 4 MG tablet Take 1 tablet (4 mg total) by mouth every 4 (four) hours as needed for severe pain. Patient not taking: Reported on 02/20/2015 01/21/15   Pricilla Loveless, MD  oxyCODONE-acetaminophen (PERCOCET) 5-325 MG per tablet Take 1-2 tablets by mouth every 6 (six) hours as needed. Patient not taking: Reported on 09/13/2014 09/03/14   [name], MD  predniSONE (DELTASONE) 20 MG tablet 2 tabs po daily x 3 days Patient not taking: Reported on 09/03/2014 12/17/13   Wayland Salinas, MD   BP 148/60 mmHg  Pulse 55  Temp(Src) 97.6 F (36.4 C) (Oral)  Resp 16  Ht 5\' 4"  (1.626 m)  Wt 225 lb (102.059 kg)  BMI 38.60 kg/m2  SpO2 97% Physical Exam  Constitutional: She is oriented to person, place, and time. She appears well-developed and well-nourished.  Position of comfort lying supine with knees slightly flexed over a pillow.    HENT:  Head: Normocephalic and atraumatic.  Cardiovascular: Normal rate and regular rhythm.   No murmur heard. Pulmonary/Chest: Effort normal and breath sounds normal. No respiratory distress.  Abdominal: Soft. There is no tenderness. There is no rebound and no guarding.  Musculoskeletal:  2+ DP pulses.  Pain in low back and bilateral legs with active and passive ROM in BLE.  Unclear exact distribution of pain. Nonpitting edema of BLE.  Neurological: She is alert and oriented to person, place, and time.  4+/5 strength in BLE.  Sensation to light touch intact throughout BLE but feels "different" per pt.  No saddle anesthesia.    Skin: Skin is warm and dry.   Psychiatric:  Flat affect  Nursing note and vitals reviewed.   ED Course  Procedures (including critical care time) Labs Review Labs Reviewed  CBC WITH DIFFERENTIAL/PLATELET - Abnormal; Notable for the following:    WBC 13.2 (*)    RBC 5.34 (*)    Hemoglobin 16.0 (*)    HCT 49.2 (*)    Lymphs Abs 5.5 (*)    Monocytes Absolute 1.3 (*)    All other components within normal limits  URINALYSIS, ROUTINE W REFLEX MICROSCOPIC (NOT AT [facility]) - Abnormal; Notable for the following:    Specific Gravity, Urine <1.005 (*)  Hgb urine dipstick TRACE (*)    All other components within normal limits  URINE MICROSCOPIC-ADD ON  COMPREHENSIVE METABOLIC PANEL  I-STAT TROPOININ, ED    Imaging Review No results found.   EKG Interpretation   Date/Time:  Saturday April 07 2015 20:40:32 EDT Ventricular Rate:  57 PR Interval:  139 QRS Duration: 79 QT Interval:  432 QTC Calculation: 421 R Axis:   60 Text Interpretation:  Sinus rhythm Baseline wander in lead(s) V2 Confirmed  by Lincoln Brigham [phone]) on 04/07/2015 9:08:33 PM      MDM   Final diagnoses:  Midline low back pain with sciatica, sciatica laterality unspecified    Pt with hx/o chronic back pain, here with acute on chronic pain.  She does have some lower extremity weakness secondary to pain.  Recent spinal injection two weeks ago.  Presentation is not c/w cauda equina or epidural abscess.  Pt improved on recheck after pain meds.  Pt did have near syncope vs syncope, likely secondary to pain.  Presentation not c/w ACS, PE, cardiogenic syncope.  CBC with leukocytosis similar to priors.  Discussed home care and return precautions.      Tilden Fossa, MD 04/08/15 2325

## 2015-04-07 NOTE — ED Notes (Signed)
Patient with c/o lower back pain that started this afternoon. Flare up of chronic pain. States she is dizzy.

## 2015-04-07 NOTE — Discharge Instructions (Signed)

## 2015-04-10 ENCOUNTER — Encounter (HOSPITAL_COMMUNITY): Payer: Medicare Other | Admitting: Physical Therapy

## 2015-04-23 ENCOUNTER — Emergency Department (HOSPITAL_COMMUNITY): Payer: Medicare Other

## 2015-04-23 ENCOUNTER — Encounter (HOSPITAL_COMMUNITY): Payer: Self-pay | Admitting: *Deleted

## 2015-04-23 ENCOUNTER — Emergency Department (HOSPITAL_COMMUNITY)
Admission: EM | Admit: 2015-04-23 | Discharge: 2015-04-23 | Disposition: A | Discharge status: Home / Self Care | Payer: Medicare Other | Attending: Emergency Medicine | Admitting: Emergency Medicine

## 2015-04-23 DIAGNOSIS — M1990 Unspecified osteoarthritis, unspecified site: Secondary | ICD-10-CM | POA: Diagnosis not present

## 2015-04-23 DIAGNOSIS — Z720 Tobacco use: Secondary | ICD-10-CM | POA: Diagnosis not present

## 2015-04-23 DIAGNOSIS — G8929 Other chronic pain: Secondary | ICD-10-CM

## 2015-04-23 DIAGNOSIS — J449 Chronic obstructive pulmonary disease, unspecified: Secondary | ICD-10-CM | POA: Insufficient documentation

## 2015-04-23 DIAGNOSIS — Z79899 Other long term (current) drug therapy: Secondary | ICD-10-CM | POA: Diagnosis not present

## 2015-04-23 DIAGNOSIS — H538 Other visual disturbances: Secondary | ICD-10-CM | POA: Insufficient documentation

## 2015-04-23 DIAGNOSIS — R51 Headache: Secondary | ICD-10-CM | POA: Insufficient documentation

## 2015-04-23 DIAGNOSIS — R42 Dizziness and giddiness: Secondary | ICD-10-CM | POA: Diagnosis not present

## 2015-04-23 DIAGNOSIS — M545 Low back pain: Secondary | ICD-10-CM | POA: Diagnosis not present

## 2015-04-23 DIAGNOSIS — R52 Pain, unspecified: Secondary | ICD-10-CM | POA: Diagnosis present

## 2015-04-23 DIAGNOSIS — I10 Essential (primary) hypertension: Secondary | ICD-10-CM | POA: Insufficient documentation

## 2015-04-23 HISTORY — DX: Other symptoms and signs involving the musculoskeletal system: R29.898

## 2015-04-23 HISTORY — DX: Other amnesia: R41.3

## 2015-04-23 LAB — COMPREHENSIVE METABOLIC PANEL
ALK PHOS: 121 U/L (ref 38–126)
ALT: 17 U/L (ref 14–54)
AST: 17 U/L (ref 15–41)
Albumin: 3.4 g/dL — ABNORMAL LOW (ref 3.5–5.0)
Anion gap: 10 (ref 5–15)
BUN: 11 mg/dL (ref 6–20)
CO2: 26 mmol/L (ref 22–32)
Calcium: 8.5 mg/dL — ABNORMAL LOW (ref 8.9–10.3)
Chloride: 101 mmol/L (ref 101–111)
Creatinine, Ser: 0.6 mg/dL (ref 0.44–1.00)
GFR calc non Af Amer: >60 mL/min (ref >60)
Glucose, Bld: 96 mg/dL (ref 65–99)
Potassium: 3.8 mmol/L (ref 3.5–5.1)
SODIUM: 137 mmol/L (ref 135–145)
Total Bilirubin: 0.5 mg/dL (ref 0.3–1.2)
Total Protein: 7.1 g/dL (ref 6.5–8.1)

## 2015-04-23 LAB — CBC WITH DIFFERENTIAL/PLATELET
BASOS PCT: 0 % (ref 0–1)
Basophils Absolute: 0.1 10*3/uL (ref 0.0–0.1)
EOS ABS: 0.6 10*3/uL (ref 0.0–0.7)
Eosinophils Relative: 5 % (ref 0–5)
HEMATOCRIT: 47.4 % — AB (ref 36.0–46.0)
Hemoglobin: 15.8 g/dL — ABNORMAL HIGH (ref 12.0–15.0)
Lymphocytes Relative: 48 % — ABNORMAL HIGH (ref 12–46)
Lymphs Abs: 6.7 10*3/uL — ABNORMAL HIGH (ref 0.7–4.0)
MCH: 30.2 pg (ref 26.0–34.0)
MCHC: 33.3 g/dL (ref 30.0–36.0)
MCV: 90.6 fL (ref 78.0–100.0)
MONO ABS: 0.9 10*3/uL (ref 0.1–1.0)
Monocytes Relative: 6 % (ref 3–12)
NEUTROS ABS: 5.8 10*3/uL (ref 1.7–7.7)
Neutrophils Relative %: 41 % — ABNORMAL LOW (ref 43–77)
Platelets: 304 10*3/uL (ref 150–400)
RBC: 5.23 MIL/uL — AB (ref 3.87–5.11)
RDW: 13.8 % (ref 11.5–15.5)
WBC: 14 10*3/uL — ABNORMAL HIGH (ref 4.0–10.5)

## 2015-04-23 LAB — URINALYSIS, ROUTINE W REFLEX MICROSCOPIC
Bilirubin Urine: NEGATIVE
Glucose, UA: NEGATIVE mg/dL
Ketones, ur: NEGATIVE mg/dL
LEUKOCYTES UA: NEGATIVE
Nitrite: NEGATIVE
PROTEIN: NEGATIVE mg/dL
Urobilinogen, UA: 0.2 mg/dL (ref 0.0–1.0)
pH: 6 (ref 5.0–8.0)

## 2015-04-23 LAB — URINE MICROSCOPIC-ADD ON

## 2015-04-23 LAB — TROPONIN I: Troponin I: <0.03 ng/mL (ref <0.031)

## 2015-04-23 MED ORDER — DIPHENHYDRAMINE HCL 50 MG/ML IJ SOLN
25.0000 mg | Freq: Once | INTRAMUSCULAR | Status: AC
  Administered 2015-04-23: 25 mg via INTRAVENOUS
  Filled 2015-04-23: qty 1

## 2015-04-23 MED ORDER — METOCLOPRAMIDE HCL 5 MG/ML IJ SOLN
10.0000 mg | Freq: Once | INTRAMUSCULAR | Status: AC
  Administered 2015-04-23: 10 mg via INTRAVENOUS
  Filled 2015-04-23: qty 2

## 2015-04-23 MED ORDER — HYDROMORPHONE HCL 1 MG/ML IJ SOLN
1.0000 mg | Freq: Once | INTRAMUSCULAR | Status: AC
  Administered 2015-04-23: 1 mg via INTRAVENOUS
  Filled 2015-04-23: qty 1

## 2015-04-23 NOTE — ED Notes (Signed)
Presents with generalized pain patient states that it is chronic pain. uses a cane when walking. Patient did ask for nurse to lock up medication before her daughter arrives. Patient states that her daughter takes her medication at home. Patient states that she locks them up at home

## 2015-04-23 NOTE — ED Notes (Signed)
Patient presents with generalized pain that is chronic and the H/A is getting worse and dizziness continues.

## 2015-04-23 NOTE — Discharge Instructions (Signed)

## 2015-04-23 NOTE — ED Provider Notes (Signed)
CSN: [id number]     Arrival date & time 04/23/15  1729 History   First MD Initiated Contact with Patient 04/23/15 1740     Chief Complaint  Patient presents with  . Pain     (Consider location/radiation/quality/duration/timing/severity/associated sxs/prior Treatment) HPI Comments: Patient presents via EMS with multiple complaints. She has acute on chronic back pain is getting worse over the past several days. She called EMS today because her blood pressure was elevated and she was having a headache and dizziness. She reports gradual onset headache since this morning associated with photophobia and room spinning dizziness that is worse with position change. She denies any focal weakness, numbness or tingling. She denies any bowel or bladder incontinence. She denies any change in her chronic back pain. She reports no focal lower extremity weakness. No numbness or tingling. No bowel or bladder incontinence. No saddle anesthesia. She reports taking hydrocodone for pain as prescribed. Denies missing any medications. No fever, chest pain, shortness of breath, abdominal pain nausea or vomiting.  The history is provided by the patient and the EMS personnel. The history is limited by the condition of the patient.    Past Medical History  Diagnosis Date  . Arthritis   . Bronchitis   . Hypertension   . Chronic back pain   . Chronic knee pain   . COPD (chronic obstructive pulmonary disease)   . Memory difficulty   . Weakness of both legs    Past Surgical History  Procedure Laterality Date  . Knee surgery  2007  . Appendectomy  1971  . Tonsillectomy  1971   Family History  Problem Relation Age of Onset  . Heart failure Father   . Diabetes Father   . Kidney failure Father    History  Substance Use Topics  . Smoking status: Light Tobacco Smoker -- 0.50 packs/day for 10 years    Types: Cigarettes  . Smokeless tobacco: Not on file  . Alcohol Use: 0.0 oz/week    0 Standard drinks or  equivalent per week     Comment: Very rare   OB History    Gravida Para Term Preterm AB TAB SAB Ectopic Multiple Living   Review of Systems  Constitutional: Negative for fever, activity change, appetite change and fatigue.  HENT: Negative for congestion and rhinorrhea.   Eyes: Negative for visual disturbance.  Respiratory: Negative for cough, chest tightness and shortness of breath.   Gastrointestinal: Negative for nausea, vomiting and abdominal pain.  Genitourinary: Negative for dysuria and hematuria.  Musculoskeletal: Positive for myalgias, back pain and arthralgias. Negative for neck pain and neck stiffness.  Skin: Negative for wound.  Neurological: Positive for dizziness, light-headedness and headaches. Negative for weakness and numbness.  A complete 10 system review of systems was obtained and all systems are negative except as noted in the HPI and PMH.      Allergies  Bee venom; Amitriptyline; Mushroom extract complex; Toradol; Tramadol; and Trazodone and nefazodone  Home Medications   Prior to Admission medications   Medication Sig Start Date End Date Taking? Authorizing Provider  albuterol (PROVENTIL HFA;VENTOLIN HFA) 108 (90 BASE) MCG/ACT inhaler Inhale 2 puffs into the lungs every 6 (six) hours as needed for wheezing. 06/21/13  Yes Nimish Normajean Glasgow, MD  cyclobenzaprine (FLEXERIL) 5 MG tablet Take 5 mg by mouth 3 (three) times daily as needed for muscle spasms.  12/13/14  Yes Historical Provider,  MD  diazepam (VALIUM) 5 MG tablet Take 5 mg by mouth 3 (three) times daily as needed for anxiety.   Yes Historical Provider, MD  diphenhydrAMINE (BENADRYL) 25 MG tablet Take 2 tablets (50 mg total) by mouth every 4 (four) hours as needed for itching. 12/17/13  Yes Wayland Salinas, MD  gabapentin (NEURONTIN) 300 MG capsule Take 300 mg by mouth 3 (three) times daily.   Yes Historical Provider, MD  HYDROcodone-acetaminophen (NORCO) 10-325 MG per tablet Take 1 tablet by mouth  every 6 (six) hours as needed for moderate pain or severe pain.   Yes Historical Provider, MD  metFORMIN (GLUCOPHAGE-XR) 500 MG 24 hr tablet Take 500 mg by mouth at bedtime.  12/15/14  Yes Historical Provider, MD  metoprolol succinate (TOPROL-XL) 25 MG 24 hr tablet Take 25 mg by mouth at bedtime.  12/20/14  Yes Historical Provider, MD  VOLTAREN 1 % GEL Apply 4 g topically every 8 (eight) hours.  02/06/15  Yes Historical Provider, MD  DULoxetine (CYMBALTA) 30 MG capsule Take 30 mg by mouth daily. 04/23/15   Historical Provider, MD  EVZIO 0.4 MG/0.4ML SOAJ Inject 0.4 mLs into the muscle once as needed (for Anaphylaxis/allergic reaction).  02/19/15   Historical Provider, MD   BP 161/72 mmHg  Pulse 55  Temp(Src) 97.1 F (36.2 C) (Oral)  Resp 20  Ht 5\' 5"  (1.651 m)  Wt 225 lb (102.059 kg)  BMI 37.44 kg/m2  SpO2 97% Physical Exam  Constitutional: She is oriented to person, place, and time. She appears well-developed and well-nourished. No distress.  Photophobic, washcloth over her eyes.  HENT:  Head: Normocephalic and atraumatic.  Mouth/Throat: Oropharynx is clear and moist. No oropharyngeal exudate.  Eyes: Conjunctivae and EOM are normal. Pupils are equal, round, and reactive to light.  Neck: Normal range of motion. Neck supple.  No meningismus.  Cardiovascular: Normal rate, regular rhythm, normal heart sounds and intact distal pulses.   No murmur heard. Pulmonary/Chest: Effort normal and breath sounds normal. No respiratory distress.  Abdominal: Soft. There is no tenderness. There is no rebound and no guarding.  Musculoskeletal: Normal range of motion. She exhibits tenderness. She exhibits no edema.  Tenderness to the paraspinal lumbar region. No step-offs. Pain with range of motion of legs.  4/5 strength in bilateral lower extremities with poor effort. Intact DP and PT pulses. Sensation intact to light touch. Great toe extension intact bilaterally. Unable to elicit patellar reflexes  bilaterally Antalgic gait  Neurological: She is alert and oriented to person, place, and time. No cranial nerve deficit. She exhibits normal muscle tone. Coordination normal.  No ataxia on finger to nose bilaterally. No pronator drift. CN 2-12 intact. Equal grip strength. Sensation intact. No nystagmus  Skin: Skin is warm.  Flat affect  Psychiatric: She has a normal mood and affect. Her behavior is normal.  Nursing note and vitals reviewed.   ED Course  Procedures (including critical care time) Labs Review Labs Reviewed  CBC WITH DIFFERENTIAL/PLATELET - Abnormal; Notable for the following:    WBC 14.0 (*)    RBC 5.23 (*)    Hemoglobin 15.8 (*)    HCT 47.4 (*)    Neutrophils Relative % 41 (*)    Lymphocytes Relative 48 (*)    Lymphs Abs 6.7 (*)    All other components within normal limits  COMPREHENSIVE METABOLIC PANEL - Abnormal; Notable for the following:    Calcium 8.5 (*)    Albumin 3.4 (*)    All other  components within normal limits  URINALYSIS, ROUTINE W REFLEX MICROSCOPIC (NOT AT [facility]) - Abnormal; Notable for the following:    Specific Gravity, Urine <1.005 (*)    Hgb urine dipstick TRACE (*)    All other components within normal limits  URINE MICROSCOPIC-ADD ON - Abnormal; Notable for the following:    Squamous Epithelial / LPF FEW (*)    All other components within normal limits  TROPONIN I    Imaging Review Ct Head Wo Contrast  04/23/2015   CLINICAL DATA:  64 year old female with headache all over since 8 a.m. this morning. Some associated nausea. History of diabetes mellitus.  EXAM: CT HEAD WITHOUT CONTRAST  TECHNIQUE: Contiguous axial images were obtained from the base of the skull through the vertex without intravenous contrast.  COMPARISON:  Head CT 01/21/2015.  FINDINGS: Patchy and confluent areas of decreased attenuation are noted throughout the deep and periventricular white matter of the cerebral hemispheres bilaterally, compatible with chronic microvascular  ischemic disease. No acute intracranial abnormalities. Specifically, no evidence of acute intracranial hemorrhage, no definite findings of acute/subacute cerebral ischemia, no mass, mass effect, hydrocephalus or abnormal intra or extra-axial fluid collections. Visualized paranasal sinuses and mastoids are well pneumatized, with exception of some mild multifocal mucosal thickening in the right maxillary and ethmoid sinuses, and small amount of dependent fluid in the left sphenoid sinus. No acute displaced skull fractures are identified.  IMPRESSION: 1. No acute intracranial abnormalities. 2. Mild chronic microvascular ischemic changes in the cerebral white matter. 3. Mild paranasal sinus disease, as above.   Electronically Signed   By: Trudie Reed M.D.   On: 04/23/2015 19:16     EKG Interpretation   Date/Time:  Monday April 23 2015 18:14:36 EDT Ventricular Rate:  55 PR Interval:  140 QRS Duration: 83 QT Interval:  451 QTC Calculation: 431 R Axis:   58 Text Interpretation:  Sinus rhythm No significant change was found  Confirmed by Manus Gunning  MD, Carmine Carrozza [id number]) on 04/23/2015 6:19:19 PM      MDM   Final diagnoses:  Chronic pain  acute on chronic back pain presenting with headache as well as dizziness. She does have lower extremity pain and poor effort on exam. Nothing to suggest cord compression or cauda equina. No fever, incontinence, focal weakness.  Has dizziness and gradual onset headache with photophobia. No nystagmus on exam.  MRI May 31 shows severe lumbar spinal stenosis. CT head negative.  Patient states primary reason for calling EMS was that she had a headache and her blood pressure was high and her daughter became concerned.  BP acceptable in the ED. Headache improved with treatment.  No suggestion of CVA or TIA on exam.  She feels her back pain is at baseline. She denies any new weakness, numbness, tingling, incontinence. She gives a poor effort on focal neuro testing as  documented on previous visits. Labs show chronic leukocytosis. Patient is able to ambulate. She is anxious that she "needs to get home to get to a birthday party".  She states her headache and dizziness have resolved.  Her back pain is "what I always have".  Ambulatory in the hallway.  Encouraged followup with PCP and her neurosurgeon Dr. Jordan Likes.  Glynn Octave, MD 04/24/15 [phone]

## 2015-04-23 NOTE — ED Notes (Signed)
Patient was able to ambulate in hallway with assistance.

## 2015-04-27 ENCOUNTER — Emergency Department (HOSPITAL_COMMUNITY)
Admission: EM | Admit: 2015-04-27 | Discharge: 2015-04-27 | Disposition: A | Discharge status: Home / Self Care | Payer: Medicare Other | Attending: Emergency Medicine | Admitting: Emergency Medicine

## 2015-04-27 ENCOUNTER — Emergency Department (HOSPITAL_COMMUNITY): Payer: Medicare Other

## 2015-04-27 ENCOUNTER — Encounter (HOSPITAL_COMMUNITY): Payer: Self-pay

## 2015-04-27 DIAGNOSIS — Z8739 Personal history of other diseases of the musculoskeletal system and connective tissue: Secondary | ICD-10-CM | POA: Insufficient documentation

## 2015-04-27 DIAGNOSIS — M79604 Pain in right leg: Secondary | ICD-10-CM | POA: Diagnosis present

## 2015-04-27 DIAGNOSIS — R079 Chest pain, unspecified: Secondary | ICD-10-CM | POA: Insufficient documentation

## 2015-04-27 DIAGNOSIS — J441 Chronic obstructive pulmonary disease with (acute) exacerbation: Secondary | ICD-10-CM | POA: Insufficient documentation

## 2015-04-27 DIAGNOSIS — Z720 Tobacco use: Secondary | ICD-10-CM | POA: Diagnosis not present

## 2015-04-27 DIAGNOSIS — Z9889 Other specified postprocedural states: Secondary | ICD-10-CM | POA: Insufficient documentation

## 2015-04-27 DIAGNOSIS — Z79899 Other long term (current) drug therapy: Secondary | ICD-10-CM | POA: Diagnosis not present

## 2015-04-27 DIAGNOSIS — R110 Nausea: Secondary | ICD-10-CM | POA: Diagnosis not present

## 2015-04-27 DIAGNOSIS — G8929 Other chronic pain: Secondary | ICD-10-CM | POA: Insufficient documentation

## 2015-04-27 DIAGNOSIS — M62838 Other muscle spasm: Secondary | ICD-10-CM | POA: Insufficient documentation

## 2015-04-27 DIAGNOSIS — I10 Essential (primary) hypertension: Secondary | ICD-10-CM | POA: Diagnosis not present

## 2015-04-27 LAB — COMPREHENSIVE METABOLIC PANEL
ALT: 15 U/L (ref 14–54)
ANION GAP: 10 (ref 5–15)
AST: 16 U/L (ref 15–41)
Albumin: 3.6 g/dL (ref 3.5–5.0)
Alkaline Phosphatase: 113 U/L (ref 38–126)
BUN: 13 mg/dL (ref 6–20)
CALCIUM: 8.8 mg/dL — AB (ref 8.9–10.3)
CO2: 26 mmol/L (ref 22–32)
Chloride: 102 mmol/L (ref 101–111)
Creatinine, Ser: 0.68 mg/dL (ref 0.44–1.00)
GFR calc non Af Amer: >60 mL/min (ref >60)
GLUCOSE: 67 mg/dL (ref 65–99)
Potassium: 4 mmol/L (ref 3.5–5.1)
Sodium: 138 mmol/L (ref 135–145)
Total Bilirubin: 0.4 mg/dL (ref 0.3–1.2)
Total Protein: 7 g/dL (ref 6.5–8.1)

## 2015-04-27 LAB — CBC WITH DIFFERENTIAL/PLATELET
BASOS ABS: 0.1 10*3/uL (ref 0.0–0.1)
BASOS PCT: 1 % (ref 0–1)
EOS ABS: 0.5 10*3/uL (ref 0.0–0.7)
Eosinophils Relative: 4 % (ref 0–5)
HCT: 46.5 % — ABNORMAL HIGH (ref 36.0–46.0)
Hemoglobin: 15.3 g/dL — ABNORMAL HIGH (ref 12.0–15.0)
LYMPHS ABS: 6.2 10*3/uL — AB (ref 0.7–4.0)
Lymphocytes Relative: 40 % (ref 12–46)
MCH: 29.9 pg (ref 26.0–34.0)
MCHC: 32.9 g/dL (ref 30.0–36.0)
MCV: 91 fL (ref 78.0–100.0)
MONOS PCT: 9 % (ref 3–12)
Monocytes Absolute: 1.4 10*3/uL — ABNORMAL HIGH (ref 0.1–1.0)
NEUTROS ABS: 7.2 10*3/uL (ref 1.7–7.7)
NEUTROS PCT: 47 % (ref 43–77)
PLATELETS: 318 10*3/uL (ref 150–400)
RBC: 5.11 MIL/uL (ref 3.87–5.11)
RDW: 13.8 % (ref 11.5–15.5)
WBC: 15.4 10*3/uL — ABNORMAL HIGH (ref 4.0–10.5)

## 2015-04-27 LAB — TROPONIN I: Troponin I: <0.03 ng/mL (ref <0.031)

## 2015-04-27 MED ORDER — DIAZEPAM 5 MG PO TABS
5.0000 mg | ORAL_TABLET | Freq: Once | ORAL | Status: AC
  Administered 2015-04-27: 5 mg via ORAL
  Filled 2015-04-27: qty 1

## 2015-04-27 MED ORDER — ONDANSETRON 8 MG PO TBDP
8.0000 mg | ORAL_TABLET | Freq: Once | ORAL | Status: AC
  Administered 2015-04-27: 8 mg via ORAL
  Filled 2015-04-27: qty 1

## 2015-04-27 MED ORDER — CYCLOBENZAPRINE HCL 10 MG PO TABS
10.0000 mg | ORAL_TABLET | Freq: Once | ORAL | Status: AC
  Administered 2015-04-27: 10 mg via ORAL
  Filled 2015-04-27: qty 1

## 2015-04-27 MED ORDER — HYDROMORPHONE HCL 1 MG/ML IJ SOLN
INTRAMUSCULAR | Status: AC
  Administered 2015-04-27: 1 mg via INTRAMUSCULAR
  Filled 2015-04-27: qty 1

## 2015-04-27 MED ORDER — HYDROMORPHONE HCL 1 MG/ML IJ SOLN
1.0000 mg | Freq: Once | INTRAMUSCULAR | Status: AC
  Administered 2015-04-27: 1 mg via INTRAMUSCULAR

## 2015-04-27 NOTE — ED Provider Notes (Signed)
CSN: [id number]     Arrival date & time 04/27/15  1136 History  This chart was scribed for No att. providers found by Marica Otter, ED Scribe. This patient was seen in room APA08/APA08 and the patient's care was started at 12:29 PM.   Chief Complaint  Patient presents with  . Leg Pain  . Chest Pain    Patient is a 64 y.o. female presenting with chest pain. The history is provided by the patient. No language interpreter was used.  Chest Pain Associated symptoms: shortness of breath   Associated symptoms: no fever    PCP: [name], MD HPI Comments: LIALA SEALES is a 64 y.o. female, with PMHx noted below including chronic knee pain, who presents to the Emergency Department complaining of chronic, atraumatic, sudden onset, sharp, shooting, cramping ("like big knots"), 10/10 right leg pain radiating to the back of the knee onset today while pt was riding a scooter at [employer]. Pt reports prior, similar Sx were effectively treated with steroid shots flexeril. Pt notes she takes flexeril regularly and her last dose was at 6AM this morning.    Pt also complains of atraumatic, sudden onset, "hollow feeling", 8/10 chest pain with associated SOB onset en route to the ED. Pt further notes that since last night she has been sweating more than usual. Pt denies vomiting but reports she is nauseas. Pt denies any other Sx at this time.   Past Medical History  Diagnosis Date  . Arthritis   . Bronchitis   . Hypertension   . Chronic back pain   . Chronic knee pain   . COPD (chronic obstructive pulmonary disease)   . Memory difficulty   . Weakness of both legs    Past Surgical History  Procedure Laterality Date  . Knee surgery  2007  . Appendectomy  1971  . Tonsillectomy  1971   Family History  Problem Relation Age of Onset  . Heart failure Father   . Diabetes Father   . Kidney failure Father    History  Substance Use Topics  . Smoking status: Light Tobacco Smoker -- 0.50 packs/day for  10 years    Types: Cigarettes  . Smokeless tobacco: Not on file  . Alcohol Use: 0.0 oz/week    0 Standard drinks or equivalent per week     Comment: Very rare   OB History    Gravida Para Term Preterm AB TAB SAB Ectopic Multiple Living   2 2 2             Review of Systems  Constitutional: Negative for fever and chills.  Respiratory: Positive for shortness of breath.   Cardiovascular: Positive for chest pain.  Musculoskeletal: Positive for myalgias (right leg pain ).    A complete 10 system review of systems was obtained and all systems are negative except as noted in the HPI and PMH.    Allergies  Bee venom; Amitriptyline; Mushroom extract complex; Toradol; Tramadol; and Trazodone and nefazodone  Home Medications   Prior to Admission medications   Medication Sig Start Date End Date Taking? Authorizing Provider  albuterol (PROVENTIL HFA;VENTOLIN HFA) 108 (90 BASE) MCG/ACT inhaler Inhale 2 puffs into the lungs every 6 (six) hours as needed for wheezing. 06/21/13  Yes Nimish Normajean Glasgow, MD  cyclobenzaprine (FLEXERIL) 5 MG tablet Take 5 mg by mouth 3 (three) times daily.  12/13/14  Yes Historical Provider, MD  diazepam (VALIUM) 5 MG tablet Take 5 mg by mouth 3 (three) times  daily as needed for anxiety.   Yes Historical Provider, MD  diphenhydrAMINE (BENADRYL) 25 MG tablet Take 2 tablets (50 mg total) by mouth every 4 (four) hours as needed for itching. 12/17/13  Yes Wayland Salinas, MD  DULoxetine (CYMBALTA) 30 MG capsule Take 30 mg by mouth daily. 04/23/15  Yes Historical Provider, MD  EVZIO 0.4 MG/0.4ML SOAJ Inject 0.4 mLs into the muscle once as needed (for Anaphylaxis/allergic reaction).  02/19/15  Yes Historical Provider, MD  gabapentin (NEURONTIN) 300 MG capsule Take 300 mg by mouth 3 (three) times daily.   Yes Historical Provider, MD  HYDROcodone-acetaminophen (NORCO) 10-325 MG per tablet Take 1 tablet by mouth every 6 (six) hours as needed for moderate pain or severe pain.   Yes Historical  Provider, MD  metFORMIN (GLUCOPHAGE-XR) 500 MG 24 hr tablet Take 500 mg by mouth at bedtime.  12/15/14  Yes Historical Provider, MD  metoprolol succinate (TOPROL-XL) 25 MG 24 hr tablet Take 25 mg by mouth at bedtime.  12/20/14  Yes Historical Provider, MD  VOLTAREN 1 % GEL Apply 4 g topically every 8 (eight) hours.  02/06/15  Yes Historical Provider, MD   Triage Vitals: BP 127/67 mmHg  Pulse 62  Temp(Src) 98 F (36.7 C) (Oral)  Resp 17  Ht  (1.626 m)  Wt 225 lb (102.059 kg)  BMI 38.60 kg/m2  SpO2 95% Physical Exam  Constitutional: She is oriented to person, place, and time. She appears well-developed and well-nourished.  HENT:  Head: Normocephalic and atraumatic.  Eyes: Conjunctivae and EOM are normal. Pupils are equal, round, and reactive to light.  Neck: Normal range of motion. Neck supple.  Cardiovascular: Normal rate and regular rhythm.   Pulmonary/Chest: Effort normal and breath sounds normal.  Abdominal: Soft. Bowel sounds are normal.  Musculoskeletal: Normal range of motion. She exhibits tenderness.  Tenderness: Proximal lateral right thigh, right popliteal area, right posterior thigh   Neurological: She is alert and oriented to person, place, and time.  Skin: Skin is warm and dry.  Psychiatric: She has a normal mood and affect. Her behavior is normal.  Nursing note and vitals reviewed.   ED Course  Procedures (including critical care time) DIAGNOSTIC STUDIES: Oxygen Saturation is 95% on RA, adequate by my interpretation.    COORDINATION OF CARE: 12:36 PM-Discussed treatment plan with pt at bedside and pt agreed to plan.   Labs Review Labs Reviewed  COMPREHENSIVE METABOLIC PANEL - Abnormal; Notable for the following:    Calcium 8.8 (*)    All other components within normal limits  CBC WITH DIFFERENTIAL/PLATELET - Abnormal; Notable for the following:    WBC 15.4 (*)    Hemoglobin 15.3 (*)    HCT 46.5 (*)    Lymphs Abs 6.2 (*)    Monocytes Absolute 1.4 (*)     All other components within normal limits  TROPONIN I    Imaging Review Dg Chest 2 View  04/27/2015   CLINICAL DATA:  Chest pain.  Cramps in both legs.  Unable to stand.  EXAM: CHEST  2 VIEW  COMPARISON:  06/20/2013  FINDINGS: Heart size is within normal limits. The lungs are free of focal consolidations and pleural effusions. There is mild mid thoracic spondylosis.  IMPRESSION: No evidence for acute cardiopulmonary abnormality.   Electronically Signed   By: Norva Pavlov M.D.   On: 04/27/2015 13:44     EKG Interpretation   Date/Time:  Friday April 27 2015 11:48:48 EDT Ventricular Rate:  65 PR  Interval:  131 QRS Duration: 80 QT Interval:  388 QTC Calculation: 403 R Axis:   55 Text Interpretation:  Sinus rhythm Low voltage, precordial leads  Borderline repolarization abnormality No significant change since last  tracing Confirmed by Bebe Shaggy  MD, Dorinda Hill ([id number]) on 04/27/2015 12:01:12 PM      MDM   Final diagnoses:  Muscle spasm of right lower extremity   History and physical suggestive of muscle spasms in her right lower extremity. EKG, hemoglobin, troponin all normal.  Patient feels better after pain medication. I personally performed the services described in this documentation, which was scribed in my presence. The recorded information has been reviewed and is accurate.     Donnetta Hutching, MD 04/28/15 [phone]

## 2015-04-27 NOTE — ED Notes (Signed)
Pt up to bedside commode

## 2015-04-27 NOTE — Discharge Instructions (Signed)
Tests were good. Recommend hydration and gentle exercise to loosen your muscles. Follow-up your primary care doctor

## 2015-04-27 NOTE — ED Notes (Signed)
Pt reports has chronic pain in r leg from knee surgery and while at [employer] today riding a scooter, had sudden onset of worsening r leg pain that radiates up and down leg.  On the way to the hospital, pt started having chest pain and nausea.  Reports chest pain worse with breathing.  Pt describes pain as a sharp shooting pain that radiates to left arm.

## 2015-04-30 ENCOUNTER — Ambulatory Visit (INDEPENDENT_AMBULATORY_CARE_PROVIDER_SITE_OTHER): Payer: Medicare Other | Admitting: Diagnostic Neuroimaging

## 2015-04-30 ENCOUNTER — Encounter: Payer: Self-pay | Admitting: Diagnostic Neuroimaging

## 2015-04-30 VITALS — BP 154/69 | HR 60 | Ht 64.0 in | Wt 225.0 lb

## 2015-04-30 DIAGNOSIS — F32A Depression, unspecified: Secondary | ICD-10-CM

## 2015-04-30 DIAGNOSIS — M179 Osteoarthritis of knee, unspecified: Secondary | ICD-10-CM

## 2015-04-30 DIAGNOSIS — F6811 Factitious disorder with predominantly psychological signs and symptoms: Secondary | ICD-10-CM

## 2015-04-30 DIAGNOSIS — F329 Major depressive disorder, single episode, unspecified: Secondary | ICD-10-CM

## 2015-04-30 DIAGNOSIS — IMO0002 Reserved for concepts with insufficient information to code with codable children: Secondary | ICD-10-CM

## 2015-04-30 DIAGNOSIS — M1710 Unilateral primary osteoarthritis, unspecified knee: Secondary | ICD-10-CM

## 2015-04-30 DIAGNOSIS — R4189 Other symptoms and signs involving cognitive functions and awareness: Secondary | ICD-10-CM

## 2015-04-30 NOTE — Progress Notes (Signed)
[facility]  PATIENT: Carla [name] DOB: January 02, 1951  REFERRING CLINICIAN: Bluth HISTORY FROM: patient and daughter  REASON FOR VISIT: FOLLOW UP   HISTORICAL  CHIEF COMPLAINT:  Chief Complaint  Patient presents with  . Memory Loss    rm 7 , daughter - Carla [name], "dizziness, pain, memory worse"  . Follow-up    MMSE 19    HISTORY OF PRESENT ILLNESS:   UPDATE 04/30/15: Since last visit, memory, HA, dizziness are worse. Now seeing pain mgmt, psychiatry, psychology.  PRIOR HPI (12/27/14): 64 year old female with arthritis, chronic pain, hypertension, COPD, tobacco abuse, here for evaluation of memory loss, forgetfulness, confusion events. Review of chart demonstrates multiple hospital evaluations in the past with associated significant psychosocial and situational stress, family members with substance abuse, as well as stressful events 2 years ago with death of patient's estranged brother and her husband. Patient denies any significant memory problems. She states that other people complain that she has forgetfulness confusion and memory problems. Patient's daughter is in the room today. She describes several episodes where the patient mistakes her grandson for her deceased estranged brother. Patient and her daughter show me several photographs of patients grandson, and the patient states that the person in the picture is her deceased brother. Apparently patient has been pointing to empty chairs and stating that her deceased brother is sitting there and questioning why he is in the house. There have been other episodes were patient stares off, speaks slowly, moves slowly, and does not recognize her own family members. This may last for a few minutes or a few hours at a time. During this evaluation patient is tearful for the majority of evaluation. She denies depression. She states that she is sad about her deceased husband. She has never seen psychiatry or psychologist. She takes  diazepam for anxiety. Patient does report stress related to grandchildren who live in the home who do not listen to her or help her. At times she feels helpless. Patient has significant chronic pain, knee pain, back pain, and had to go on to disability from her job in 2008 as a result of these pain issues. Patient was value for memory problems by PCP in October 2015. She was started on donepezil which caused diarrhea. She was switched to [facility] 2 which also cause diarrhea. She stopped these medications.   REVIEW OF SYSTEMS: Full 14 system review of systems performed and notable only for memory loss confusion slurred speech dizziness anxiety not asleep hallucinations insomnia joint pain joint swelling cramps achy muscles sparing sensation blurred vision double vision.  ALLERGIES: Allergies  Allergen Reactions  . Bee Venom Swelling  . Amitriptyline Other (See Comments)    Has nightmares when using  . Mushroom Extract Complex     Extreme sweating, vomiting  . Toradol [Ketorolac Tromethamine] Nausea And Vomiting  . Tramadol Nausea And Vomiting  . Trazodone And Nefazodone Nausea And Vomiting    HOME MEDICATIONS: Outpatient Prescriptions Prior to Visit  Medication Sig Dispense Refill  . albuterol (PROVENTIL HFA;VENTOLIN HFA) 108 (90 BASE) MCG/ACT inhaler Inhale 2 puffs into the lungs every 6 (six) hours as needed for wheezing. 1 Inhaler 2  . cyclobenzaprine (FLEXERIL) 5 MG tablet Take 5 mg by mouth 3 (three) times daily.     . diazepam (VALIUM) 5 MG tablet Take 5 mg by mouth 3 (three) times daily as needed for anxiety.    . diphenhydrAMINE (BENADRYL) 25 MG tablet Take 2 tablets (50 mg total) by mouth every 4 (  four) hours as needed for itching. 20 tablet 0  . DULoxetine (CYMBALTA) 30 MG capsule Take 30 mg by mouth daily.    Marland Kitchen EVZIO 0.4 MG/0.4ML SOAJ Inject 0.4 mLs into the muscle once as needed (for Anaphylaxis/allergic reaction).     . gabapentin (NEURONTIN) 300 MG capsule Take 300 mg by mouth 3  (three) times daily.    Marland Kitchen HYDROcodone-acetaminophen (NORCO) 10-325 MG per tablet Take 1 tablet by mouth every 6 (six) hours as needed for moderate pain or severe pain.    . metFORMIN (GLUCOPHAGE-XR) 500 MG 24 hr tablet Take 500 mg by mouth at bedtime.     . metoprolol succinate (TOPROL-XL) 25 MG 24 hr tablet Take 25 mg by mouth at bedtime.     . VOLTAREN 1 % GEL Apply 4 g topically every 8 (eight) hours.      No facility-administered medications prior to visit.    PAST MEDICAL HISTORY: Past Medical History  Diagnosis Date  . Arthritis   . Bronchitis   . Hypertension   . Chronic back pain   . Chronic knee pain   . COPD (chronic obstructive pulmonary disease)   . Memory difficulty   . Weakness of both legs     PAST SURGICAL HISTORY: Past Surgical History  Procedure Laterality Date  . Knee surgery  2007  . Appendectomy  1971  . Tonsillectomy  1971    FAMILY HISTORY: Family History  Problem Relation Age of Onset  . Heart failure Father   . Diabetes Father   . Kidney failure Father     SOCIAL HISTORY:  History   Social History  . Marital Status: Widowed    Spouse Name: N/A  . Number of Children: 2  . Years of Education: 12   Occupational History  . Retired    Social History Main Topics  . Smoking status: Light Tobacco Smoker -- 0.50 packs/day for 10 years    Types: Cigarettes  . Smokeless tobacco: Not on file     Comment: 04/30/15 less than 1 PPD  . Alcohol Use: 0.0 oz/week    0 Standard drinks or equivalent per week     Comment: Very rare  . Drug Use: No  . Sexual Activity: No   Other Topics Concern  . Not on file   Social History Narrative   Lives at home with your 2 daughters, son-in-law, 4 grandkids and more   Right and left handed   Caffeine use: very rare .Has coke 2 times a week.        PHYSICAL EXAM  Filed Vitals:   04/30/15 1312  BP: 154/69  Pulse: 60  Height: 5\' 4"  (1.626 m)  Weight: 225 lb (102.059 kg)    Body mass index is 38.6  kg/(m^2).  No exam data present  MMSE - Mini Mental State Exam 04/30/2015  Orientation to time 3  Orientation to Place 2  Registration 3  Attention/ Calculation 2  Recall 1  Language- name 2 objects 2  Language- repeat 1  Language- follow 3 step command 2  Language- read & follow direction 1  Write a sentence 1  Copy design 1  Total score 19    GENERAL EXAM: Patient is in no distress; well developed, nourished and groomed; neck is supple  CARDIOVASCULAR: Regular rate and rhythm, no murmurs, no carotid bruits  NEUROLOGIC: MENTAL STATUS: awake, alert, oriented to person, place and time, recent and remote memory intact, normal attention and concentration, language fluent, comprehension  intact, naming intact, fund of knowledge appropriate; SLOW RESPONSES. NO FRONTAL RELEASE SIGNS. CRANIAL NERVE: no papilledema on fundoscopic exam, pupils equal and reactive to light, visual fields full to confrontation, extraocular muscles intact, no nystagmus, facial sensation and strength symmetric, hearing intact, palate elevates symmetrically, uvula midline, shoulder shrug symmetric, tongue midline. MOTOR: normal bulk and tone, full strength in the BUE; LIMITED STRENGTH IN BLE (2) DUE TO PAIN SENSORY: normal and symmetric to light touch, temperature, vibration COORDINATION: finger-nose-finger, fine finger movements normal REFLEXES: deep tendon reflexes TRACE and symmetric; KNEES TENDER GAIT/STATION: USES WALKER; SLOW TO RISE; UNSTEADY; ANTALGIC GAIT     DIAGNOSTIC DATA (LABS, IMAGING, TESTING) - I reviewed patient records, labs, notes, testing and imaging myself where available.  Lab Results  Component Value Date   WBC 15.4* 04/27/2015   HGB 15.3* 04/27/2015   HCT 46.5* 04/27/2015   MCV 91.0 04/27/2015   PLT 318 04/27/2015      Component Value Date/Time   NA 138 04/27/2015 1313   K 4.0 04/27/2015 1313   CL 102 04/27/2015 1313   CO2 26 04/27/2015 1313   GLUCOSE 67 04/27/2015 1313    BUN 13 04/27/2015 1313   CREATININE 0.68 04/27/2015 1313   CALCIUM 8.8* 04/27/2015 1313   PROT 7.0 04/27/2015 1313   ALBUMIN 3.6 04/27/2015 1313   AST 16 04/27/2015 1313   ALT 15 04/27/2015 1313   ALKPHOS 113 04/27/2015 1313   BILITOT 0.4 04/27/2015 1313   GFRNONAA >60 04/27/2015 1313   GFRAA >60 04/27/2015 1313   No results found for: CHOL, HDL, LDLCALC, LDLDIRECT, TRIG, CHOLHDL Lab Results  Component Value Date   HGBA1C 6.8* 06/20/2013   No results found for: QIONGEXB28 Lab Results  Component Value Date   TSH 7.694* 06/20/2013   I reviewed images myself and agree with interpretation. -VRP  09/13/14 CT head - no acute findings; normal brain  03/13/15 MRI lumbar spine  1. Severe spinal stenosis at L4-5 due to mild disc degeneration and severe posterior element hypertrophy.  2. Mild multifactorial spinal stenosis at L2-3.  04/23/15 CT head  1. No acute intracranial abnormalities. 2. Mild chronic microvascular ischemic changes in the cerebral white matter. 3. Mild paranasal sinus disease, as above.     ASSESSMENT AND PLAN  64 y.o. year old female here with significant psychosocial and situational stress, prolonged grieving from death of her estranged brother as well as husband approximately 2 years ago, chronic pain, anxiety, with intermittent and progressive episodes of short-term memory problems, delusions, hallucinations. MMSE testing by PCP showed 25/30 in Oct 2015 and 27/30 in Feb 2016. Neurologic examination and CT of the head are unremarkable. 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  Ddx: pseudo-dementia, chronic pain, depression  PLAN: I spent 15 minutes of face to face time with patient. Greater than 50% of time was spent in counseling and coordination of care with patient. In summary we discussed: - continue pain mgmt and psychiatry/psychology treatments   Return if  symptoms worsen or fail to improve, for return to PCP.      Suanne Marker, MD 04/30/2015, 1:38 PM Certified in Neurology, Neurophysiology and Neuroimaging  [facility] [address] [place], [place] [address] [phone]

## 2015-05-03 ENCOUNTER — Encounter (HOSPITAL_COMMUNITY): Payer: Self-pay

## 2015-05-03 ENCOUNTER — Emergency Department (HOSPITAL_COMMUNITY): Payer: Medicare Other

## 2015-05-03 ENCOUNTER — Emergency Department (HOSPITAL_COMMUNITY)
Admission: EM | Admit: 2015-05-03 | Discharge: 2015-05-03 | Disposition: A | Discharge status: Home / Self Care | Payer: Medicare Other | Attending: Emergency Medicine | Admitting: Emergency Medicine

## 2015-05-03 DIAGNOSIS — J449 Chronic obstructive pulmonary disease, unspecified: Secondary | ICD-10-CM | POA: Diagnosis not present

## 2015-05-03 DIAGNOSIS — Z79899 Other long term (current) drug therapy: Secondary | ICD-10-CM | POA: Diagnosis not present

## 2015-05-03 DIAGNOSIS — M1990 Unspecified osteoarthritis, unspecified site: Secondary | ICD-10-CM | POA: Insufficient documentation

## 2015-05-03 DIAGNOSIS — T148 Other injury of unspecified body region: Secondary | ICD-10-CM | POA: Insufficient documentation

## 2015-05-03 DIAGNOSIS — Y998 Other external cause status: Secondary | ICD-10-CM | POA: Insufficient documentation

## 2015-05-03 DIAGNOSIS — S4991XA Unspecified injury of right shoulder and upper arm, initial encounter: Secondary | ICD-10-CM | POA: Diagnosis not present

## 2015-05-03 DIAGNOSIS — S59901A Unspecified injury of right elbow, initial encounter: Secondary | ICD-10-CM | POA: Diagnosis not present

## 2015-05-03 DIAGNOSIS — G8929 Other chronic pain: Secondary | ICD-10-CM | POA: Insufficient documentation

## 2015-05-03 DIAGNOSIS — S8991XA Unspecified injury of right lower leg, initial encounter: Secondary | ICD-10-CM | POA: Diagnosis present

## 2015-05-03 DIAGNOSIS — Y92009 Unspecified place in unspecified non-institutional (private) residence as the place of occurrence of the external cause: Secondary | ICD-10-CM | POA: Insufficient documentation

## 2015-05-03 DIAGNOSIS — Z720 Tobacco use: Secondary | ICD-10-CM | POA: Insufficient documentation

## 2015-05-03 DIAGNOSIS — W109XXA Fall (on) (from) unspecified stairs and steps, initial encounter: Secondary | ICD-10-CM | POA: Diagnosis not present

## 2015-05-03 DIAGNOSIS — Y9339 Activity, other involving climbing, rappelling and jumping off: Secondary | ICD-10-CM | POA: Insufficient documentation

## 2015-05-03 DIAGNOSIS — T07XXXA Unspecified multiple injuries, initial encounter: Secondary | ICD-10-CM

## 2015-05-03 DIAGNOSIS — I10 Essential (primary) hypertension: Secondary | ICD-10-CM | POA: Diagnosis not present

## 2015-05-03 DIAGNOSIS — T1490XA Injury, unspecified, initial encounter: Secondary | ICD-10-CM

## 2015-05-03 MED ORDER — ONDANSETRON HCL 4 MG/2ML IJ SOLN
4.0000 mg | Freq: Once | INTRAMUSCULAR | Status: AC
  Administered 2015-05-03: 4 mg via INTRAVENOUS

## 2015-05-03 MED ORDER — HYDROMORPHONE HCL 1 MG/ML IJ SOLN
0.5000 mg | Freq: Once | INTRAMUSCULAR | Status: AC
  Administered 2015-05-03: 0.5 mg via INTRAVENOUS
  Filled 2015-05-03: qty 1

## 2015-05-03 MED ORDER — NAPROXEN 500 MG PO TABS: 500.0000 mg | ORAL_TABLET | Freq: Two times a day (BID) | ORAL | Status: DC

## 2015-05-03 MED ORDER — HYDROCODONE-ACETAMINOPHEN 5-325 MG PO TABS
2.0000 | ORAL_TABLET | Freq: Once | ORAL | Status: AC
  Administered 2015-05-03: 2 via ORAL
  Filled 2015-05-03: qty 2

## 2015-05-03 MED ORDER — ONDANSETRON HCL 4 MG/2ML IJ SOLN
4.0000 mg | Freq: Once | INTRAMUSCULAR | Status: DC
  Filled 2015-05-03: qty 2

## 2015-05-03 NOTE — ED Notes (Signed)
Patient's daughter came to nurses station stating "can I go ahead and get her home medicines so I can take them to the car." Advised daughter that patient requested medication to be left at nurses station and would be given back to patient at patient's request. Daughter returned to patient's room.

## 2015-05-03 NOTE — ED Notes (Signed)
Patient requesting to be given her home medications to her in room 18 at this time. Daughter at bedside. Bag of home medications returned to patient at this time.

## 2015-05-03 NOTE — ED Notes (Signed)
Patient assisted to restroom with use of wheelchair. Patient yelled out when moving. Does not try to help with ambulation. Patients states that everything hurts. Daughter at bedside, feels that patient should not be up going to restroom or with any movement.  Stated to mother if you fall then they will be getting you up out of the floor.

## 2015-05-03 NOTE — ED Provider Notes (Signed)
CSN: [id number]     Arrival date & time 05/03/15  2005 History   First MD Initiated Contact with Patient 05/03/15 2006     Chief Complaint  Patient presents with  . Fall     (Consider location/radiation/quality/duration/timing/severity/associated sxs/prior Treatment) HPI Comments: The patient is a morbidly obese 64 year old female who presents after having a fall at home. She states that she was climbing up the stairs when she fell forward, slid down the stairs injuring her right knee and her right arm from the shoulder through the forearm. This was acute in onset, pain is persistent, worse with palpation, no obvious deformity, no head injury and neck pain. Paramedics transported the patient with a cervical collar and a backboard. No medications given prior to arrival. The patient denies any fevers chills nausea vomiting diarrhea abdominal pain chest pain or shortness of breath.  Patient is a 64 y.o. female presenting with fall. The history is provided by the patient.  Fall    Past Medical History  Diagnosis Date  . Arthritis   . Bronchitis   . Hypertension   . Chronic back pain   . Chronic knee pain   . COPD (chronic obstructive pulmonary disease)   . Memory difficulty   . Weakness of both legs    Past Surgical History  Procedure Laterality Date  . Knee surgery  2007  . Appendectomy  1971  . Tonsillectomy  1971   Family History  Problem Relation Age of Onset  . Heart failure Father   . Diabetes Father   . Kidney failure Father    History  Substance Use Topics  . Smoking status: Light Tobacco Smoker -- 0.50 packs/day for 10 years    Types: Cigarettes  . Smokeless tobacco: Not on file     Comment: 04/30/15 less than 1 PPD  . Alcohol Use: 0.0 oz/week    0 Standard drinks or equivalent per week     Comment: Very rare   OB History    Gravida Para Term Preterm AB TAB SAB Ectopic Multiple Living   Review of Systems  All other systems reviewed and are  negative.     Allergies  Bee venom; Amitriptyline; Mushroom extract complex; Toradol; Tramadol; and Trazodone and nefazodone  Home Medications   Prior to Admission medications   Medication Sig Start Date End Date Taking? Authorizing Provider  cyclobenzaprine (FLEXERIL) 5 MG tablet Take 5 mg by mouth 3 (three) times daily.  12/13/14  Yes Historical Provider, MD  diazepam (VALIUM) 5 MG tablet Take 5 mg by mouth 3 (three) times daily as needed for anxiety.   Yes Historical Provider, MD  DULoxetine (CYMBALTA) 30 MG capsule Take 30 mg by mouth daily. 04/23/15  Yes Historical Provider, MD  gabapentin (NEURONTIN) 300 MG capsule Take 300 mg by mouth 3 (three) times daily.   Yes Historical Provider, MD  HYDROcodone-acetaminophen (NORCO) 10-325 MG per tablet Take 1 tablet by mouth every 6 (six) hours as needed for moderate pain or severe pain.   Yes Historical Provider, MD  metFORMIN (GLUCOPHAGE-XR) 500 MG 24 hr tablet Take 500 mg by mouth at bedtime.  12/15/14  Yes Historical Provider, MD  metoprolol succinate (TOPROL-XL) 25 MG 24 hr tablet Take 25 mg by mouth at bedtime.  12/20/14  Yes Historical Provider, MD  VOLTAREN 1 % GEL Apply 4 g topically every 8 (eight) hours.  02/06/15  Yes Historical Provider,  MD  albuterol (PROVENTIL HFA;VENTOLIN HFA) 108 (90 BASE) MCG/ACT inhaler Inhale 2 puffs into the lungs every 6 (six) hours as needed for wheezing. 06/21/13   Nimish Normajean Glasgow, MD  diphenhydrAMINE (BENADRYL) 25 MG tablet Take 2 tablets (50 mg total) by mouth every 4 (four) hours as needed for itching. 12/17/13   Wayland Salinas, MD  EVZIO 0.4 MG/0.4ML SOAJ Inject 0.4 mLs into the muscle once as needed (for Anaphylaxis/allergic reaction).  02/19/15   Historical Provider, MD  naproxen (NAPROSYN) 500 MG tablet Take 1 tablet (500 mg total) by mouth 2 (two) times daily with a meal. 05/03/15   Eber Hong, MD   BP 170/69 mmHg  Pulse 89  Temp(Src) 97.6 F (36.4 C) (Oral)  Resp 20  Ht 5\' 4"  (1.626 m)  Wt 225 lb (102.059  kg)  BMI 38.60 kg/m2  SpO2 100% Physical Exam  Constitutional: She appears well-developed and well-nourished. No distress.  HENT:  Head: Normocephalic and atraumatic.  Mouth/Throat: Oropharynx is clear and moist. No oropharyngeal exudate.  Eyes: Conjunctivae and EOM are normal. Pupils are equal, round, and reactive to light. Right eye exhibits no discharge. Left eye exhibits no discharge. No scleral icterus.  Neck: Normal range of motion. Neck supple. No JVD present. No thyromegaly present.  Cardiovascular: Normal rate, regular rhythm, normal heart sounds and intact distal pulses.  Exam reveals no gallop and no friction rub.   No murmur heard. Pulmonary/Chest: Effort normal and breath sounds normal. No respiratory distress. She has no wheezes. She has no rales.  Abdominal: Soft. Bowel sounds are normal. She exhibits no distension and no mass. There is no tenderness.  Musculoskeletal: Normal range of motion. She exhibits tenderness (tender to palpation over the right shoulder and right elbow, supple joints, soft compartments. Tenderness over the right knee, able to straight leg raise bilaterally with pain.). She exhibits no edema.  Lymphadenopathy:    She has no cervical adenopathy.  Neurological: She is alert. Coordination normal.  Skin: Skin is warm and dry. No rash noted. No erythema.  Psychiatric: She has a normal mood and affect. Her behavior is normal.  Nursing note and vitals reviewed.   ED Course  Procedures (including critical care time) Labs Review Labs Reviewed - No data to display  Imaging Review Dg Shoulder Right  05/03/2015   CLINICAL DATA:  Post fall, now with right shoulder pain  EXAM: RIGHT SHOULDER - 2+ VIEW  COMPARISON:  Right humerus radiographs -08/02/2010  FINDINGS: No fracture or dislocation. Suspected mild degenerative change of the [facility] joint with joint space loss and inferiorly directed osteophytosis. The glenohumeral joint space is suboptimally evaluated due to  obliquity. No evidence of calcific tendinitis. Regional soft tissues appear normal.  IMPRESSION: 1. No definite fracture or dislocation 2. Suspected mild degenerative change of the right AC joint.   Electronically Signed   By: Simonne Come M.D.   On: 05/03/2015 21:34   Dg Elbow 2 Views Right  05/03/2015   CLINICAL DATA:  Trauma  EXAM: RIGHT ELBOW - 2 VIEW  COMPARISON:  None.  FINDINGS: Two views of the right elbow submitted. No acute fracture or subluxation. No radiopaque foreign body.  IMPRESSION: Negative.   Electronically Signed   By: Natasha Mead M.D.   On: 05/03/2015 21:30   Dg Forearm Right  05/03/2015   CLINICAL DATA:  Trauma  EXAM: RIGHT FOREARM - 2 VIEW  COMPARISON:  None.  FINDINGS: There is no evidence of fracture or other focal bone lesions.  Soft tissues are unremarkable.  IMPRESSION: Negative.   Electronically Signed   By: Natasha Mead M.D.   On: 05/03/2015 21:31   Dg Knee Complete 4 Views Right  05/03/2015   CLINICAL DATA:  Post fall, now with right knee pain.  EXAM: RIGHT KNEE - COMPLETE 4+ VIEW  COMPARISON:  01/11/2015  FINDINGS: The lateral radiograph is degraded due to obliquity. No fracture or dislocation. Mild degenerative change of the knee, likely worse within the medial compartment with joint space loss, subchondral sclerosis and osteophytosis. There is spurring the tibial spines. There is a suspected loose body within the posterior aspect of the lateral compartment of the knee, similar to the 12/2014 examination though again, a discrete donor site is not identified. No definite joint effusion. No radiopaque foreign body.  IMPRESSION: 1. No definite acute findings. 2. Mild degenerative change of the knee, worse within the medial compartment. 3. Suspected loose body within the posterior aspect of the lateral compartment of the knee similar to the 12/2014 examination   Electronically Signed   By: Simonne Come M.D.   On: 05/03/2015 21:29     EKG Interpretation None      MDM   Final  diagnoses:  Trauma  Contusion of multiple sites    The patient has chronic weakness and pain in her bilateral knees, other than that she appears to be at baseline. We'll obtain x-rays of the knee, shoulder elbow and forearm of the right, pain medication ordered. Taken off the backboard, no cervical immobilization.  X-rays reviewed, I personally view the images, I agree with the radiologist interpretation. No signs of fracture, advanced arthritis in multiple joints, patient informed of results, stable for discharge. She is in a pain contract for narcotic medications, opiates will not be prescribed, Naprosyn ordered.  Meds given in ED:  Medications  HYDROcodone-acetaminophen (NORCO/VICODIN) 5-325 MG per tablet 2 tablet (2 tablets Oral Given 05/03/15 2041)  HYDROmorphone (DILAUDID) injection 0.5 mg (0.5 mg Intravenous Given 05/03/15 2218)  ondansetron (ZOFRAN) injection 4 mg (4 mg Intravenous Given 05/03/15 2224)    New Prescriptions   NAPROXEN (NAPROSYN) 500 MG TABLET    Take 1 tablet (500 mg total) by mouth 2 (two) times daily with a meal.      Eber Hong, MD 05/03/15 2228

## 2015-05-03 NOTE — ED Notes (Signed)
Patient stated she wanted [place] to keep her home medication at the nurses desk because her daughter was coming to the hospital and she was afraid her daughter would take her medication. Medication placed in patient's belonging bag and placed at charge desk.

## 2015-05-03 NOTE — Discharge Instructions (Signed)
Your xrays are normal - please use the medicine as prescribed - return to the ER for worsening symptoms.  Please obtain all of your results from medical records or have your doctors office obtain the results - share them with your doctor - you should be seen at your doctors office in the next 2 days. Call today to arrange your follow up. Take the medications as prescribed. Please review all of the medicines and only take them if you do not have an allergy to them. Please be aware that if you are taking birth control pills, taking other prescriptions, ESPECIALLY ANTIBIOTICS may make the birth control ineffective - if this is the case, either do not engage in sexual activity or use alternative methods of birth control such as condoms until you have finished the medicine and your family doctor says it is OK to restart them. If you are on a blood thinner such as COUMADIN, be aware that any other medicine that you take may cause the coumadin to either work too much, or not enough - you should have your coumadin level rechecked in next 7 days if this is the case.  ?  It is also a possibility that you have an allergic reaction to any of the medicines that you have been prescribed - Everybody reacts differently to medications and while MOST people have no trouble with most medicines, you may have a reaction such as nausea, vomiting, rash, swelling, shortness of breath. If this is the case, please stop taking the medicine immediately and contact your physician.  ?  You should return to the ER if you develop severe or worsening symptoms.

## 2015-05-03 NOTE — ED Notes (Signed)
Pt in by ems after a fall tonight.  Pt reports pain all over.

## 2015-05-16 ENCOUNTER — Emergency Department (HOSPITAL_COMMUNITY)
Admission: EM | Admit: 2015-05-16 | Discharge: 2015-05-16 | Disposition: A | Discharge status: Home / Self Care | Payer: Medicare Other | Attending: Emergency Medicine | Admitting: Emergency Medicine

## 2015-05-16 ENCOUNTER — Encounter (HOSPITAL_COMMUNITY): Payer: Self-pay | Admitting: Emergency Medicine

## 2015-05-16 DIAGNOSIS — J449 Chronic obstructive pulmonary disease, unspecified: Secondary | ICD-10-CM | POA: Diagnosis not present

## 2015-05-16 DIAGNOSIS — M1990 Unspecified osteoarthritis, unspecified site: Secondary | ICD-10-CM | POA: Diagnosis not present

## 2015-05-16 DIAGNOSIS — Z9889 Other specified postprocedural states: Secondary | ICD-10-CM | POA: Diagnosis not present

## 2015-05-16 DIAGNOSIS — M25561 Pain in right knee: Secondary | ICD-10-CM | POA: Diagnosis present

## 2015-05-16 DIAGNOSIS — G8929 Other chronic pain: Secondary | ICD-10-CM | POA: Diagnosis not present

## 2015-05-16 DIAGNOSIS — M545 Low back pain: Secondary | ICD-10-CM | POA: Diagnosis not present

## 2015-05-16 DIAGNOSIS — Z720 Tobacco use: Secondary | ICD-10-CM | POA: Insufficient documentation

## 2015-05-16 DIAGNOSIS — M25562 Pain in left knee: Secondary | ICD-10-CM | POA: Insufficient documentation

## 2015-05-16 DIAGNOSIS — Z79899 Other long term (current) drug therapy: Secondary | ICD-10-CM | POA: Insufficient documentation

## 2015-05-16 DIAGNOSIS — I10 Essential (primary) hypertension: Secondary | ICD-10-CM | POA: Insufficient documentation

## 2015-05-16 MED ORDER — OXYCODONE-ACETAMINOPHEN 5-325 MG PO TABS
2.0000 | ORAL_TABLET | Freq: Once | ORAL | Status: AC
  Administered 2015-05-16: 2 via ORAL
  Filled 2015-05-16: qty 2

## 2015-05-16 MED ORDER — ONDANSETRON 8 MG PO TBDP
8.0000 mg | ORAL_TABLET | Freq: Once | ORAL | Status: AC
  Administered 2015-05-16: 8 mg via ORAL
  Filled 2015-05-16: qty 1

## 2015-05-16 MED ORDER — HYDROMORPHONE HCL 1 MG/ML IJ SOLN
1.0000 mg | Freq: Once | INTRAMUSCULAR | Status: AC
  Administered 2015-05-16: 1 mg via INTRAMUSCULAR
  Filled 2015-05-16: qty 1

## 2015-05-16 NOTE — ED Notes (Signed)
Pt reports chronic bilateral knee pain flare-up. Pt denies any known injury.

## 2015-05-16 NOTE — ED Provider Notes (Signed)
CSN: [id number]     Arrival date & time 05/16/15  1630 History   First MD Initiated Contact with Patient 05/16/15 1650     Chief Complaint  Patient presents with  . Knee Pain     (Consider location/radiation/quality/duration/timing/severity/associated sxs/prior Treatment) The history is provided by the patient.   Carla [name] is a 64 y.o. female with a history of chronic arthritis pain issues involving her bilateral knees and low back presenting with worsened bilateral knee pain which she states is probably secondary to weather changes (recent thunderstorms), and missing her 1 pm dose of her percocet and her 3 pm tx with her diclofenac gel as she spent the last 2 days spending significant time sitting in a car as her with daughter ran errands.  She was unable to get out of the car once home secondary to pain, so ems was called and she was transported here.  She is under the care of her pcp and also [facility], last saw them 2 days ago and is planned repeat visit in 6 days for a lumbar steroid injection.  She denies any new injury at this time.   Past Medical History  Diagnosis Date  . Arthritis   . Bronchitis   . Hypertension   . Chronic back pain   . Chronic knee pain   . COPD (chronic obstructive pulmonary disease)   . Memory difficulty   . Weakness of both legs    Past Surgical History  Procedure Laterality Date  . Knee surgery  2007  . Appendectomy  1971  . Tonsillectomy  1971   Family History  Problem Relation Age of Onset  . Heart failure Father   . Diabetes Father   . Kidney failure Father    History  Substance Use Topics  . Smoking status: Light Tobacco Smoker -- 0.50 packs/day for 10 years    Types: Cigarettes  . Smokeless tobacco: Not on file     Comment: 04/30/15 less than 1 PPD  . Alcohol Use: 0.0 oz/week    0 Standard drinks or equivalent per week     Comment: Very rare   OB History    Gravida Para Term Preterm AB TAB SAB Ectopic Multiple  Living   2 2 2             Review of Systems  Constitutional: Negative for fever.  Musculoskeletal: Positive for back pain and arthralgias. Negative for myalgias and joint swelling.  Neurological: Negative for weakness and numbness.      Allergies  Bee venom; Amitriptyline; Mushroom extract complex; Toradol; Tramadol; and Trazodone and nefazodone  Home Medications   Prior to Admission medications   Medication Sig Start Date End Date Taking? Authorizing Provider  albuterol (PROVENTIL HFA;VENTOLIN HFA) 108 (90 BASE) MCG/ACT inhaler Inhale 2 puffs into the lungs every 6 (six) hours as needed for wheezing. 06/21/13  Yes Nimish Normajean Glasgow, MD  cyclobenzaprine (FLEXERIL) 5 MG tablet Take 10 mg by mouth 3 (three) times daily.  12/13/14  Yes Historical Provider, MD  diazepam (VALIUM) 5 MG tablet Take 5 mg by mouth 3 (three) times daily as needed for anxiety.   Yes Historical Provider, MD  diphenhydrAMINE (BENADRYL) 25 MG tablet Take 2 tablets (50 mg total) by mouth every 4 (four) hours as needed for itching. 12/17/13  Yes Wayland Salinas, MD  DULoxetine (CYMBALTA) 30 MG capsule Take 30 mg by mouth at bedtime.  04/23/15  Yes Historical Provider, MD  EVZIO 0.4 MG/0.4ML  SOAJ Inject 0.4 mLs into the muscle once as needed (for Anaphylaxis/allergic reaction).  02/19/15  Yes Historical Provider, MD  gabapentin (NEURONTIN) 300 MG capsule Take 300 mg by mouth 3 (three) times daily.   Yes Historical Provider, MD  HYDROcodone-acetaminophen (NORCO) 10-325 MG per tablet Take 1 tablet by mouth every 6 (six) hours as needed for moderate pain or severe pain.   Yes Historical Provider, MD  metFORMIN (GLUCOPHAGE-XR) 500 MG 24 hr tablet Take 500 mg by mouth at bedtime.  12/15/14  Yes Historical Provider, MD  metoprolol succinate (TOPROL-XL) 25 MG 24 hr tablet Take 25 mg by mouth at bedtime.  12/20/14  Yes Historical Provider, MD  VOLTAREN 1 % GEL Apply 4 g topically every 8 (eight) hours.  02/06/15  Yes Historical Provider, MD   naproxen (NAPROSYN) 500 MG tablet Take 1 tablet (500 mg total) by mouth 2 (two) times daily with a meal. Patient not taking: Reported on 05/16/2015 05/03/15   Eber Hong, MD   BP 165/52 mmHg  Pulse 59  Temp(Src) 97.9 F (36.6 C) (Oral)  Resp 16  Ht  (1.626 m)  Wt 220 lb (99.791 kg)  BMI 37.74 kg/m2  SpO2 98% Physical Exam  Constitutional: She appears well-developed and well-nourished.  HENT:  Head: Atraumatic.  Neck: Normal range of motion.  Cardiovascular:  Pulses equal bilaterally  Musculoskeletal: She exhibits tenderness. She exhibits no edema.       Right knee: She exhibits decreased range of motion and bony tenderness. She exhibits no effusion, no deformity, no erythema, no LCL laxity and no MCL laxity.       Left knee: She exhibits decreased range of motion and bony tenderness. She exhibits no effusion, no deformity, no erythema, no LCL laxity and no MCL laxity.  Neurological: She is alert. She has normal strength. She displays normal reflexes. No sensory deficit.  Skin: Skin is warm and dry.  Psychiatric: She has a normal mood and affect.    ED Course  Procedures (including critical care time) Labs Review Labs Reviewed - No data to display  Imaging Review No results found.   EKG Interpretation None      MDM   Final diagnoses:  Bilateral chronic knee pain    Acute on chronic bilateral knee pain without trauma, inadequate pain control secondary to noncompliance today with her pain specialists regimen.  She was given dilaudid 1 mg IM with minimal improvement in pain.  She was given her oxycodone dose and was then able to ambulate and weight bear although still uncomfortable.  She was advised to maintain her home regimen and to use her voltaren gel as soon as she is home.  F/u with pcp or pain specialist as needed.  Prn f/u anticipated.  The patient appears reasonably screened and/or stabilized for discharge and I doubt any other medical condition or other [facility]  requiring further screening, evaluation, or treatment in the ED at this time prior to discharge.     Burgess Amor, PA-C 05/17/15 1444  Glynn Octave, MD 05/17/15 1500

## 2015-05-16 NOTE — Discharge Instructions (Signed)

## 2015-05-16 NOTE — ED Notes (Signed)
Flair of chronic bilateral knee pain.  States she takes 1- 10/325 Percocet q 6 hours, last dose at 0700.  She also took her prescribed dose of Ativan and Flexeril.  Has not used her Diclofenac gel today as she "wasn't home at 1500 when it was due." Pain is at central patella bilaterally and described as "fireworks going off under my kneecaps".

## 2015-06-04 ENCOUNTER — Emergency Department (HOSPITAL_COMMUNITY): Payer: Medicare Other

## 2015-06-04 ENCOUNTER — Encounter (HOSPITAL_COMMUNITY): Payer: Self-pay | Admitting: Emergency Medicine

## 2015-06-04 ENCOUNTER — Emergency Department (HOSPITAL_COMMUNITY)
Admission: EM | Admit: 2015-06-04 | Discharge: 2015-06-04 | Disposition: A | Discharge status: Home / Self Care | Payer: Medicare Other | Attending: Emergency Medicine | Admitting: Emergency Medicine

## 2015-06-04 DIAGNOSIS — I10 Essential (primary) hypertension: Secondary | ICD-10-CM | POA: Insufficient documentation

## 2015-06-04 DIAGNOSIS — Y9289 Other specified places as the place of occurrence of the external cause: Secondary | ICD-10-CM | POA: Insufficient documentation

## 2015-06-04 DIAGNOSIS — Z79899 Other long term (current) drug therapy: Secondary | ICD-10-CM | POA: Insufficient documentation

## 2015-06-04 DIAGNOSIS — J449 Chronic obstructive pulmonary disease, unspecified: Secondary | ICD-10-CM | POA: Diagnosis not present

## 2015-06-04 DIAGNOSIS — M25561 Pain in right knee: Secondary | ICD-10-CM

## 2015-06-04 DIAGNOSIS — W19XXXA Unspecified fall, initial encounter: Secondary | ICD-10-CM

## 2015-06-04 DIAGNOSIS — Y9389 Activity, other specified: Secondary | ICD-10-CM | POA: Diagnosis not present

## 2015-06-04 DIAGNOSIS — G8929 Other chronic pain: Secondary | ICD-10-CM | POA: Insufficient documentation

## 2015-06-04 DIAGNOSIS — S8991XA Unspecified injury of right lower leg, initial encounter: Secondary | ICD-10-CM | POA: Diagnosis not present

## 2015-06-04 DIAGNOSIS — M1990 Unspecified osteoarthritis, unspecified site: Secondary | ICD-10-CM | POA: Diagnosis not present

## 2015-06-04 DIAGNOSIS — S8992XA Unspecified injury of left lower leg, initial encounter: Secondary | ICD-10-CM | POA: Insufficient documentation

## 2015-06-04 DIAGNOSIS — Z720 Tobacco use: Secondary | ICD-10-CM | POA: Insufficient documentation

## 2015-06-04 DIAGNOSIS — Y998 Other external cause status: Secondary | ICD-10-CM | POA: Insufficient documentation

## 2015-06-04 DIAGNOSIS — W182XXA Fall in (into) shower or empty bathtub, initial encounter: Secondary | ICD-10-CM | POA: Insufficient documentation

## 2015-06-04 DIAGNOSIS — M25562 Pain in left knee: Secondary | ICD-10-CM

## 2015-06-04 LAB — I-STAT CHEM 8, ED
BUN: 21 mg/dL — ABNORMAL HIGH (ref 6–20)
CHLORIDE: 98 mmol/L — AB (ref 101–111)
CREATININE: 0.7 mg/dL (ref 0.44–1.00)
Calcium, Ion: 1.14 mmol/L (ref 1.13–1.30)
Glucose, Bld: 103 mg/dL — ABNORMAL HIGH (ref 65–99)
HCT: 58 % — ABNORMAL HIGH (ref 36.0–46.0)
Hemoglobin: 19.7 g/dL — ABNORMAL HIGH (ref 12.0–15.0)
POTASSIUM: 4.2 mmol/L (ref 3.5–5.1)
SODIUM: 135 mmol/L (ref 135–145)
TCO2: 26 mmol/L (ref 0–100)

## 2015-06-04 MED ORDER — METHOCARBAMOL 1000 MG/10ML IJ SOLN
INTRAMUSCULAR | Status: AC
  Filled 2015-06-04: qty 10

## 2015-06-04 MED ORDER — HEPARIN (PORCINE) IN NACL 100-0.45 UNIT/ML-% IJ SOLN: 1250.0000 [IU]/h | INTRAMUSCULAR | Status: DC

## 2015-06-04 MED ORDER — METHOCARBAMOL 1000 MG/10ML IJ SOLN
1000.0000 mg | Freq: Once | INTRAVENOUS | Status: AC
  Administered 2015-06-04: 1000 mg via INTRAVENOUS
  Filled 2015-06-04: qty 10

## 2015-06-04 MED ORDER — IOHEXOL 350 MG/ML SOLN
150.0000 mL | Freq: Once | INTRAVENOUS | Status: AC | PRN
  Administered 2015-06-04: 150 mL via INTRAVENOUS

## 2015-06-04 MED ORDER — HEPARIN BOLUS VIA INFUSION: 4000.0000 [IU] | Freq: Once | INTRAVENOUS | Status: DC

## 2015-06-04 MED ORDER — OXYCODONE-ACETAMINOPHEN 5-325 MG PO TABS
2.0000 | ORAL_TABLET | Freq: Once | ORAL | Status: AC
  Administered 2015-06-04: 2 via ORAL
  Filled 2015-06-04: qty 2

## 2015-06-04 MED ORDER — HYDROMORPHONE HCL 1 MG/ML IJ SOLN
1.0000 mg | Freq: Once | INTRAMUSCULAR | Status: AC
  Administered 2015-06-04: 1 mg via INTRAMUSCULAR
  Filled 2015-06-04: qty 1

## 2015-06-04 MED ORDER — ONDANSETRON HCL 4 MG/2ML IJ SOLN
4.0000 mg | Freq: Once | INTRAMUSCULAR | Status: AC
  Administered 2015-06-04: 4 mg via INTRAVENOUS
  Filled 2015-06-04: qty 2

## 2015-06-04 NOTE — ED Notes (Signed)
Attempted to ambulate pt. Pt only able to take a few steps with assistance. Pt very unsteady on her feet. Pt states she wants to go home, she has plenty of help there. EDP aware

## 2015-06-04 NOTE — ED Notes (Signed)
Patient ambulated to bathroom and back.

## 2015-06-04 NOTE — ED Notes (Signed)
Patient returned from XR. 

## 2015-06-04 NOTE — Progress Notes (Signed)
Pharmacy:  Heparin for lower extremity ischemia 64 yo lady, not on anticoagulation prior to admission Dosing weight :  78 kg Will give 4000 unit bolus and drip at 1250 units/hr Check heparin level in 8 hours Monitor for bleeding complications  Talbert Cage,  PharmD

## 2015-06-04 NOTE — Discharge Instructions (Signed)

## 2015-06-04 NOTE — ED Notes (Signed)
Patient given a Coke per MD approval.

## 2015-06-04 NOTE — ED Notes (Signed)
Per EMS called out to patient's house by daughter. Patient states that she was on her knees for 30 to 45 minutes. States her knees gave out in the shower and fell to knees. States history of knee pain and problems. States normally walks with walker at home.

## 2015-06-04 NOTE — ED Provider Notes (Signed)
CSN: [id number]     Arrival date & time 06/04/15  1300 History   First MD Initiated Contact with Patient 06/04/15 1330     Chief Complaint  Patient presents with  . Fall  . Knee Pain     (Consider location/radiation/quality/duration/timing/severity/associated sxs/prior Treatment) HPI Comments: Patient with chronic arthritis issues and pain issues involving her knees and back. EMS was called out as patient was getting out of the shower and her knees "got weak" and she fell to the ground. Did not hit her head or lose consciousness. Complains of worsening of her chronic pain in her bilateral knees and back. Denies head, neck or upper back pain. Denies chest pain or shortness of breath. Patient takes chronic Percocet for her knee pain did not take any today. She denies any weakness, numbness or tingling. She denies any bowel or bladder incontinence. She denies any fever or vomiting.  Patient is a 64 y.o. female presenting with knee pain. The history is provided by the patient and the EMS personnel. The history is limited by the condition of the patient.  Knee Pain Associated symptoms: back pain   Associated symptoms: no fever and no neck pain     Past Medical History  Diagnosis Date  . Arthritis   . Bronchitis   . Hypertension   . Chronic back pain   . Chronic knee pain   . COPD (chronic obstructive pulmonary disease)   . Memory difficulty   . Weakness of both legs    Past Surgical History  Procedure Laterality Date  . Knee surgery  2007  . Appendectomy  1971  . Tonsillectomy  1971   Family History  Problem Relation Age of Onset  . Heart failure Father   . Diabetes Father   . Kidney failure Father    Social History  Substance Use Topics  . Smoking status: Light Tobacco Smoker -- 0.50 packs/day for 10 years    Types: Cigarettes  . Smokeless tobacco: None     Comment: 04/30/15 less than 1 PPD  . Alcohol Use: 0.0 oz/week    0 Standard drinks or equivalent per week      Comment: Very rare   OB History    Gravida Para Term Preterm AB TAB SAB Ectopic Multiple Living   2 2 2             Review of Systems  Constitutional: Negative for fever, activity change and appetite change.  HENT: Negative for congestion and rhinorrhea.   Respiratory: Negative for chest tightness and shortness of breath.   Cardiovascular: Negative for chest pain and leg swelling.  Gastrointestinal: Negative for nausea, vomiting and abdominal pain.  Genitourinary: Negative for dysuria and hematuria.  Musculoskeletal: Positive for myalgias, back pain and arthralgias. Negative for neck pain.  Skin: Negative for rash.  Neurological: Positive for weakness. Negative for dizziness and headaches.  A complete 10 system review of systems was obtained and all systems are negative except as noted in the HPI and PMH.      Allergies  Bee venom; Amitriptyline; Mushroom extract complex; Toradol; Tramadol; and Trazodone and nefazodone  Home Medications   Prior to Admission medications   Medication Sig Start Date End Date Taking? Authorizing Provider  amLODipine (NORVASC) 2.5 MG tablet Take 2.5 mg by mouth daily.   Yes Historical Provider, MD  cyclobenzaprine (FLEXERIL) 10 MG tablet Take 10 mg by mouth 3 (three) times daily as needed for muscle spasms.   Yes Historical Provider, MD  diazepam (VALIUM) 5 MG tablet Take 5 mg by mouth 3 (three) times daily as needed for anxiety.   Yes Historical Provider, MD  DULoxetine (CYMBALTA) 60 MG capsule Take 60 mg by mouth daily.   Yes Historical Provider, MD  gabapentin (NEURONTIN) 300 MG capsule Take 300 mg by mouth 3 (three) times daily.   Yes Historical Provider, MD  HYDROcodone-acetaminophen (NORCO) 10-325 MG per tablet Take 1 tablet by mouth every 6 (six) hours as needed for moderate pain or severe pain.   Yes Historical Provider, MD  hydrOXYzine (VISTARIL) 25 MG capsule Take 25 mg by mouth at bedtime.   Yes Historical Provider, MD  metFORMIN  (GLUCOPHAGE-XR) 500 MG 24 hr tablet Take 500 mg by mouth at bedtime.  12/15/14  Yes Historical Provider, MD  metoprolol succinate (TOPROL-XL) 50 MG 24 hr tablet Take 50 mg by mouth at bedtime. Take with or immediately following a meal.   Yes Historical Provider, MD  VOLTAREN 1 % GEL Apply 4 g topically every 8 (eight) hours.  02/06/15  Yes Historical Provider, MD  albuterol (PROVENTIL HFA;VENTOLIN HFA) 108 (90 BASE) MCG/ACT inhaler Inhale 2 puffs into the lungs every 6 (six) hours as needed for wheezing. 06/21/13   Nimish Normajean Glasgow, MD  diphenhydrAMINE (BENADRYL) 25 MG tablet Take 2 tablets (50 mg total) by mouth every 4 (four) hours as needed for itching. 12/17/13   Wayland Salinas, MD  EVZIO 0.4 MG/0.4ML SOAJ Inject 0.4 mLs into the muscle once as needed (for Anaphylaxis/allergic reaction).  02/19/15   Historical Provider, MD  naproxen (NAPROSYN) 500 MG tablet Take 1 tablet (500 mg total) by mouth 2 (two) times daily with a meal. Patient not taking: Reported on 05/16/2015 05/03/15   Eber Hong, MD   BP 146/57 mmHg  Pulse 62  Temp(Src) 98.1 F (36.7 C) (Oral)  Resp 18  Ht 5\' 4"  (1.626 m)  Wt 225 lb (102.059 kg)  BMI 38.60 kg/m2  SpO2 92% Physical Exam  Constitutional: She is oriented to person, place, and time. She appears well-developed and well-nourished. No distress.  HENT:  Head: Normocephalic and atraumatic.  Mouth/Throat: Oropharynx is clear and moist. No oropharyngeal exudate.  Eyes: Conjunctivae and EOM are normal. Pupils are equal, round, and reactive to light.  Neck: Normal range of motion. Neck supple.  No C spine tenderness  Cardiovascular: Normal rate, regular rhythm, normal heart sounds and intact distal pulses.   No murmur heard. Pulmonary/Chest: Effort normal and breath sounds normal. No respiratory distress.  Abdominal: Soft. There is no tenderness. There is no rebound and no guarding.  Musculoskeletal: She exhibits tenderness. She exhibits no edema.  Bilateral anterior knee  tenderness, knees kept Flexed Position. No Obvious Deformity. Lumbar spine tenderness without step-off or crepitance. Able to lift legs off bed.  Hesitant to extend knees, poor effort throughout  Neurological: She is alert and oriented to person, place, and time. No cranial nerve deficit. She exhibits normal muscle tone. Coordination normal.  4/5 strength in lower extremities bilaterally with poor effort. Able to flex and extend hips. Knees kept in Flexed Position. Intact DP and PT Pulses with doppler.  Skin: Skin is warm.  Psychiatric: She has a normal mood and affect. Her behavior is normal.  Nursing note and vitals reviewed.   ED Course  Procedures (including critical care time) Labs Review Labs Reviewed  I-STAT CHEM 8, ED - Abnormal; Notable for the following:    Chloride 98 (*)    BUN 21 (*)  Glucose, Bld 103 (*)    Hemoglobin 19.7 (*)    HCT 58.0 (*)    All other components within normal limits  HEPARIN LEVEL (UNFRACTIONATED)  CBC    Imaging Review Dg Lumbar Spine Complete  06/04/2015   CLINICAL DATA:  Acute lower back pain after fall in the shower.  EXAM: LUMBAR SPINE - COMPLETE 4+ VIEW  COMPARISON:  None.  FINDINGS: No fracture or spondylolisthesis is noted. Mild degenerative disc disease is noted at L2-3 and L4-5. Anterior osteophyte formation is noted at these levels as well as L3-4. Posterior facet joints appear normal.  IMPRESSION: Mild multilevel degenerative disc disease. No acute abnormality seen in the lumbar spine.   Electronically Signed   By: Lupita Raider, M.D.   On: 06/04/2015 15:33   Dg Pelvis 1-2 Views  06/04/2015   CLINICAL DATA:  Fall, bilateral knee pain  EXAM: PELVIS - 1-2 VIEW  COMPARISON:  None.  FINDINGS: There is no evidence of pelvic fracture or diastasis. No pelvic bone lesions are seen.  IMPRESSION: Negative.   Electronically Signed   By: Natasha Mead M.D.   On: 06/04/2015 15:28   Ct Knee Right Wo Contrast  06/04/2015   CLINICAL DATA:  Fall onto  knees in shower today with bilateral knee pain. Irregularity along the patella on conventional radiography, raising the possibility of fracture.  EXAM: CT OF THE right KNEE WITHOUT CONTRAST  TECHNIQUE: Multidetector CT imaging of the a right knee was performed according to the standard protocol. Multiplanar CT image reconstructions were also generated.  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 Loss of articular space most notable in the medial compartment but especially in the patellofemoral joint.  Chronically fragmented spurring from the lateral margin of the patella noted, well corticated and not thought to represent an acute fracture. There is essentially full-thickness loss of articular cartilage in the patellofemoral joint.  Posterior to the expected position of the posterior horn lateral meniscus there is a ossific structure on image 22 of series 3 probably representing a chronically fragmented osteophyte. A free osteochondral fragment in the popliteus recess is possible.  No overt knee effusion.  IMPRESSION: 1. Severe tricompartmental spurring with some chronic fragmentation. I do not observe a fracture or knee effusion. I cannot completely exclude a free osteochondral fragment posterolaterally.   Electronically Signed   By: Gaylyn Rong M.D.   On: 06/04/2015 17:00   Ct Angio Ao+bifem W/cm &/or Wo/cm  06/04/2015   CLINICAL DATA:  Law filling of lower legs. Bilateral lower leg pain. Both feet are cold to the touch.  EXAM: CT ANGIOGRAPHY OF ABDOMINAL AORTA WITH ILIOFEMORAL RUNOFF  TECHNIQUE: Multidetector CT imaging of the abdomen, pelvis and lower extremities was performed using the standard protocol during bolus administration of intravenous contrast. Multiplanar CT image reconstructions and MIPs were obtained to evaluate the vascular anatomy.  CONTRAST:  OMNIPAQUE IOHEXOL 350 MG/ML SOLN  COMPARISON:  None.   FINDINGS: Aorta: Normal caliber. No aneurysm or dissection. Infrarenal abdominal aorta demonstrates mild mural thrombus and calcification consistent with atherosclerotic change. No significant stenosis. Focal stenosis demonstrated in the origin of the celiac axis. Vessels distal to stenosis are patent. The abdominal aorta, superior mesenteric artery, single bilateral renal arteries, inferior mesenteric artery, and bilateral iliac, external iliac, and internal iliac arteries are patent. Scattered calcific atherosclerotic changes are demonstrated. Renal nephrograms are symmetrical.  Right Lower Extremity: Atherosclerotic calcification in the right common  femoral artery without evidence of critical stenosis. The right superficial femoral artery is patent although the distal portion demonstrates segmental narrowing due to atherosclerotic change. Popliteal artery demonstrates atherosclerotic changes resulting in mild diffuse narrowing without occlusion. Tibioperoneal trunk is patent and there is two-vessel flow to the right ankle via anterior tibial and and posterior tibial arteries. Peroneal artery is patent but diminishes just above the ankle.  Left Lower Extremity: Atherosclerotic calcification in the common femoral artery and proximal superficial femoral artery without evidence of occlusion. Common femoral, superficial femoral, deep femoral, and popliteal arteries are patent. Focal areas of non critical stenosis are suggested in the mid and distal superficial femoral artery and in the popliteal artery. Tibial peroneal trunk is patent. Three-vessel runoff is demonstrated to the left ankle.  Review of the MIP images confirms the above findings.  Abdomen: Liver, spleen, gallbladder, pancreas, adrenal glands, kidneys, abdominal aorta, inferior vena cava, and retroperitoneal lymph nodes are unremarkable. Stomach, small bowel, and colon are not abnormally distended. No free air or free fluid in the abdomen.  Pelvis: Uterus  and ovaries are not enlarged. No pelvic mass or lymphadenopathy. No free or loculated pelvic fluid collections. Bladder is decompressed. Degenerative changes in the spine. No destructive bone lesions appreciated.  IMPRESSION: Scattered calcific atherosclerotic changes demonstrated with focal areas of non critical narrowing demonstrated in the inferior abdominal aorta, bilateral common femoral arteries, and bilateral superficial femoral and popliteal arteries. No evidence of vascular occlusion. Three-vessel runoff to the left ankle. Three-vessel runoff nearly to the right ankle although the distal peroneal artery diminishes.   Electronically Signed   By: Burman Nieves M.D.   On: 06/04/2015 21:38   Dg Knee Complete 4 Views Left  06/04/2015   CLINICAL DATA:  Acute left knee pain after fall in shower. Initial encounter.  EXAM: LEFT KNEE - COMPLETE 4+ VIEW  COMPARISON:  January 11, 2015.  FINDINGS: There is no evidence of fracture, dislocation, or joint effusion. Mild tricompartmental joint space narrowing is noted with osteophyte formation. Soft tissues are unremarkable.  IMPRESSION: Mild tricompartmental degenerative joint disease. No acute abnormality seen in the left knee.   Electronically Signed   By: Lupita Raider, M.D.   On: 06/04/2015 15:31   Dg Knee Complete 4 Views Right  06/04/2015   CLINICAL DATA:  Fall onto the knee in the shower, pain  EXAM: RIGHT KNEE - COMPLETE 4+ VIEW  COMPARISON:  05/03/2015  FINDINGS: There is no evidence of fracture, dislocation, or joint effusion. There is no evidence of arthropathy or other focal bone abnormality. Soft tissues are unremarkable. Mild tricompartmental degenerative change identified. Minimal irregular trabeculation of the patella.  IMPRESSION: Mild tricompartmental degenerative change. There is minimal irregular trabeculation of the patella. If there is focal point tenderness raising the question of patellar fracture, consider CT for further evaluation.    Electronically Signed   By: Christiana Pellant M.D.   On: 06/04/2015 15:32   I have personally reviewed and evaluated these images and lab results as part of my medical decision-making.   EKG Interpretation None      MDM   Final diagnoses:  None   Patient with fall landing on bilateral knees. Did not hit head or lose consciousness. History of chronic knee pain. States knees got weak and she lowered herself onto her knees. Denies any dizziness, syncope, chest pain or shortness of breath. Denies any change in chronic back pain.   X-ray shows severe degenerative changes possible patellar fracture of  right knee. CT shows no acute fracture of the patella. She gives a poor effort on focal neuro testing as documented on previous visits. Patient able to ambulate.  Around 7 PM it was noted that patient's legs felt cooler and they appeared more mottled than on initial presentation. Distal pulses were difficult to palpate. Doppler was used to find a DP pulse on the right. No pulses were able to be found on the left. Her legs are cool and capillary refill is delayed.  Vascular surgery was contacted. IV heparin was started. Plan for CT angiogram to evaluate for distal occlusion. Discussed with Dr. Arbie Cookey vascular surgery who agrees with CT angiogram with runoff. Does not want to transfer patient at this time. Doppler was replaced. Now DP and PT pulses are dopplerable bilaterally. IV heparin held.  CTA shows 3 vessel runoff bilaterally.  Reviewed with Dr. Arbie Cookey.  He states everything looks open without areas of occlusion. He feels presentation may have been due to spasm or transient hypotension. He feels she can be discharged from a vascular surgery standpoint.  Patient is able to ambulate. She feels her pain is controlled. She has PCP followup and is anxious to go home. Return precautions discussed.  Glynn Octave, MD 06/05/15 0930

## 2015-06-05 ENCOUNTER — Encounter (HOSPITAL_COMMUNITY): Payer: Self-pay | Admitting: Emergency Medicine

## 2015-06-05 ENCOUNTER — Emergency Department (HOSPITAL_COMMUNITY): Payer: Medicare Other

## 2015-06-05 ENCOUNTER — Emergency Department (HOSPITAL_COMMUNITY)
Admission: EM | Admit: 2015-06-05 | Discharge: 2015-06-05 | Disposition: A | Discharge status: Home / Self Care | Payer: Medicare Other | Attending: Emergency Medicine | Admitting: Emergency Medicine

## 2015-06-05 DIAGNOSIS — R42 Dizziness and giddiness: Secondary | ICD-10-CM

## 2015-06-05 DIAGNOSIS — G8929 Other chronic pain: Secondary | ICD-10-CM | POA: Diagnosis not present

## 2015-06-05 DIAGNOSIS — R001 Bradycardia, unspecified: Secondary | ICD-10-CM | POA: Diagnosis not present

## 2015-06-05 DIAGNOSIS — S8011XD Contusion of right lower leg, subsequent encounter: Secondary | ICD-10-CM | POA: Diagnosis not present

## 2015-06-05 DIAGNOSIS — M79662 Pain in left lower leg: Secondary | ICD-10-CM | POA: Insufficient documentation

## 2015-06-05 DIAGNOSIS — M1990 Unspecified osteoarthritis, unspecified site: Secondary | ICD-10-CM | POA: Insufficient documentation

## 2015-06-05 DIAGNOSIS — J449 Chronic obstructive pulmonary disease, unspecified: Secondary | ICD-10-CM | POA: Insufficient documentation

## 2015-06-05 DIAGNOSIS — Z720 Tobacco use: Secondary | ICD-10-CM | POA: Insufficient documentation

## 2015-06-05 DIAGNOSIS — M79661 Pain in right lower leg: Secondary | ICD-10-CM | POA: Diagnosis present

## 2015-06-05 DIAGNOSIS — Z79899 Other long term (current) drug therapy: Secondary | ICD-10-CM | POA: Diagnosis not present

## 2015-06-05 DIAGNOSIS — I10 Essential (primary) hypertension: Secondary | ICD-10-CM | POA: Diagnosis not present

## 2015-06-05 DIAGNOSIS — R110 Nausea: Secondary | ICD-10-CM | POA: Diagnosis not present

## 2015-06-05 DIAGNOSIS — W1839XD Other fall on same level, subsequent encounter: Secondary | ICD-10-CM | POA: Diagnosis not present

## 2015-06-05 LAB — URINALYSIS, ROUTINE W REFLEX MICROSCOPIC
Bilirubin Urine: NEGATIVE
GLUCOSE, UA: NEGATIVE mg/dL
KETONES UR: NEGATIVE mg/dL
Leukocytes, UA: NEGATIVE
Nitrite: NEGATIVE
PH: 5.5 (ref 5.0–8.0)
PROTEIN: NEGATIVE mg/dL
Specific Gravity, Urine: 1.02 (ref 1.005–1.030)
Urobilinogen, UA: 1 mg/dL (ref 0.0–1.0)

## 2015-06-05 LAB — CBC WITH DIFFERENTIAL/PLATELET
Basophils Absolute: 0 10*3/uL (ref 0.0–0.1)
Basophils Relative: 0 % (ref 0–1)
EOS ABS: 0.2 10*3/uL (ref 0.0–0.7)
EOS PCT: 2 % (ref 0–5)
HCT: 49.2 % — ABNORMAL HIGH (ref 36.0–46.0)
Hemoglobin: 16.3 g/dL — ABNORMAL HIGH (ref 12.0–15.0)
LYMPHS ABS: 5.4 10*3/uL — AB (ref 0.7–4.0)
Lymphocytes Relative: 40 % (ref 12–46)
MCH: 30.8 pg (ref 26.0–34.0)
MCHC: 33.1 g/dL (ref 30.0–36.0)
MCV: 93 fL (ref 78.0–100.0)
MONO ABS: 1.1 10*3/uL — AB (ref 0.1–1.0)
MONOS PCT: 8 % (ref 3–12)
Neutro Abs: 6.7 10*3/uL (ref 1.7–7.7)
Neutrophils Relative %: 50 % (ref 43–77)
PLATELETS: 240 10*3/uL (ref 150–400)
RBC: 5.29 MIL/uL — ABNORMAL HIGH (ref 3.87–5.11)
RDW: 14.4 % (ref 11.5–15.5)
WBC: 13.5 10*3/uL — ABNORMAL HIGH (ref 4.0–10.5)

## 2015-06-05 LAB — COMPREHENSIVE METABOLIC PANEL
ALBUMIN: 3.6 g/dL (ref 3.5–5.0)
ALT: 22 U/L (ref 14–54)
AST: 21 U/L (ref 15–41)
Alkaline Phosphatase: 112 U/L (ref 38–126)
Anion gap: 6 (ref 5–15)
BUN: 15 mg/dL (ref 6–20)
CHLORIDE: 99 mmol/L — AB (ref 101–111)
CO2: 30 mmol/L (ref 22–32)
CREATININE: 0.64 mg/dL (ref 0.44–1.00)
Calcium: 9.2 mg/dL (ref 8.9–10.3)
GFR calc Af Amer: >60 mL/min (ref >60)
GLUCOSE: 98 mg/dL (ref 65–99)
Potassium: 4.7 mmol/L (ref 3.5–5.1)
Sodium: 135 mmol/L (ref 135–145)
Total Bilirubin: 0.7 mg/dL (ref 0.3–1.2)
Total Protein: 6.7 g/dL (ref 6.5–8.1)

## 2015-06-05 LAB — URINE MICROSCOPIC-ADD ON

## 2015-06-05 MED ORDER — MORPHINE SULFATE (PF) 4 MG/ML IV SOLN
4.0000 mg | Freq: Once | INTRAVENOUS | Status: AC
  Administered 2015-06-05: 4 mg via INTRAVENOUS
  Filled 2015-06-05: qty 1

## 2015-06-05 MED ORDER — DEXTROSE 5 % IV SOLN
1.0000 g | Freq: Once | INTRAVENOUS | Status: AC
  Administered 2015-06-05: 1 g via INTRAVENOUS
  Filled 2015-06-05: qty 10

## 2015-06-05 MED ORDER — ONDANSETRON HCL 4 MG/2ML IJ SOLN
4.0000 mg | Freq: Once | INTRAMUSCULAR | Status: AC
  Administered 2015-06-05: 4 mg via INTRAVENOUS
  Filled 2015-06-05: qty 2

## 2015-06-05 MED ORDER — MORPHINE SULFATE (PF) 4 MG/ML IV SOLN
INTRAVENOUS | Status: AC
  Administered 2015-06-05: 4 mg
  Filled 2015-06-05: qty 1

## 2015-06-05 NOTE — ED Notes (Signed)
MD Glick at bedside. 

## 2015-06-05 NOTE — ED Notes (Signed)
Per EMS, pt from home c/o bilateral leg, knee, and hip pain. Pt seen and tx in [facility] yesterday for a fall. Pt has same complaint has yesterday. No dislocation, shortening, deformity, or rotation noted. AOx4.

## 2015-06-05 NOTE — Discharge Instructions (Signed)
Continue taking your home medications, including the pain medication. Talk with Dr. Loney Hering about possibly setting up an MRI scan to see why you are having so much problem with dizziness.  Dizziness Dizziness is a common problem. It is a feeling of unsteadiness or light-headedness. You may feel like you are about to faint. Dizziness can lead to injury if you stumble or fall. A person of any age group can suffer from dizziness, but dizziness is more common in older adults. CAUSES  Dizziness can be caused by many different things, including:  Middle ear problems.  Standing for too long.  Infections.  An allergic reaction.  Aging.  An emotional response to something, such as the sight of blood.  Side effects of medicines.  Tiredness.  Problems with circulation or blood pressure.  Excessive use of alcohol or medicines, or illegal drug use.  Breathing too fast (hyperventilation).  An irregular heart rhythm (arrhythmia).  A low red blood cell count (anemia).  Pregnancy.  Vomiting, diarrhea, fever, or other illnesses that cause body fluid loss (dehydration).  Diseases or conditions such as Parkinson's disease, high blood pressure (hypertension), diabetes, and thyroid problems.  Exposure to extreme heat. DIAGNOSIS  Your health care provider will ask about your symptoms, perform a physical exam, and perform an electrocardiogram (ECG) to record the electrical activity of your heart. Your health care provider may also perform other heart or blood tests to determine the cause of your dizziness. These may include:  Transthoracic echocardiogram (TTE). During echocardiography, sound waves are used to evaluate how blood flows through your heart.  Transesophageal echocardiogram (TEE).  Cardiac monitoring. This allows your health care provider to monitor your heart rate and rhythm in real time.  Holter monitor. This is a portable device that records your heartbeat and can help diagnose  heart arrhythmias. It allows your health care provider to track your heart activity for several days if needed.  Stress tests by exercise or by giving medicine that makes the heart beat faster. TREATMENT  Treatment of dizziness depends on the cause of your symptoms and can vary greatly. HOME CARE INSTRUCTIONS   Drink enough fluids to keep your urine clear or pale yellow. This is especially important in very hot weather. In older adults, it is also important in cold weather.  Take your medicine exactly as directed if your dizziness is caused by medicines. When taking blood pressure medicines, it is especially important to get up slowly.  Rise slowly from chairs and steady yourself until you feel okay.  In the morning, first sit up on the side of the bed. When you feel okay, stand slowly while holding onto something until you know your balance is fine.  Move your legs often if you need to stand in one place for a long time. Tighten and relax your muscles in your legs while standing.  Have someone stay with you for 1-2 days if dizziness continues to be a problem. Do this until you feel you are well enough to stay alone. Have the person call your health care provider if he or she notices changes in you that are concerning.  Do not drive or use heavy machinery if you feel dizzy.  Do not drink alcohol. SEEK IMMEDIATE MEDICAL CARE IF:   Your dizziness or light-headedness gets worse.  You feel nauseous or vomit.  You have problems talking, walking, or using your arms, hands, or legs.  You feel weak.  You are not thinking clearly or you have trouble  forming sentences. It may take a friend or family member to notice this.  You have chest pain, abdominal pain, shortness of breath, or sweating.  Your vision changes.  You notice any bleeding.  You have side effects from medicine that seems to be getting worse rather than better. MAKE SURE YOU:   Understand these instructions.  Will  watch your condition.  Will get help right away if you are not doing well or get worse. Document Released: 03/25/2001 Document Revised: 10/04/2013 Document Reviewed: 04/18/2011 [facility] Patient Information 2015 [place], [place]. This information is not intended to replace advice given to you by your health care provider. Make sure you discuss any questions you have with your health care provider.

## 2015-06-05 NOTE — ED Notes (Signed)
Patient able to ambulate with walker.

## 2015-06-05 NOTE — ED Provider Notes (Signed)
CSN: [id number]     Arrival date & time 06/05/15  1658 History   First MD Initiated Contact with Patient 06/05/15 1824     Chief Complaint  Patient presents with  . Leg Pain     (Consider location/radiation/quality/duration/timing/severity/associated sxs/prior Treatment) Patient is a 64 y.o. female presenting with leg pain. The history is provided by the patient.  Leg Pain She was in emergency department yesterday for a fall. She states that her knees give out on her as she was getting out of the shower. Since then, she states that her equilibrium has been off and she cannot stand or walk because her balance is so bad. She is actually been having problems with her equilibrium for several months but it is much worse since yesterday. She is complaining of ongoing pain in both legs, worse on the right. She denies any new injury since the fall yesterday. There has been some nausea but no vomiting. She denies any bowel or bladder dysfunction. She is on chronic pain medication of hydrocodone-acetaminophen 10-325.  Past Medical History  Diagnosis Date  . Arthritis   . Bronchitis   . Hypertension   . Chronic back pain   . Chronic knee pain   . COPD (chronic obstructive pulmonary disease)   . Memory difficulty   . Weakness of both legs    Past Surgical History  Procedure Laterality Date  . Knee surgery  2007  . Appendectomy  1971  . Tonsillectomy  1971   Family History  Problem Relation Age of Onset  . Heart failure Father   . Diabetes Father   . Kidney failure Father    Social History  Substance Use Topics  . Smoking status: Light Tobacco Smoker -- 0.50 packs/day for 10 years    Types: Cigarettes  . Smokeless tobacco: None     Comment: 04/30/15 less than 1 PPD  . Alcohol Use: 0.0 oz/week    0 Standard drinks or equivalent per week     Comment: Very rare   OB History    Gravida Para Term Preterm AB TAB SAB Ectopic Multiple Living   Review of Systems  All  other systems reviewed and are negative.     Allergies  Bee venom; Amitriptyline; Mushroom extract complex; Toradol; Tramadol; and Trazodone and nefazodone  Home Medications   Prior to Admission medications   Medication Sig Start Date End Date Taking? Authorizing Provider  albuterol (PROVENTIL HFA;VENTOLIN HFA) 108 (90 BASE) MCG/ACT inhaler Inhale 2 puffs into the lungs every 6 (six) hours as needed for wheezing. 06/21/13   Nimish Normajean Glasgow, MD  amLODipine (NORVASC) 2.5 MG tablet Take 2.5 mg by mouth daily.    Historical Provider, MD  cyclobenzaprine (FLEXERIL) 10 MG tablet Take 10 mg by mouth 3 (three) times daily as needed for muscle spasms.    Historical Provider, MD  diazepam (VALIUM) 5 MG tablet Take 5 mg by mouth 3 (three) times daily as needed for anxiety.    Historical Provider, MD  diphenhydrAMINE (BENADRYL) 25 MG tablet Take 2 tablets (50 mg total) by mouth every 4 (four) hours as needed for itching. 12/17/13   Wayland Salinas, MD  DULoxetine (CYMBALTA) 60 MG capsule Take 60 mg by mouth daily.    Historical Provider, MD  EVZIO 0.4 MG/0.4ML SOAJ Inject 0.4 mLs into the muscle once as needed (for Anaphylaxis/allergic reaction).  02/19/15   Historical Provider, MD  gabapentin (NEURONTIN) 300 MG capsule Take 300 mg by mouth 3 (three) times daily.    Historical Provider, MD  HYDROcodone-acetaminophen (NORCO) 10-325 MG per tablet Take 1 tablet by mouth every 6 (six) hours as needed for moderate pain or severe pain.    Historical Provider, MD  hydrOXYzine (VISTARIL) 25 MG capsule Take 25 mg by mouth at bedtime.    Historical Provider, MD  metFORMIN (GLUCOPHAGE-XR) 500 MG 24 hr tablet Take 500 mg by mouth at bedtime.  12/15/14   Historical Provider, MD  metoprolol succinate (TOPROL-XL) 50 MG 24 hr tablet Take 50 mg by mouth at bedtime. Take with or immediately following a meal.    Historical Provider, MD  naproxen (NAPROSYN) 500 MG tablet Take 1 tablet (500 mg total) by mouth 2 (two) times daily with a  meal. Patient not taking: Reported on 05/16/2015 05/03/15   Eber Hong, MD  VOLTAREN 1 % GEL Apply 4 g topically every 8 (eight) hours.  02/06/15   Historical Provider, MD   BP 147/79 mmHg  Pulse 49  Temp(Src) 97.7 F (36.5 C) (Oral)  Resp 17  Ht 5\' 4"  (1.626 m)  Wt 225 lb (102.059 kg)  BMI 38.60 kg/m2  SpO2 91% Physical Exam  Nursing note and vitals reviewed.  64 year old female, resting comfortably and in no acute distress. Vital signs are significant for hypertension and bradycardia. Oxygen saturation is 91%, which is hypoxic. Head is normocephalic and atraumatic. PERRLA, EOMI. Oropharynx is clear. Neck is nontender without adenopathy or JVD. Back is nontender and there is no CVA tenderness. Lungs are clear without rales, wheezes, or rhonchi. Chest is nontender. Heart has regular rate and rhythm without murmur. Abdomen is soft, flat, nontender without masses or hepatosplenomegaly and peristalsis is normoactive. Extremities have no cyanosis or edema. Tenderness is present over both knees and there is pain on passive range of motion, but full range of motion is present. Skin is warm and dry without rash. Neurologic: She is awake and oriented, but is slow to answer questions. Cranial nerves are grossly intact, but she does have diplopia on lateral gaze in each direction. Motor strength is grossly symmetric, but she has very poor effort with strength only 3-4/5 in her arms. Finger to nose test was very slow when performed with some past pointing bilaterally.  ED Course  Procedures (including critical care time) Labs Review Results for orders placed or performed during the hospital encounter of 06/05/15  Comprehensive metabolic panel  Result Value Ref Range   Sodium 135 135 - 145 mmol/L   Potassium 4.7 3.5 - 5.1 mmol/L   Chloride 99 (L) 101 - 111 mmol/L   CO2 30 22 - 32 mmol/L   Glucose, Bld 98 65 - 99 mg/dL   BUN 15 6 - 20 mg/dL   Creatinine, Ser 1.82 0.44 - 1.00 mg/dL   Calcium  9.2 8.9 - 99.3 mg/dL   Total Protein 6.7 6.5 - 8.1 g/dL   Albumin 3.6 3.5 - 5.0 g/dL   AST 21 15 - 41 U/L   ALT 22 14 - 54 U/L   Alkaline Phosphatase 112 38 - 126 U/L   Total Bilirubin 0.7 0.3 - 1.2 mg/dL   GFR calc non Af Amer >60 >60 mL/min   GFR calc Af Amer >60 >60 mL/min   Anion gap 6 5 - 15  CBC with Differential  Result Value Ref Range   WBC 13.5 (H) 4.0 - 10.5 K/uL   RBC 5.29 (H)  3.87 - 5.11 MIL/uL   Hemoglobin 16.3 (H) 12.0 - 15.0 g/dL   HCT 16.1 (H) 09.6 - 04.5 %   MCV 93.0 78.0 - 100.0 fL   MCH 30.8 26.0 - 34.0 pg   MCHC 33.1 30.0 - 36.0 g/dL   RDW 40.9 81.1 - 91.4 %   Platelets 240 150 - 400 K/uL   Neutrophils Relative % 50 43 - 77 %   Neutro Abs 6.7 1.7 - 7.7 K/uL   Lymphocytes Relative 40 12 - 46 %   Lymphs Abs 5.4 (H) 0.7 - 4.0 K/uL   Monocytes Relative 8 3 - 12 %   Monocytes Absolute 1.1 (H) 0.1 - 1.0 K/uL   Eosinophils Relative 2 0 - 5 %   Eosinophils Absolute 0.2 0.0 - 0.7 K/uL   Basophils Relative 0 0 - 1 %   Basophils Absolute 0.0 0.0 - 0.1 K/uL  Urinalysis, Routine w reflex microscopic (not at [facility])  Result Value Ref Range   Color, Urine YELLOW YELLOW   APPearance CLEAR CLEAR   Specific Gravity, Urine 1.020 1.005 - 1.030   pH 5.5 5.0 - 8.0   Glucose, UA NEGATIVE NEGATIVE mg/dL   Hgb urine dipstick TRACE (A) NEGATIVE   Bilirubin Urine NEGATIVE NEGATIVE   Ketones, ur NEGATIVE NEGATIVE mg/dL   Protein, ur NEGATIVE NEGATIVE mg/dL   Urobilinogen, UA 1.0 0.0 - 1.0 mg/dL   Nitrite NEGATIVE NEGATIVE   Leukocytes, UA NEGATIVE NEGATIVE  Urine microscopic-add on  Result Value Ref Range   Squamous Epithelial / LPF RARE RARE   WBC, UA 0-2 <3 WBC/hpf   RBC / HPF 0-2 <3 RBC/hpf   Bacteria, UA MANY (A) RARE   Urine-Other AMORPHOUS URATES/PHOSPHATES     Imaging Review Dg Lumbar Spine Complete  06/04/2015   CLINICAL DATA:  Acute lower back pain after fall in the shower.  EXAM: LUMBAR SPINE - COMPLETE 4+ VIEW  COMPARISON:  None.  FINDINGS: No fracture or  spondylolisthesis is noted. Mild degenerative disc disease is noted at L2-3 and L4-5. Anterior osteophyte formation is noted at these levels as well as L3-4. Posterior facet joints appear normal.  IMPRESSION: Mild multilevel degenerative disc disease. No acute abnormality seen in the lumbar spine.   Electronically Signed   By: Lupita Raider, M.D.   On: 06/04/2015 15:33   Dg Pelvis 1-2 Views  06/04/2015   CLINICAL DATA:  Fall, bilateral knee pain  EXAM: PELVIS - 1-2 VIEW  COMPARISON:  None.  FINDINGS: There is no evidence of pelvic fracture or diastasis. No pelvic bone lesions are seen.  IMPRESSION: Negative.   Electronically Signed   By: Natasha Mead M.D.   On: 06/04/2015 15:28   Ct Head Wo Contrast  06/05/2015   CLINICAL DATA:  Fall 8-22 in shower landing on knees; bilateral leg pain since fall; pt states she has been off balance since falling yesterday;  EXAM: CT HEAD WITHOUT CONTRAST  CT CERVICAL SPINE WITHOUT CONTRAST  TECHNIQUE: Multidetector CT imaging of the head and cervical spine was performed following the standard protocol without intravenous contrast. Multiplanar CT image reconstructions of the cervical spine were also generated.  COMPARISON:  01/21/2015  FINDINGS: CT HEAD FINDINGS  Ventricles are normal in size and configuration. There are no parenchymal masses or mass effect. No evidence of an infarct. There are no extra-axial masses or abnormal fluid collections.  There is no intracranial hemorrhage.  Mild mucosal thickening lines the left maxillary sinus. There is a small  amount of dependent fluid in the left maxillary sinus. Remaining visualized sinuses are clear. Clear middle ear cavities and mastoid air cells. No skull fracture.  CT CERVICAL SPINE FINDINGS  No fracture. No spondylolisthesis. There is moderate loss of disc height with endplate osteophytes at C5-C6. Endplate osteophytes are also noted at C4-C5 and C7-T1. Uncovertebral spurring leads to moderate neural foraminal narrowing on  the left at C5-C6. No other significant stenosis.  Soft tissues are unremarkable.  Lung apices are clear.  IMPRESSION: HEAD CT: No intracranial abnormality. No skull fracture. Left maxillary sinus mucosal thickening with dependent fluid.  CERVICAL CT:  No fracture or acute finding.   Electronically Signed   By: Amie Portland M.D.   On: 06/05/2015 20:13   Ct Cervical Spine Wo Contrast  06/05/2015   CLINICAL DATA:  Fall 8-22 in shower landing on knees; bilateral leg pain since fall; pt states she has been off balance since falling yesterday;  EXAM: CT HEAD WITHOUT CONTRAST  CT CERVICAL SPINE WITHOUT CONTRAST  TECHNIQUE: Multidetector CT imaging of the head and cervical spine was performed following the standard protocol without intravenous contrast. Multiplanar CT image reconstructions of the cervical spine were also generated.  COMPARISON:  01/21/2015  FINDINGS: CT HEAD FINDINGS  Ventricles are normal in size and configuration. There are no parenchymal masses or mass effect. No evidence of an infarct. There are no extra-axial masses or abnormal fluid collections.  There is no intracranial hemorrhage.  Mild mucosal thickening lines the left maxillary sinus. There is a small amount of dependent fluid in the left maxillary sinus. Remaining visualized sinuses are clear. Clear middle ear cavities and mastoid air cells. No skull fracture.  CT CERVICAL SPINE FINDINGS  No fracture. No spondylolisthesis. There is moderate loss of disc height with endplate osteophytes at C5-C6. Endplate osteophytes are also noted at C4-C5 and C7-T1. Uncovertebral spurring leads to moderate neural foraminal narrowing on the left at C5-C6. No other significant stenosis.  Soft tissues are unremarkable.  Lung apices are clear.  IMPRESSION: HEAD CT: No intracranial abnormality. No skull fracture. Left maxillary sinus mucosal thickening with dependent fluid.  CERVICAL CT:  No fracture or acute finding.   Electronically Signed   By: Amie Portland  M.D.   On: 06/05/2015 20:13   Ct Knee Right Wo Contrast  06/04/2015   CLINICAL DATA:  Fall onto knees in shower today with bilateral knee pain. Irregularity along the patella on conventional radiography, raising the possibility of fracture.  EXAM: CT OF THE right KNEE WITHOUT CONTRAST  TECHNIQUE: Multidetector CT imaging of the a right knee was performed according to the standard protocol. Multiplanar CT image reconstructions were also generated.  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 Loss of articular space most notable in the medial compartment but especially in the patellofemoral joint.  Chronically fragmented spurring from the lateral margin of the patella noted, well corticated and not thought to represent an acute fracture. There is essentially full-thickness loss of articular cartilage in the patellofemoral joint.  Posterior to the expected position of the posterior horn lateral meniscus there is a ossific structure on image 22 of series 3 probably representing a chronically fragmented osteophyte. A free osteochondral fragment in the popliteus recess is possible.  No overt knee effusion.  IMPRESSION: 1. Severe tricompartmental spurring with some chronic fragmentation. I do not observe a fracture or knee effusion. I cannot completely exclude a free osteochondral fragment posterolaterally.  Electronically Signed   By: Gaylyn Rong M.D.   On: 06/04/2015 17:00   Ct Angio Ao+bifem W/cm &/or Wo/cm  06/04/2015   CLINICAL DATA:  Law filling of lower legs. Bilateral lower leg pain. Both feet are cold to the touch.  EXAM: CT ANGIOGRAPHY OF ABDOMINAL AORTA WITH ILIOFEMORAL RUNOFF  TECHNIQUE: Multidetector CT imaging of the abdomen, pelvis and lower extremities was performed using the standard protocol during bolus administration of intravenous contrast. Multiplanar CT image reconstructions and MIPs were obtained to  evaluate the vascular anatomy.  CONTRAST:  OMNIPAQUE IOHEXOL 350 MG/ML SOLN  COMPARISON:  None.  FINDINGS: Aorta: Normal caliber. No aneurysm or dissection. Infrarenal abdominal aorta demonstrates mild mural thrombus and calcification consistent with atherosclerotic change. No significant stenosis. Focal stenosis demonstrated in the origin of the celiac axis. Vessels distal to stenosis are patent. The abdominal aorta, superior mesenteric artery, single bilateral renal arteries, inferior mesenteric artery, and bilateral iliac, external iliac, and internal iliac arteries are patent. Scattered calcific atherosclerotic changes are demonstrated. Renal nephrograms are symmetrical.  Right Lower Extremity: Atherosclerotic calcification in the right common femoral artery without evidence of critical stenosis. The right superficial femoral artery is patent although the distal portion demonstrates segmental narrowing due to atherosclerotic change. Popliteal artery demonstrates atherosclerotic changes resulting in mild diffuse narrowing without occlusion. Tibioperoneal trunk is patent and there is two-vessel flow to the right ankle via anterior tibial and and posterior tibial arteries. Peroneal artery is patent but diminishes just above the ankle.  Left Lower Extremity: Atherosclerotic calcification in the common femoral artery and proximal superficial femoral artery without evidence of occlusion. Common femoral, superficial femoral, deep femoral, and popliteal arteries are patent. Focal areas of non critical stenosis are suggested in the mid and distal superficial femoral artery and in the popliteal artery. Tibial peroneal trunk is patent. Three-vessel runoff is demonstrated to the left ankle.  Review of the MIP images confirms the above findings.  Abdomen: Liver, spleen, gallbladder, pancreas, adrenal glands, kidneys, abdominal aorta, inferior vena cava, and retroperitoneal lymph nodes are unremarkable. Stomach, small  bowel, and colon are not abnormally distended. No free air or free fluid in the abdomen.  Pelvis: Uterus and ovaries are not enlarged. No pelvic mass or lymphadenopathy. No free or loculated pelvic fluid collections. Bladder is decompressed. Degenerative changes in the spine. No destructive bone lesions appreciated.  IMPRESSION: Scattered calcific atherosclerotic changes demonstrated with focal areas of non critical narrowing demonstrated in the inferior abdominal aorta, bilateral common femoral arteries, and bilateral superficial femoral and popliteal arteries. No evidence of vascular occlusion. Three-vessel runoff to the left ankle. Three-vessel runoff nearly to the right ankle although the distal peroneal artery diminishes.   Electronically Signed   By: Burman Nieves M.D.   On: 06/04/2015 21:38   Dg Knee Complete 4 Views Left  06/04/2015   CLINICAL DATA:  Acute left knee pain after fall in shower. Initial encounter.  EXAM: LEFT KNEE - COMPLETE 4+ VIEW  COMPARISON:  January 11, 2015.  FINDINGS: There is no evidence of fracture, dislocation, or joint effusion. Mild tricompartmental joint space narrowing is noted with osteophyte formation. Soft tissues are unremarkable.  IMPRESSION: Mild tricompartmental degenerative joint disease. No acute abnormality seen in the left knee.   Electronically Signed   By: Lupita Raider, M.D.   On: 06/04/2015 15:31   Dg Knee Complete 4 Views Right  06/04/2015   CLINICAL DATA:  Fall onto the knee in the shower, pain  EXAM: RIGHT  KNEE - COMPLETE 4+ VIEW  COMPARISON:  05/03/2015  FINDINGS: There is no evidence of fracture, dislocation, or joint effusion. There is no evidence of arthropathy or other focal bone abnormality. Soft tissues are unremarkable. Mild tricompartmental degenerative change identified. Minimal irregular trabeculation of the patella.  IMPRESSION: Mild tricompartmental degenerative change. There is minimal irregular trabeculation of the patella. If there is  focal point tenderness raising the question of patellar fracture, consider CT for further evaluation.   Electronically Signed   By: Christiana Pellant M.D.   On: 06/04/2015 15:32   I have personally reviewed and evaluated these images and lab results as part of my medical decision-making.    MDM   Final diagnoses:  Contusion of leg, right, subsequent encounter  Dizziness    Lack of balance of uncertain cause. With the recent fall, she will need CT of the head to rule out either recent stroke or intracranial injury. Screening labs will be obtained. She may need to be kept for MRI scan. Old records are reviewed confirming ED visit for fall yesterday with x-rays and CT scans showing no evidence of fracture in hips or knees.  CT is unremarkable. Screening labs are also unremarkable. No findings to explain her dizziness. She probably will need MRI scan to rule out posterior circulation stroke. However, there has not been any acute change to warrant hospitalization. Patient was able to ambulate in the ED with the walker. This is what she uses at home. Patient and her daughter did wish to be admitted but I have explained to them that there is no medical indication for hospitalization at this point. MRI can be obtained as an outpatient and they're encouraged to follow-up with her PCP to get that scheduled. They are also advised they're welcome to return at any time for further evaluation.  [name], MD 06/05/15 [phone]

## 2015-06-07 LAB — URINE CULTURE: Culture: NO GROWTH

## 2015-06-13 ENCOUNTER — Emergency Department (HOSPITAL_COMMUNITY)
Admission: EM | Admit: 2015-06-13 | Discharge: 2015-06-13 | Disposition: A | Discharge status: Home / Self Care | Payer: Medicare Other | Attending: Emergency Medicine | Admitting: Emergency Medicine

## 2015-06-13 ENCOUNTER — Encounter (HOSPITAL_COMMUNITY): Payer: Self-pay | Admitting: Emergency Medicine

## 2015-06-13 DIAGNOSIS — J449 Chronic obstructive pulmonary disease, unspecified: Secondary | ICD-10-CM | POA: Insufficient documentation

## 2015-06-13 DIAGNOSIS — R451 Restlessness and agitation: Secondary | ICD-10-CM

## 2015-06-13 DIAGNOSIS — G8929 Other chronic pain: Secondary | ICD-10-CM | POA: Diagnosis not present

## 2015-06-13 DIAGNOSIS — M1990 Unspecified osteoarthritis, unspecified site: Secondary | ICD-10-CM | POA: Insufficient documentation

## 2015-06-13 DIAGNOSIS — I10 Essential (primary) hypertension: Secondary | ICD-10-CM | POA: Diagnosis not present

## 2015-06-13 DIAGNOSIS — Z79899 Other long term (current) drug therapy: Secondary | ICD-10-CM | POA: Insufficient documentation

## 2015-06-13 DIAGNOSIS — Z720 Tobacco use: Secondary | ICD-10-CM | POA: Insufficient documentation

## 2015-06-13 DIAGNOSIS — R4182 Altered mental status, unspecified: Secondary | ICD-10-CM | POA: Diagnosis present

## 2015-06-13 HISTORY — DX: Other symptoms and signs involving cognitive functions and awareness: R41.89

## 2015-06-13 LAB — ETHANOL: Alcohol, Ethyl (B): <5 mg/dL (ref <5)

## 2015-06-13 LAB — CBC WITH DIFFERENTIAL/PLATELET
BASOS ABS: 0.1 10*3/uL (ref 0.0–0.1)
Basophils Relative: 0 % (ref 0–1)
EOS PCT: 3 % (ref 0–5)
Eosinophils Absolute: 0.3 10*3/uL (ref 0.0–0.7)
HEMATOCRIT: 51.6 % — AB (ref 36.0–46.0)
Hemoglobin: 17.1 g/dL — ABNORMAL HIGH (ref 12.0–15.0)
LYMPHS ABS: 5.6 10*3/uL — AB (ref 0.7–4.0)
LYMPHS PCT: 49 % — AB (ref 12–46)
MCH: 30.7 pg (ref 26.0–34.0)
MCHC: 33.1 g/dL (ref 30.0–36.0)
MCV: 92.6 fL (ref 78.0–100.0)
Monocytes Absolute: 0.7 10*3/uL (ref 0.1–1.0)
Monocytes Relative: 6 % (ref 3–12)
NEUTROS ABS: 4.7 10*3/uL (ref 1.7–7.7)
Neutrophils Relative %: 42 % — ABNORMAL LOW (ref 43–77)
PLATELETS: 245 10*3/uL (ref 150–400)
RBC: 5.57 MIL/uL — AB (ref 3.87–5.11)
RDW: 14.1 % (ref 11.5–15.5)
WBC: 11.4 10*3/uL — AB (ref 4.0–10.5)

## 2015-06-13 LAB — BASIC METABOLIC PANEL
ANION GAP: 9 (ref 5–15)
BUN: 14 mg/dL (ref 6–20)
CHLORIDE: 98 mmol/L — AB (ref 101–111)
CO2: 29 mmol/L (ref 22–32)
Calcium: 9.2 mg/dL (ref 8.9–10.3)
Creatinine, Ser: 0.6 mg/dL (ref 0.44–1.00)
GFR calc Af Amer: >60 mL/min (ref >60)
GFR calc non Af Amer: >60 mL/min (ref >60)
GLUCOSE: 95 mg/dL (ref 65–99)
POTASSIUM: 4.1 mmol/L (ref 3.5–5.1)
SODIUM: 136 mmol/L (ref 135–145)

## 2015-06-13 LAB — URINALYSIS, ROUTINE W REFLEX MICROSCOPIC
BILIRUBIN URINE: NEGATIVE
Glucose, UA: NEGATIVE mg/dL
KETONES UR: NEGATIVE mg/dL
LEUKOCYTES UA: NEGATIVE
NITRITE: NEGATIVE
PROTEIN: NEGATIVE mg/dL
Specific Gravity, Urine: 1.025 (ref 1.005–1.030)
Urobilinogen, UA: 0.2 mg/dL (ref 0.0–1.0)
pH: 5.5 (ref 5.0–8.0)

## 2015-06-13 LAB — RAPID URINE DRUG SCREEN, HOSP PERFORMED
Amphetamines: NOT DETECTED
BENZODIAZEPINES: POSITIVE — AB
Barbiturates: NOT DETECTED
COCAINE: NOT DETECTED
Opiates: POSITIVE — AB
Tetrahydrocannabinol: NOT DETECTED

## 2015-06-13 LAB — URINE MICROSCOPIC-ADD ON

## 2015-06-13 MED ORDER — HYDROCODONE-ACETAMINOPHEN 5-325 MG PO TABS
2.0000 | ORAL_TABLET | Freq: Once | ORAL | Status: AC
Start: 2015-06-13 — End: 2015-06-13
  Administered 2015-06-13: 2 via ORAL
  Filled 2015-06-13: qty 2

## 2015-06-13 NOTE — BH Assessment (Addendum)
Tele Assessment Note   Carla [name] is a single, Caucasian, 64 y.o. female presenting to [facility] via EMS with altered mental status. Per pt's family, there was an argument this evening and the pt became angry and attempted to leave the home. Pt stated that she was going back to her house in [place], but pt does not live in [place]. Pt resides with her 2 daughters and their families. Pt is disoriented to time, date, and situation. Per daughter, pt has been "out of it", not responding to family members, and confused. During assessment, pt is unable to name the current president or the year and says that she does not recognize her daughters. Pt's daughters called 911 and police officer helped to get pt to stop walking down the road. APS worker Sharlet Salina w/[facility]) also came out to the home and told the pt's family that the pt needed to be assessed at the ED. Pt is very irritable throughout interview, stating several times, "I just want to go home" and "I don't have any problems". Pt maintains fair eye-contact, is agitated, and is slow to respond to questions. Thought process is logical with no indications of delusional content, but memory is poor. Pt does not appear to be responding to internal stimuli. Throughout assessment, pt quickly becomes oriented whenever a question or subject is brought up that pertains to her chronic pain or being discharged. Per Dr Clarene Duke, EDP, pt quickly oriented and became irritable when she was informed that she would not be getting any prescription pain medication at the ED. Pt reports unrelenting chronic pain d/t arthritis, back pain, and knee pain.   Pt noted to exhibit some Cluster B traits and it is unclear if pt is malingering or has some form of dissociative disorder. Dr Clarene Duke reports that the pt frequents the ED after family conflict such as this. Pt has been to the ED due to disorientation before as well. There are no reported personality changes and pt can still complete  ADL's. Pt recently saw a neurologist who was informed of the pt's episodes of confusion and disorientation. The neurologist suggested she may be experiencing what he referred to as "pseudo-dementia" triggered by stress, depression, and/or chronic pain. He also recommended that pt get a psych evaluation. Pt is currently under the care of Dr Jannifer Franklin (psychiatrist) and "[name]" (therapist). She has a hx of depression and endorses some depressive sx, such as insomnia, fatigue, isolation, appetite fluctuation, and irritability. She denies SI/HI or any hx of suicide attempt. She denies A/VH or SA. No hx of inpt psychiatric hospitalization. No known hx of violence. No access to weapons. Pt reports compliance with medication. She denies any hx of trauma or abuse. She uses a walker, and also has a wheelchair and cane. Pt says she can contract for safety.  Disposition: Per Janann August, NP, pt does not meet inpt criteria. Pt can be d/c to follow-up with her OP provider, Dr Jannifer Franklin. Pt can contract for safety.  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 Arthritis   . Bronchitis   . Hypertension   . Chronic back pain   . Chronic knee pain   .  COPD (chronic obstructive pulmonary disease)   . Memory difficulty 2015  . Weakness of both legs   . 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 Arthritis   . Bronchitis   . Hypertension   . Chronic back pain   . Chronic knee pain   . COPD (chronic obstructive  pulmonary disease)   . Memory difficulty 2015  . Weakness of both legs   . 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 Knee surgery  2007  . Appendectomy  1971  . Tonsillectomy  1971    Family History:  Family History  Problem Relation Age of Onset  . Heart failure Father   . Diabetes Father   . Kidney failure Father     Social History:  reports that she has been smoking Cigarettes.  She has a 5 pack-year smoking history. She does not have any smokeless tobacco history on file. She reports that she drinks alcohol. She reports that she does not use illicit drugs.  Additional Social History:  Alcohol / Drug Use Pain Medications: Hydrocodone for chronic pain (See PTA List) Prescriptions: See PTA List Over the Counter: See PTA List History of alcohol / drug use?: No history of alcohol / drug abuse  CIWA: CIWA-Ar BP: 165/69 mmHg Pulse Rate: 62 COWS:    PATIENT STRENGTHS: (choose at least two) Ability for insight Average or above average intelligence Communication skills Supportive family/friends  Allergies:  Allergies  Allergen Reactions  . Bee Venom Swelling  . Amitriptyline Other (See Comments)    Has nightmares when using  . Mushroom Extract Complex     Extreme sweating, vomiting  . Toradol [Ketorolac Tromethamine] Nausea And Vomiting  . Tramadol Nausea And Vomiting  . Trazodone And Nefazodone Nausea And Vomiting    Home Medications:  (Not in a hospital admission)  OB/GYN Status:  No LMP recorded. Patient is postmenopausal.  General Assessment Data Location of Assessment: [facility] TTS Assessment: In system Is this a Tele or Face-to-Face Assessment?: Tele Assessment Is this an Initial Assessment or a Re-assessment for this encounter?: Initial Assessment Marital status: Single Maiden name: n/a Is patient pregnant?: No Pregnancy Status: No Living  Arrangements: Children, Other relatives (2 daughters and their families) Can pt return to current living arrangement?: Yes Admission Status: Voluntary Is patient capable of signing voluntary admission?: Yes Referral Source: Self/Family/Friend Insurance type: Medicare     Crisis Care Plan Living Arrangements: Children, Other relatives (2 daughters and their families) Name of Psychiatrist: Dr Jannifer Franklin Name of Therapist: "[name]"  Education Status Is patient currently in school?: No Current Grade: na Highest grade of school patient has completed: na Name of school: na Contact person: na  Risk to self with the past 6 months Suicidal Ideation: No Has patient been a risk to self within the past 6 months prior to admission? : No Suicidal Intent: No Has patient had any suicidal intent within the past 6 months prior to admission? : No Is patient at risk for suicide?: No Suicidal Plan?: No Has patient had any suicidal plan within the past 6 months prior to admission? : No Access to Means:  No What has been your use of drugs/alcohol within the last 12 months?: None reported Previous Attempts/Gestures: No How many times?: 0 Other Self Harm Risks: Pt has episodes where she becomes disoriented, tries to walk off from home, etc Triggers for Past Attempts:  (na) Intentional Self Injurious Behavior: None Family Suicide History: No Recent stressful life event(s): Conflict (Comment) (Family problems, worsening chronic pain, etc ) Persecutory voices/beliefs?: No Depression: Yes Depression Symptoms: Insomnia, Isolating, Fatigue, Feeling angry/irritable Substance abuse history and/or treatment for substance abuse?: No Suicide prevention information given to non-admitted patients: Not applicable  Risk to Others within the past 6 months Homicidal Ideation: No Does patient have any lifetime risk of violence toward others beyond the six months prior to admission? : Unknown Thoughts of Harm to  Others: No Current Homicidal Intent: No Current Homicidal Plan: No Access to Homicidal Means: No Identified Victim: na History of harm to others?: No Assessment of Violence: None Noted Violent Behavior Description: Pt cooperative with assessment. No known hx of violence. Does patient have access to weapons?: No Criminal Charges Pending?: No Does patient have a court date: No Is patient on probation?: No  Psychosis Hallucinations: None noted Delusions: None noted  Mental Status Report Appearance/Hygiene: Unremarkable Eye Contact: Fair Motor Activity: Agitation Speech: Slow Level of Consciousness: Irritable Mood: Depressed, Irritable Affect: Irritable Anxiety Level: Moderate Thought Processes: Thought Blocking Judgement: Partial Orientation: Person, Place Obsessive Compulsive Thoughts/Behaviors: None  Cognitive Functioning Concentration: Decreased Memory: Recent Impaired IQ: Average Insight: Poor Impulse Control: Fair Appetite: Fair Weight Loss: 0 Weight Gain: 0 Sleep: Decreased Total Hours of Sleep: 5 Vegetative Symptoms: None  ADLScreening [facility] Assessment Services) Patient's cognitive ability adequate to safely complete daily activities?: Yes Patient able to express need for assistance with ADLs?: Yes Independently performs ADLs?: Yes (appropriate for developmental age)  Prior Inpatient Therapy Prior Inpatient Therapy: No Prior Therapy Dates: na Prior Therapy Facilty/Provider(s): na Reason for Treatment: na  Prior Outpatient Therapy Prior Outpatient Therapy: Yes Prior Therapy Dates: Current Prior Therapy Facilty/Provider(s): Dr. Jannifer Franklin (psychiatrist) and "[name]" (therapist) Reason for Treatment: Depression, Anxiety Does patient have an ACCT team?: No Does patient have Intensive In-House Services?  : No Does patient have [facility] services? : No Does patient have P4CC services?: No  ADL Screening (condition at time of admission) Patient's cognitive  ability adequate to safely complete daily activities?: Yes Is the patient deaf or have difficulty hearing?: No Does the patient have difficulty seeing, even when wearing glasses/contacts?: No Does the patient have difficulty concentrating, remembering, or making decisions?: Yes Patient able to express need for assistance with ADLs?: Yes Does the patient have difficulty dressing or bathing?: No Independently performs ADLs?: Yes (appropriate for developmental age) Does the patient have difficulty walking or climbing stairs?: No Weakness of Legs: Both (chronic pain in back and knees, arthritis) Weakness of Arms/Hands: None  Home Assistive Devices/Equipment Home Assistive Devices/Equipment: Wheelchair, Environmental consultant (specify type), Cane (specify quad or straight)    Abuse/Neglect Assessment (Assessment to be complete while patient is alone) Physical Abuse: Denies Verbal Abuse: Denies Sexual Abuse: Denies Exploitation of patient/patient's resources: Denies Self-Neglect: Denies Values / Beliefs Cultural Requests During Hospitalization: None Spiritual Requests During Hospitalization: None   Advance Directives (For Healthcare) Does patient have an advance directive?: No Would patient like information on creating an advanced directive?: No - patient declined information    Additional Information 1:1 In Past 12 Months?: No CIRT Risk: No Elopement Risk: Yes Does patient have medical clearance?: Yes  Disposition: Per Janann August, NP, pt does not meet inpt criteria. Pt can be d/c to follow-up with her OP provider, Dr Jannifer Franklin. Disposition Initial Assessment Completed for this Encounter: Yes  Cyndie Mull, MS, [facility] Therapeutic Triage [facility]  06/14/2015 12:47 AM

## 2015-06-13 NOTE — Discharge Instructions (Signed)
°Emergency Department Resource Guide °1) Find a Doctor and Pay Out of Pocket °Although you won't have to find out who is covered by your insurance plan, it is a good idea to ask around and get recommendations. You will then need to call the office and see if the doctor you have chosen will accept you as a new patient and what types of options they offer for patients who are self-pay. Some doctors offer discounts or will set up payment plans for their patients who do not have insurance, but you will need to ask so you aren't surprised when you get to your appointment. ° °2) Contact Your Local Health Department °Not all health departments have doctors that can see patients for sick visits, but many do, so it is worth a call to see if yours does. If you don't know where your local health department is, you can check in your phone book. The CDC also has a tool to help you locate your state's health department, and many state websites also have listings of all of their local health departments. ° °3) Find a Walk-in Clinic °If your illness is not likely to be very severe or complicated, you may want to try a walk in clinic. These are popping up all over the country in pharmacies, drugstores, and shopping centers. They're usually staffed by nurse practitioners or physician assistants that have been trained to treat common illnesses and complaints. They're usually fairly quick and inexpensive. However, if you have serious medical issues or chronic medical problems, these are probably not your best option. ° °No Primary Care Doctor: °- Call [facility] at  832-8000 - they can help you locate a primary care doctor that  accepts your insurance, provides certain services, etc. °- Physician Referral Service- 1-800-533-3463 ° °Chronic Pain Problems: °Organization         Address  Phone   Notes  °[facility]  (336) 297-2271 Patients need to be referred by their primary care doctor.  ° °Medication  Assistance: °Organization         Address  Phone   Notes  °[place] [facility] [address] °[place], [place] [address] (336) 641-8030 --Must be a resident of [place] °-- Must have NO insurance coverage whatsoever (no Medicaid/ Medicare, etc.) °-- The pt. MUST have a primary care doctor that directs their care regularly and follows them in the community °  °[facility]  (866) 331-1348   °[employer]  (888) 892-1162   ° °Agencies that provide inexpensive medical care: °Organization         Address  Phone   Notes  °[facility]  (336) 832-8035   °[facility]    (336) 832-7272   °[facility] [address] °[place], [place] [address] (336) 832-4777   °[facility] [address], °[place] (336) 271-4999   °[facility]    (336) 373-0678   °[facility]    (336) 272-1050   °[facility] ° [address], [place] Phone:  (336) 832-4444, Fax:  (336) 832-4440 Hours of Operation:  9 am - 6 pm, M-F.  Also accepts Medicaid/Medicare and self-pay.  °[facility] ° [address], [place] Phone: (336) [phone], Fax: (336) [phone]. Hours of Operation:  8:30 am - 5:30 pm, M-F.  Also accepts Medicaid and self-pay.  °[facility] [place] [address], [place] Phone: (336) 878-6027   °[facility] [address], [place], [place] (336)723-1848, Ext. 123 Mondays & Thursdays: 7-9 AM.  First 15 patients are seen on a first come, first serve basis. °  ° °Medicaid-accepting [place] Providers: ° °Organization         Address  Phone   Notes  °[facility] [address], [place] (336) 641-2100 Also accepts self-pay patients.  °[facility] [address], [place] ° (336) 856-9996   °[facility] [address], [place]  (336) 288-8857   °[facility] [address] [place] [address], [place] (336) 299-7000   °Veita Bland [address], [place]  ° (336) 373-1557 Only accepts [place] Access Medicaid patients after they have their name applied to their card.  ° °Self-Pay (no insurance) in [place]: ° °Organization         Address  Phone   Notes  °Sickle Cell Patients, [facility] [address], [place] (336) 832-1970   °[facility] [address], [place] (336) 832-4400   °[facility] ° [address], [place] (336) 992-4800   °[facility]/Dr. Osei-Bonsu ° [address] [place] [address], [place] or [address], [place] (336) [phone] Phone number for both [place] and [place] locations is the same.  °[facility] [address], [place] (336) 299-0000   °[facility] [address] [place] [address], [place] or [address] (336) 852-7530 °(336) 878-2260   °[facility] [address], [place] (336) 350-1642, phone; (336) 294-5005, fax Sees patients 1st and 3rd Saturday of every month.  Must not qualify for public or private insurance (i.e. Medicaid, Medicare, [place] Health Choice, Veterans' Benefits) • Household income should be no more than 200% of the poverty level •The clinic cannot treat you if you are pregnant or think you are pregnant • Sexually transmitted diseases are not treated at the clinic.  ° ° °Dental Care: °Organization         Address  Phone  Notes  °[place] [facility] [address], [place] (336) 641-6152 Accepts children up to age 21 who are enrolled in Medicaid or [place] Health Choice; pregnant women with a Medicaid card; and children who have applied for Medicaid or [place] Health Choice, but were declined, whose parents can pay a reduced fee at time of service.  °[place]  [facility] [place]  [address], [place] (336) 641-7733 Accepts children up to age 21 who are enrolled in Medicaid or [place] Health Choice; pregnant women with a Medicaid card; and children who have applied for Medicaid or [place] Health Choice, but were declined, whose parents can pay a reduced fee at time of service.  °[facility] ° [address], [place] (336) 641-4533 Patients are seen by appointment only. Walk-ins are not accepted. [facility] will see patients 18 years of age and older. °Monday - Tuesday (8am-5pm) °Most Wednesdays (8:30-5pm) °$30 per visit, cash only  °[facility] ° [address], [place] (336) 641-4533 Patients are seen by appointment only. Walk-ins are not accepted. [facility] will see patients 18 years of age and older. °One   Wednesday Evening (Monthly: Volunteer Based).  $30 per visit, cash only  °[facility]  (919) 537-3737 for adults; Children under age 4, call Graduate Pediatric Dentistry at ([phone]) [phone]. Children aged 4-14, please call (919) [phone] to request a pediatric application. ° Dental services are provided in all areas of dental care including fillings, crowns and bridges, complete and partial dentures, implants, gum treatment, root canals, and extractions. Preventive care is also provided. Treatment is provided to both adults and children. °Patients are selected via a lottery and there is often a waiting list. °  °[facility] [address], °[place] ° (336) 763-8833 [email or web address].[email or web address] °  °[facility] [address], [place], [place] (336)723-1848, Ext. 123 Second and Fourth Thursday of each month, opens at 6:30 AM; Clinic ends at 9 AM.  Patients are seen on a first-come first-served basis, and a limited number are seen during each clinic.  ° °[facility] ° [address], [place], [place] (336) 723-7904    Eligibility Requirements °You must have lived in [place], or [place] counties for at least the last three months. °  You cannot be eligible for state or federal sponsored healthcare insurance, including Veterans Administration, Medicaid, or Medicare. °  You generally cannot be eligible for healthcare insurance through your employer.  °  How to apply: °Eligibility screenings are held every Tuesday and Wednesday afternoon from 1:00 pm until 4:00 pm. You do not need an appointment for the interview!  °[facility] [address], [place], [place] 336-631-2330   °[facility]  336-342-8273   °[facility]  336-703-3100   °[facility]  336-570-6415   ° °Behavioral Health Resources in the Community: °Intensive Outpatient Programs °Organization         Address  Phone  Notes  °[facility] [address], [place], [place] 336-878-6098   °[facility] [address], [place], [place] 336-832-9800   °[facility] [address], [place], [place] ° 336-882-2125   °[facility] [address],  °[place], [place] 1-800-853-5163 or 336-641-4981   °Substance Abuse Resources °Organization         Address  Phone  Notes  °[facility]  336-882-2125   °[facility]  336-784-9470   °[facility]  336-285-9073   °[facility]  336-845-3988   °Residential & Outpatient Substance Abuse Program  1-800-659-3381   °Psychological Services °Organization         Address  Phone  Notes  °[facility]  336- 832-9600   °[facility]  336- 378-7881   °[facility] [address], [place] 1-800-853-5163 or 336-641-4981   ° °Mobile Crisis Teams °Organization         Address  Phone  Notes  °[facility], Mobile Crisis Care Unit  1-877-626-1772   °Assertive °[facility] ° [address]  [place], [place] 336-834-9664   °Sharon DeEsch [address] °[place] 336-554-5454   ° °Self-Help/Support Groups °Organization         Address  Phone             Notes  °[facility]  - variety of support groups  336- 373-1402 Call for more information  °Narcotics Anonymous (NA), [facility] [address], °[place]  2 meetings at this location  ° °  Residential Treatment Programs Organization         Address  Phone  Notes  [facility] [address]    [place]  6-314-970-2637   [facility]  [address] [place] [address], [place], [place] 277-412-8786   [facility] [address] [place], [place] 767-209-4709 Admissions: 8am-3pm M-F  [facility] [address]    [place], [place] 628-366-2947   [facility] [address] [place], [place], [place] 654-650-3546   [facility] [address],  [place], [place] [phone]   [facility] Intensive Outpatient [address], [place], [place] [phone]   [facility] ([facility] [address]  [place], [place] 5-916-384-6659 or (715)807-1859   [facility] [address] [place], [place] 903-009-2330 Accepts Medicaid  [facility] [address],  [place] 0-762-263-3354 Substance Abuse/Addiction Treatment   [facility]         Address  Phone  Notes  [facility]  720-339-7326   Angie Fava, PhD [address] [place], [place]   4794492787 or 207-571-7378   [facility]   [address] [place], [place] 254-647-1238   [facility] [address] [place], [place] 313-810-6385 Insurance/Medicaid/sponsorship through [facility] and Families [address]                                    [place], [place] (250) [phone] Therapy/tele-psych/case    [facility] [address], [place] ([phone]    Dr. Lolly Mustache  (570)[phone]   [facility]  [facility] Dept. 1) [address], [place] 2) [address], [place] 3)  [address], [place] [phone]) [phone]  [phone]   [facility] Child Abuse Hotline 470-740-8431 or 302-455-0810 (After Hours)      Take your usual prescriptions as previously directed.  Apply moist heat or ice to the area(s) of discomfort, for 15 minutes at a time, several times per day for the next few days.  Do not fall asleep on a heating or ice pack.  Call your regular medical doctor, your Pain Management doctor, and your mental health provider tomorrow morning schedule a follow up appointment in the next 3 days.  Return to the Emergency Department immediately if worsening.

## 2015-06-13 NOTE — ED Provider Notes (Signed)
CSN: [id number]     Arrival date & time 06/13/15  1859 History   First MD Initiated Contact with Patient 06/13/15 1913     Chief Complaint  Patient presents with  . Altered Mental Status     HPI  Pt was seen at 1920. Per EMS, pt's family and pt report: Pt c/o gradual onset and persistence of constant agitation that began PTA. Pt states she "got angry" and "left the house to walk to [place]." States she "just wants my pain medicines and go home." Pt states she was brought to the ED today "because everyone thinks I'm crazy." Denies any other complaints other than her usual chronic knees pain. The symptoms have been associated with no other complaints. The patient has a significant history of similar symptoms previously, recently being evaluated for this complaint and multiple prior evals for same with most recent ED visit 1 week ago.    Past Medical History  Diagnosis Date  . Arthritis   . Bronchitis   . Hypertension   . Chronic back pain   . Chronic knee pain   . COPD (chronic obstructive pulmonary disease)   . Memory difficulty 2015  . Weakness of both legs   . 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 Knee surgery  2007  . Appendectomy  1971  . Tonsillectomy  1971   Family History  Problem Relation Age of Onset  . Heart failure Father   . Diabetes Father   . Kidney failure Father    Social History  Substance Use Topics  . Smoking status: Light Tobacco Smoker -- 0.50 packs/day for 10 years    Types: Cigarettes  . Smokeless tobacco: None     Comment: 04/30/15 less than 1 PPD  . Alcohol Use: 0.0 oz/week    0 Standard drinks or equivalent per week     Comment: Very rare   OB History    Gravida Para Term Preterm AB TAB SAB Ectopic Multiple Living   Review of Systems ROS: Statement: All systems negative except as marked or noted in the HPI;  Constitutional: Negative for fever and chills. ; ; Eyes: Negative for eye pain, redness and discharge. ; ; ENMT: Negative for ear pain, hoarseness, nasal congestion, sinus pressure and sore throat. ; ; Cardiovascular: Negative for chest pain, palpitations, diaphoresis, dyspnea and peripheral edema. ; ; Respiratory: Negative for cough, wheezing and stridor. ; ; Gastrointestinal: Negative for nausea, vomiting, diarrhea, abdominal pain, blood in stool, hematemesis, jaundice and rectal bleeding. . ; ; Genitourinary: Negative for dysuria, flank pain and hematuria. ; ; Musculoskeletal: +chronic pain. Negative for back pain and neck pain. Negative for swelling and trauma.; ; Skin: Negative for pruritus, rash, abrasions, blisters, bruising and skin lesion.; ; Neuro: Negative for headache, lightheadedness and neck stiffness. Negative for weakness, altered level of consciousness , extremity weakness, paresthesias, involuntary movement, seizure and syncope.; Psych:  +agitation. No SI, no SA, no HI, no hallucinations.     Allergies  Bee venom; Amitriptyline; Mushroom extract complex; Toradol; Tramadol; and Trazodone and nefazodone  Home Medications   Prior to Admission medications   Medication Sig Start Date End Date Taking? Authorizing Provider  albuterol (PROVENTIL HFA;VENTOLIN HFA) 108 (90 BASE) MCG/ACT inhaler Inhale 2 puffs into the lungs every 6 (six) hours as needed for wheezing.  06/21/13   Nimish Normajean [place], MD  amLODipine (NORVASC) 2.5 MG tablet Take 2.5 mg by mouth daily.    Historical Provider, MD  cyclobenzaprine (FLEXERIL) 10 MG tablet Take 10 mg by mouth 3 (three) times daily as needed for muscle spasms.    Historical Provider, MD  diazepam (VALIUM) 5 MG tablet Take 5 mg by mouth 3 (three) times daily as needed for anxiety.    Historical Provider, MD  diphenhydrAMINE (BENADRYL) 25 MG tablet Take 2 tablets (50 mg total) by mouth every 4 (four) hours as needed for itching. 12/17/13   Wayland [place], MD   DULoxetine (CYMBALTA) 60 MG capsule Take 60 mg by mouth daily.    Historical Provider, MD  EVZIO 0.4 MG/0.4ML SOAJ Inject 0.4 mLs into the muscle once as needed (for Anaphylaxis/allergic reaction).  02/19/15   Historical Provider, MD  gabapentin (NEURONTIN) 300 MG capsule Take 300 mg by mouth 3 (three) times daily.    Historical Provider, MD  HYDROcodone-acetaminophen (NORCO) 10-325 MG per tablet Take 1 tablet by mouth every 6 (six) hours as needed for moderate pain or severe pain.    Historical Provider, MD  hydrOXYzine (VISTARIL) 25 MG capsule Take 25 mg by mouth at bedtime.    Historical Provider, MD  metFORMIN (GLUCOPHAGE-XR) 500 MG 24 hr tablet Take 500 mg by mouth at bedtime.  12/15/14   Historical Provider, MD  metoprolol succinate (TOPROL-XL) 50 MG 24 hr tablet Take 50 mg by mouth at bedtime. Take with or immediately following a meal.    Historical Provider, MD  naproxen (NAPROSYN) 500 MG tablet Take 1 tablet (500 mg total) by mouth 2 (two) times daily with a meal. Patient not taking: Reported on 05/16/2015 05/03/15   Eber Hong, MD  VOLTAREN 1 % GEL Apply 4 g topically every 8 (eight) hours.  02/06/15   Historical Provider, MD   BP 155/87 mmHg  Pulse 76  Temp(Src) 98.3 F (36.8 C) (Oral)  Resp 20  Ht 5\' 4"  (1.626 m)  Wt 225 lb (102.059 kg)  BMI 38.60 kg/m2  SpO2 97% Physical Exam  1925: Physical examination:  Nursing notes reviewed; Vital signs and O2 SAT reviewed;  Constitutional: Well developed, Well nourished, Well hydrated, In no acute distress; Head:  Normocephalic, atraumatic; Eyes: EOMI, PERRL, No scleral icterus; ENMT: Mouth and pharynx normal, Mucous membranes moist; Neck: Supple, Full range of motion, No lymphadenopathy; Cardiovascular: Regular rate and rhythm, No gallop; Respiratory: Breath sounds clear & equal bilaterally, No wheezes.  Speaking full sentences with ease, Normal respiratory effort/excursion; Chest: Nontender, Movement normal; Abdomen: Soft, Nontender,  Nondistended, Normal bowel sounds; Genitourinary: No CVA tenderness; Extremities: Pulses normal, No deformity. No rash, no tenderness, No edema, No calf edema or asymmetry.; Neuro: Awake, alert. Keeps repeating she "just wants my pain medicines and go home." Major CN grossly intact. No facial droop. Speech clear. Moves all extremities on stretcher spontaneously and to command without apparent gross focal motor deficits.; Skin: Color normal, Warm, Dry.; Psych:  Does not appear to be responding to internal stimuli.     ED Course  Procedures (including critical care time) Labs Review   Imaging Review  I have personally reviewed and evaluated these images and lab results as part of my medical decision-making.   EKG Interpretation None      MDM  MDM Reviewed: nursing note, previous chart and vitals Reviewed previous: labs Interpretation: labs   Results for orders placed or performed during the hospital encounter of 06/13/15  Urinalysis,  Routine w reflex microscopic (not at [facility])  Result Value Ref Range   Color, Urine YELLOW YELLOW   APPearance CLEAR CLEAR   Specific Gravity, Urine 1.025 1.005 - 1.030   pH 5.5 5.0 - 8.0   Glucose, UA NEGATIVE NEGATIVE mg/dL   Hgb urine dipstick TRACE (A) NEGATIVE   Bilirubin Urine NEGATIVE NEGATIVE   Ketones, ur NEGATIVE NEGATIVE mg/dL   Protein, ur NEGATIVE NEGATIVE mg/dL   Urobilinogen, UA 0.2 0.0 - 1.0 mg/dL   Nitrite NEGATIVE NEGATIVE   Leukocytes, UA NEGATIVE NEGATIVE  Urine rapid drug screen (hosp performed)  Result Value Ref Range   Opiates POSITIVE (A) NONE DETECTED   Cocaine NONE DETECTED NONE DETECTED   Benzodiazepines POSITIVE (A) NONE DETECTED   Amphetamines NONE DETECTED NONE DETECTED   Tetrahydrocannabinol NONE DETECTED NONE DETECTED   Barbiturates NONE DETECTED NONE DETECTED  Basic metabolic panel  Result Value Ref Range   Sodium 136 135 - 145 mmol/L   Potassium 4.1 3.5 - 5.1 mmol/L   Chloride 98 (L) 101 - 111 mmol/L    CO2 29 22 - 32 mmol/L   Glucose, Bld 95 65 - 99 mg/dL   BUN 14 6 - 20 mg/dL   Creatinine, Ser 1.61 0.44 - 1.00 mg/dL   Calcium 9.2 8.9 - 09.6 mg/dL   GFR calc non Af Amer >60 >60 mL/min   GFR calc Af Amer >60 >60 mL/min   Anion gap 9 5 - 15  CBC with Differential  Result Value Ref Range   WBC 11.4 (H) 4.0 - 10.5 K/uL   RBC 5.57 (H) 3.87 - 5.11 MIL/uL   Hemoglobin 17.1 (H) 12.0 - 15.0 g/dL   HCT 04.5 (H) 40.9 - 81.1 %   MCV 92.6 78.0 - 100.0 fL   MCH 30.7 26.0 - 34.0 pg   MCHC 33.1 30.0 - 36.0 g/dL   RDW 91.4 78.2 - 95.6 %   Platelets 245 150 - 400 K/uL   Neutrophils Relative % 42 (L) 43 - 77 %   Neutro Abs 4.7 1.7 - 7.7 K/uL   Lymphocytes Relative 49 (H) 12 - 46 %   Lymphs Abs 5.6 (H) 0.7 - 4.0 K/uL   Monocytes Relative 6 3 - 12 %   Monocytes Absolute 0.7 0.1 - 1.0 K/uL   Eosinophils Relative 3 0 - 5 %   Eosinophils Absolute 0.3 0.0 - 0.7 K/uL   Basophils Relative 0 0 - 1 %   Basophils Absolute 0.1 0.0 - 0.1 K/uL  Ethanol  Result Value Ref Range   Alcohol, Ethyl (B) <5 <5 mg/dL  Urine microscopic-add on  Result Value Ref Range   Squamous Epithelial / LPF FEW (A) RARE   WBC, UA 0-2 <3 WBC/hpf   RBC / HPF 0-2 <3 RBC/hpf   Bacteria, UA RARE RARE    2045:  Pt seen in ED multiple times for chronic pain. Pt here today because she "left home angry." Pt keeps repeating she "just wants my pain medicine and go home." ED RN states pt was "disoriented" but pt will quickly become agitated and oriented whenever her chronic pain or narcotic pain medications are the topic of conversation.  EPIC chart reviewed: pt has multiple Neuro MD notes (most recent last month) noting memory difficulties dating back to 2015, "pseudodementia" likely r/t stress/depression/pain, with recommendations to f/u with Psych MD. Labs reassuring. Will have TTS eval.   2200:  Pt was evaluated by TTS: pt denies SI/HI, no psychosis, pt does  not meet inpatient criteria at this time and can be d/c to f/u with her  psychiatrist. No medical indication for admission at this time. Pt encouraged to f/u with her PMD, Psychiatrist, and Pain Management doctor for good continuity of care and control of her chronic medical conditions and her chronic pain.  Family verb understanding.      Samuel Jester, DO 06/17/15 [phone]

## 2015-06-13 NOTE — ED Notes (Signed)
Patient tearful. Patient states "I was sent here because everyone thinks I'm crazy. It's not me its everyone else."

## 2015-06-13 NOTE — ED Notes (Addendum)
Patient brought in by EMS with altered mental status. Per EMS, family states patient tried to leave home angry and stated she was going to walk home to [place]. Patient does not currently live in [place]. Patient disoriented to date, time, and situation. Complaining of pain in bilateral knees and back. Per EMS, patient has daughter at home, but patient states "I don't have any daughters" at triage.

## 2015-06-13 NOTE — ED Notes (Signed)
MD at bedside. 

## 2015-06-13 NOTE — [facility] Counselor (Signed)
Disposition: Per Janann August, NP, pt does not meet inpt tx criteria. Pt can be d/c to follow-up with her psychiatrist, Dr. Jannifer Franklin. Pt not deemed to be a risk to self or others at this time.  Dr Clarene Duke made aware of disposition.    Cyndie Mull, MS, [facility] Therapeutic Triage [facility]  Ext [phone]

## 2015-06-13 NOTE — ED Notes (Signed)
TTS consult in progress at this time. Family at bedside.

## 2015-06-13 NOTE — ED Notes (Signed)
Instructed patient not to drive. Verbalizes understanding. 

## 2015-06-23 ENCOUNTER — Encounter (HOSPITAL_COMMUNITY): Payer: Self-pay | Admitting: Emergency Medicine

## 2015-06-23 ENCOUNTER — Emergency Department (HOSPITAL_COMMUNITY)
Admission: EM | Admit: 2015-06-23 | Discharge: 2015-06-23 | Disposition: A | Discharge status: Home / Self Care | Payer: Medicare Other | Attending: Emergency Medicine | Admitting: Emergency Medicine

## 2015-06-23 DIAGNOSIS — M5136 Other intervertebral disc degeneration, lumbar region: Secondary | ICD-10-CM | POA: Insufficient documentation

## 2015-06-23 DIAGNOSIS — F6811 Factitious disorder with predominantly psychological signs and symptoms: Secondary | ICD-10-CM | POA: Diagnosis not present

## 2015-06-23 DIAGNOSIS — I10 Essential (primary) hypertension: Secondary | ICD-10-CM | POA: Diagnosis not present

## 2015-06-23 DIAGNOSIS — M51369 Other intervertebral disc degeneration, lumbar region without mention of lumbar back pain or lower extremity pain: Secondary | ICD-10-CM

## 2015-06-23 DIAGNOSIS — J449 Chronic obstructive pulmonary disease, unspecified: Secondary | ICD-10-CM | POA: Diagnosis not present

## 2015-06-23 DIAGNOSIS — M5416 Radiculopathy, lumbar region: Secondary | ICD-10-CM | POA: Insufficient documentation

## 2015-06-23 DIAGNOSIS — G8929 Other chronic pain: Secondary | ICD-10-CM | POA: Diagnosis not present

## 2015-06-23 DIAGNOSIS — Z720 Tobacco use: Secondary | ICD-10-CM | POA: Diagnosis not present

## 2015-06-23 DIAGNOSIS — M1990 Unspecified osteoarthritis, unspecified site: Secondary | ICD-10-CM | POA: Insufficient documentation

## 2015-06-23 DIAGNOSIS — M545 Low back pain: Secondary | ICD-10-CM | POA: Diagnosis present

## 2015-06-23 DIAGNOSIS — Z79899 Other long term (current) drug therapy: Secondary | ICD-10-CM | POA: Insufficient documentation

## 2015-06-23 MED ORDER — DIAZEPAM 5 MG PO TABS
5.0000 mg | ORAL_TABLET | Freq: Once | ORAL | Status: AC
  Administered 2015-06-23: 5 mg via ORAL
  Filled 2015-06-23: qty 1

## 2015-06-23 MED ORDER — MORPHINE SULFATE (PF) 4 MG/ML IV SOLN
8.0000 mg | Freq: Once | INTRAVENOUS | Status: AC
  Administered 2015-06-23: 8 mg via INTRAMUSCULAR
  Filled 2015-06-23: qty 2

## 2015-06-23 MED ORDER — DEXAMETHASONE SODIUM PHOSPHATE 4 MG/ML IJ SOLN
8.0000 mg | Freq: Once | INTRAMUSCULAR | Status: AC
  Administered 2015-06-23: 8 mg via INTRAMUSCULAR
  Filled 2015-06-23: qty 2

## 2015-06-23 MED ORDER — ONDANSETRON HCL 4 MG PO TABS
4.0000 mg | ORAL_TABLET | Freq: Once | ORAL | Status: AC
  Administered 2015-06-23: 4 mg via ORAL
  Filled 2015-06-23: qty 1

## 2015-06-23 NOTE — ED Notes (Signed)
  Patient reports of riding all day and now having lower back pain that radiates down bil legs. Reports of tingling at base of spine. Denies injury.

## 2015-06-23 NOTE — ED Provider Notes (Signed)
CSN: [id number]     Arrival date & time 06/23/15  1603 History   First MD Initiated Contact with Patient 06/23/15 1627     Chief Complaint  Patient presents with  . Back Pain     (Consider location/radiation/quality/duration/timing/severity/associated sxs/prior Treatment) Patient is a 64 y.o. female presenting with back pain. The history is provided by the patient.  Back Pain Location:  Lumbar spine Quality:  Burning Radiates to:  L thigh and R thigh Pain severity:  Moderate Onset quality:  Gradual Timing:  Intermittent Progression:  Worsening Chronicity:  Chronic Context comment:  History of degenerative changes of the back. Relieved by:  Nothing Worsened by:  Sitting (riding) Associated symptoms: no abdominal pain, no bladder incontinence, no bowel incontinence, no chest pain and no dysuria   Risk factors: no recent surgery     Past Medical History  Diagnosis Date  . Arthritis   . Bronchitis   . Hypertension   . Chronic back pain   . Chronic knee pain   . COPD (chronic obstructive pulmonary disease)   . Memory difficulty 2015  . Weakness of both legs   . 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 Knee surgery  2007  . Appendectomy  1971  . Tonsillectomy  1971   Family History  Problem Relation Age of Onset  . Heart failure Father   . Diabetes Father   . Kidney failure Father    Social History  Substance Use Topics  . Smoking status: Light Tobacco Smoker -- 0.50 packs/day for 10 years    Types: Cigarettes  . Smokeless tobacco: None     Comment: 04/30/15 less than 1 PPD  . Alcohol Use: 0.0 oz/week    0 Standard drinks or equivalent per week     Comment: Very rare   OB History    Gravida Para Term Preterm AB TAB SAB Ectopic Multiple Living   2 2 2             Review of Systems  Constitutional: Negative for activity change.       All ROS Neg  except as noted in HPI  HENT: Negative for nosebleeds.   Eyes: Negative for photophobia and discharge.  Respiratory: Negative for cough, shortness of breath and wheezing.   Cardiovascular: Negative for chest pain and palpitations.  Gastrointestinal: Negative for abdominal pain, blood in stool and bowel incontinence.  Genitourinary: Negative for bladder incontinence, dysuria, frequency and hematuria.  Musculoskeletal: Positive for back pain and arthralgias. Negative for neck pain.  Skin: Negative.   Neurological: Negative for dizziness, seizures and speech difficulty.  Psychiatric/Behavioral: Negative for hallucinations and confusion.      Allergies  Bee venom; Mushroom extract complex; Amitriptyline; Toradol; Tramadol; and Trazodone and nefazodone  Home Medications   Prior to Admission medications   Medication Sig Start Date End Date Taking? Authorizing Provider  albuterol (PROVENTIL HFA;VENTOLIN HFA) 108 (90 BASE) MCG/ACT inhaler Inhale 2 puffs into the lungs every 6 (six) hours as needed for wheezing. 06/21/13  Yes Nimish C Karilyn Cota, MD  amLODipine (NORVASC) 2.5 MG tablet Take 2.5 mg by mouth daily.   Yes Historical Provider, MD  cyclobenzaprine (FLEXERIL) 10 MG tablet Take 10 mg by mouth 3 (three) times daily as needed for muscle spasms.   Yes Historical Provider, MD  diazepam (VALIUM) 5 MG tablet Take 5 mg by mouth 3 (three) times daily as  needed for anxiety.   Yes Historical Provider, MD  diphenhydrAMINE (BENADRYL) 25 MG tablet Take 2 tablets (50 mg total) by mouth every 4 (four) hours as needed for itching. 12/17/13  Yes Wayland Salinas, MD  DULoxetine (CYMBALTA) 60 MG capsule Take 60 mg by mouth at bedtime.    Yes Historical Provider, MD  gabapentin (NEURONTIN) 300 MG capsule Take 300 mg by mouth 3 (three) times daily.   Yes Historical Provider, MD  HYDROcodone-acetaminophen (NORCO) 10-325 MG per tablet Take 1 tablet by mouth every 6 (six) hours as needed for moderate pain or severe pain.    Yes Historical Provider, MD  hydrOXYzine (VISTARIL) 25 MG capsule Take 25 mg by mouth at bedtime.   Yes Historical Provider, MD  metFORMIN (GLUCOPHAGE-XR) 500 MG 24 hr tablet Take 500 mg by mouth at bedtime.  12/15/14  Yes Historical Provider, MD  metoprolol succinate (TOPROL-XL) 50 MG 24 hr tablet Take 50 mg by mouth at bedtime. Take with or immediately following a meal.   Yes Historical Provider, MD  VOLTAREN 1 % GEL Apply 4 g topically every 8 (eight) hours.  02/06/15  Yes Historical Provider, MD  EVZIO 0.4 MG/0.4ML SOAJ Inject 0.4 mLs into the muscle once as needed (for Anaphylaxis/allergic reaction).  02/19/15   Historical Provider, MD  naproxen (NAPROSYN) 500 MG tablet Take 1 tablet (500 mg total) by mouth 2 (two) times daily with a meal. Patient not taking: Reported on 05/16/2015 05/03/15   Eber Hong, MD   BP 150/85 mmHg  Pulse 86  Temp(Src) 98.1 F (36.7 C)  Resp 18  Ht 5\' 4"  (1.626 m)  Wt 225 lb (102.059 kg)  BMI 38.60 kg/m2  SpO2 96% Physical Exam  Constitutional: She is oriented to person, place, and time. She appears well-developed and well-nourished.  Non-toxic appearance.  HENT:  Head: Normocephalic.  Right Ear: Tympanic membrane and external ear normal.  Left Ear: Tympanic membrane and external ear normal.  Eyes: EOM and lids are normal. Pupils are equal, round, and reactive to light.  Neck: Normal range of motion. Neck supple. Carotid bruit is not present.  Cardiovascular: Normal rate, regular rhythm, normal heart sounds, intact distal pulses and normal pulses.   Pulmonary/Chest: Breath sounds normal. No respiratory distress.  Abdominal: Soft. Bowel sounds are normal. There is no tenderness. There is no guarding.  Musculoskeletal:       Lumbar back: She exhibits decreased range of motion, tenderness, pain and spasm.       Back:  Lymphadenopathy:       Head (right side): No submandibular adenopathy present.       Head (left side): No submandibular adenopathy present.     She has no cervical adenopathy.  Neurological: She is alert and oriented to person, place, and time. She has normal strength. No cranial nerve deficit or sensory deficit.  Skin: Skin is warm and dry.  Psychiatric: She has a normal mood and affect. Her speech is normal.  Nursing note and vitals reviewed.   ED Course  Procedures (including critical care time) Labs Review Labs Reviewed - No data to display  Imaging Review No results found. I have personally reviewed and evaluated these images and lab results as part of my medical decision-making.   EKG Interpretation None      MDM  Vital signs reviewed. Patient treated with intramuscular morphine and Decadron in the emergency department. Patient received some improvement of her pain. Discussed pain management as an outpatient with the patient.  she states that she understands the discharge instructions, and will follow with her primary physician.    Final diagnoses:  None    *I have reviewed nursing notes, vital signs, and all appropriate lab and imaging results for this patient.[address], PA-C 06/25/15 1610  Vanetta Mulders, MD 06/27/15 1652  Vanetta Mulders, MD 06/27/15 [phone]

## 2015-06-23 NOTE — ED Notes (Signed)
Pt states her family had her on the road all day, after getting home and putting up groceries, she felt a tingling in spine and fell. Complaining of worse back pain

## 2015-06-23 NOTE — Discharge Instructions (Signed)
You were treated today with intramuscular necrotic pain medication, and a muscle relaxing medication. Please use caution getting around. Please contact Dr. Loney Hering, or your pain management specialist to discuss breakthrough pain events. Radicular Pain Radicular pain in either the arm or leg is usually from a bulging or herniated disk in the spine. A piece of the herniated disk may press against the nerves as the nerves exit the spine. This causes pain which is felt at the tips of the nerves down the arm or leg. Other causes of radicular pain may include:  Fractures.  Heart disease.  Cancer.  An abnormal and usually degenerative state of the nervous system or nerves (neuropathy). Diagnosis may require CT or MRI scanning to determine the primary cause.  Nerves that start at the neck (nerve roots) may cause radicular pain in the outer shoulder and arm. It can spread down to the thumb and fingers. The symptoms vary depending on which nerve root has been affected. In most cases radicular pain improves with conservative treatment. Neck problems may require physical therapy, a neck collar, or cervical traction. Treatment may take many weeks, and surgery may be considered if the symptoms do not improve.  Conservative treatment is also recommended for sciatica. Sciatica causes pain to radiate from the lower back or buttock area down the leg into the foot. Often there is a history of back problems. Most patients with sciatica are better after 2 to 4 weeks of rest and other supportive care. Short term bed rest can reduce the disk pressure considerably. Sitting, however, is not a good position since this increases the pressure on the disk. You should avoid bending, lifting, and all other activities which make the problem worse. Traction can be used in severe cases. Surgery is usually reserved for patients who do not improve within the first months of treatment. Only take over-the-counter or prescription medicines  for pain, discomfort, or fever as directed by your caregiver. Narcotics and muscle relaxants may help by relieving more severe pain and spasm and by providing mild sedation. Cold or massage can give significant relief. Spinal manipulation is not recommended. It can increase the degree of disc protrusion. Epidural steroid injections are often effective treatment for radicular pain. These injections deliver medicine to the spinal nerve in the space between the protective covering of the spinal cord and back bones (vertebrae). Your caregiver can give you more information about steroid injections. These injections are most effective when given within two weeks of the onset of pain.  You should see your caregiver for follow up care as recommended. A program for neck and back injury rehabilitation with stretching and strengthening exercises is an important part of management.  SEEK IMMEDIATE MEDICAL CARE IF:  You develop increased pain, weakness, or numbness in your arm or leg.  You develop difficulty with bladder or bowel control.  You develop abdominal pain. Document Released: 11/06/2004 Document Revised: 12/22/2011 Document Reviewed: 01/22/2009 [facility] Patient Information 2015 [place], [place]. This information is not intended to replace advice given to you by your health care provider. Make sure you discuss any questions you have with your health care provider.

## 2015-06-28 ENCOUNTER — Emergency Department (HOSPITAL_COMMUNITY): Payer: Medicare Other

## 2015-06-28 ENCOUNTER — Observation Stay (HOSPITAL_COMMUNITY)
Admission: EM | Admit: 2015-06-28 | Discharge: 2015-06-30 | Disposition: A | Discharge status: Home / Self Care | Payer: Medicare Other | Attending: Internal Medicine | Admitting: Internal Medicine

## 2015-06-28 ENCOUNTER — Encounter (HOSPITAL_COMMUNITY): Payer: Self-pay | Admitting: *Deleted

## 2015-06-28 DIAGNOSIS — R0789 Other chest pain: Secondary | ICD-10-CM | POA: Diagnosis not present

## 2015-06-28 DIAGNOSIS — F419 Anxiety disorder, unspecified: Secondary | ICD-10-CM | POA: Diagnosis not present

## 2015-06-28 DIAGNOSIS — F329 Major depressive disorder, single episode, unspecified: Secondary | ICD-10-CM | POA: Diagnosis not present

## 2015-06-28 DIAGNOSIS — Z885 Allergy status to narcotic agent status: Secondary | ICD-10-CM | POA: Diagnosis not present

## 2015-06-28 DIAGNOSIS — R55 Syncope and collapse: Principal | ICD-10-CM | POA: Diagnosis present

## 2015-06-28 DIAGNOSIS — W010XXA Fall on same level from slipping, tripping and stumbling without subsequent striking against object, initial encounter: Secondary | ICD-10-CM | POA: Insufficient documentation

## 2015-06-28 DIAGNOSIS — R296 Repeated falls: Secondary | ICD-10-CM | POA: Diagnosis not present

## 2015-06-28 DIAGNOSIS — R410 Disorientation, unspecified: Secondary | ICD-10-CM | POA: Insufficient documentation

## 2015-06-28 DIAGNOSIS — R002 Palpitations: Secondary | ICD-10-CM | POA: Diagnosis not present

## 2015-06-28 DIAGNOSIS — D72829 Elevated white blood cell count, unspecified: Secondary | ICD-10-CM | POA: Diagnosis not present

## 2015-06-28 DIAGNOSIS — M47816 Spondylosis without myelopathy or radiculopathy, lumbar region: Secondary | ICD-10-CM | POA: Insufficient documentation

## 2015-06-28 DIAGNOSIS — Y998 Other external cause status: Secondary | ICD-10-CM | POA: Insufficient documentation

## 2015-06-28 DIAGNOSIS — M4806 Spinal stenosis, lumbar region: Secondary | ICD-10-CM | POA: Diagnosis not present

## 2015-06-28 DIAGNOSIS — R001 Bradycardia, unspecified: Secondary | ICD-10-CM | POA: Diagnosis not present

## 2015-06-28 DIAGNOSIS — E1142 Type 2 diabetes mellitus with diabetic polyneuropathy: Secondary | ICD-10-CM | POA: Insufficient documentation

## 2015-06-28 DIAGNOSIS — Z91030 Bee allergy status: Secondary | ICD-10-CM | POA: Diagnosis not present

## 2015-06-28 DIAGNOSIS — Z91018 Allergy to other foods: Secondary | ICD-10-CM | POA: Insufficient documentation

## 2015-06-28 DIAGNOSIS — M170 Bilateral primary osteoarthritis of knee: Secondary | ICD-10-CM | POA: Insufficient documentation

## 2015-06-28 DIAGNOSIS — Y92010 Kitchen of single-family (private) house as the place of occurrence of the external cause: Secondary | ICD-10-CM | POA: Insufficient documentation

## 2015-06-28 DIAGNOSIS — Z79899 Other long term (current) drug therapy: Secondary | ICD-10-CM | POA: Insufficient documentation

## 2015-06-28 DIAGNOSIS — I700 Atherosclerosis of aorta: Secondary | ICD-10-CM | POA: Diagnosis not present

## 2015-06-28 DIAGNOSIS — Y9389 Activity, other specified: Secondary | ICD-10-CM | POA: Diagnosis not present

## 2015-06-28 DIAGNOSIS — M6281 Muscle weakness (generalized): Secondary | ICD-10-CM | POA: Insufficient documentation

## 2015-06-28 DIAGNOSIS — M48062 Spinal stenosis, lumbar region with neurogenic claudication: Secondary | ICD-10-CM | POA: Diagnosis present

## 2015-06-28 DIAGNOSIS — D751 Secondary polycythemia: Secondary | ICD-10-CM | POA: Insufficient documentation

## 2015-06-28 DIAGNOSIS — K219 Gastro-esophageal reflux disease without esophagitis: Secondary | ICD-10-CM | POA: Insufficient documentation

## 2015-06-28 DIAGNOSIS — I10 Essential (primary) hypertension: Secondary | ICD-10-CM | POA: Diagnosis present

## 2015-06-28 DIAGNOSIS — R51 Headache: Secondary | ICD-10-CM | POA: Insufficient documentation

## 2015-06-28 DIAGNOSIS — R29898 Other symptoms and signs involving the musculoskeletal system: Secondary | ICD-10-CM

## 2015-06-28 DIAGNOSIS — F17210 Nicotine dependence, cigarettes, uncomplicated: Secondary | ICD-10-CM | POA: Diagnosis not present

## 2015-06-28 DIAGNOSIS — Z888 Allergy status to other drugs, medicaments and biological substances status: Secondary | ICD-10-CM | POA: Diagnosis not present

## 2015-06-28 DIAGNOSIS — R262 Difficulty in walking, not elsewhere classified: Secondary | ICD-10-CM

## 2015-06-28 DIAGNOSIS — J449 Chronic obstructive pulmonary disease, unspecified: Secondary | ICD-10-CM | POA: Diagnosis not present

## 2015-06-28 LAB — COMPREHENSIVE METABOLIC PANEL
ALT: 25 U/L (ref 14–54)
ANION GAP: 8 (ref 5–15)
AST: 26 U/L (ref 15–41)
Albumin: 3.4 g/dL — ABNORMAL LOW (ref 3.5–5.0)
Alkaline Phosphatase: 105 U/L (ref 38–126)
BUN: 14 mg/dL (ref 6–20)
CALCIUM: 9 mg/dL (ref 8.9–10.3)
CO2: 26 mmol/L (ref 22–32)
CREATININE: 0.63 mg/dL (ref 0.44–1.00)
Chloride: 100 mmol/L — ABNORMAL LOW (ref 101–111)
GFR calc Af Amer: >60 mL/min (ref >60)
GFR calc non Af Amer: >60 mL/min (ref >60)
GLUCOSE: 95 mg/dL (ref 65–99)
Potassium: 3.7 mmol/L (ref 3.5–5.1)
Sodium: 134 mmol/L — ABNORMAL LOW (ref 135–145)
Total Bilirubin: 0.2 mg/dL — ABNORMAL LOW (ref 0.3–1.2)
Total Protein: 6.2 g/dL — ABNORMAL LOW (ref 6.5–8.1)

## 2015-06-28 LAB — DIFFERENTIAL
BASOS PCT: 0 %
Basophils Absolute: 0.1 10*3/uL (ref 0.0–0.1)
EOS ABS: 0.4 10*3/uL (ref 0.0–0.7)
EOS PCT: 3 %
LYMPHS ABS: 6.8 10*3/uL — AB (ref 0.7–4.0)
Lymphocytes Relative: 46 %
MONO ABS: 1.2 10*3/uL — AB (ref 0.1–1.0)
MONOS PCT: 8 %
Neutro Abs: 6.3 10*3/uL (ref 1.7–7.7)
Neutrophils Relative %: 43 %

## 2015-06-28 LAB — RAPID URINE DRUG SCREEN, HOSP PERFORMED
AMPHETAMINES: NOT DETECTED
Barbiturates: NOT DETECTED
Benzodiazepines: POSITIVE — AB
Cocaine: NOT DETECTED
OPIATES: NOT DETECTED
TETRAHYDROCANNABINOL: NOT DETECTED

## 2015-06-28 LAB — CBC
HEMATOCRIT: 48 % — AB (ref 36.0–46.0)
Hemoglobin: 16 g/dL — ABNORMAL HIGH (ref 12.0–15.0)
MCH: 30.7 pg (ref 26.0–34.0)
MCHC: 33.3 g/dL (ref 30.0–36.0)
MCV: 92 fL (ref 78.0–100.0)
PLATELETS: 291 10*3/uL (ref 150–400)
RBC: 5.22 MIL/uL — ABNORMAL HIGH (ref 3.87–5.11)
RDW: 13.8 % (ref 11.5–15.5)
WBC: 14.8 10*3/uL — AB (ref 4.0–10.5)

## 2015-06-28 LAB — URINALYSIS, ROUTINE W REFLEX MICROSCOPIC
Bilirubin Urine: NEGATIVE
Glucose, UA: NEGATIVE mg/dL
Hgb urine dipstick: NEGATIVE
Ketones, ur: NEGATIVE mg/dL
Leukocytes, UA: NEGATIVE
Nitrite: NEGATIVE
Protein, ur: NEGATIVE mg/dL
Specific Gravity, Urine: <1.005 — ABNORMAL LOW (ref 1.005–1.030)
Urobilinogen, UA: 0.2 mg/dL (ref 0.0–1.0)
pH: 6 (ref 5.0–8.0)

## 2015-06-28 LAB — PROTIME-INR
INR: 1.01 (ref 0.00–1.49)
PROTHROMBIN TIME: 13.5 s (ref 11.6–15.2)

## 2015-06-28 LAB — I-STAT CHEM 8, ED
BUN: 14 mg/dL (ref 6–20)
Calcium, Ion: 1.19 mmol/L (ref 1.13–1.30)
Chloride: 99 mmol/L — ABNORMAL LOW (ref 101–111)
Creatinine, Ser: 0.6 mg/dL (ref 0.44–1.00)
Glucose, Bld: 98 mg/dL (ref 65–99)
HCT: 51 % — ABNORMAL HIGH (ref 36.0–46.0)
Hemoglobin: 17.3 g/dL — ABNORMAL HIGH (ref 12.0–15.0)
Potassium: 3.6 mmol/L (ref 3.5–5.1)
Sodium: 139 mmol/L (ref 135–145)
TCO2: 29 mmol/L (ref 0–100)

## 2015-06-28 LAB — I-STAT TROPONIN, ED: Troponin i, poc: 0 ng/mL (ref 0.00–0.08)

## 2015-06-28 LAB — ETHANOL: Alcohol, Ethyl (B): <5 mg/dL

## 2015-06-28 LAB — APTT: aPTT: 28 s (ref 24–37)

## 2015-06-28 MED ORDER — DIPHENHYDRAMINE HCL 50 MG/ML IJ SOLN
25.0000 mg | Freq: Once | INTRAMUSCULAR | Status: AC
  Administered 2015-06-28: 25 mg via INTRAVENOUS
  Filled 2015-06-28: qty 1

## 2015-06-28 MED ORDER — DEXAMETHASONE SODIUM PHOSPHATE 4 MG/ML IJ SOLN
10.0000 mg | Freq: Once | INTRAMUSCULAR | Status: AC
  Administered 2015-06-28: 10 mg via INTRAVENOUS
  Filled 2015-06-28: qty 3

## 2015-06-28 MED ORDER — PROMETHAZINE HCL 25 MG/ML IJ SOLN
12.5000 mg | Freq: Once | INTRAMUSCULAR | Status: AC
  Administered 2015-06-28: 12.5 mg via INTRAVENOUS
  Filled 2015-06-28: qty 1

## 2015-06-28 MED ORDER — FENTANYL CITRATE (PF) 100 MCG/2ML IJ SOLN
50.0000 ug | Freq: Once | INTRAMUSCULAR | Status: AC
  Administered 2015-06-28: 50 ug via INTRAVENOUS
  Filled 2015-06-28: qty 2

## 2015-06-28 NOTE — ED Notes (Signed)
Pt unsure of what happened, states that she fell, has had this happen before due to hx of previous back problems, pt remains in ct at present time,

## 2015-06-28 NOTE — ED Notes (Signed)
  CBG 103  

## 2015-06-28 NOTE — ED Notes (Signed)
Pt arrived to er by Aaron Edelman, EMS with c/o fall, sudden onset of lower extremity weakness, pt speech slow to answer questions, will grip hands bilaterally when asked but grips are weak, no movement noted from lower extremities when asked, Dr Deretha Emory at bedside with EMS, called code stroke, pt taken straight to ct room,  ,

## 2015-06-28 NOTE — ED Provider Notes (Signed)
CSN: [id number]     Arrival date & time 06/28/15  1849 History   First MD Initiated Contact with Patient 06/28/15 1901     Chief Complaint  Patient presents with  . Code Stroke     (Consider location/radiation/quality/duration/timing/severity/associated sxs/prior Treatment) The history is provided by the patient and the EMS personnel.   a 64 year old female brought in by EMS as a code stroke. Patient was standing at the stove cooking dinner and all of a sudden of went down. Not able to move legs. EMS noted that upper extremities were weak and lower extremities were more week. In addition patient complaining of headache patient also seemed to be a bit confused. Patient arrived as a code stroke include stroke protocols were initiated.  Patient is seen here frequently in the emergency department for chronic back pain problems. Also seen sometimes for behavioral problems. Patient arrived drowsy some speech difficulty but seem to be able to communicate with her. States she was feeling fine and then all of a sudden just went down.  Past Medical History  Diagnosis Date  . Arthritis   . Bronchitis   . Hypertension   . Chronic back pain   . Chronic knee pain   . COPD (chronic obstructive pulmonary disease)   . Memory difficulty 2015  . Weakness of both legs   . 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 Knee surgery  2007  . Appendectomy  1971  . Tonsillectomy  1971   Family History  Problem Relation Age of Onset  . Heart failure Father   . Diabetes Father   . Kidney failure Father    Social History  Substance Use Topics  . Smoking status: Light Tobacco Smoker -- 0.50 packs/day for 10 years    Types: Cigarettes  . Smokeless tobacco: None     Comment: 04/30/15 less than 1 PPD  . Alcohol Use: 0.0 oz/week    0 Standard drinks or equivalent per week     Comment: Very rare    OB History    Gravida Para Term Preterm AB TAB SAB Ectopic Multiple Living   2 2 2             Review of Systems  Constitutional: Negative for fever.  HENT: Negative for congestion.   Eyes: Negative for visual disturbance.  Respiratory: Negative for shortness of breath.   Cardiovascular: Negative for chest pain.  Gastrointestinal: Negative for nausea, vomiting and abdominal pain.  Genitourinary: Negative for difficulty urinating.  Musculoskeletal: Positive for back pain. Negative for neck pain.  Skin: Negative for rash and wound.  Neurological: Positive for speech difficulty, weakness, numbness and headaches.  Hematological: Does not bruise/bleed easily.  Psychiatric/Behavioral: Positive for confusion.      Allergies  Bee venom; Mushroom extract complex; Amitriptyline; Toradol; Tramadol; and Trazodone and nefazodone  Home Medications   Prior to Admission medications   Medication Sig Start Date End Date Taking? Authorizing Provider  amLODipine (NORVASC) 2.5 MG tablet Take 2.5 mg by mouth daily.   Yes Historical Provider, MD  cyclobenzaprine (FLEXERIL) 10 MG tablet Take 10 mg by mouth 3 (three) times daily as needed for muscle spasms.   Yes Historical Provider, MD  diazepam (VALIUM) 5 MG tablet Take 5 mg by mouth 3 (three) times daily as needed for anxiety.   Yes Historical Provider, MD  DULoxetine (CYMBALTA) 60 MG capsule Take 60 mg  by mouth at bedtime.    Yes Historical Provider, MD  gabapentin (NEURONTIN) 300 MG capsule Take 300 mg by mouth 3 (three) times daily.   Yes Historical Provider, MD  HYDROcodone-acetaminophen (NORCO) 10-325 MG per tablet Take 1 tablet by mouth every 6 (six) hours as needed for moderate pain or severe pain.   Yes Historical Provider, MD  hydrOXYzine (VISTARIL) 25 MG capsule Take 25 mg by mouth at bedtime.   Yes Historical Provider, MD  metFORMIN (GLUCOPHAGE-XR) 500 MG 24 hr tablet Take 500 mg by mouth at bedtime.  12/15/14  Yes Historical Provider, MD   metoprolol succinate (TOPROL-XL) 50 MG 24 hr tablet Take 50 mg by mouth at bedtime. Take with or immediately following a meal.   Yes Historical Provider, MD  VOLTAREN 1 % GEL Apply 4 g topically every 8 (eight) hours.  02/06/15  Yes Historical Provider, MD  albuterol (PROVENTIL HFA;VENTOLIN HFA) 108 (90 BASE) MCG/ACT inhaler Inhale 2 puffs into the lungs every 6 (six) hours as needed for wheezing. 06/21/13   Nimish Normajean Glasgow, MD  diphenhydrAMINE (BENADRYL) 25 MG tablet Take 2 tablets (50 mg total) by mouth every 4 (four) hours as needed for itching. 12/17/13   Wayland Salinas, MD  EVZIO 0.4 MG/0.4ML SOAJ Inject 0.4 mLs into the muscle once as needed (for Anaphylaxis/allergic reaction).  02/19/15   Historical Provider, MD  naproxen (NAPROSYN) 500 MG tablet Take 1 tablet (500 mg total) by mouth 2 (two) times daily with a meal. Patient not taking: Reported on 05/16/2015 05/03/15   Eber Hong, MD   BP 178/99 mmHg  Pulse 61  Temp(Src) 98 F (36.7 C)  Resp 14  SpO2 95% Physical Exam  Constitutional: She appears well-developed and well-nourished. No distress.  HENT:  Head: Normocephalic and atraumatic.  Mouth/Throat: Oropharynx is clear and moist.  Eyes: Conjunctivae and EOM are normal. Pupils are equal, round, and reactive to light.  Neck: Normal range of motion. Neck supple.  Cardiovascular: Normal rate, regular rhythm and normal heart sounds.   No murmur heard. Pulmonary/Chest: Effort normal and breath sounds normal. No respiratory distress.  Abdominal: Soft. Bowel sounds are normal. She exhibits no distension. There is no tenderness.  Musculoskeletal: She exhibits no edema.  Upper extremity with normal movement. It was bilaterally weak when she first came in but had movement now back to normal. Patient's lower extremities with minimal movement since arrival. Also no sensation. This is bilateral. Refill is 2 seconds to both big toes.  Neurological: She is alert. No cranial nerve deficit. She exhibits  abnormal muscle tone. Coordination abnormal.  Refer to musculoskeletal exam   In addition patient seemed to have some speech problems upon arrival and seemed to be drowsy.   Skin: Skin is warm. No rash noted.  Nursing note and vitals reviewed.   ED Course  Procedures (including critical care time) Labs Review Labs Reviewed  CBC - Abnormal; Notable for the following:    WBC 14.8 (*)    RBC 5.22 (*)    Hemoglobin 16.0 (*)    HCT 48.0 (*)    All other components within normal limits  DIFFERENTIAL - Abnormal; Notable for the following:    Lymphs Abs 6.8 (*)    Monocytes Absolute 1.2 (*)    All other components within normal limits  COMPREHENSIVE METABOLIC PANEL - Abnormal; Notable for the following:    Sodium 134 (*)    Chloride 100 (*)    Total Protein 6.2 (*)  Albumin 3.4 (*)    Total Bilirubin 0.2 (*)    All other components within normal limits  URINE RAPID DRUG SCREEN, HOSP PERFORMED - Abnormal; Notable for the following:    Benzodiazepines POSITIVE (*)    All other components within normal limits  URINALYSIS, ROUTINE W REFLEX MICROSCOPIC (NOT AT [facility]) - Abnormal; Notable for the following:    Specific Gravity, Urine <1.005 (*)    All other components within normal limits  I-STAT CHEM 8, ED - Abnormal; Notable for the following:    Chloride 99 (*)    Hemoglobin 17.3 (*)    HCT 51.0 (*)    All other components within normal limits  ETHANOL  PROTIME-INR  APTT  I-STAT TROPOININ, ED   Results for orders placed or performed during the hospital encounter of 06/28/15  Ethanol  Result Value Ref Range   Alcohol, Ethyl (B) <5 <5 mg/dL  Protime-INR  Result Value Ref Range   Prothrombin Time 13.5 11.6 - 15.2 seconds   INR 1.01 0.00 - 1.49  APTT  Result Value Ref Range   aPTT 28 24 - 37 seconds  CBC  Result Value Ref Range   WBC 14.8 (H) 4.0 - 10.5 K/uL   RBC 5.22 (H) 3.87 - 5.11 MIL/uL   Hemoglobin 16.0 (H) 12.0 - 15.0 g/dL   HCT 16.1 (H) 09.6 - 04.5 %   MCV 92.0  78.0 - 100.0 fL   MCH 30.7 26.0 - 34.0 pg   MCHC 33.3 30.0 - 36.0 g/dL   RDW 40.9 81.1 - 91.4 %   Platelets 291 150 - 400 K/uL  Differential  Result Value Ref Range   Neutrophils Relative % 43 %   Neutro Abs 6.3 1.7 - 7.7 K/uL   Lymphocytes Relative 46 %   Lymphs Abs 6.8 (H) 0.7 - 4.0 K/uL   Monocytes Relative 8 %   Monocytes Absolute 1.2 (H) 0.1 - 1.0 K/uL   Eosinophils Relative 3 %   Eosinophils Absolute 0.4 0.0 - 0.7 K/uL   Basophils Relative 0 %   Basophils Absolute 0.1 0.0 - 0.1 K/uL   WBC Morphology ABSOLUTE LYMPHOCYTOSIS   Comprehensive metabolic panel  Result Value Ref Range   Sodium 134 (L) 135 - 145 mmol/L   Potassium 3.7 3.5 - 5.1 mmol/L   Chloride 100 (L) 101 - 111 mmol/L   CO2 26 22 - 32 mmol/L   Glucose, Bld 95 65 - 99 mg/dL   BUN 14 6 - 20 mg/dL   Creatinine, Ser 7.82 0.44 - 1.00 mg/dL   Calcium 9.0 8.9 - 95.6 mg/dL   Total Protein 6.2 (L) 6.5 - 8.1 g/dL   Albumin 3.4 (L) 3.5 - 5.0 g/dL   AST 26 15 - 41 U/L   ALT 25 14 - 54 U/L   Alkaline Phosphatase 105 38 - 126 U/L   Total Bilirubin 0.2 (L) 0.3 - 1.2 mg/dL   GFR calc non Af Amer >60 >60 mL/min   GFR calc Af Amer >60 >60 mL/min   Anion gap 8 5 - 15  Urine rapid drug screen (hosp performed)not at [facility]  Result Value Ref Range   Opiates NONE DETECTED NONE DETECTED   Cocaine NONE DETECTED NONE DETECTED   Benzodiazepines POSITIVE (A) NONE DETECTED   Amphetamines NONE DETECTED NONE DETECTED   Tetrahydrocannabinol NONE DETECTED NONE DETECTED   Barbiturates NONE DETECTED NONE DETECTED  Urinalysis, Routine w reflex microscopic (not at [facility])  Result Value Ref Range  Color, Urine YELLOW YELLOW   APPearance CLEAR CLEAR   Specific Gravity, Urine <1.005 (L) 1.005 - 1.030   pH 6.0 5.0 - 8.0   Glucose, UA NEGATIVE NEGATIVE mg/dL   Hgb urine dipstick NEGATIVE NEGATIVE   Bilirubin Urine NEGATIVE NEGATIVE   Ketones, ur NEGATIVE NEGATIVE mg/dL   Protein, ur NEGATIVE NEGATIVE mg/dL   Urobilinogen, UA 0.2 0.0 - 1.0  mg/dL   Nitrite NEGATIVE NEGATIVE   Leukocytes, UA NEGATIVE NEGATIVE  I-Stat Chem 8, ED  (not at [facility])  Result Value Ref Range   Sodium 139 135 - 145 mmol/L   Potassium 3.6 3.5 - 5.1 mmol/L   Chloride 99 (L) 101 - 111 mmol/L   BUN 14 6 - 20 mg/dL   Creatinine, Ser 7.82 0.44 - 1.00 mg/dL   Glucose, Bld 98 65 - 99 mg/dL   Calcium, Ion 9.56 2.13 - 1.30 mmol/L   TCO2 29 0 - 100 mmol/L   Hemoglobin 17.3 (H) 12.0 - 15.0 g/dL   HCT 08.6 (H) 57.8 - 46.9 %  I-stat troponin, ED (not at [facility])  Result Value Ref Range   Troponin i, poc 0.00 0.00 - 0.08 ng/mL   Comment 3             Imaging Review Ct Head Wo Contrast  06/28/2015   CLINICAL DATA:  Acute onset syncope and headache  EXAM: CT HEAD WITHOUT CONTRAST  TECHNIQUE: Contiguous axial images were obtained from the base of the skull through the vertex without intravenous contrast.  COMPARISON:  June 05, 2015  FINDINGS: The ventricles are normal in size and configuration. There is no intracranial mass, hemorrhage, extra-axial fluid collection, or midline shift. Gray-white compartments are normal except for a slight degree of small vessel disease in the left corona radiata, stable. No acute infarct evident. Middle cerebral arteries appear symmetric without increased attenuation on either side. Bony calvarium appears intact. The mastoid air cells are clear.  IMPRESSION: No demonstrable acute appearing infarct. Gray-white compartments are stable with minimal small vessel disease. No hemorrhage or mass effect.  Critical Value/emergent results were called by telephone at the time of interpretation on 06/28/2015 at 7:18 pm to Dr. Vanetta Mulders , who verbally acknowledged these results.   Electronically Signed   By: Bretta Bang III M.D.   On: 06/28/2015 19:18   Ct Cervical Spine Wo Contrast  06/28/2015   CLINICAL DATA:  Patient status post fall.  EXAM: CT CERVICAL SPINE WITHOUT CONTRAST  TECHNIQUE: Multidetector CT imaging of the cervical  spine was performed without intravenous contrast. Multiplanar CT image reconstructions were also generated.  COMPARISON:  Brain CT earlier same day  FINDINGS: Normal anatomic alignment. No evidence for acute fracture dislocation. Craniocervical junction is intact. Degenerative disc disease most pronounced C5-6 and C7-T1. Prevertebral soft tissues unremarkable. Nonspecific mosaic attenuation of the lung apices bilaterally.  IMPRESSION: No evidence for acute cervical spine fracture. Multilevel degenerative changes.   Electronically Signed   By: Annia Belt M.D.   On: 06/28/2015 19:37   Ct Lumbar Spine Wo Contrast  06/28/2015   CLINICAL DATA:  Patient states no feeling below waist.  No injury.  EXAM: CT LUMBAR SPINE WITHOUT CONTRAST  TECHNIQUE: Multidetector CT imaging of the lumbar spine was performed without intravenous contrast administration. Multiplanar CT image reconstructions were also generated.  COMPARISON:  MRI lumbar spine 03/13/2015 and CT 06/04/2015  FINDINGS: Vertebral body alignment and heights are normal. There is mild spondylosis throughout the lumbar spine. There  is mild disc space narrowing at the L2-3 and L4-5 levels. There is no compression fracture or subluxation.  Axial images at the L1-2 level demonstrate no disc herniation, canal stenosis or significant neural foraminal narrowing. Facet arthropathy is present.  The L2-3 level demonstrates a moderate broad-based disc bulge without focal disc herniation. There is moderate canal stenosis due to the disc disease and facet arthropathy unchanged. Moderate bilateral neural foraminal narrowing unchanged. Moderate facet arthropathy is present.  L3-4 level demonstrates a broad-based disc bulge without focal disc herniation. There is mild canal stenosis due to the disc disease and facet arthropathy. No significant neural foraminal narrowing.  The L4-5 level demonstrates a moderate broad-based disc bulge without focal disc herniation. There is moderate  canal stenosis due to disc disease and facet arthropathy unchanged. Bilateral neural foraminal narrowing unchanged.  The L5-S1 level demonstrates no disc herniation, canal stenosis or significant neural foraminal narrowing. Mild facet arthropathy.  Degenerative change of the right sacroiliac joint. There is calcified plaque over the abdominal aorta.  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 No definite focal disc herniation. There is moderate canal stenosis at the L2-3 and L4-5 levels as well as mild canal stenosis at the L3-4 level unchanged. Bilateral neural foraminal narrowing multiple levels unchanged.   Electronically Signed   By: Elberta Fortis M.D.   On: 06/28/2015 22:04   I have personally reviewed and evaluated these images and lab results as part of my medical decision-making.   EKG Interpretation   Date/Time:  Thursday June 28 2015 19:23:13 EDT Ventricular Rate:  59 PR Interval:  145 QRS Duration: 77 QT Interval:  432 QTC Calculation: 428 R Axis:   57 Text Interpretation:  Sinus rhythm Confirmed by Vika Buske  MD, Talina Pleitez  [phone]) on 06/28/2015 7:32:22 PM      CRITICAL CARE Performed by: Vanetta Mulders Total critical care time: 30 Critical care time was exclusive of separately billable procedures and treating other patients. Critical care was necessary to treat or prevent imminent or life-threatening deterioration. Critical care was time spent personally by me on the following activities: development of treatment plan with patient and/or surrogate as well as nursing, discussions with consultants, evaluation of patient's response to treatment, examination of patient, obtaining history from patient or surrogate, ordering and performing treatments and interventions, ordering and review of laboratory studies, ordering and review of radiographic studies, pulse oximetry and re-evaluation of patient's condition.     MDM   Final  diagnoses:  Bilateral leg weakness    Patient arrived as a code stroke of EMS. Patient collapsed at home no loss of consciousness was having upper extremity and lower extremity weakness according to them. Upon arrival patient seemed to have a little bit altered metal status was also complaining of a headache and minimal movement of both legs had some movement of both arms.  Head CT was negative for any head bleed or evidence of an acute stroke. Patient was evaluated by the patella neurologist. Did not feel that the physical findings were consistent with a stroke and I agree. If felt that there was some sort of significant leg weakness partial paralysis in both legs, T12 and below.  Patient had CT of neck and CT of lumbar spine without any acute findings however patient does have significant history of back problems and partial herniated disc in his had injections Sr. Fay Records the areas followed by neurosurgery in [place].  Patients of leg movement improved a little bit  but never got back to normal. Based on that patient requires MRI of the lumbar spine to rule out of any blood on the spine doubt that there is an abscess or any herniated disks at central. Patient's had no incontinence. Both myself and the patella neurologist are aware that this possible this could be psychosomatic. But we still need the MRI to clear her.  [facility] will be transferred [facility] for an [facility] MRI not available here tonight. He has been ordered she will go to the emergency department at [facility]. Disposition will be based on the MRI findings.    Vanetta Mulders, MD 06/28/15 [phone]

## 2015-06-28 NOTE — ED Notes (Signed)
Pt in room at present, Dr Deretha Emory at bedside for further exam, Tele neuro machine at bedside for teleneuro exam,

## 2015-06-28 NOTE — ED Notes (Signed)
Unable to do initial nihha scale at arrival due to pt being sent to ct scan,

## 2015-06-28 NOTE — ED Notes (Signed)
Pt states that she has had a headache all day, took flexeril with no improvement in symptoms,

## 2015-06-28 NOTE — ED Notes (Signed)
Lab in ct attempting X2 for blood work without success,

## 2015-06-28 NOTE — ED Notes (Signed)
Code stroke was called at 1854. SOC was called and the page was sent out.

## 2015-06-29 ENCOUNTER — Observation Stay (HOSPITAL_COMMUNITY): Payer: Medicare Other

## 2015-06-29 ENCOUNTER — Emergency Department (HOSPITAL_COMMUNITY): Payer: Medicare Other

## 2015-06-29 DIAGNOSIS — I10 Essential (primary) hypertension: Secondary | ICD-10-CM | POA: Diagnosis present

## 2015-06-29 DIAGNOSIS — R55 Syncope and collapse: Secondary | ICD-10-CM | POA: Diagnosis not present

## 2015-06-29 DIAGNOSIS — M48062 Spinal stenosis, lumbar region with neurogenic claudication: Secondary | ICD-10-CM | POA: Diagnosis present

## 2015-06-29 HISTORY — DX: Spinal stenosis, lumbar region with neurogenic claudication: M48.062

## 2015-06-29 LAB — SAVE SMEAR

## 2015-06-29 LAB — TSH: TSH: 1.912 u[IU]/mL (ref 0.350–4.500)

## 2015-06-29 LAB — PHOSPHORUS: PHOSPHORUS: 2.8 mg/dL (ref 2.5–4.6)

## 2015-06-29 LAB — VITAMIN B12: Vitamin B-12: 315 pg/mL (ref 180–914)

## 2015-06-29 LAB — MAGNESIUM: MAGNESIUM: 1.8 mg/dL (ref 1.7–2.4)

## 2015-06-29 LAB — SEDIMENTATION RATE: SED RATE: 10 mm/h (ref 0–22)

## 2015-06-29 LAB — TECHNOLOGIST SMEAR REVIEW

## 2015-06-29 MED ORDER — ENOXAPARIN SODIUM 40 MG/0.4ML ~~LOC~~ SOLN
40.0000 mg | SUBCUTANEOUS | Status: DC
  Administered 2015-06-29 – 2015-06-30 (×2): 40 mg via SUBCUTANEOUS
  Filled 2015-06-29 (×2): qty 0.4

## 2015-06-29 MED ORDER — SODIUM CHLORIDE 0.9 % IJ SOLN
3.0000 mL | Freq: Two times a day (BID) | INTRAMUSCULAR | Status: DC
Start: 2015-06-29 — End: 2015-06-30
  Administered 2015-06-29 (×2): 3 mL via INTRAVENOUS

## 2015-06-29 MED ORDER — DIAZEPAM 5 MG/ML IJ SOLN
5.0000 mg | Freq: Once | INTRAMUSCULAR | Status: AC
  Administered 2015-06-29: 5 mg via INTRAVENOUS
  Filled 2015-06-29: qty 2

## 2015-06-29 MED ORDER — GABAPENTIN 300 MG PO CAPS
300.0000 mg | ORAL_CAPSULE | Freq: Three times a day (TID) | ORAL | Status: DC
  Administered 2015-06-29 – 2015-06-30 (×5): 300 mg via ORAL
  Filled 2015-06-29 (×5): qty 1

## 2015-06-29 MED ORDER — DULOXETINE HCL 60 MG PO CPEP
60.0000 mg | ORAL_CAPSULE | Freq: Every day | ORAL | Status: DC
  Administered 2015-06-29: 60 mg via ORAL
  Filled 2015-06-29: qty 1

## 2015-06-29 MED ORDER — AMLODIPINE BESYLATE 5 MG PO TABS: 5.0000 mg | ORAL_TABLET | Freq: Every day | ORAL | Status: DC

## 2015-06-29 MED ORDER — AMLODIPINE BESYLATE 10 MG PO TABS: 10.0000 mg | ORAL_TABLET | Freq: Every day | ORAL | Status: DC

## 2015-06-29 MED ORDER — FENTANYL CITRATE (PF) 100 MCG/2ML IJ SOLN
50.0000 ug | Freq: Once | INTRAMUSCULAR | Status: AC
  Administered 2015-06-29: 50 ug via INTRAVENOUS
  Filled 2015-06-29: qty 2

## 2015-06-29 MED ORDER — HYDROCODONE-ACETAMINOPHEN 10-325 MG PO TABS
1.0000 | ORAL_TABLET | Freq: Four times a day (QID) | ORAL | Status: DC | PRN
  Administered 2015-06-29 – 2015-06-30 (×4): 1 via ORAL
  Filled 2015-06-29 (×4): qty 1

## 2015-06-29 MED ORDER — AMLODIPINE BESYLATE 2.5 MG PO TABS
2.5000 mg | ORAL_TABLET | Freq: Every day | ORAL | Status: DC
  Administered 2015-06-29 – 2015-06-30 (×2): 2.5 mg via ORAL
  Filled 2015-06-29 (×2): qty 1

## 2015-06-29 MED ORDER — METOPROLOL SUCCINATE ER 25 MG PO TB24
50.0000 mg | ORAL_TABLET | Freq: Every day | ORAL | Status: DC
  Administered 2015-06-29 (×2): 50 mg via ORAL
  Filled 2015-06-29 (×2): qty 2

## 2015-06-29 MED ORDER — NICOTINE 21 MG/24HR TD PT24
21.0000 mg | MEDICATED_PATCH | Freq: Every day | TRANSDERMAL | Status: DC
  Filled 2015-06-29 (×2): qty 1

## 2015-06-29 MED ORDER — SODIUM CHLORIDE 0.9 % IV BOLUS (SEPSIS)
1000.0000 mL | Freq: Once | INTRAVENOUS | Status: AC
  Administered 2015-06-29: 1000 mL via INTRAVENOUS

## 2015-06-29 MED ORDER — ALBUTEROL SULFATE (2.5 MG/3ML) 0.083% IN NEBU: 3.0000 mL | INHALATION_SOLUTION | Freq: Four times a day (QID) | RESPIRATORY_TRACT | Status: DC | PRN

## 2015-06-29 MED ORDER — METOPROLOL SUCCINATE ER 25 MG PO TB24: 50.0000 mg | ORAL_TABLET | Freq: Every day | ORAL | Status: DC

## 2015-06-29 MED ORDER — DIAZEPAM 5 MG/ML IJ SOLN
5.0000 mg | Freq: Once | INTRAMUSCULAR | Status: AC
Start: 2015-06-29 — End: 2015-06-29
  Administered 2015-06-29: 5 mg via INTRAVENOUS
  Filled 2015-06-29: qty 2

## 2015-06-29 MED ORDER — PERFLUTREN LIPID MICROSPHERE
1.0000 mL | INTRAVENOUS | Status: AC | PRN
  Administered 2015-06-29: 2 mL via INTRAVENOUS
  Filled 2015-06-29: qty 10

## 2015-06-29 NOTE — ED Notes (Signed)
Meal tray ordered 

## 2015-06-29 NOTE — H&P (Signed)
Date: 06/29/2015               Patient Name:  Carla [name] MRN: [id number]  DOB: [birth year] Age / Sex: 64 y.o., female   PCP: Celedonio Savage, MD         Medical Service: Internal Medicine Teaching Service         Attending Physician: Dr. Sid Falcon, MD    First Contact: Dr. Marlowe Sax Pager: [phone]  Second Contact: Dr. Naaman Plummer Pager: [phone]       After Hours (After 5p/  First Contact Pager: [phone]  weekends / holidays): Second Contact Pager: [phone]   Chief Complaint: syncope  History of Present Illness: Patient is a 64 yo F with a PMHx of ostearthritis, bronchitis, HTN, chronic back and knee pain, COPD, weakness of both legs, and pseudodementia presenting to [facility] with a chief complaint of syncope. Patient's daughter was in the room who stated the patient was talking to her in the kitchen yesterday and all of a sudden became "stiff" and fell on the ground. States patient lost consciousness for a brief period until EMS arrived and then woke up confused. States patient did not injure her head. Daughter denies noticing any limb jerking movements, defecation, or urination. However, patient states she was having palpitations and chest pain and then fell on the ground. Pt states she gets chest pain at both rest and activity which happens on a regular basis, especially in the evening. States the pain lasts for 1 minute. Denies any nausea, diaphoresis, or SOB associated with the chest pain. States she has chronic SOB for which she uses Qvar. Patient states she has a history of frequent falls because "her legs give up." As per patient, she has a history of bilateral knee osteoarthritis and has "scar tissue" in her back which has caused her legs to become weak. Denies any focal weakness, numbness, or slurring of speech. Patient also states she thinks she has "crystals" in he ears which cause her to loose balance and fall.    Meds: Current Facility-Administered Medications  Medication Dose Route  Frequency Provider Last Rate Last Dose  . albuterol (PROVENTIL) (2.5 MG/3ML) 0.083% nebulizer solution 3 mL  3 mL Inhalation Q6H PRN Marjan Rabbani, MD      . amLODipine (NORVASC) tablet 2.5 mg  2.5 mg Oral Daily Marjan Rabbani, MD   2.5 mg at 06/29/15 1123  . diazepam (VALIUM) injection 5 mg  5 mg Intravenous Once Liberty Handy, MD      . DULoxetine (CYMBALTA) DR capsule 60 mg  60 mg Oral QHS Marjan Rabbani, MD      . enoxaparin (LOVENOX) injection 40 mg  40 mg Subcutaneous Q24H Marjan Rabbani, MD   40 mg at 06/29/15 1123  . gabapentin (NEURONTIN) capsule 300 mg  300 mg Oral TID Juluis Mire, MD   300 mg at 06/29/15 1620  . HYDROcodone-acetaminophen (NORCO) 10-325 MG per tablet 1 tablet  1 tablet Oral Q6H PRN Juluis Mire, MD   1 tablet at 06/29/15 1742  . metoprolol succinate (TOPROL-XL) 24 hr tablet 50 mg  50 mg Oral QHS Marjan Rabbani, MD   50 mg at 06/29/15 1123  . nicotine (NICODERM CQ - dosed in mg/24 hours) patch 21 mg  21 mg Transdermal Daily Marjan Rabbani, MD   21 mg at 06/29/15 1122  . sodium chloride 0.9 % injection 3 mL  3 mL Intravenous Q12H Marjan Rabbani, MD   3 mL at 06/29/15 1123  Allergies: Allergies as of 06/28/2015 - Review Complete 06/28/2015  Allergen Reaction Noted  . Bee venom Anaphylaxis 01/30/2012  . Mushroom extract complex Anaphylaxis 07/24/2013  . Amitriptyline Other (See Comments) 08/02/2011  . Toradol [ketorolac tromethamine] Nausea And Vomiting 06/20/2013  . Tramadol Nausea And Vomiting 05/16/2011  . Trazodone and nefazodone Nausea And Vomiting 04/07/2015   Past Medical History  Diagnosis Date  . Arthritis   . Bronchitis   . Hypertension   . Chronic back pain   . Chronic knee pain   . COPD (chronic obstructive pulmonary disease)   . Memory difficulty 2015  . Weakness of both legs   . 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 Knee  surgery  2007  . Appendectomy  1971  . Tonsillectomy  1971   Family History  Problem Relation Age of Onset  . Heart failure Father   . Diabetes Father   . Kidney failure Father    Social History   Social History  . Marital Status: Widowed    Spouse Name: N/A  . Number of Children: 2  . Years of Education: 12   Occupational History  . Retired    Social History Main Topics  . Smoking status: Light Tobacco Smoker -- 0.50 packs/day for 10 years    Types: Cigarettes  . Smokeless tobacco: Not on file     Comment: 04/30/15 less than 1 PPD  . Alcohol Use: 0.0 oz/week    0 Standard drinks or equivalent per week     Comment: Very rare  . Drug Use: No  . Sexual Activity: No   Other Topics Concern  . Not on file   Social History Narrative   Lives at home with your 2 daughters, son-in-law, 4 grandkids and more   Right and left handed   Caffeine use: very rare .Has coke 2 times a week.       Review of Systems: Review of Systems  Constitutional: Negative for fever and chills.  HENT: Negative for ear pain.   Eyes: Negative for blurred vision and pain.  Respiratory: Positive for shortness of breath. Negative for wheezing.   Cardiovascular: Positive for chest pain and palpitations.  Gastrointestinal: Negative for nausea, vomiting, abdominal pain, diarrhea and constipation.  Genitourinary: Negative for dysuria, urgency and frequency.  Musculoskeletal: Positive for back pain and joint pain.  Skin: Negative for itching and rash.  Neurological: Positive for loss of consciousness. Negative for sensory change, focal weakness and headaches.    Physical Exam: Blood pressure 172/65, pulse 71, temperature 98.1 F (36.7 C), temperature source Oral, resp. rate 22, height 5' 4.17" (1.63 m), weight 190 lb 7.6 oz (86.4 kg), SpO2 93 %. Physical Exam  Constitutional: She is oriented to person, place, and time. She appears well-developed and well-nourished. No distress.  HENT:  Head:  Normocephalic and atraumatic.  Eyes: EOM are normal. Pupils are equal, round, and reactive to light.  Neck: Neck supple. No tracheal deviation present.  Cardiovascular: Normal rate, regular rhythm and intact distal pulses.  Exam reveals no gallop and no friction rub.   No murmur heard. Pulmonary/Chest: Effort normal. No respiratory distress. She has no wheezes. She has no rales.  Abdominal: Soft. Bowel sounds are normal. She exhibits no distension. There is no tenderness.  Musculoskeletal: She exhibits no edema.  Neurological: She is alert and oriented to person, place, and time. No cranial nerve  deficit.  Sensation grossly intact in b/l upper and lower extremities. Strength 4/5 in bilateral upper and lower extremities.   Skin: Skin is warm and dry. She is not diaphoretic.    Lab results: Basic Metabolic Panel:  Recent Labs  06/28/15 1908 06/28/15 2016 06/29/15 1441  NA 134* 139  --   K 3.7 3.6  --   CL 100* 99*  --   CO2 26  --   --   GLUCOSE 95 98  --   BUN 14 14  --   CREATININE 0.63 0.60  --   CALCIUM 9.0  --   --   MG  --   --  1.8  PHOS  --   --  2.8   Liver Function Tests:  Recent Labs  06/28/15 1908  AST 26  ALT 25  ALKPHOS 105  BILITOT 0.2*  PROT 6.2*  ALBUMIN 3.4*   CBC:  Recent Labs  06/28/15 2012 06/28/15 2016  WBC 14.8*  --   NEUTROABS 6.3  --   HGB 16.0* 17.3*  HCT 48.0* 51.0*  MCV 92.0  --   PLT 291  --    Thyroid Function Tests:  Recent Labs  06/29/15 1441  TSH 1.912   Anemia Panel:  Recent Labs  06/29/15 1441  VITAMINB12 315   Coagulation:  Recent Labs  06/28/15 1908  LABPROT 13.5  INR 1.01   Urine Drug Screen: Drugs of Abuse     Component Value Date/Time   LABOPIA NONE DETECTED 06/28/2015 1930   COCAINSCRNUR NONE DETECTED 06/28/2015 1930   LABBENZ POSITIVE* 06/28/2015 1930   AMPHETMU NONE DETECTED 06/28/2015 1930   THCU NONE DETECTED 06/28/2015 1930   LABBARB NONE DETECTED 06/28/2015 1930    Alcohol  Level:  Recent Labs  06/28/15 2012  ETH <5   Urinalysis:  Recent Labs  06/28/15 1940  COLORURINE YELLOW  LABSPEC <1.005*  PHURINE 6.0  GLUCOSEU NEGATIVE  HGBUR NEGATIVE  BILIRUBINUR NEGATIVE  KETONESUR NEGATIVE  PROTEINUR NEGATIVE  UROBILINOGEN 0.2  NITRITE NEGATIVE  LEUKOCYTESUR NEGATIVE    Imaging results:  Dg Chest 2 View  06/29/2015   CLINICAL DATA:  Syncope yesterday.  EXAM: CHEST  2 VIEW  COMPARISON:  04/27/2015  FINDINGS: The heart size and mediastinal contours are within normal limits. Both lungs are clear. The visualized skeletal structures are unremarkable.  IMPRESSION: No active cardiopulmonary disease.   Electronically Signed   By: Rolm Baptise M.D.   On: 06/29/2015 14:19   Ct Head Wo Contrast  06/28/2015   CLINICAL DATA:  Acute onset syncope and headache  EXAM: CT HEAD WITHOUT CONTRAST  TECHNIQUE: Contiguous axial images were obtained from the base of the skull through the vertex without intravenous contrast.  COMPARISON:  June 05, 2015  FINDINGS: The ventricles are normal in size and configuration. There is no intracranial mass, hemorrhage, extra-axial fluid collection, or midline shift. Gray-white compartments are normal except for a slight degree of small vessel disease in the left corona radiata, stable. No acute infarct evident. Middle cerebral arteries appear symmetric without increased attenuation on either side. Bony calvarium appears intact. The mastoid air cells are clear.  IMPRESSION: No demonstrable acute appearing infarct. Gray-white compartments are stable with minimal small vessel disease. No hemorrhage or mass effect.  Critical Value/emergent results were called by telephone at the time of interpretation on 06/28/2015 at 7:18 pm to Dr. Fredia Sorrow , who verbally acknowledged these results.   Electronically Signed   By: Gwyndolyn Saxon  Jasmine December III M.D.   On: 06/28/2015 19:18   Ct Cervical Spine Wo Contrast  06/28/2015   CLINICAL DATA:  Patient status post  fall.  EXAM: CT CERVICAL SPINE WITHOUT CONTRAST  TECHNIQUE: Multidetector CT imaging of the cervical spine was performed without intravenous contrast. Multiplanar CT image reconstructions were also generated.  COMPARISON:  Brain CT earlier same day  FINDINGS: Normal anatomic alignment. No evidence for acute fracture dislocation. Craniocervical junction is intact. Degenerative disc disease most pronounced C5-6 and C7-T1. Prevertebral soft tissues unremarkable. Nonspecific mosaic attenuation of the lung apices bilaterally.  IMPRESSION: No evidence for acute cervical spine fracture. Multilevel degenerative changes.   Electronically Signed   By: Lovey Newcomer M.D.   On: 06/28/2015 19:37   Ct Lumbar Spine Wo Contrast  06/28/2015   CLINICAL DATA:  Patient states no feeling below waist.  No injury.  EXAM: CT LUMBAR SPINE WITHOUT CONTRAST  TECHNIQUE: Multidetector CT imaging of the lumbar spine was performed without intravenous contrast administration. Multiplanar CT image reconstructions were also generated.  COMPARISON:  MRI lumbar spine 03/13/2015 and CT 06/04/2015  FINDINGS: Vertebral body alignment and heights are normal. There is mild spondylosis throughout the lumbar spine. There is mild disc space narrowing at the L2-3 and L4-5 levels. There is no compression fracture or subluxation.  Axial images at the L1-2 level demonstrate no disc herniation, canal stenosis or significant neural foraminal narrowing. Facet arthropathy is present.  The L2-3 level demonstrates a moderate broad-based disc bulge without focal disc herniation. There is moderate canal stenosis due to the disc disease and facet arthropathy unchanged. Moderate bilateral neural foraminal narrowing unchanged. Moderate facet arthropathy is present.  L3-4 level demonstrates a broad-based disc bulge without focal disc herniation. There is mild canal stenosis due to the disc disease and facet arthropathy. No significant neural foraminal narrowing.  The L4-5  level demonstrates a moderate broad-based disc bulge without focal disc herniation. There is moderate canal stenosis due to disc disease and facet arthropathy unchanged. Bilateral neural foraminal narrowing unchanged.  The L5-S1 level demonstrates no disc herniation, canal stenosis or significant neural foraminal narrowing. Mild facet arthropathy.  Degenerative change of the right sacroiliac joint. There is calcified plaque over the abdominal aorta.  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 No definite focal disc herniation. There is moderate canal stenosis at the L2-3 and L4-5 levels as well as mild canal stenosis at the L3-4 level unchanged. Bilateral neural foraminal narrowing multiple levels unchanged.   Electronically Signed   By: Marin Olp M.D.   On: 06/28/2015 22:04   Mr Lumbar Spine Wo Contrast  06/29/2015   CLINICAL DATA:  Syncopal episode while cooking dinner, unable to move legs. Headache and confusion. Chronic back pain.  EXAM: MRI LUMBAR SPINE WITHOUT CONTRAST  TECHNIQUE: Multiplanar, multisequence MR imaging of the lumbar spine was performed. No intravenous contrast was administered.  COMPARISON:  CT lumbar spine June 28, 2015  FINDINGS: Lumbar vertebral bodies and posterior elements intact. Using the reference level of the last well-formed intervertebral disc as L5-S1, minimal L4-5 anterolisthesis without spondylolysis. Mild L2-3 and L5-S1 disc height loss. Decreased T2 signal within all lumbar discs consistent with mild desiccation. Mild chronic discogenic endplate changes at all lumbar levels. No STIR signal abnormality to suggest acute osseous process.  Conus medullaris terminates at L1 and appears normal morphology and signal characteristics. Cauda equina is centrally displaced due to canal stenosis and otherwise normal. Included prevertebral and paraspinal soft  tissues are normal.  Level by level evaluation:  T12-L1, L1-2: No  significant disc bulge. Mild facet arthropathy without canal stenosis or neural foraminal narrowing.  L2-3: Moderate broad-based disc bulge. Moderate facet arthropathy and ligamentum flavum redundancy. Moderate canal stenosis. Mild RIGHT neural foraminal narrowing.  L3-4: Annular bulging, moderate facet arthropathy and ligamentum flavum redundancy. Mild canal stenosis without neural foraminal narrowing.  L4-5: Anterolisthesis. Small broad-based disc bulge. Severe facet arthropathy and ligamentum flavum redundancy with trace facet effusions which are likely reactive. Moderate to severe canal stenosis, partially effaced LEFT lateral recess likely affects the traversing LEFT L5 nerve. Mild to moderate LEFT neural foraminal narrowing.  L5-S1: No disc bulge. Moderate to severe RIGHT, mild LEFT facet arthropathy. 4 mm RIGHT facet synovial cyst extends medially into the dorsal epidural space. No canal stenosis or neural foraminal narrowing.  IMPRESSION: No acute lumbar spine fracture. Minimal grade 1 L4-5 anterolisthesis on degenerative basis.  Moderate to severe canal stenosis at L4-5, moderate canal stenosis at L2-3. Partial lateral recess of effacement at L4-5 could affect the traversing LEFT L5 nerve.  Neural foraminal narrowing L2-3 and L4-5: Mild to moderate on the LEFT at L4-5.  Severe L4-5 facet arthropathy and reactive effusions.   Electronically Signed   By: Elon Alas M.D.   On: 06/29/2015 04:42    Other results: EKG: Sinus bradycardia (HR 59). No acute ST or T wave changes.   Assessment & Plan by Problem: Active Problems:   Syncope   Essential hypertension   Spinal stenosis, lumbar region, with neurogenic claudication  Syncope Pt stated she had chest pain and palpitations prior to the fall, as such, cardiogenic causes have to be considered. EKG did not show any acute ST or T wave changes. Troponin negative. CXR normal. Echo today showing EF 60% and mild LVH. However, study was technically  limited. Daughter states patient became "stiff" before falling on the ground. CT head did not show any masses, hemorrhage, or infarction. Physical exam did not show any focal neurological deficits. Palpitations not likely 2/2 hyperthyroidism, TSH normal. Psychosomatic causes are also on the differential.   -cardiac monitoring  -EEG pending -F/u am CBC -F/u am BMP -F/u CK -Pending EEG -Pain mgmt with fentanyl and hydrocodone  -OT eval and treat [facility])   Leg weakness B12 normal. CT of C-spine showing multilevel degenerative changes but no acute abnormality. X ray of lumbar spine (06/04/15) showing multilevel degenerative disk disease with no acute abnormality.    Erythrocytosis, leukocytosis CBC showing Hgb 16.0, normal MCV. WBC 14.8, Lymphocytes 46%. Normal platelet count. Records indicate her Hgb and WBC count have been elevated since 2011. Findings could be due to inflammation, however, ESR normal. 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   -F/u save smear  -F/u flow cytometry   COPD -cont Albuterol nebulizer prn   HTN -cont Amlodipine 2.5 mg qd -metoprolol 50 mg qhs  DM -holding metformin -F/u A1c   Depression -cont cymbalta 60 mg qhs  Peripheral neuropathy -Gabapentin 300 mg tid  DVT ppx: Lovenox  Code: FULL  Dispo: Disposition is deferred at this time, awaiting improvement of current medical problems. Anticipated discharge in approximately 1-2 day(s).   The patient does have a current PCP Celedonio Savage, MD) and does need an [facility] hospital follow-up appointment after discharge.  The patient does not have transportation limitations that hinder transportation to clinic appointments.  Signed: Shela Leff, MD 06/29/2015, 10:28 PM

## 2015-06-29 NOTE — ED Notes (Signed)
Pt returned from MRI °

## 2015-06-29 NOTE — ED Notes (Signed)
Attempted report 

## 2015-06-29 NOTE — ED Notes (Signed)
Pt transported to MRI 

## 2015-06-29 NOTE — ED Notes (Signed)
Ambulated pt w/ Joni Reining, [place]. Pt unsteady gait but able to bear weight - normal per pt to use walker at home with some difficulties going from sitting to standing position.

## 2015-06-29 NOTE — ED Notes (Signed)
Patient is resting comfortably. 

## 2015-06-29 NOTE — ED Notes (Signed)
Meal tray delivered.

## 2015-06-29 NOTE — Progress Notes (Signed)
  Echocardiogram 2D Echocardiogram has been performed.  Carla [name] 06/29/2015, 2:45 PM

## 2015-06-29 NOTE — ED Notes (Signed)
The patient is alert and oriented following commands upon arrival with [facility]. She was able to pull herself up in the bed and roll back and forth in the bed with no problem.

## 2015-06-29 NOTE — Evaluation (Signed)
Physical Therapy Evaluation Patient Details Name: Carla [name] MRN: [id number] DOB: [birth year] Today's Date: 06/29/2015   History of Present Illness  Patient is a 64 yo female admitted 06/28/15 following syncope/fall, with decreased BLE strength and inability to ambulate.  PMH:  arthritis, HTN, chronic back pain, COPD, weakness BLE's (prior fall), memory difficulty, pseudodementia    Clinical Impression  Patient presents with problems listed below.  Will benefit from acute PT to maximize functional mobility prior to returning home with family.  Noted patient's performance was fluctuating during PT session, regarding LE strength and mobility.  Recommend HHPT for home safety evaluation and therapy as indicated.    Follow Up Recommendations Home health PT;Supervision for mobility/OOB    Equipment Recommendations  None recommended by PT    Recommendations for Other Services       Precautions / Restrictions Precautions Precautions: Fall Precaution Comments: Several falls at home Restrictions Weight Bearing Restrictions: No      Mobility  Bed Mobility Overal bed mobility: Needs Assistance Bed Mobility: Supine to Sit;Sit to Supine     Supine to sit: Min guard;HOB elevated Sit to supine: Min assist;HOB elevated   General bed mobility comments: Verbal cues for technique.  Patient able to move LE's off of bed, at times using UE's to assist.  Able to move trunk to sitting position.  Good sitting balance at EOB.  Required assist to bring LE's onto bed to return to supine.  Transfers Overall transfer level: Needs assistance Equipment used: 1 person hand held assist Transfers: Sit to/from Stand Sit to Stand: Min assist;Min guard         General transfer comment: Verbal cues for hand placement.  Provided support at patient's knees to prevent buckling, and support for UE's.  Patient able to stand in flexed position for 20 seconds with effort.  At end of session, instructed patient  to scoot toward [facility] before returning to supine.  Patient stood with no physical assist and turned to straighten her bed pad.  Then took several steps toward [facility] before sitting. Provided min guard assist as patient initiated standing.  Ambulation/Gait             General Gait Details: Did not attempt during session due to patient's initial difficulty with standing.  Stairs            Wheelchair Mobility    Modified Rankin (Stroke Patients Only)       Balance Overall balance assessment: Needs assistance Sitting-balance support: No upper extremity supported;Feet supported Sitting balance-Leahy Scale: Good     Standing balance support: Single extremity supported Standing balance-Leahy Scale: Poor Standing balance comment: Requires UE support of varying degrees during session                             Pertinent Vitals/Pain Pain Assessment: 0-10 Pain Score: 9  Pain Location: Low back, hips primarily.  General pain all over. Pain Descriptors / Indicators: Aching;Constant;Spasm Pain Intervention(s): Limited activity within patient's tolerance;Monitored during session;Patient requesting pain meds-RN notified;RN gave pain meds during session;Repositioned    Home Living Family/patient expects to be discharged to:: Private residence Living Arrangements: Children;Other relatives (2 daughters, son-in-law, grandchildren) Available Help at Discharge: Family;Available 24 hours/day Type of Home: House Home Access: Stairs to enter Entrance Stairs-Rails: Doctor, general practice of Steps: 5 Home Layout: One level Home Equipment: Walker - 2 wheels;Wheelchair - manual;Cane - single point;Bedside commode;Other (comment) (Leg loop)  Prior Function Level of Independence: Needs assistance   Gait / Transfers Assistance Needed: Patient reports using a RW in the house with family supervision.  And that her family move her around the house by sliding her on a  chair.  Reports needing assist to move sit > stand, especially from toilet.  Reports she uses her w/c for mobility outside of the house.  ADL's / Homemaking Assistance Needed: Assist with shower, dressing, meal prep, housekeeping.        Hand Dominance        Extremity/Trunk Assessment   Upper Extremity Assessment: Overall WFL for tasks assessed (Functional decrease in shoulder ROM)           Lower Extremity Assessment: RLE deficits/detail;LLE deficits/detail RLE Deficits / Details: Strength tested at 3-/5.  However, patient able to stand up, maintaining upright position. LLE Deficits / Details: Strength tested at 3-/5.  However, patient able to stand up, maintaining upright position.  Cervical / Trunk Assessment: Normal  Communication   Communication: No difficulties  Cognition Arousal/Alertness: Awake/alert Behavior During Therapy: Restless;Anxious Overall Cognitive Status: History of cognitive impairments - at baseline       Memory: Decreased short-term memory (Patient reports she has "trouble" with her memory)              General Comments      Exercises        Assessment/Plan    PT Assessment Patient needs continued PT services  PT Diagnosis Difficulty walking;Generalized weakness;Acute pain   PT Problem List Decreased strength;Decreased activity tolerance;Decreased balance;Decreased mobility;Decreased knowledge of use of DME;Obesity;Pain  PT Treatment Interventions DME instruction;Gait training;Functional mobility training;Therapeutic activities;Therapeutic exercise;Patient/family education   PT Goals (Current goals can be found in the Care Plan section) Acute Rehab PT Goals Patient Stated Goal: To get better. PT Goal Formulation: With patient Time For Goal Achievement: 07/06/15 Potential to Achieve Goals: Good    Frequency Min 3X/week   Barriers to discharge        Co-evaluation               End of Session Equipment Utilized During  Treatment: Gait belt Activity Tolerance: Patient limited by fatigue;Patient limited by pain Patient left: in bed;with call bell/phone within reach;with bed alarm set Nurse Communication: Mobility status;Patient requests pain meds    Functional Assessment Tool Used: Clinical judgement Functional Limitation: Mobility: Walking and moving around Mobility: Walking and Moving Around Current Status [phone]): At least 40 percent but less than 60 percent impaired, limited or restricted Mobility: Walking and Moving Around Goal Status [phone]): At least 1 percent but less than 20 percent impaired, limited or restricted    Time: 0981-1914 PT Time Calculation (min) (ACUTE ONLY): 57 min   Charges:   PT Evaluation $Initial PT Evaluation Tier I: 1 Procedure PT Treatments $Therapeutic Activity: 23-37 mins   PT G Codes:   PT G-Codes **NOT FOR INPATIENT CLASS** Functional Assessment Tool Used: Clinical judgement Functional Limitation: Mobility: Walking and moving around Mobility: Walking and Moving Around Current Status (N8295): At least 40 percent but less than 60 percent impaired, limited or restricted Mobility: Walking and Moving Around Goal Status [phone]): At least 1 percent but less than 20 percent impaired, limited or restricted    Vena Austria 06/29/2015, 7:46 PM Durenda Hurt. Renaldo Fiddler, [facility] Acute Rehab Services Pager [phone]

## 2015-06-29 NOTE — ED Notes (Addendum)
PT ambulated to bedside commode with assistance; bears weight on legs and lifts them into bed; reports chronic lower back pain and bilateral knee pain, also chronic in nature. Reports "fugue" episodes where she does not know how she got from one place to another.

## 2015-06-29 NOTE — ED Notes (Signed)
Nicole, PA at bedside 

## 2015-06-29 NOTE — ED Provider Notes (Signed)
  Blood pressure 174/81, pulse 68, temperature 97.4 F (36.3 C), temperature source Oral, resp. rate 13, SpO2 95 %.  Carla [name] is a 64 y.o. female complaining of syncopal event onset last night. Patient states she was cooking dinner, she doesn't remember the fall. There was no specific prodrome. She denies any chest pain, shortness of breath, diaphoresis or nausea before the episode. She states she was feeling generally weak but this is typical for her she states her weakness is exacerbated in the afternoons. Patient's had multiple falls in the past but no loss of consciousness. Patient was transferred for [facility] after code stroke was initiated.  Patient states that she has severe chronic low back pain for which she sees [facility], she was scheduled for facet injections but states she cannot afford them. Patient takes Valium, hydrocodone and Flexeril at home. States that her difficulty ambulating secondary to vertiginous sensation which is exacerbated by head movement, this started last week. She is also exacerbated by the severe pain in her low back which radiates down the leg states that this is typical for her but is becoming more severe and says that the hydrocodone that she gets from her pain management clinic are no longer effective. Patient denies headache, change in vision, dysarthria, chest pain, abdominal pain, nausea and vomiting.  Neurosurg: Dutch Quint (no surgery, just facet injections) PCP: [facility]  Patient seen and evaluated the bedside, states her pain is severe, 9 out of 10. No facial asymmetry, no slurred speech. Pupils are equal round reactive to light, no midline C-spine tenderness fall range of motion to C-spine. No dysmetria on finger to nose. Lung sounds clear to auscultation heart is regular rate and rhythm. Abdominal exam is benign.  Attempted to ambulate patient, she normally ambulates with both a walker and sometimes uses a wheelchair.  She lives with her family and she is supervised because of her unsteadiness. Patient could not ambulate with assistance for 5 steps in the ED. Is been several hours since she received her last pain medication. Will give fluid bolus, fentanyl and Valium. Patient's syncopal event with no prodrome is concerning and I think she needs admission for further workup for that. States she has a history of hypertension but no cardiac abnormalities. Patient is an active daily smoker and borderline diabetic.  This will be an unassigned admission to family practice, discussed with resident Dr. Johna Roles who accepts admission to telemetry bed.   Joni Reining Tarius Stangelo, PA-C 06/29/15 [id number]  Derwood Kaplan, MD 07/02/15 [phone]

## 2015-06-29 NOTE — ED Notes (Signed)
Pt requesting to use bedside commode for toileting - states she can stand with assistance. This RN and EMT assisted pt to use bedside commode - pt able to bear weight with some difficulty. No weakness noted.

## 2015-06-29 NOTE — ED Notes (Signed)
Ok for pt to eat/drink per Dr. Rhunette Croft.

## 2015-06-30 ENCOUNTER — Observation Stay (HOSPITAL_COMMUNITY)
Admit: 2015-06-30 | Discharge: 2015-06-30 | Disposition: A | Discharge status: Home / Self Care | Payer: Medicare Other | Attending: Internal Medicine | Admitting: Internal Medicine

## 2015-06-30 ENCOUNTER — Observation Stay (HOSPITAL_COMMUNITY): Payer: Medicare Other

## 2015-06-30 DIAGNOSIS — R296 Repeated falls: Secondary | ICD-10-CM | POA: Diagnosis not present

## 2015-06-30 DIAGNOSIS — J449 Chronic obstructive pulmonary disease, unspecified: Secondary | ICD-10-CM

## 2015-06-30 DIAGNOSIS — R29898 Other symptoms and signs involving the musculoskeletal system: Secondary | ICD-10-CM | POA: Insufficient documentation

## 2015-06-30 DIAGNOSIS — D72829 Elevated white blood cell count, unspecified: Secondary | ICD-10-CM

## 2015-06-30 DIAGNOSIS — Z9181 History of falling: Secondary | ICD-10-CM

## 2015-06-30 DIAGNOSIS — R531 Weakness: Secondary | ICD-10-CM | POA: Diagnosis not present

## 2015-06-30 DIAGNOSIS — R55 Syncope and collapse: Secondary | ICD-10-CM | POA: Insufficient documentation

## 2015-06-30 DIAGNOSIS — I10 Essential (primary) hypertension: Secondary | ICD-10-CM

## 2015-06-30 DIAGNOSIS — M4800 Spinal stenosis, site unspecified: Secondary | ICD-10-CM

## 2015-06-30 DIAGNOSIS — D751 Secondary polycythemia: Secondary | ICD-10-CM | POA: Insufficient documentation

## 2015-06-30 DIAGNOSIS — Z7951 Long term (current) use of inhaled steroids: Secondary | ICD-10-CM

## 2015-06-30 DIAGNOSIS — F418 Other specified anxiety disorders: Secondary | ICD-10-CM

## 2015-06-30 LAB — HCV COMMENT:

## 2015-06-30 LAB — CBC WITH DIFFERENTIAL/PLATELET
BASOS ABS: 0 10*3/uL (ref 0.0–0.1)
Basophils Relative: 0 %
EOS ABS: 0 10*3/uL (ref 0.0–0.7)
Eosinophils Relative: 0 %
HEMATOCRIT: 48.5 % — AB (ref 36.0–46.0)
Hemoglobin: 16.3 g/dL — ABNORMAL HIGH (ref 12.0–15.0)
LYMPHS ABS: 6.4 10*3/uL — AB (ref 0.7–4.0)
LYMPHS PCT: 37 %
MCH: 30.5 pg (ref 26.0–34.0)
MCHC: 33.6 g/dL (ref 30.0–36.0)
MCV: 90.7 fL (ref 78.0–100.0)
MONOS PCT: 6 %
Monocytes Absolute: 1 10*3/uL (ref 0.1–1.0)
Neutro Abs: 9.9 10*3/uL — ABNORMAL HIGH (ref 1.7–7.7)
Neutrophils Relative %: 57 %
Platelets: 274 10*3/uL (ref 150–400)
RBC: 5.35 MIL/uL — AB (ref 3.87–5.11)
RDW: 14 % (ref 11.5–15.5)
WBC: 17.3 10*3/uL — AB (ref 4.0–10.5)

## 2015-06-30 LAB — BASIC METABOLIC PANEL
ANION GAP: 9 (ref 5–15)
BUN: 11 mg/dL (ref 6–20)
CALCIUM: 9.1 mg/dL (ref 8.9–10.3)
CO2: 27 mmol/L (ref 22–32)
CREATININE: 0.65 mg/dL (ref 0.44–1.00)
Chloride: 101 mmol/L (ref 101–111)
Glucose, Bld: 102 mg/dL — ABNORMAL HIGH (ref 65–99)
Potassium: 4.4 mmol/L (ref 3.5–5.1)
SODIUM: 137 mmol/L (ref 135–145)

## 2015-06-30 LAB — HEMOGLOBIN A1C
Hgb A1c MFr Bld: 6.5 % — ABNORMAL HIGH (ref 4.8–5.6)
Mean Plasma Glucose: 140 mg/dL

## 2015-06-30 LAB — ERYTHROPOIETIN: Erythropoietin: 81.7 m[IU]/mL — ABNORMAL HIGH (ref 2.6–18.5)

## 2015-06-30 LAB — HIV ANTIBODY (ROUTINE TESTING W REFLEX): HIV SCREEN 4TH GENERATION: NONREACTIVE

## 2015-06-30 LAB — HEPATITIS C ANTIBODY (REFLEX)

## 2015-06-30 LAB — CK: CK TOTAL: 29 U/L — AB (ref 38–234)

## 2015-06-30 MED ORDER — PANTOPRAZOLE SODIUM 40 MG PO TBEC
40.0000 mg | DELAYED_RELEASE_TABLET | Freq: Every day | ORAL | Status: DC
  Administered 2015-06-30: 40 mg via ORAL
  Filled 2015-06-30: qty 1

## 2015-06-30 MED ORDER — AMLODIPINE BESYLATE 5 MG PO TABS: 5.0000 mg | ORAL_TABLET | Freq: Every day | ORAL | Status: DC

## 2015-06-30 MED ORDER — METOPROLOL SUCCINATE ER 25 MG PO TB24: 25.0000 mg | ORAL_TABLET | Freq: Every day | ORAL | Status: DC

## 2015-06-30 MED ORDER — AMLODIPINE BESYLATE 2.5 MG PO TABS: 5.0000 mg | ORAL_TABLET | Freq: Every day | ORAL | Status: DC

## 2015-06-30 MED ORDER — METOPROLOL SUCCINATE ER 50 MG PO TB24: 25.0000 mg | ORAL_TABLET | Freq: Every day | ORAL | Status: DC

## 2015-06-30 MED ORDER — HYDROCODONE-ACETAMINOPHEN 5-325 MG PO TABS
1.0000 | ORAL_TABLET | Freq: Once | ORAL | Status: AC
  Administered 2015-06-30: 1 via ORAL
  Filled 2015-06-30: qty 1

## 2015-06-30 MED ORDER — DIAZEPAM 5 MG PO TABS
5.0000 mg | ORAL_TABLET | Freq: Three times a day (TID) | ORAL | Status: DC | PRN
  Administered 2015-06-30: 5 mg via ORAL
  Filled 2015-06-30: qty 1

## 2015-06-30 MED ORDER — OMEPRAZOLE 20 MG PO CPDR: 20.0000 mg | DELAYED_RELEASE_CAPSULE | Freq: Every day | ORAL | Status: DC

## 2015-06-30 NOTE — Evaluation (Signed)
Occupational Therapy Evaluation Patient Details Name: Carla [name] MRN: [id number] DOB: [birth year] Today's Date: 06/30/2015    History of Present Illness Patient is a 64 yo female admitted 06/28/15 following syncope/fall, with decreased BLE strength and inability to ambulate.  PMH:  arthritis, HTN, chronic back pain, COPD, weakness BLE's (prior fall), memory difficulty, pseudodementia   Clinical Impression   Pt admitted with above.  She presents with generalized weakness, decreased balance, and cognitive deficits.  She lives with her family who provide 24 hour supervision/assist.  She has all needed DME.  Recommend HHOT at discharge.   All further OT needs can be addressed with HHOT.  Acute OT will sign off.     Follow Up Recommendations  Home health OT;Supervision/Assistance - 24 hour    Equipment Recommendations  None recommended by OT    Recommendations for Other Services       Precautions / Restrictions Precautions Precautions: Fall Precaution Comments: Several falls at home      Mobility Bed Mobility Overal bed mobility: Needs Assistance Bed Mobility: Supine to Sit;Sit to Supine     Supine to sit: Min guard Sit to supine: Min assist   General bed mobility comments: increased time and assist to lift LEs onto bed   Transfers Overall transfer level: Needs assistance Equipment used: Rolling walker (2 wheeled) Transfers: Sit to/from [employer] Sit to Stand: Min guard Stand pivot transfers: Min guard            Balance Overall balance assessment: Needs assistance Sitting-balance support: Feet supported Sitting balance-Leahy Scale: Good     Standing balance support: Single extremity supported;Bilateral upper extremity supported Standing balance-Leahy Scale: Poor                              ADL Overall ADL's : Needs assistance/impaired Eating/Feeding: Independent   Grooming: Wash/dry hands;Wash/dry face;Oral care;Min  guard;Standing   Upper Body Bathing: Set up;Supervision/ safety;Sitting   Lower Body Bathing: Moderate assistance;Sit to/from stand   Upper Body Dressing : Minimal assistance;Sitting   Lower Body Dressing: Maximal assistance;Sit to/from stand   Toilet Transfer: Minimal assistance;Ambulation;Comfort height toilet;RW Statistician Details (indicate cue type and reason): Difficulty moving sit to stand from low surface Discussed use of 3in1 over commode Toileting- Clothing Manipulation and Hygiene: Minimal assistance;Sit to/from stand       Functional mobility during ADLs: Min guard       Vision Vision Assessment?: No apparent visual deficits   Perception     Praxis      Pertinent Vitals/Pain Pain Assessment: Faces Faces Pain Scale: Hurts little more Pain Location: low back and hips Pain Descriptors / Indicators: Aching;Constant Pain Intervention(s): Monitored during session     Hand Dominance Right   Extremity/Trunk Assessment Upper Extremity Assessment Upper Extremity Assessment: Generalized weakness   Lower Extremity Assessment Lower Extremity Assessment: Defer to PT evaluation       Communication Communication Communication: No difficulties   Cognition Arousal/Alertness: Awake/alert Behavior During Therapy: Flat affect Overall Cognitive Status: History of cognitive impairments - at baseline                     General Comments       Exercises       Shoulder Instructions      Home Living Family/patient expects to be discharged to:: Private residence Living Arrangements: Children;Other relatives (2 daughters, son-in-law, grandchildren) Available Help at Discharge: Family;Available 24 hours/day  Type of Home: House Home Access: Stairs to enter [employer] of Steps: 5 Entrance Stairs-Rails: Right;Left Home Layout: One level     Bathroom Shower/Tub: Producer, television/film/video: Standard     Home Equipment: Environmental consultant - 2  wheels;Wheelchair - manual;Cane - single point;Bedside commode;Other (comment) (Leg loop)          Prior Functioning/Environment Level of Independence: Needs assistance  Gait / Transfers Assistance Needed: Patient reports using a RW in the house with family supervision.  And that her family move her around the house by sliding her on a chair.  Reports needing assist to move sit > stand, especially from toilet.  Reports she uses her w/c for mobility outside of the house. ADL's / Homemaking Assistance Needed: Pt initially states she does all self care mod I, except on her bad days, but upon further questioning, she states she requires assist for LB ADLs - family has to help her don/doff socks, pants, etc.         OT Diagnosis: Generalized weakness;Cognitive deficits   OT Problem List: Decreased strength;Decreased activity tolerance;Impaired balance (sitting and/or standing);Decreased cognition;Decreased safety awareness;Decreased knowledge of use of DME or AE;Pain;Obesity   OT Treatment/Interventions:      OT Goals(Current goals can be found in the care plan section) Acute Rehab OT Goals Patient Stated Goal: To get better. OT Goal Formulation: All assessment and education complete, DC therapy  OT Frequency:     Barriers to D/C:            Co-evaluation              End of Session Equipment Utilized During Treatment: Rolling walker Nurse Communication: Mobility status  Activity Tolerance: Patient limited by fatigue Patient left: in bed;Other (comment) (transporter arrived to transport for procedure )   Time: 6244-6950 OT Time Calculation (min): 18 min Charges:  OT General Charges $OT Visit: 1 Procedure OT Evaluation $Initial OT Evaluation Tier I: 1 Procedure G-Codes: OT G-codes **NOT FOR INPATIENT CLASS** Functional Limitation: Self care Self Care Current Status (H2257): At least 20 percent but less than 40 percent impaired, limited or restricted Self Care Goal Status  (D0518): At least 20 percent but less than 40 percent impaired, limited or restricted Self Care Discharge Status [phone]): At least 20 percent but less than 40 percent impaired, limited or restricted  Conarpe, Wendi M 06/30/2015, 2:20 PM

## 2015-06-30 NOTE — Progress Notes (Signed)
Date: 06/30/2015  Patient name: Carla [name]  Medical record number: [id number]  Date of birth: July 10, 1951   I have seen and evaluated Carla [name] and discussed their care with the Residency Team. Briefly, Carla [name] is a 64yo woman with PMH of osteoarthritis, HTN, chronic back and knee pain, COPD, weakness in both legs who presents with syncope.  Patient reported that she had an episode of chest pain and then she fell.  The pain came on suddenly, reproducible, substernal and on the right.  It resolved after she fell.  She reports that she has daily chest pain which lasts only about a minute and then goes away.  Further symptoms included palpitations and sudden weakness which caused her to fall.  Her daughter reports that this did not happen, but her mother was standing in the kitchen, suddenly stopped talking, became stiff and fell to the floor.  She reports not hitting her head.  She had no jerking of her limbs, no loss of bowel or bladder.  She was confused for a period of time and was alert when EMS arrived.  She denies any nausea, diaphoresis, SOB with the chest pain.  She does have chronic SOB due to her COPD.    Another aspect of this history is that Carla [name] reports that her legs give out on her.  She has spinal stenosis, chronic back pain, chronic knee pain.  She reports progressive weakness as the days goes on.  She reports increased fatigue with walking and then her legs just give out.  She denies any seizure activity.  She has been in rehab before and she was supposed to have a walker, however, this has not been delivered to her house.  Carla [name] also reports being told that she has crystals in her ear that cause dizziness.  She denies any headache, focal weakness, fever, chills, slurring of speech, facial droop, numbness.  She reports difficulty with word finding, slurred speech occasionally (chronic), chronic fatigue and fugue episodes.  Her daughters relay a recent story of her  getting lost and not knowing them.  The police had to be called to get her home safely.    Family history negative for any musculoskeletal disorder.  She does not know her mother's family's history.  She is a smoker.   Exam:  Gen: appears fatigued, flat affect Eyes: EOMI, but she has issues with full movment, PERLLA HENT: NCAT, large neck CVS: RR, NR, no murmur Pulm: Decreased breath sounds on the left, no wheezing, no rales Abd: Soft, +BS, no mass noted Back: No CVA tenderness, no rash Ext: No edema, warmth Neuro:  No focal symptoms.  She is generally weak with 4/5 throughout in both her arms and legs and grip strength.  She has no numbness.  CN appear intact.  She has some slurring of her speech with prolonged interview, word finding difficulty, misspoken words that she denies having.  She reports balance issues, however, gait was not able to be tested due to increased fatigue.  + nystagmus to rightward gaze.   Pertinent data: Creatinine 0.6  CK 29 Vit B12 315 WBC 14.8, differential with mild elevated neut # Hgb 16 Hct 48 Epo level 81.7 (high) A1C 6.5  UA clear  CT head, as read by Dr. Jasmine December IMPRESSION: No demonstrable acute appearing infarct. Gray-white compartments are stable with minimal small vessel disease. No hemorrhage or mass Effect.  CT C Spine as read by Dr. Rosana Hoes IMPRESSION: No evidence for acute  cervical spine fracture. Multilevel degenerative changes.  CT Lumbar Spine as read by Dr. Derrel Nip IMPRESSION: Mild spondylosis of the lumbar spine with multilevel disc disease as described. No definite focal disc herniation. There is moderate canal stenosis at the L2-3 and L4-5 levels as well as mild canal stenosis at the L3-4 level unchanged. Bilateral neural foraminal narrowing multiple levels unchanged.  MRI L spine as read by Dr. Dorann Lodge IMPRESSION: No acute lumbar spine fracture. Minimal grade 1 L4-5 anterolisthesis on degenerative basis.  Moderate to severe  canal stenosis at L4-5, moderate canal stenosis at L2-3. Partial lateral recess of effacement at L4-5 could affect the traversing LEFT L5 nerve.  Neural foraminal narrowing L2-3 and L4-5: Mild to moderate on the LEFT at L4-5.  Severe L4-5 facet arthropathy and reactive effusions.  Assessment and Plan: I have seen and evaluated the patient as outlined above with the resident team.   [name] has an interesting history and multiple complaints.  Her main complaints boil down to Syncope of unknown cause currently (differential including vasovagal, cardiogenic, stroke, seizure), weakness which is generalized and worsening (no focal weakness, but very bad difficulty with walking), and erythrocytosis as seen on blood work (chronic per chart review)  1. Syncope - 2 stories from patient and daughter were illicited.  Will work up for seizure and cardiogenic causes - EEG is pending - Telemetry monitoring - EKG - She had some bradycardia on EKG and telemetry, would d/c metoprolol and start another medication - CK normal - WBC elevated, but no clear sign of infection present (normal CXR and UA), trend WBC - OT and PT evaluation  2. Generalized weakness, spinal stenosis, frequent falls - Spinal stenosis could explain leg weakness and pain, however, she is also having upper extremity weakness. CK and ESR are normal, making polymyositis unlikely - She had an interesting affect and also seemed to fatigue during our interview (> 30 min).  Would check screening labs for Myasthenia gravis and consider other neuromuscular diagnoses in the differential.  Would also do MRI of head.  She has no focal findings that have me very concerned for stroke, but her slurring and weakness are concerning - For spinal stenosis, will treat pain.  She does report falls related to her legs "giving out" - PT/OT  3. Erythrocytosis, leukocytosis - The elevated RBC's can be explained by her history of smoking, Epo level high -  Flow cytometry of the blood to eval leukocytosis, smear - Abdominal ultrasound to evaluate kidneys  4. HTN - Hold metoprolol - Amlodipine - Trend Cr  5. Anxiety/Depression - I wonder if some of her symptoms  May be psychosomatic.  Above work up will be initiated as this would be a diagnosis of exclusion.  If most comes back normal, consider psychiatry evaluation.    Sid Falcon, MD 9/17/20168:24 AM

## 2015-06-30 NOTE — Discharge Instructions (Signed)
-  Instead of toprol-XL 50 mg daily start taking 25 mg daily, we sent this new prescription to your pharmacy -Increase your amlodipine from 2.5 mg daily to 5 mg daily   -Start taking prilosec 20 mg daily for acid reflux  -Home health will be coming to your house for physical and occupational therapy -Please follow-up with your primary care doctor in 1 week and also with your psychiatrist  -Our office will call you to make an appointment with Dr. Cyndie Chime to further evaluate your abnormal blood counts

## 2015-06-30 NOTE — Progress Notes (Signed)
EEG Completed; Results Pending  

## 2015-06-30 NOTE — Progress Notes (Signed)
Subjective: Pt seen and examined at bedside today. States she is doing better. Denies any chest pain, SOB, or episodes of syncope at present. States the chest pain she gets at home is substernal and associated with nausea and a bitter taste in the mouth when lying down. Patient does not take any medication for GERD at home. States she still continues to have leg weakness.   Objective:  Vital signs in last 24 hours: Filed Vitals:   06/29/15 2000 06/29/15 2200 06/30/15 0556 06/30/15 0900  BP: 152/60 172/65 148/78 155/55  Pulse: 76 71 70 57  Temp: 98.1 F (36.7 C) 98.1 F (36.7 C) 97.7 F (36.5 C) 97.7 F (36.5 C)  TempSrc: Oral Oral Oral Oral  Resp: '20 22 20 18  ' Height:      Weight:      SpO2: 97% 93% 99% 96%   Weight change:  No intake or output data in the 24 hours ending 06/30/15 1241  Physical Exam  Constitutional: She is oriented to person, place, and time. She appears well-developed and well-nourished. No distress.  HENT:  Head: Normocephalic and atraumatic.  Eyes: EOM are normal. Pupils are equal, round, and reactive to light.  Neck: Neck supple. No tracheal deviation present.  Cardiovascular: Normal rate, regular rhythm and intact distal pulses. Exam reveals no gallop and no friction rub.  No murmur heard. Pulmonary/Chest: Effort normal. No respiratory distress. She has no wheezes. She has no rales.  Abdominal: Soft. Bowel sounds are normal. She exhibits no distension. There is no tenderness.  Musculoskeletal: She exhibits no edema.  Neurological: She is alert and oriented to person, place, and time. No cranial nerve deficit.  Sensation grossly intact in b/l upper and lower extremities. Strength 4/5 in bilateral upper and lower extremities.  Skin: Skin is warm and dry. She is not diaphoretic.   Lab Results: Basic Metabolic Panel:  Recent Labs Lab 06/28/15 1908 06/28/15 2016 06/29/15 1441 06/30/15 0737  NA 134* 139  --  137  K 3.7 3.6  --  4.4  CL 100*  99*  --  101  CO2 26  --   --  27  GLUCOSE 95 98  --  102*  BUN 14 14  --  11  CREATININE 0.63 0.60  --  0.65  CALCIUM 9.0  --   --  9.1  MG  --   --  1.8  --   PHOS  --   --  2.8  --    Liver Function Tests:  Recent Labs Lab 06/28/15 1908  AST 26  ALT 25  ALKPHOS 105  BILITOT 0.2*  PROT 6.2*  ALBUMIN 3.4*   CBC:  Recent Labs Lab 06/28/15 2012 06/28/15 2016 06/30/15 0737  WBC 14.8*  --  17.3*  NEUTROABS 6.3  --  9.9*  HGB 16.0* 17.3* 16.3*  HCT 48.0* 51.0* 48.5*  MCV 92.0  --  90.7  PLT 291  --  274   Cardiac Enzymes:  Recent Labs Lab 06/30/15 0737  CKTOTAL 29*   Hemoglobin A1C:  Recent Labs Lab 06/29/15 1441  HGBA1C 6.5*   Thyroid Function Tests:  Recent Labs Lab 06/29/15 1441  TSH 1.912   Coagulation:  Recent Labs Lab 06/28/15 1908  LABPROT 13.5  INR 1.01   Anemia Panel:  Recent Labs Lab 06/29/15 1441  VITAMINB12 315   Urine Drug Screen: Drugs of Abuse     Component Value Date/Time   LABOPIA NONE DETECTED 06/28/2015 1930  COCAINSCRNUR NONE DETECTED 06/28/2015 1930   LABBENZ POSITIVE* 06/28/2015 1930   AMPHETMU NONE DETECTED 06/28/2015 1930   THCU NONE DETECTED 06/28/2015 1930   LABBARB NONE DETECTED 06/28/2015 1930    Alcohol Level:  Recent Labs Lab 06/28/15 2012  ETH <5   Urinalysis:  Recent Labs Lab 06/28/15 1940  COLORURINE YELLOW  LABSPEC <1.005*  PHURINE 6.0  GLUCOSEU NEGATIVE  HGBUR NEGATIVE  BILIRUBINUR NEGATIVE  KETONESUR NEGATIVE  PROTEINUR NEGATIVE  UROBILINOGEN 0.2  NITRITE NEGATIVE  LEUKOCYTESUR NEGATIVE    Micro Results: No results found for this or any previous visit (from the past 240 hour(s)). Studies/Results: Dg Chest 2 View  06/29/2015   CLINICAL DATA:  Syncope yesterday.  EXAM: CHEST  2 VIEW  COMPARISON:  04/27/2015  FINDINGS: The heart size and mediastinal contours are within normal limits. Both lungs are clear. The visualized skeletal structures are unremarkable.  IMPRESSION: No  active cardiopulmonary disease.   Electronically Signed   By: Rolm Baptise M.D.   On: 06/29/2015 14:19   Ct Head Wo Contrast  06/28/2015   CLINICAL DATA:  Acute onset syncope and headache  EXAM: CT HEAD WITHOUT CONTRAST  TECHNIQUE: Contiguous axial images were obtained from the base of the skull through the vertex without intravenous contrast.  COMPARISON:  June 05, 2015  FINDINGS: The ventricles are normal in size and configuration. There is no intracranial mass, hemorrhage, extra-axial fluid collection, or midline shift. Gray-white compartments are normal except for a slight degree of small vessel disease in the left corona radiata, stable. No acute infarct evident. Middle cerebral arteries appear symmetric without increased attenuation on either side. Bony calvarium appears intact. The mastoid air cells are clear.  IMPRESSION: No demonstrable acute appearing infarct. Gray-white compartments are stable with minimal small vessel disease. No hemorrhage or mass effect.  Critical Value/emergent results were called by telephone at the time of interpretation on 06/28/2015 at 7:18 pm to Dr. Fredia Sorrow , who verbally acknowledged these results.   Electronically Signed   By: Lowella Grip III M.D.   On: 06/28/2015 19:18   Ct Cervical Spine Wo Contrast  06/28/2015   CLINICAL DATA:  Patient status post fall.  EXAM: CT CERVICAL SPINE WITHOUT CONTRAST  TECHNIQUE: Multidetector CT imaging of the cervical spine was performed without intravenous contrast. Multiplanar CT image reconstructions were also generated.  COMPARISON:  Brain CT earlier same day  FINDINGS: Normal anatomic alignment. No evidence for acute fracture dislocation. Craniocervical junction is intact. Degenerative disc disease most pronounced C5-6 and C7-T1. Prevertebral soft tissues unremarkable. Nonspecific mosaic attenuation of the lung apices bilaterally.  IMPRESSION: No evidence for acute cervical spine fracture. Multilevel degenerative  changes.   Electronically Signed   By: Lovey Newcomer M.D.   On: 06/28/2015 19:37   Ct Lumbar Spine Wo Contrast  06/28/2015   CLINICAL DATA:  Patient states no feeling below waist.  No injury.  EXAM: CT LUMBAR SPINE WITHOUT CONTRAST  TECHNIQUE: Multidetector CT imaging of the lumbar spine was performed without intravenous contrast administration. Multiplanar CT image reconstructions were also generated.  COMPARISON:  MRI lumbar spine 03/13/2015 and CT 06/04/2015  FINDINGS: Vertebral body alignment and heights are normal. There is mild spondylosis throughout the lumbar spine. There is mild disc space narrowing at the L2-3 and L4-5 levels. There is no compression fracture or subluxation.  Axial images at the L1-2 level demonstrate no disc herniation, canal stenosis or significant neural foraminal narrowing. Facet arthropathy is present.  The L2-3 level demonstrates  a moderate broad-based disc bulge without focal disc herniation. There is moderate canal stenosis due to the disc disease and facet arthropathy unchanged. Moderate bilateral neural foraminal narrowing unchanged. Moderate facet arthropathy is present.  L3-4 level demonstrates a broad-based disc bulge without focal disc herniation. There is mild canal stenosis due to the disc disease and facet arthropathy. No significant neural foraminal narrowing.  The L4-5 level demonstrates a moderate broad-based disc bulge without focal disc herniation. There is moderate canal stenosis due to disc disease and facet arthropathy unchanged. Bilateral neural foraminal narrowing unchanged.  The L5-S1 level demonstrates no disc herniation, canal stenosis or significant neural foraminal narrowing. Mild facet arthropathy.  Degenerative change of the right sacroiliac joint. There is calcified plaque over the abdominal aorta.  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 No definite focal disc herniation. There is moderate canal  stenosis at the L2-3 and L4-5 levels as well as mild canal stenosis at the L3-4 level unchanged. Bilateral neural foraminal narrowing multiple levels unchanged.   Electronically Signed   By: Marin Olp M.D.   On: 06/28/2015 22:04   Mr Lumbar Spine Wo Contrast  06/29/2015   CLINICAL DATA:  Syncopal episode while cooking dinner, unable to move legs. Headache and confusion. Chronic back pain.  EXAM: MRI LUMBAR SPINE WITHOUT CONTRAST  TECHNIQUE: Multiplanar, multisequence MR imaging of the lumbar spine was performed. No intravenous contrast was administered.  COMPARISON:  CT lumbar spine June 28, 2015  FINDINGS: Lumbar vertebral bodies and posterior elements intact. Using the reference level of the last well-formed intervertebral disc as L5-S1, minimal L4-5 anterolisthesis without spondylolysis. Mild L2-3 and L5-S1 disc height loss. Decreased T2 signal within all lumbar discs consistent with mild desiccation. Mild chronic discogenic endplate changes at all lumbar levels. No STIR signal abnormality to suggest acute osseous process.  Conus medullaris terminates at L1 and appears normal morphology and signal characteristics. Cauda equina is centrally displaced due to canal stenosis and otherwise normal. Included prevertebral and paraspinal soft tissues are normal.  Level by level evaluation:  T12-L1, L1-2: No significant disc bulge. Mild facet arthropathy without canal stenosis or neural foraminal narrowing.  L2-3: Moderate broad-based disc bulge. Moderate facet arthropathy and ligamentum flavum redundancy. Moderate canal stenosis. Mild RIGHT neural foraminal narrowing.  L3-4: Annular bulging, moderate facet arthropathy and ligamentum flavum redundancy. Mild canal stenosis without neural foraminal narrowing.  L4-5: Anterolisthesis. Small broad-based disc bulge. Severe facet arthropathy and ligamentum flavum redundancy with trace facet effusions which are likely reactive. Moderate to severe canal stenosis,  partially effaced LEFT lateral recess likely affects the traversing LEFT L5 nerve. Mild to moderate LEFT neural foraminal narrowing.  L5-S1: No disc bulge. Moderate to severe RIGHT, mild LEFT facet arthropathy. 4 mm RIGHT facet synovial cyst extends medially into the dorsal epidural space. No canal stenosis or neural foraminal narrowing.  IMPRESSION: No acute lumbar spine fracture. Minimal grade 1 L4-5 anterolisthesis on degenerative basis.  Moderate to severe canal stenosis at L4-5, moderate canal stenosis at L2-3. Partial lateral recess of effacement at L4-5 could affect the traversing LEFT L5 nerve.  Neural foraminal narrowing L2-3 and L4-5: Mild to moderate on the LEFT at L4-5.  Severe L4-5 facet arthropathy and reactive effusions.   Electronically Signed   By: Elon Alas M.D.   On: 06/29/2015 04:42   Medications: I have reviewed the patient's current medications. Scheduled Meds: . [START ON 07/01/2015] amLODipine  5 mg Oral Daily  . DULoxetine  60 mg Oral QHS  . enoxaparin (LOVENOX) injection  40 mg Subcutaneous Q24H  . gabapentin  300 mg Oral TID  . metoprolol succinate  25 mg Oral QHS  . nicotine  21 mg Transdermal Daily  . sodium chloride  3 mL Intravenous Q12H   Continuous Infusions:  PRN Meds:.albuterol, diazepam, HYDROcodone-acetaminophen Assessment/Plan: Active Problems:   Syncope   Essential hypertension   Spinal stenosis, lumbar region, with neurogenic claudication   Bilateral leg weakness   Erythrocytosis   Syncope and collapse  Syncope Pt stated she had chest pain and palpitations prior to the fall, as such, cardiogenic causes have to be considered. EKG did not show any acute ST or T wave changes. Troponin negative. CXR normal. Echo today showing EF 60% and mild LVH. However, study was technically limited. Chest pain is subternal, a/w nausea and bitter tatste in mouth. Most likely GERD. Daughter states patient became "stiff" before falling on the ground. CT head did not  show any masses, hemorrhage, or infarction. CK low 29. Physical exam did not show any focal neurological deficits. Palpitations not likely 2/2 hyperthyroidism, TSH normal. Orthostatic vitals were normal. Psychosomatic causes are also on the differential.  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 CT of C-spine showing multilevel degenerative changes but no acute abnormality. X ray of lumbar spine (06/04/15) showing multilevel degenerative disk disease with no acute abnormality. MRI of lumbar spine showing moderate to severe canal stenosis at L4-5, moderate canal stenosis at L2-3. Mild to moderate neural foraminal narrowing at L2-3 and L4-5. In addition, severe L4-5 facet arthropathy and reactive effusions. Pt states she has been to PT and it has not helped her. Will make suggestion to PCP for possible outpatient neurosurgery referral.  -F/u workup for myasthenia gravis  Erythrocytosis, leukocytosis CBC showing Hgb 16.3, normal MCV. WBC 17.3, Lymphocytes 37%. Normal platelet count. Records indicate her Hgb and WBC count have been elevated since 2011. High WBC count could be due to inflammation, however, ESR normal. 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  -F/u save smear  -F/u flow cytometry  -pending renal US to look for a renal mass.   COPD -cont Albuterol nebulizer prn   HTN -cont Amlodipine increased to 5 mg qd -metoprolol reduced to 25 mg qd   DM: A1c 6.5.  -holding metformin  Depression -cont Cymbalta 60 mg qhs  Peripheral neuropathy -Gabapentin 300 mg tid  GERD: Protonix 40 mg qd  DVT ppx: Lovenox  Code: FULL  Dispo: Disposition is deferred at this time,  awaiting improvement of current medical problems.  Anticipated discharge in approximately 1-2 day(s).   The patient does have a current PCP Celedonio Savage, MD) and does need an [facility] hospital follow-up appointment after discharge.  The patient does not have transportation limitations that hinder transportation to clinic appointments.  .Services Needed at time of discharge: Y = Yes, Blank = No PT:   OT:   RN:   Equipment:   Other:       Shela Leff, MD 06/30/2015, 12:41 PM

## 2015-06-30 NOTE — Discharge Summary (Signed)
Name: Carla [name] MRN: [id number] DOB: 1951/05/12 64 y.o. PCP: Celedonio Savage, MD  Date of Admission: 06/28/2015  7:01 PM Date of Discharge: 07/01/2015 Attending Physician: No att. providers found  Discharge Diagnosis:  Active Problems:   Syncope   Essential hypertension   Spinal stenosis, lumbar region, with neurogenic claudication   Bilateral leg weakness   Erythrocytosis   Syncope and collapse  Discharge Medications:   Medication List    STOP taking these medications        naproxen 500 MG tablet  Commonly known as:  NAPROSYN      TAKE these medications        albuterol 108 (90 BASE) MCG/ACT inhaler  Commonly known as:  PROVENTIL HFA;VENTOLIN HFA  Inhale 2 puffs into the lungs every 6 (six) hours as needed for wheezing.     amLODipine 2.5 MG tablet  Commonly known as:  NORVASC  Take 2 tablets (5 mg total) by mouth daily.     cyclobenzaprine 10 MG tablet  Commonly known as:  FLEXERIL  Take 10 mg by mouth 3 (three) times daily as needed for muscle spasms.     diazepam 5 MG tablet  Commonly known as:  VALIUM  Take 5 mg by mouth 3 (three) times daily as needed for anxiety.     diphenhydrAMINE 25 MG tablet  Commonly known as:  BENADRYL  Take 2 tablets (50 mg total) by mouth every 4 (four) hours as needed for itching.     DULoxetine 60 MG capsule  Commonly known as:  CYMBALTA  Take 60 mg by mouth at bedtime.     EVZIO 0.4 MG/0.4ML Soaj  Generic drug:  Naloxone HCl  Inject 0.4 mLs into the muscle once as needed (for Anaphylaxis/allergic reaction).     gabapentin 300 MG capsule  Commonly known as:  NEURONTIN  Take 300 mg by mouth 3 (three) times daily.     HYDROcodone-acetaminophen 10-325 MG per tablet  Commonly known as:  NORCO  Take 1 tablet by mouth every 6 (six) hours as needed for moderate pain or severe pain.     hydrOXYzine 25 MG capsule  Commonly known as:  VISTARIL  Take 25 mg by mouth at bedtime.     metFORMIN 500 MG 24 hr tablet  Commonly  known as:  GLUCOPHAGE-XR  Take 500 mg by mouth at bedtime.     metoprolol succinate 25 MG 24 hr tablet  Commonly known as:  TOPROL-XL  Take 1 tablet (25 mg total) by mouth at bedtime.     omeprazole 20 MG capsule  Commonly known as:  PRILOSEC  Take 1 capsule (20 mg total) by mouth daily.     VOLTAREN 1 % Gel  Generic drug:  diclofenac sodium  Apply 4 g topically every 8 (eight) hours.        Disposition and follow-up:   Carla [name] was discharged from [facility] in Stable condition.  At the hospital follow up visit please address:  1. Syncope: No cardiac or neurological causes could be identified. Orthostatic vitals normal.  Any more episodes of syncope? Consider outpatient Holter monitor. Make sure patient follows up with her psychiatrist.   Bilateral leg weakness: MRI of lumbar spine Wo contrast showing moderate to severe canal stenosis at L4-5, moderate canal stenosis at L2-3. Mild to moderate neural foraminal narrowing at L2-3 and L4-5. In addition, severe L4-5 facet arthropathy and reactive effusions. Pt states she has been to PT and  it has not helped her. Please consider outpatient neurosurgery referral.   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 Please follow up flow cytometry results.   2.  Labs / imaging needed at time of follow-up: CBC  3.  Pending labs/ test needing follow-up: Flow cytometry, Acetylcholine receptor antibody, Striated muscle antibody  Follow-up Appointments: Follow-up Information    Follow up with Celedonio Savage, MD.   Specialty:  Family Medicine   Why:  Call to make an appt in 1 week   Contact information:   [address] [place] [address] [phone]       Follow up with Annia Belt, MD.   Specialty:  Oncology   Why:  Our office will call you to set up an appt next week   Contact information:   [address] [place] [address] [phone]        Discharge Instructions:  -Instead of toprol-XL 50 mg daily start taking 25 mg daily, we sent this new prescription to your pharmacy -Increase your amlodipine from 2.5 mg daily to 5 mg daily   -Start taking prilosec 20 mg daily for acid reflux  -Home health will be coming to your house for physical and occupational therapy -Please follow-up with your primary care doctor in 1 week and also with your psychiatrist  -Our office will call you to make an appointment with Dr. Beryle Beams to further evaluate your abnormal blood counts  Consultations:    Procedures Performed:  Dg Chest 2 View  06/29/2015   CLINICAL DATA:  Syncope yesterday.  EXAM: CHEST  2 VIEW  COMPARISON:  04/27/2015  FINDINGS: The heart size and mediastinal contours are within normal limits. Both lungs are clear. The visualized skeletal structures are unremarkable.  IMPRESSION: No active cardiopulmonary disease.   Electronically Signed   By: Rolm Baptise M.D.   On: 06/29/2015 14:19   Dg Lumbar Spine Complete  06/04/2015   CLINICAL DATA:  Acute lower back pain after fall in the shower.  EXAM: LUMBAR SPINE - COMPLETE 4+ VIEW  COMPARISON:  None.  FINDINGS: No fracture or spondylolisthesis is noted. Mild degenerative disc disease is noted at L2-3 and L4-5. Anterior osteophyte formation is noted at these levels as well as L3-4. Posterior facet joints appear normal.  IMPRESSION: Mild multilevel degenerative disc disease. No acute abnormality seen in the lumbar spine.   Electronically Signed   By: Marijo Conception, M.D.   On: 06/04/2015 15:33   Dg Pelvis 1-2 Views  06/04/2015   CLINICAL DATA:  Fall, bilateral knee pain  EXAM: PELVIS - 1-2 VIEW  COMPARISON:  None.  FINDINGS: There is no evidence of pelvic fracture or diastasis. No pelvic bone lesions are seen.  IMPRESSION: Negative.   Electronically Signed   By: Lahoma Crocker M.D.   On: 06/04/2015 15:28   Ct Head Wo Contrast  06/28/2015   CLINICAL DATA:  Acute onset syncope and headache   EXAM: CT HEAD WITHOUT CONTRAST  TECHNIQUE: Contiguous axial images were obtained from the base of the skull through the vertex without intravenous contrast.  COMPARISON:  June 05, 2015  FINDINGS: The ventricles are normal in size and configuration. There is no intracranial mass, hemorrhage, extra-axial fluid collection, or midline shift. Gray-white compartments are normal except for a slight degree of small vessel disease in the left corona radiata, stable. No acute infarct evident. Middle cerebral arteries appear symmetric without increased attenuation on either side. Bony calvarium appears intact.  The mastoid air cells are clear.  IMPRESSION: No demonstrable acute appearing infarct. Gray-white compartments are stable with minimal small vessel disease. No hemorrhage or mass effect.  Critical Value/emergent results were called by telephone at the time of interpretation on 06/28/2015 at 7:18 pm to Dr. Fredia Sorrow , who verbally acknowledged these results.   Electronically Signed   By: Lowella Grip III M.D.   On: 06/28/2015 19:18   Ct Head Wo Contrast  06/05/2015   CLINICAL DATA:  Fall 8-22 in shower landing on knees; bilateral leg pain since fall; pt states she has been off balance since falling yesterday;  EXAM: CT HEAD WITHOUT CONTRAST  CT CERVICAL SPINE WITHOUT CONTRAST  TECHNIQUE: Multidetector CT imaging of the head and cervical spine was performed following the standard protocol without intravenous contrast. Multiplanar CT image reconstructions of the cervical spine were also generated.  COMPARISON:  01/21/2015  FINDINGS: CT HEAD FINDINGS  Ventricles are normal in size and configuration. There are no parenchymal masses or mass effect. No evidence of an infarct. There are no extra-axial masses or abnormal fluid collections.  There is no intracranial hemorrhage.  Mild mucosal thickening lines the left maxillary sinus. There is a small amount of dependent fluid in the left maxillary sinus. Remaining  visualized sinuses are clear. Clear middle ear cavities and mastoid air cells. No skull fracture.  CT CERVICAL SPINE FINDINGS  No fracture. No spondylolisthesis. There is moderate loss of disc height with endplate osteophytes at C5-C6. Endplate osteophytes are also noted at C4-C5 and C7-T1. Uncovertebral spurring leads to moderate neural foraminal narrowing on the left at C5-C6. No other significant stenosis.  Soft tissues are unremarkable.  Lung apices are clear.  IMPRESSION: HEAD CT: No intracranial abnormality. No skull fracture. Left maxillary sinus mucosal thickening with dependent fluid.  CERVICAL CT:  No fracture or acute finding.   Electronically Signed   By: Lajean Manes M.D.   On: 06/05/2015 20:13   Ct Cervical Spine Wo Contrast  06/28/2015   CLINICAL DATA:  Patient status post fall.  EXAM: CT CERVICAL SPINE WITHOUT CONTRAST  TECHNIQUE: Multidetector CT imaging of the cervical spine was performed without intravenous contrast. Multiplanar CT image reconstructions were also generated.  COMPARISON:  Brain CT earlier same day  FINDINGS: Normal anatomic alignment. No evidence for acute fracture dislocation. Craniocervical junction is intact. Degenerative disc disease most pronounced C5-6 and C7-T1. Prevertebral soft tissues unremarkable. Nonspecific mosaic attenuation of the lung apices bilaterally.  IMPRESSION: No evidence for acute cervical spine fracture. Multilevel degenerative changes.   Electronically Signed   By: Lovey Newcomer M.D.   On: 06/28/2015 19:37   Ct Cervical Spine Wo Contrast  06/05/2015   CLINICAL DATA:  Fall 8-22 in shower landing on knees; bilateral leg pain since fall; pt states she has been off balance since falling yesterday;  EXAM: CT HEAD WITHOUT CONTRAST  CT CERVICAL SPINE WITHOUT CONTRAST  TECHNIQUE: Multidetector CT imaging of the head and cervical spine was performed following the standard protocol without intravenous contrast. Multiplanar CT image reconstructions of the  cervical spine were also generated.  COMPARISON:  01/21/2015  FINDINGS: CT HEAD FINDINGS  Ventricles are normal in size and configuration. There are no parenchymal masses or mass effect. No evidence of an infarct. There are no extra-axial masses or abnormal fluid collections.  There is no intracranial hemorrhage.  Mild mucosal thickening lines the left maxillary sinus. There is a small amount of dependent fluid in the left maxillary sinus. Remaining  visualized sinuses are clear. Clear middle ear cavities and mastoid air cells. No skull fracture.  CT CERVICAL SPINE FINDINGS  No fracture. No spondylolisthesis. There is moderate loss of disc height with endplate osteophytes at C5-C6. Endplate osteophytes are also noted at C4-C5 and C7-T1. Uncovertebral spurring leads to moderate neural foraminal narrowing on the left at C5-C6. No other significant stenosis.  Soft tissues are unremarkable.  Lung apices are clear.  IMPRESSION: HEAD CT: No intracranial abnormality. No skull fracture. Left maxillary sinus mucosal thickening with dependent fluid.  CERVICAL CT:  No fracture or acute finding.   Electronically Signed   By: Lajean Manes M.D.   On: 06/05/2015 20:13   Ct Lumbar Spine Wo Contrast  06/28/2015   CLINICAL DATA:  Patient states no feeling below waist.  No injury.  EXAM: CT LUMBAR SPINE WITHOUT CONTRAST  TECHNIQUE: Multidetector CT imaging of the lumbar spine was performed without intravenous contrast administration. Multiplanar CT image reconstructions were also generated.  COMPARISON:  MRI lumbar spine 03/13/2015 and CT 06/04/2015  FINDINGS: Vertebral body alignment and heights are normal. There is mild spondylosis throughout the lumbar spine. There is mild disc space narrowing at the L2-3 and L4-5 levels. There is no compression fracture or subluxation.  Axial images at the L1-2 level demonstrate no disc herniation, canal stenosis or significant neural foraminal narrowing. Facet arthropathy is present.  The L2-3  level demonstrates a moderate broad-based disc bulge without focal disc herniation. There is moderate canal stenosis due to the disc disease and facet arthropathy unchanged. Moderate bilateral neural foraminal narrowing unchanged. Moderate facet arthropathy is present.  L3-4 level demonstrates a broad-based disc bulge without focal disc herniation. There is mild canal stenosis due to the disc disease and facet arthropathy. No significant neural foraminal narrowing.  The L4-5 level demonstrates a moderate broad-based disc bulge without focal disc herniation. There is moderate canal stenosis due to disc disease and facet arthropathy unchanged. Bilateral neural foraminal narrowing unchanged.  The L5-S1 level demonstrates no disc herniation, canal stenosis or significant neural foraminal narrowing. Mild facet arthropathy.  Degenerative change of the right sacroiliac joint. There is calcified plaque over the abdominal aorta.  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 No definite focal disc herniation. There is moderate canal stenosis at the L2-3 and L4-5 levels as well as mild canal stenosis at the L3-4 level unchanged. Bilateral neural foraminal narrowing multiple levels unchanged.   Electronically Signed   By: Marin Olp M.D.   On: 06/28/2015 22:04   Ct Knee Right Wo Contrast  06/04/2015   CLINICAL DATA:  Fall onto knees in shower today with bilateral knee pain. Irregularity along the patella on conventional radiography, raising the possibility of fracture.  EXAM: CT OF THE right KNEE WITHOUT CONTRAST  TECHNIQUE: Multidetector CT imaging of the a right knee was performed according to the standard protocol. Multiplanar CT image reconstructions were also generated.  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 Loss of articular space most notable in the medial compartment but especially  in the patellofemoral joint.  Chronically fragmented spurring from the lateral margin of the patella noted, well corticated and not thought to represent an acute fracture. There is essentially full-thickness loss of articular cartilage in the patellofemoral joint.  Posterior to the expected position of the posterior horn lateral meniscus there is a ossific structure on image 22 of series 3 probably representing a chronically  fragmented osteophyte. A free osteochondral fragment in the popliteus recess is possible.  No overt knee effusion.  IMPRESSION: 1. Severe tricompartmental spurring with some chronic fragmentation. I do not observe a fracture or knee effusion. I cannot completely exclude a free osteochondral fragment posterolaterally.   Electronically Signed   By: Van Clines M.D.   On: 06/04/2015 17:00   Mr Brain Wo Contrast  06/30/2015   CLINICAL DATA:  Episode of loss of consciousness yesterday, possibly syncope. Decreased bilateral upper extremity strength and inability to ambulate.  EXAM: MRI HEAD WITHOUT CONTRAST  TECHNIQUE: Multiplanar, multiecho pulse sequences of the brain and surrounding structures were obtained without intravenous contrast.  COMPARISON:  CT 06/28/2015  FINDINGS: Diffusion imaging does not show any acute or subacute infarction. There chronic small-vessel ischemic changes within the right side of the pons. No cerebellar insult. Cerebral hemispheres show mild to moderate chronic small-vessel ischemic changes within the deep white matter. No cortical or large vessel territory infarction. No mass lesion, hemorrhage, hydrocephalus or extra-axial collection. No pituitary mass. There is some fluid in the left division the sphenoid sinus but the sinuses are otherwise clear. No skull or skullbase lesion.  IMPRESSION: No acute finding. Mild to moderate chronic small-vessel ischemic changes affecting the pons in the cerebral hemispheric white matter.   Electronically Signed   By: Nelson Chimes M.D.   On: 06/30/2015 16:39   Mr Lumbar Spine Wo Contrast  06/29/2015   CLINICAL DATA:  Syncopal episode while cooking dinner, unable to move legs. Headache and confusion. Chronic back pain.  EXAM: MRI LUMBAR SPINE WITHOUT CONTRAST  TECHNIQUE: Multiplanar, multisequence MR imaging of the lumbar spine was performed. No intravenous contrast was administered.  COMPARISON:  CT lumbar spine June 28, 2015  FINDINGS: Lumbar vertebral bodies and posterior elements intact. Using the reference level of the last well-formed intervertebral disc as L5-S1, minimal L4-5 anterolisthesis without spondylolysis. Mild L2-3 and L5-S1 disc height loss. Decreased T2 signal within all lumbar discs consistent with mild desiccation. Mild chronic discogenic endplate changes at all lumbar levels. No STIR signal abnormality to suggest acute osseous process.  Conus medullaris terminates at L1 and appears normal morphology and signal characteristics. Cauda equina is centrally displaced due to canal stenosis and otherwise normal. Included prevertebral and paraspinal soft tissues are normal.  Level by level evaluation:  T12-L1, L1-2: No significant disc bulge. Mild facet arthropathy without canal stenosis or neural foraminal narrowing.  L2-3: Moderate broad-based disc bulge. Moderate facet arthropathy and ligamentum flavum redundancy. Moderate canal stenosis. Mild RIGHT neural foraminal narrowing.  L3-4: Annular bulging, moderate facet arthropathy and ligamentum flavum redundancy. Mild canal stenosis without neural foraminal narrowing.  L4-5: Anterolisthesis. Small broad-based disc bulge. Severe facet arthropathy and ligamentum flavum redundancy with trace facet effusions which are likely reactive. Moderate to severe canal stenosis, partially effaced LEFT lateral recess likely affects the traversing LEFT L5 nerve. Mild to moderate LEFT neural foraminal narrowing.  L5-S1: No disc bulge. Moderate to severe RIGHT, mild LEFT facet  arthropathy. 4 mm RIGHT facet synovial cyst extends medially into the dorsal epidural space. No canal stenosis or neural foraminal narrowing.  IMPRESSION: No acute lumbar spine fracture. Minimal grade 1 L4-5 anterolisthesis on degenerative basis.  Moderate to severe canal stenosis at L4-5, moderate canal stenosis at L2-3. Partial lateral recess of effacement at L4-5 could affect the traversing LEFT L5 nerve.  Neural foraminal narrowing L2-3 and L4-5: Mild to moderate on the LEFT at L4-5.  Severe L4-5 facet arthropathy and  reactive effusions.   Electronically Signed   By: Elon Alas M.D.   On: 06/29/2015 04:42   Ct Angio Ao+bifem W/cm &/or Wo/cm  06/04/2015   CLINICAL DATA:  Law filling of lower legs. Bilateral lower leg pain. Both feet are cold to the touch.  EXAM: CT ANGIOGRAPHY OF ABDOMINAL AORTA WITH ILIOFEMORAL RUNOFF  TECHNIQUE: Multidetector CT imaging of the abdomen, pelvis and lower extremities was performed using the standard protocol during bolus administration of intravenous contrast. Multiplanar CT image reconstructions and MIPs were obtained to evaluate the vascular anatomy.  CONTRAST:  140m OMNIPAQUE IOHEXOL 350 MG/ML SOLN  COMPARISON:  None.  FINDINGS: Aorta: Normal caliber. No aneurysm or dissection. Infrarenal abdominal aorta demonstrates mild mural thrombus and calcification consistent with atherosclerotic change. No significant stenosis. Focal stenosis demonstrated in the origin of the celiac axis. Vessels distal to stenosis are patent. The abdominal aorta, superior mesenteric artery, single bilateral renal arteries, inferior mesenteric artery, and bilateral iliac, external iliac, and internal iliac arteries are patent. Scattered calcific atherosclerotic changes are demonstrated. Renal nephrograms are symmetrical.  Right Lower Extremity: Atherosclerotic calcification in the right common femoral artery without evidence of critical stenosis. The right superficial femoral artery is patent  although the distal portion demonstrates segmental narrowing due to atherosclerotic change. Popliteal artery demonstrates atherosclerotic changes resulting in mild diffuse narrowing without occlusion. Tibioperoneal trunk is patent and there is two-vessel flow to the right ankle via anterior tibial and and posterior tibial arteries. Peroneal artery is patent but diminishes just above the ankle.  Left Lower Extremity: Atherosclerotic calcification in the common femoral artery and proximal superficial femoral artery without evidence of occlusion. Common femoral, superficial femoral, deep femoral, and popliteal arteries are patent. Focal areas of non critical stenosis are suggested in the mid and distal superficial femoral artery and in the popliteal artery. Tibial peroneal trunk is patent. Three-vessel runoff is demonstrated to the left ankle.  Review of the MIP images confirms the above findings.  Abdomen: Liver, spleen, gallbladder, pancreas, adrenal glands, kidneys, abdominal aorta, inferior vena cava, and retroperitoneal lymph nodes are unremarkable. Stomach, small bowel, and colon are not abnormally distended. No free air or free fluid in the abdomen.  Pelvis: Uterus and ovaries are not enlarged. No pelvic mass or lymphadenopathy. No free or loculated pelvic fluid collections. Bladder is decompressed. Degenerative changes in the spine. No destructive bone lesions appreciated.  IMPRESSION: Scattered calcific atherosclerotic changes demonstrated with focal areas of non critical narrowing demonstrated in the inferior abdominal aorta, bilateral common femoral arteries, and bilateral superficial femoral and popliteal arteries. No evidence of vascular occlusion. Three-vessel runoff to the left ankle. Three-vessel runoff nearly to the right ankle although the distal peroneal artery diminishes.   Electronically Signed   By: WLucienne CapersM.D.   On: 06/04/2015 21:38   [place]al  06/30/2015   CLINICAL DATA:  Back and  flank pain  EXAM: RENAL / URINARY TRACT ULTRASOUND COMPLETE  COMPARISON:  06/29/2015  FINDINGS: Right Kidney:  Length: 11.5. Echogenicity within normal limits. No mass or hydronephrosis visualized.  Left Kidney:  Length: 12.0. Echogenicity within normal limits. No mass or hydronephrosis visualized.  Bladder:  Appears normal for degree of bladder distention.  IMPRESSION: Normal renal ultrasound   Electronically Signed   By: MJerilynn Mages  Shick M.D.   On: 06/30/2015 14:27   Dg Knee Complete 4 Views Left  06/04/2015   CLINICAL DATA:  Acute left knee pain after fall in shower. Initial encounter.  EXAM: LEFT KNEE -  COMPLETE 4+ VIEW  COMPARISON:  January 11, 2015.  FINDINGS: There is no evidence of fracture, dislocation, or joint effusion. Mild tricompartmental joint space narrowing is noted with osteophyte formation. Soft tissues are unremarkable.  IMPRESSION: Mild tricompartmental degenerative joint disease. No acute abnormality seen in the left knee.   Electronically Signed   By: Marijo Conception, M.D.   On: 06/04/2015 15:31   Dg Knee Complete 4 Views Right  06/04/2015   CLINICAL DATA:  Fall onto the knee in the shower, pain  EXAM: RIGHT KNEE - COMPLETE 4+ VIEW  COMPARISON:  05/03/2015  FINDINGS: There is no evidence of fracture, dislocation, or joint effusion. There is no evidence of arthropathy or other focal bone abnormality. Soft tissues are unremarkable. Mild tricompartmental degenerative change identified. Minimal irregular trabeculation of the patella.  IMPRESSION: Mild tricompartmental degenerative change. There is minimal irregular trabeculation of the patella. If there is focal point tenderness raising the question of patellar fracture, consider CT for further evaluation.   Electronically Signed   By: Conchita Paris M.D.   On: 06/04/2015 15:32    2D Echo (06/1615):  Study Conclusions  - Left ventricle: Technically very limited study. Contrast images are good. LV function is normal. The cavity size was  normal. Wall thickness was increased in a pattern of mild LVH. The estimated ejection fraction was 60%. Regional wall motion abnormalities cannot be excluded. - Right ventricle: RV function probably normal. Poorly visualized.  Admission HPI: Patient is a 64 yo F with a PMHx of ostearthritis, bronchitis, HTN, chronic back and knee pain, COPD, weakness of both legs, and pseudodementia presenting to [facility] with a chief complaint of syncope. Patient's daughter was in the room who stated the patient was talking to her in the kitchen yesterday and all of a sudden became "stiff" and fell on the ground. States patient lost consciousness for a brief period until EMS arrived and then woke up confused. States patient did not injure her head. Daughter denies noticing any limb jerking movements, defecation, or urination. However, patient states she was having palpitations and chest pain and then fell on the ground. Pt states she gets chest pain at both rest and activity which happens on a regular basis, especially in the evening. States the pain lasts for 1 minute. Denies any nausea, diaphoresis, or SOB associated with the chest pain. States she has chronic SOB for which she uses Qvar. Patient states she has a history of frequent falls because "her legs give up." As per patient, she has a history of bilateral knee osteoarthritis and has "scar tissue" in her back which has caused her legs to become weak. Denies any focal weakness, numbness, or slurring of speech. Patient also states she thinks she has "crystals" in he ears which cause her to loose balance and fall.   Hospital Course by problem list: Active Problems:   Syncope   Essential hypertension   Spinal stenosis, lumbar region, with neurogenic claudication   Bilateral leg weakness   Erythrocytosis   Syncope and collapse   Syncope Pt stated she had chest pain and palpitations prior to the fall, as such, cardiogenic causes had to be considered. EKG did  not show any acute ST or T wave changes. Troponin negative. CXR normal. Echo showing EF 60% and mild LVH. However, study was technically limited. Patient later stated chest pain was subternal, a/w nausea and a bitter tatste in her mouth. Chest pain most likely related to GERD, pt was given a PPI.  Patient's daughter stated patient became "stiff" before falling on the ground. As such, seizure had to ruled out. CT head Wo contrast did not show any masses, hemorrhage, or infarction. MRI of brain Wo contrast did not show any acute findings. EEG normal and CK not elevated. Physical exam did not show any focal neurological deficits. Palpitations not likely 2/2 hyperthyroidism, TSH normal. Orthostatic vitals were normal. Psychosomatic causes are also on the differential. Discharged home with Prilosec 20 mg qd for GERD. She has been advised to follow up with her PCP and psychiatrist within 1 week.   Bilateral leg weakness B12 normal. CT of C-spine showing multilevel degenerative changes but no acute abnormality. X ray of lumbar spin showing multilevel degenerative disk disease with no acute abnormality. MRI of lumbar spine Wo contrast showing moderate to severe canal stenosis at L4-5, moderate canal stenosis at L2-3. Mild to moderate neural foraminal narrowing at L2-3 and L4-5. In addition, severe L4-5 facet arthropathy and reactive effusions. Pt states she has been to PT and it has not helped her. Will make suggestion to PCP for possible outpatient neurosurgery referral. Please follow up lab workup for myasthenia gravis.   Erythrocytosis, leukocytosis CBC showing Hgb 16.3, normal MCV. WBC 17.3, Lymphocytes 37%. Normal platelet count. Records indicate her Hgb and WBC count have been elevated since 2011. High WBC count could be due to inflammation, however, ESR normal. As such, findings are concerning for CLL vs myeloproliferative disorder. 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 Polycythemia not likely seocndary to renal mass because renal US was normal. Patient is to follow up with Dr. Beryle Beams at our clinic for further workup. Please follow up flow cytometry results.   COPD: cont home med Albuterol nebulizer prn   HTN: Patient was bradycardic. As such, changed doses of home blood pressure medications. Amlodipine increased from 2.5 to 5 mg qd. Metoprolol reduced from 50 to 25 mg qd.   DM: A1c 6.5.Held metformin during hospitalization.   Depression: cont home med Cymbalta 60 mg qhs  Peripheral neuropathy: Gabapentin 300 mg tid  GERD: Protonix 40 mg qd  Discharge Vitals:   BP 164/63 mmHg  Pulse 67  Temp(Src) 97.9 F (36.6 C) (Oral)  Resp 18  Ht 5' 4.17" (1.63 m)  Wt 190 lb 7.6 oz (86.4 kg)  BMI 32.52 kg/m2  SpO2 97%  Discharge Labs:  No results found for this or any previous visit (from the past 24 hour(s)).  Signed: Shela Leff, MD 07/01/2015, 11:10 PM    Services Ordered on Discharge: None Equipment Ordered on Discharge: None

## 2015-06-30 NOTE — Progress Notes (Signed)
Physical Therapy Treatment Patient Details Name: Carla [name] MRN: [id number] DOB: 1951/09/11 Today's Date: 06/30/2015    History of Present Illness Patient is a 64 yo female admitted 06/28/15 following syncope/fall, with decreased BLE strength and inability to ambulate.  PMH:  arthritis, HTN, chronic back pain, COPD, weakness BLE's (prior fall), memory difficulty, pseudodementia    PT Comments    Pt admitted with above diagnosis. Pt currently with functional limitations due to balance and endurance as well as some vertigo.  Pt can get HH therapies to address deficits.  Has RW at home.  Will follow acutely.   Pt will benefit from skilled PT to increase their independence and safety with mobility to allow discharge to the venue listed below.    Follow Up Recommendations  Home health PT;Supervision for mobility/OOB (for vestibular rehab and safety eval in home)     Equipment Recommendations  None recommended by PT    Recommendations for Other Services       Precautions / Restrictions Precautions Precautions: Fall Precaution Comments: Several falls at home Restrictions Weight Bearing Restrictions: No    Mobility  Bed Mobility Overal bed mobility: Needs Assistance Bed Mobility: Supine to Sit;Sit to Supine     Supine to sit: Min assist Sit to supine: Min assist   General bed mobility comments: increased time and assist to lift LEs onto bed   Transfers Overall transfer level: Needs assistance Equipment used: Rolling walker (2 wheeled) Transfers: Sit to/from Stand Sit to Stand: Min guard Stand pivot transfers: Min guard          Ambulation/Gait                 Stairs            Wheelchair Mobility    Modified Rankin (Stroke Patients Only)       Balance Overall balance assessment: Needs assistance Sitting-balance support: No upper extremity supported;Feet supported Sitting balance-Leahy Scale: Good     Standing balance support: Bilateral  upper extremity supported;During functional activity Standing balance-Leahy Scale: Poor Standing balance comment: requires UE support of RW.                     Cognition Arousal/Alertness: Awake/alert Behavior During Therapy: Flat affect Overall Cognitive Status: History of cognitive impairments - at baseline       Memory: Decreased short-term memory (Patient reports she has "trouble" with her memory)              Exercises      General Comments General comments (skin integrity, edema, etc.): On initial entry to room, OT walking pt and finishing up her treatment.  Pt able to walk with RW with supervision per observation.  [place] tech came to get pt therefore PT came back an hour later and assess vestibular system.  Supine head roll negative bil.  Tested positive for left BPPV and treated with canalith repositioining.  Pt felt nauseous post treatment but got her comfortable and explained that symptoms should get better in an hour if treatment effective.  Pt can f/u with HHPT for vestibular rehab.        Pertinent Vitals/Pain Pain Assessment: 0-10 Pain Score: 9  Faces Pain Scale: Hurts little more Pain Location: low back Pain Descriptors / Indicators: Aching;Constant Pain Intervention(s): Limited activity within patient's tolerance;Monitored during session;RN gave pain meds during session;Repositioned  VSS    Home Living Family/patient expects to be discharged to:: Private residence Living Arrangements: Children;Other relatives (2  daughters, son-in-law, grandchildren) Available Help at Discharge: Family;Available 24 hours/day Type of Home: House Home Access: Stairs to enter Entrance Stairs-Rails: Right;Left Home Layout: One level Home Equipment: Environmental consultant - 2 wheels;Wheelchair - manual;Cane - single point;Bedside commode;Other (comment) (Leg loop)      Prior Function Level of Independence: Needs assistance  Gait / Transfers Assistance Needed: Patient reports using a RW  in the house with family supervision.  And that her family move her around the house by sliding her on a chair.  Reports needing assist to move sit > stand, especially from toilet.  Reports she uses her w/c for mobility outside of the house. ADL's / Homemaking Assistance Needed: Pt initially states she does all self care mod I, except on her bad days, but upon further questioning, she states she requires assist for LB ADLs - family has to help her don/doff socks, pants, etc.      PT Goals (current goals can now be found in the care plan section) Acute Rehab PT Goals Patient Stated Goal: To get better. Progress towards PT goals: Progressing toward goals    Frequency  Min 3X/week    PT Plan Current plan remains appropriate    Co-evaluation             End of Session Equipment Utilized During Treatment: Gait belt Activity Tolerance: Patient limited by fatigue;Patient limited by pain Patient left: in bed;with call bell/phone within reach;with bed alarm set     Time: 1455-1524 PT Time Calculation (min) (ACUTE ONLY): 29 min  Charges:  $Self Care/Home Management: 8-22 $Canalith Rep Proc: 8-22 mins                    G Codes:      Tawni Millers F July 26, 2015, 4:07 PM [employer] Acute [facility] [phone] (pager)

## 2015-06-30 NOTE — Procedures (Signed)
ELECTROENCEPHALOGRAM REPORT  Patient: Carla [name]       Room #: 4B44 EEG No. ID: [id number] Age: 64 y.o.        Sex: female Referring Physician: Sharlene Motts Report Date:  06/30/2015        Interpreting Physician: Aline Brochure  History: Carla [name] is an 64 y.o. female history of hypertension, pseudodementia, bronchitis and chronic back pain admitted on 06/29/2015 following an episode of loss of consciousness, description of which is consistent with syncope.  Indications for study:  Rule out seizure disorder.  Technique: This is an 18 channel routine scalp EEG performed at the bedside with bipolar and monopolar montages arranged in accordance to the international 10/20 system of electrode placement.   Description: This EEG recording was performed during wakefulness. The predominant background activity consisted of 10-11 Hz symmetrical alpha rhythm. There was also low amplitude beta activity recorded from the frontal and central regions. Photic stimulation was not performed. No epileptiform discharges were recorded. There was no abnormal slowing of cerebral activity.  Interpretation: This is a normal EEG recording during wakefulness. No evidence of an epileptic disorder was.   Venetia Maxon M.D. [facility] [phone]

## 2015-07-01 NOTE — Care Management Note (Signed)
Case Management Note  Patient Details  Name: Carla [name] MRN: [id number] Date of Birth: 01-14-1951  Subjective/Objective:                   syncope Action/Plan:  Discharge planning Expected Discharge Date:  06/30/15               Expected Discharge Plan:  Home w Home Health Services  In-House Referral:     Discharge planning Services  CM Consult  Post Acute Care Choice:  Home Health Choice offered to:  Patient  DME Arranged:    DME Agency:     HH Arranged:  PT, OT HH Agency:  [facility]  Status of Service:  Completed, signed off  Medicare Important Message Given:    Date Medicare IM Given:    Medicare IM give by:    Date Additional Medicare IM Given:    Additional Medicare Important Message give by:     If discussed at Long Length of Stay Meetings, dates discussed:    Additional Comments: CM received call from RN who states pt was discharged yesterday evening after hours and need HHPT/OT.  CM called pt at home to offer choice of home health agency.  Pt chooses [facility] to render HHPT/OT.  Address and contact information verified by pt.  Referral called to [facility] rep, Tiffany.  No other CM needs were communicated. Yves Dill, RN 07/01/2015, 1:10 PM

## 2015-07-02 LAB — PATHOLOGIST SMEAR REVIEW

## 2015-07-02 LAB — STRIATED MUSCLE ANTIBODY: ANTI-STRIATION ABS: NEGATIVE

## 2015-07-03 LAB — LYMPHOCYTE SUBSETS, FLOW CYTOMETRY (INPT)

## 2015-07-03 LAB — ACETYLCHOLINE RECEPTOR, BINDING: Acety choline binding ab: <0.03 nmol/L (ref 0.00–0.24)

## 2015-07-10 ENCOUNTER — Emergency Department (HOSPITAL_COMMUNITY)
Admission: EM | Admit: 2015-07-10 | Discharge: 2015-07-10 | Disposition: A | Discharge status: Home / Self Care | Payer: Medicare Other | Attending: Emergency Medicine | Admitting: Emergency Medicine

## 2015-07-10 ENCOUNTER — Encounter (HOSPITAL_COMMUNITY): Payer: Self-pay | Admitting: Emergency Medicine

## 2015-07-10 DIAGNOSIS — J449 Chronic obstructive pulmonary disease, unspecified: Secondary | ICD-10-CM | POA: Insufficient documentation

## 2015-07-10 DIAGNOSIS — F6811 Factitious disorder with predominantly psychological signs and symptoms: Secondary | ICD-10-CM | POA: Diagnosis not present

## 2015-07-10 DIAGNOSIS — Z720 Tobacco use: Secondary | ICD-10-CM | POA: Insufficient documentation

## 2015-07-10 DIAGNOSIS — M1990 Unspecified osteoarthritis, unspecified site: Secondary | ICD-10-CM | POA: Insufficient documentation

## 2015-07-10 DIAGNOSIS — I10 Essential (primary) hypertension: Secondary | ICD-10-CM | POA: Diagnosis not present

## 2015-07-10 DIAGNOSIS — Z79899 Other long term (current) drug therapy: Secondary | ICD-10-CM | POA: Insufficient documentation

## 2015-07-10 DIAGNOSIS — R200 Anesthesia of skin: Secondary | ICD-10-CM

## 2015-07-10 DIAGNOSIS — G8929 Other chronic pain: Secondary | ICD-10-CM | POA: Insufficient documentation

## 2015-07-10 MED ORDER — PREDNISONE 50 MG PO TABS: ORAL_TABLET | ORAL | Status: DC

## 2015-07-10 MED ORDER — ONDANSETRON 8 MG PO TBDP
8.0000 mg | ORAL_TABLET | Freq: Once | ORAL | Status: AC
  Administered 2015-07-10: 8 mg via ORAL
  Filled 2015-07-10: qty 1

## 2015-07-10 MED ORDER — OXYCODONE-ACETAMINOPHEN 5-325 MG PO TABS: 1.0000 | ORAL_TABLET | Freq: Four times a day (QID) | ORAL | Status: DC | PRN

## 2015-07-10 MED ORDER — HYDROMORPHONE HCL 1 MG/ML IJ SOLN
1.0000 mg | Freq: Once | INTRAMUSCULAR | Status: AC
  Administered 2015-07-10: 1 mg via INTRAMUSCULAR
  Filled 2015-07-10: qty 1

## 2015-07-10 NOTE — Discharge Instructions (Signed)
You must follow-up with your neurosurgeon. Call the office for an appointment. Prescription for pain medicine and prednisone.

## 2015-07-10 NOTE — ED Notes (Signed)
Vital signs per EMS   BP: 136/73  O2 sat: 98% on Room Air  Heart rate: 84  RR: 18  CBG: 130

## 2015-07-10 NOTE — ED Provider Notes (Signed)
CSN: [id number]     Arrival date & time 07/10/15  1333 History   First MD Initiated Contact with Patient 07/10/15 1344     Chief Complaint  Patient presents with  . Numbness     (Consider location/radiation/quality/duration/timing/severity/associated sxs/prior Treatment) HPI..... Patient was bent over working on a quilt when she was unable to straighten up completely. She now complains of numbness in her bilateral legs. No bowel or bladder incontinence. This is not a new finding. She is status post MRI of her lumbar spine on 06/29/15 which revealed severe spinal stenosis at L4-5. She has had these symptoms several times in the past. She is a wheelchair to get around. Her symptoms typically spontaneously recover over time.  Past Medical History  Diagnosis Date  . Arthritis   . Bronchitis   . Hypertension   . Chronic back pain   . Chronic knee pain   . COPD (chronic obstructive pulmonary disease)   . Memory difficulty 2015  . Weakness of both legs   . 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 Knee surgery  2007  . Appendectomy  1971  . Tonsillectomy  1971   Family History  Problem Relation Age of Onset  . Heart failure Father   . Diabetes Father   . Kidney failure Father    Social History  Substance Use Topics  . Smoking status: Light Tobacco Smoker -- 0.50 packs/day for 10 years    Types: Cigarettes  . Smokeless tobacco: None     Comment: 04/30/15 less than 1 PPD  . Alcohol Use: 0.0 oz/week    0 Standard drinks or equivalent per week     Comment: Very rare   OB History    Gravida Para Term Preterm AB TAB SAB Ectopic Multiple Living   2 2 2             Review of Systems  All other systems reviewed and are negative.     Allergies  Bee venom; Mushroom extract complex; Amitriptyline; Toradol; Tramadol; and Trazodone and nefazodone  Home Medications    Prior to Admission medications   Medication Sig Start Date End Date Taking? Authorizing Provider  albuterol (PROVENTIL HFA;VENTOLIN HFA) 108 (90 BASE) MCG/ACT inhaler Inhale 2 puffs into the lungs every 6 (six) hours as needed for wheezing. 06/21/13  Yes Nimish Normajean Glasgow, MD  amLODipine (NORVASC) 2.5 MG tablet Take 2 tablets (5 mg total) by mouth daily. 06/30/15  Yes Otis Brace, MD  cyclobenzaprine (FLEXERIL) 10 MG tablet Take 10 mg by mouth 3 (three) times daily as needed for muscle spasms.   Yes Historical Provider, MD  diazepam (VALIUM) 5 MG tablet Take 5 mg by mouth 3 (three) times daily as needed for anxiety.   Yes Historical Provider, MD  diphenhydrAMINE (BENADRYL) 25 MG tablet Take 2 tablets (50 mg total) by mouth every 4 (four) hours as needed for itching. 12/17/13  Yes Wayland Salinas, MD  DULoxetine (CYMBALTA) 60 MG capsule Take 60 mg by mouth at bedtime.    Yes Historical Provider, MD  EVZIO 0.4 MG/0.4ML SOAJ Inject 0.4 mLs into the muscle once as needed (for Anaphylaxis/allergic reaction).  02/19/15  Yes Historical Provider, MD  gabapentin (NEURONTIN) 300 MG capsule Take 300 mg by mouth 3 (three) times daily.   Yes Historical Provider, MD  HYDROcodone-acetaminophen (NORCO) 10-325 MG per tablet Take 1 tablet by mouth every 6 (  six) hours as needed for moderate pain or severe pain.   Yes Historical Provider, MD  hydrOXYzine (VISTARIL) 25 MG capsule Take 25 mg by mouth at bedtime.   Yes Historical Provider, MD  metFORMIN (GLUCOPHAGE-XR) 500 MG 24 hr tablet Take 500 mg by mouth at bedtime.  12/15/14  Yes Historical Provider, MD  metoprolol succinate (TOPROL-XL) 25 MG 24 hr tablet Take 1 tablet (25 mg total) by mouth at bedtime. 06/30/15  Yes Otis Brace, MD  omeprazole (PRILOSEC) 20 MG capsule Take 1 capsule (20 mg total) by mouth daily. 06/30/15  Yes Marjan Rabbani, MD  VOLTAREN 1 % GEL Apply 4 g topically every 8 (eight) hours.  02/06/15  Yes Historical Provider, MD  oxyCODONE-acetaminophen  (PERCOCET/ROXICET) 5-325 MG tablet Take 1-2 tablets by mouth every 6 (six) hours as needed. 07/10/15   Donnetta Hutching, MD  predniSONE (DELTASONE) 50 MG tablet 1 tablet for 6 days, one half tablet for 6 days 07/10/15   Donnetta Hutching, MD   BP 149/77 mmHg  Pulse 85  Temp(Src) 98.1 F (36.7 C)  Resp 16  SpO2 97% Physical Exam  Constitutional: She is oriented to person, place, and time. She appears well-developed and well-nourished.  HENT:  Head: Normocephalic and atraumatic.  Eyes: Conjunctivae and EOM are normal. Pupils are equal, round, and reactive to light.  Neck: Normal range of motion. Neck supple.  Cardiovascular: Normal rate and regular rhythm.   Pulmonary/Chest: Effort normal and breath sounds normal.  Abdominal: Soft. Bowel sounds are normal.  Musculoskeletal: Normal range of motion.  Neurological: She is alert and oriented to person, place, and time.  Patient is not freely moving her bilateral lower extremities. Sensation intact  Skin: Skin is warm and dry.  Psychiatric: She has a normal mood and affect. Her behavior is normal.  Nursing note and vitals reviewed.   ED Course  Procedures (including critical care time) Labs Review Labs Reviewed - No data to display  Imaging Review No results found. I have personally reviewed and evaluated these images and lab results as part of my medical decision-making.   EKG Interpretation None      MDM   Final diagnoses:  Numbness in both legs    Patient has been evaluated by neurosurgery in the past for these same symptoms. Discussed recent MRI with neurosurgeon on-call Dr Jordan Likes.  He will see patient in follow-up. No bowel or bladder incontinence today. Discharge medications Percocet and prednisone.    Donnetta Hutching, MD 07/10/15 [phone]

## 2015-07-10 NOTE — ED Notes (Signed)
Pt states that she was at home making a quilt when she felt something "catch in her back", patient now reports that she is having numbness in both legs and is unable to move them. Patient does report falling straight down but denies any injury from fall.

## 2015-07-11 ENCOUNTER — Other Ambulatory Visit: Payer: Self-pay | Admitting: Neurosurgery

## 2015-07-11 DIAGNOSIS — M48061 Spinal stenosis, lumbar region without neurogenic claudication: Secondary | ICD-10-CM

## 2015-07-16 ENCOUNTER — Encounter: Payer: Self-pay | Admitting: Oncology

## 2015-07-16 ENCOUNTER — Ambulatory Visit (INDEPENDENT_AMBULATORY_CARE_PROVIDER_SITE_OTHER): Payer: Medicare Other | Admitting: Oncology

## 2015-07-16 VITALS — BP 194/74 | HR 68 | Temp 98.1°F | Ht 64.0 in | Wt 244.1 lb

## 2015-07-16 DIAGNOSIS — D72829 Elevated white blood cell count, unspecified: Secondary | ICD-10-CM | POA: Diagnosis not present

## 2015-07-16 DIAGNOSIS — F17210 Nicotine dependence, cigarettes, uncomplicated: Secondary | ICD-10-CM

## 2015-07-16 DIAGNOSIS — D751 Secondary polycythemia: Secondary | ICD-10-CM

## 2015-07-16 LAB — SAVE SMEAR

## 2015-07-16 NOTE — Progress Notes (Signed)
Patient ID: Carla [name], female   DOB: 06-16-1951, 64 y.o.   MRN: [id number] New Patient Hematology   JENAVIE STANCZAK [id number] Jan 24, 1951 64 y.o. 07/16/2015  CC: Dr. [name], Dr. John Giovanni   Reason for referral: Evaluate elevated red count and white count   HPI:  64 year old obese woman, BMI 38.6, one half pack per day cigarette smoker at least since 2005. She had stopped for a number of years before that but had smoked previously. She recently came to medical attention when she was admitted to the hospital on 06/29/2015 for a witnessed syncopal episode. She lost consciousness. No seizure activity noted. She could get not get up. She felt that she couldn't move her legs. She has problems with chronic severe back pain and further evaluation in the hospital with an MRI showed severe spinal stenosis in addition to advanced degenerative arthritis changes. She has arthritis in both of her knees and hips further limiting her mobility and causing pain. She had a prior right knee arthroscopy after a torn meniscus in 2007 while living in [place] and feels that the right knee has been worse since that procedure. She has episodes of recurrent sinusitis, seasonal allergic rhinitis, bronchitis. She has been told that she has obstructive airway disease although advanced changes on chest radiograph 06/29/2015 are not apparent. She has been on bronchodilatiorsintermittent steroids. She is currently on a tapering course of steroids started during recent hospitalization. She had a May 2013 admission and a September 2014 admission related to acute exacerbation of respiratory symptoms. She does not have any prosthetic devices. Oxygen saturation is 99% today on room air. She is hypertensive but not currently on a diuretic. No known family history of any blood disorder.  At time of admission to the hospital on September 15: White count 14,800 with 43% neutrophils, 46 lymphocytes, 8 monocytes, 3  eosinophils, hemoglobin 16, hematocrit 48, MCV 92, platelet count 291,000. On August 31, white count 11,400, 42% neutrophils, 49 lymphocytes, hemoglobin 17, hematocrit 52. September 13, 2014, white count 10,000 . On 01/11/2010 white count 12,800, 48 neutrophils, 39 lymphocytes, 10 monocytes, hemoglobin 15.4, platelet count 288,000. Hemoglobin did not rise to 17 g until 09/13/2014.  PMH: Past Medical History  Diagnosis Date  . Arthritis   . Bronchitis   . Hypertension   . Chronic back pain   . Chronic knee pain   . COPD (chronic obstructive pulmonary disease) (HCC)   . Memory difficulty 2015  . Weakness of both legs   . 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 Knee surgery  2007  . Appendectomy  1971  . Tonsillectomy  1971    Allergies: Allergies  Allergen Reactions  . Bee Venom Anaphylaxis  . Mushroom Extract Complex Anaphylaxis    Extreme sweating, vomiting  . Amitriptyline Other (See Comments)    Has nightmares when using, not in right state of mind   . Toradol [Ketorolac Tromethamine] Nausea And Vomiting  . Tramadol Nausea And Vomiting  . Trazodone And Nefazodone Nausea And Vomiting    Medications:  Current outpatient prescriptions:  .  albuterol (PROVENTIL HFA;VENTOLIN HFA) 108 (90 BASE) MCG/ACT inhaler, Inhale 2 puffs into the lungs every 6 (six) hours as needed for wheezing., Disp: 1 Inhaler, Rfl: 2 .  amLODipine (NORVASC) 2.5 MG tablet, Take 2 tablets (5 mg total) by mouth daily., Disp: , Rfl:  .  cyclobenzaprine (  FLEXERIL) 10 MG tablet, Take 10 mg by mouth 3 (three) times daily as needed for muscle spasms., Disp: , Rfl:  .  diazepam (VALIUM) 5 MG tablet, Take 5 mg by mouth 3 (three) times daily as needed for anxiety., Disp: , Rfl:  .  diphenhydrAMINE (BENADRYL) 25 MG tablet, Take 2 tablets (50 mg total) by mouth every 4 (four) hours as needed for  itching., Disp: 20 tablet, Rfl: 0 .  DULoxetine (CYMBALTA) 60 MG capsule, Take 60 mg by mouth at bedtime. , Disp: , Rfl:  .  EVZIO 0.4 MG/0.4ML SOAJ, Inject 0.4 mLs into the muscle once as needed (for Anaphylaxis/allergic reaction). , Disp: , Rfl:  .  gabapentin (NEURONTIN) 300 MG capsule, Take 300 mg by mouth 3 (three) times daily., Disp: , Rfl:  .  HYDROcodone-acetaminophen (NORCO) 10-325 MG per tablet, Take 1 tablet by mouth every 6 (six) hours as needed for moderate pain or severe pain., Disp: , Rfl:  .  hydrOXYzine (VISTARIL) 25 MG capsule, Take 25 mg by mouth at bedtime., Disp: , Rfl:  .  metFORMIN (GLUCOPHAGE-XR) 500 MG 24 hr tablet, Take 500 mg by mouth at bedtime. , Disp: , Rfl:  .  metoprolol succinate (TOPROL-XL) 25 MG 24 hr tablet, Take 1 tablet (25 mg total) by mouth at bedtime., Disp: 30 tablet, Rfl: 2 .  omeprazole (PRILOSEC) 20 MG capsule, Take 1 capsule (20 mg total) by mouth daily., Disp: 30 capsule, Rfl: 0 .  oxyCODONE-acetaminophen (PERCOCET/ROXICET) 5-325 MG tablet, Take 1-2 tablets by mouth every 6 (six) hours as needed., Disp: 16 tablet, Rfl: 0 .  predniSONE (DELTASONE) 50 MG tablet, 1 tablet for 6 days, one half tablet for 6 days, Disp: 9 tablet, Rfl: 0 .  VOLTAREN 1 % GEL, Apply 4 g topically every 8 (eight) hours. , Disp: , Rfl:     Social History: She lives with one of her daughters who accompanies her today. 7+ pack year cigarette smoker  She reports that she drinks alcohol. t she does not use illicit drugs.  Family History: Family History  Problem Relation Age of Onset  . Heart failure Father   . Diabetes Father   . Kidney failure Father    mother left her when she was only 55 years old and she was raised by her father.  Review of Systems: See history of present illness Remaining ROS negative.  Physical Exam: Blood pressure 194/74, pulse 68, temperature 98.1 F (36.7 C), temperature source Oral, height  (1.626 m), weight 244 lb 1.6 oz (110.723 kg),  SpO2 98 %. Wt Readings from Last 3 Encounters:  07/16/15 244 lb 1.6 oz (110.723 kg)  06/29/15 190 lb 7.6 oz (86.4 kg)  06/23/15 225 lb (102.059 kg)     General appearance: Obese Caucasian woman in a wheelchair HENNT: Pharynx no erythema, exudate, mass, or ulcer. Rotten teeth 2 or 3 of which have broken off in the upper gumline No thyromegaly or thyroid nodules Lymph nodes: No cervical, supraclavicular, or axillary lymphadenopathy Breasts: Lungs: Clear to auscultation, resonant to percussion throughout Heart: Regular rhythm, no murmur, no gallop, no rub, no click, no edema Abdomen: Soft, obese, nontender, normal bowel sounds, no gross mass, no obvious organomegaly Extremities: No edema, no calf tenderness Musculoskeletal:  joint deformities of the fingers on both hands, and both knees. GU:  Vascular: Carotid pulses 2+, no bruits,  Neurologic: Alert, oriented, PERRLA, , cranial nerves grossly normal, motor strength 5 over 5, upper extremities. Lower extremity exam limited  by severe pain with simple dorsiflexion and plantar flexion of the ankle which causes pain in her calves radiating to her back. reflexes 1+ symmetric at the biceps, absent symmetric at the knees. upper body coordination normal, gait not tested. Skin: No rash or ecchymosis    Lab Results: Lab Results  Component Value Date   WBC 17.3* 06/30/2015   HGB 16.3* 06/30/2015   HCT 48.5* 06/30/2015   MCV 90.7 06/30/2015   PLT 274 06/30/2015     Chemistry      Component Value Date/Time   NA 137 06/30/2015 0737   K 4.4 06/30/2015 0737   CL 101 06/30/2015 0737   CO2 27 06/30/2015 0737   BUN 11 06/30/2015 0737   CREATININE 0.65 06/30/2015 0737      Component Value Date/Time   CALCIUM 9.1 06/30/2015 0737   ALKPHOS 105 06/28/2015 1908   AST 26 06/28/2015 1908   ALT 25 06/28/2015 1908   BILITOT 0.2* 06/28/2015 1908       Pathology: Flow cytometry peripheral blood 06/29/15 with no monoclonal lymphocyte populations. No  evidence for CLL.    Radiological Studies: Chest x-ray 06/29/2015 was normal.    Impression and Plan: #1. Elevated white count White count differential has been variable with trend for shift to the right with increased lymphocytes. A sample of blood was sent for flow cytometry and does not show a monoclonal B-cell population effectively ruling out chronic lymphocytic leukemia. Relative lymphocytosis can be seen as a result of previous viral infection. She has a number of reasons for chronic elevation of her white count including her ongoing cigarette smoking, recurrent bouts of what appears to be allergic rhinitis, sinusitis, and bronchitis. Her carious teeth, and her intermittent use of steroids. No suspicion for a myeloproliferative disorder at this time. Recommendation: Other than having her rotten teeth taken care of, I would recommend observation alone at this time  #2. Elevated hemoglobin and hematocrit. Despite normal oxygen saturation at rest, I would not be surprised if she desaturates at night and has an obstructive sleep apnea syndrome given her weight and chronic respiratory symptoms. An erythropoietin level was elevated at 81.2 units done on September 16. Although paraneoplastic elevation is in the differential, with kidney and liver cancer being most common, there is a low clinical suspicion that she has a malignancy at this time. Liver functions are normal. Renal ultrasound done on 06/30/2015 shows no renal or adrenal mass. Chest radiograph normal. She is gaining weight. Once again, I have a low clinical suspicion that she has a myeloproliferative disorder. Recommendation: Consider a sleep study to rule out obesity sleep apnea syndrome.     Levert Feinstein, MD 07/16/2015, 6:18 PM

## 2015-07-16 NOTE — Patient Instructions (Signed)
To lab today Return visit as needed. We will send a report to Dr Loney Hering

## 2015-07-17 ENCOUNTER — Ambulatory Visit
Admission: RE | Admit: 2015-07-17 | Discharge: 2015-07-17 | Disposition: A | Discharge status: Home / Self Care | Payer: Medicare Other | Source: Ambulatory Visit | Attending: Neurosurgery | Admitting: Neurosurgery

## 2015-07-17 DIAGNOSIS — M48061 Spinal stenosis, lumbar region without neurogenic claudication: Secondary | ICD-10-CM

## 2015-07-17 LAB — CBC WITH DIFFERENTIAL/PLATELET
Basophils Absolute: 0 x10E3/uL (ref 0.0–0.2)
Basos: 0 %
EOS (ABSOLUTE): 0 x10E3/uL (ref 0.0–0.4)
Eos: 0 %
Hematocrit: 46.2 % (ref 34.0–46.6)
Hemoglobin: 15.4 g/dL (ref 11.1–15.9)
Immature Grans (Abs): 0.2 x10E3/uL — ABNORMAL HIGH (ref 0.0–0.1)
Immature Granulocytes: 1 %
Lymphocytes Absolute: 5.6 x10E3/uL — ABNORMAL HIGH (ref 0.7–3.1)
Lymphs: 28 %
MCH: 29.8 pg (ref 26.6–33.0)
MCHC: 33.3 g/dL (ref 31.5–35.7)
MCV: 90 fL (ref 79–97)
Monocytes Absolute: 0.8 x10E3/uL (ref 0.1–0.9)
Monocytes: 4 %
Neutrophils Absolute: 13.5 x10E3/uL — ABNORMAL HIGH (ref 1.4–7.0)
Neutrophils: 67 %
Platelets: 363 x10E3/uL (ref 150–379)
RBC: 5.16 x10E6/uL (ref 3.77–5.28)
RDW: 13.6 % (ref 12.3–15.4)
WBC: 20.1 x10E3/uL (ref 3.4–10.8)

## 2015-07-17 LAB — SEDIMENTATION RATE: SED RATE: 26 mm/h (ref 0–40)

## 2015-07-17 MED ORDER — IOHEXOL 180 MG/ML  SOLN
1.0000 mL | Freq: Once | INTRAMUSCULAR | Status: DC | PRN
  Administered 2015-07-17: 1 mL via EPIDURAL

## 2015-07-17 MED ORDER — METHYLPREDNISOLONE ACETATE 40 MG/ML INJ SUSP (RADIOLOG
120.0000 mg | Freq: Once | INTRAMUSCULAR | Status: AC
  Administered 2015-07-17: 120 mg via EPIDURAL

## 2015-07-17 NOTE — Discharge Instructions (Signed)

## 2015-08-10 ENCOUNTER — Encounter (HOSPITAL_COMMUNITY): Payer: Self-pay | Admitting: *Deleted

## 2015-08-10 ENCOUNTER — Emergency Department (HOSPITAL_COMMUNITY)
Admission: EM | Admit: 2015-08-10 | Discharge: 2015-08-10 | Disposition: A | Discharge status: Home / Self Care | Payer: Medicare Other | Attending: Emergency Medicine | Admitting: Emergency Medicine

## 2015-08-10 DIAGNOSIS — R6883 Chills (without fever): Secondary | ICD-10-CM | POA: Diagnosis not present

## 2015-08-10 DIAGNOSIS — Z9889 Other specified postprocedural states: Secondary | ICD-10-CM | POA: Diagnosis not present

## 2015-08-10 DIAGNOSIS — G8929 Other chronic pain: Secondary | ICD-10-CM | POA: Diagnosis not present

## 2015-08-10 DIAGNOSIS — M25551 Pain in right hip: Secondary | ICD-10-CM | POA: Diagnosis not present

## 2015-08-10 DIAGNOSIS — M25552 Pain in left hip: Secondary | ICD-10-CM | POA: Insufficient documentation

## 2015-08-10 DIAGNOSIS — I10 Essential (primary) hypertension: Secondary | ICD-10-CM | POA: Diagnosis not present

## 2015-08-10 DIAGNOSIS — Z720 Tobacco use: Secondary | ICD-10-CM | POA: Diagnosis not present

## 2015-08-10 DIAGNOSIS — Z79899 Other long term (current) drug therapy: Secondary | ICD-10-CM | POA: Diagnosis not present

## 2015-08-10 DIAGNOSIS — M1990 Unspecified osteoarthritis, unspecified site: Secondary | ICD-10-CM | POA: Diagnosis not present

## 2015-08-10 DIAGNOSIS — M25562 Pain in left knee: Secondary | ICD-10-CM | POA: Insufficient documentation

## 2015-08-10 DIAGNOSIS — M25561 Pain in right knee: Secondary | ICD-10-CM | POA: Insufficient documentation

## 2015-08-10 DIAGNOSIS — J449 Chronic obstructive pulmonary disease, unspecified: Secondary | ICD-10-CM | POA: Insufficient documentation

## 2015-08-10 DIAGNOSIS — M545 Low back pain: Secondary | ICD-10-CM | POA: Diagnosis present

## 2015-08-10 MED ORDER — HYDROMORPHONE HCL 1 MG/ML IJ SOLN
1.0000 mg | Freq: Once | INTRAMUSCULAR | Status: AC
  Administered 2015-08-10: 1 mg via INTRAMUSCULAR
  Filled 2015-08-10: qty 1

## 2015-08-10 NOTE — Discharge Instructions (Signed)

## 2015-08-10 NOTE — ED Notes (Signed)
MD at bedside. 
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
## 2015-08-13 ENCOUNTER — Emergency Department (HOSPITAL_COMMUNITY)
Admission: EM | Admit: 2015-08-13 | Discharge: 2015-08-13 | Disposition: A | Discharge status: Home / Self Care | Payer: Medicare Other | Attending: Emergency Medicine | Admitting: Emergency Medicine

## 2015-08-13 ENCOUNTER — Encounter (HOSPITAL_COMMUNITY): Payer: Self-pay

## 2015-08-13 DIAGNOSIS — M1990 Unspecified osteoarthritis, unspecified site: Secondary | ICD-10-CM | POA: Diagnosis not present

## 2015-08-13 DIAGNOSIS — I10 Essential (primary) hypertension: Secondary | ICD-10-CM | POA: Insufficient documentation

## 2015-08-13 DIAGNOSIS — Z79899 Other long term (current) drug therapy: Secondary | ICD-10-CM | POA: Insufficient documentation

## 2015-08-13 DIAGNOSIS — Z720 Tobacco use: Secondary | ICD-10-CM | POA: Diagnosis not present

## 2015-08-13 DIAGNOSIS — J449 Chronic obstructive pulmonary disease, unspecified: Secondary | ICD-10-CM | POA: Diagnosis not present

## 2015-08-13 DIAGNOSIS — G8929 Other chronic pain: Secondary | ICD-10-CM | POA: Insufficient documentation

## 2015-08-13 DIAGNOSIS — Z760 Encounter for issue of repeat prescription: Secondary | ICD-10-CM | POA: Diagnosis present
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
## 2015-08-13 NOTE — Discharge Instructions (Signed)
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
## 2015-09-17 ENCOUNTER — Other Ambulatory Visit: Payer: Self-pay | Admitting: Internal Medicine

## 2015-10-23 ENCOUNTER — Emergency Department (HOSPITAL_COMMUNITY): Payer: Medicare Other

## 2015-10-23 ENCOUNTER — Encounter (HOSPITAL_COMMUNITY): Payer: Self-pay | Admitting: Emergency Medicine

## 2015-10-23 ENCOUNTER — Emergency Department (HOSPITAL_COMMUNITY)
Admission: EM | Admit: 2015-10-23 | Discharge: 2015-10-23 | Disposition: A | Discharge status: Home / Self Care | Payer: Medicare Other | Attending: Emergency Medicine | Admitting: Emergency Medicine

## 2015-10-23 DIAGNOSIS — Z7984 Long term (current) use of oral hypoglycemic drugs: Secondary | ICD-10-CM | POA: Diagnosis not present

## 2015-10-23 DIAGNOSIS — M1990 Unspecified osteoarthritis, unspecified site: Secondary | ICD-10-CM | POA: Insufficient documentation

## 2015-10-23 DIAGNOSIS — Y92030 Kitchen in apartment as the place of occurrence of the external cause: Secondary | ICD-10-CM | POA: Insufficient documentation

## 2015-10-23 DIAGNOSIS — M549 Dorsalgia, unspecified: Secondary | ICD-10-CM

## 2015-10-23 DIAGNOSIS — M48061 Spinal stenosis, lumbar region without neurogenic claudication: Secondary | ICD-10-CM

## 2015-10-23 DIAGNOSIS — W1839XA Other fall on same level, initial encounter: Secondary | ICD-10-CM | POA: Insufficient documentation

## 2015-10-23 DIAGNOSIS — Z79899 Other long term (current) drug therapy: Secondary | ICD-10-CM | POA: Diagnosis not present

## 2015-10-23 DIAGNOSIS — G8929 Other chronic pain: Secondary | ICD-10-CM | POA: Diagnosis not present

## 2015-10-23 DIAGNOSIS — F17210 Nicotine dependence, cigarettes, uncomplicated: Secondary | ICD-10-CM | POA: Diagnosis not present

## 2015-10-23 DIAGNOSIS — M4806 Spinal stenosis, lumbar region: Secondary | ICD-10-CM | POA: Insufficient documentation

## 2015-10-23 DIAGNOSIS — Z8659 Personal history of other mental and behavioral disorders: Secondary | ICD-10-CM | POA: Insufficient documentation

## 2015-10-23 DIAGNOSIS — Y998 Other external cause status: Secondary | ICD-10-CM | POA: Insufficient documentation

## 2015-10-23 DIAGNOSIS — S3992XA Unspecified injury of lower back, initial encounter: Secondary | ICD-10-CM | POA: Diagnosis present

## 2015-10-23 DIAGNOSIS — I10 Essential (primary) hypertension: Secondary | ICD-10-CM | POA: Insufficient documentation

## 2015-10-23 DIAGNOSIS — Y93G1 Activity, food preparation and clean up: Secondary | ICD-10-CM | POA: Insufficient documentation

## 2015-10-23 DIAGNOSIS — J449 Chronic obstructive pulmonary disease, unspecified: Secondary | ICD-10-CM | POA: Insufficient documentation

## 2015-10-23 HISTORY — DX: Pain, unspecified: R52

## 2015-10-23 LAB — BASIC METABOLIC PANEL
ANION GAP: 10 (ref 5–15)
BUN: 12 mg/dL (ref 6–20)
CALCIUM: 9.4 mg/dL (ref 8.9–10.3)
CO2: 29 mmol/L (ref 22–32)
Chloride: 99 mmol/L — ABNORMAL LOW (ref 101–111)
Creatinine, Ser: 0.6 mg/dL (ref 0.44–1.00)
Glucose, Bld: 118 mg/dL — ABNORMAL HIGH (ref 65–99)
POTASSIUM: 4.2 mmol/L (ref 3.5–5.1)
Sodium: 138 mmol/L (ref 135–145)

## 2015-10-23 LAB — CBC
HEMATOCRIT: 53.5 % — AB (ref 36.0–46.0)
Hemoglobin: 17.8 g/dL — ABNORMAL HIGH (ref 12.0–15.0)
MCH: 30.4 pg (ref 26.0–34.0)
MCHC: 33.3 g/dL (ref 30.0–36.0)
MCV: 91.3 fL (ref 78.0–100.0)
PLATELETS: 347 10*3/uL (ref 150–400)
RBC: 5.86 MIL/uL — AB (ref 3.87–5.11)
RDW: 13.2 % (ref 11.5–15.5)
WBC: 10 10*3/uL (ref 4.0–10.5)

## 2015-10-23 MED ORDER — HYDROMORPHONE HCL 1 MG/ML IJ SOLN
1.0000 mg | Freq: Once | INTRAMUSCULAR | Status: AC
  Administered 2015-10-23: 1 mg via INTRAVENOUS
  Filled 2015-10-23: qty 1

## 2015-10-23 MED ORDER — METHYLPREDNISOLONE 4 MG PO TBPK: ORAL_TABLET | ORAL | Status: DC

## 2015-10-23 NOTE — ED Notes (Signed)
Pt to department via EMS following fall.  Pt c/o increased lower back pain.  Per EMS, pt did become slightly dizzy as soon as getting up.

## 2015-10-23 NOTE — Discharge Instructions (Signed)

## 2015-10-23 NOTE — ED Notes (Signed)
Pt gone over to MRI 

## 2015-10-23 NOTE — ED Provider Notes (Signed)
CSN: [id number]     Arrival date & time 10/23/15  1921 History   First MD Initiated Contact with Patient 10/23/15 1925     Chief Complaint  Patient presents with  . Fall     (Consider location/radiation/quality/duration/timing/severity/associated sxs/prior Treatment) HPI  65 y/o female who presents with low back pain that has been chronic - she states that she has severe daily pain , 8/10 at baseline - she presents today stating that her leg gave out on her and she fell in her kitchen while she was preparing food - she has had numbness and weakness in her leg chronically - she has ongoing sx at this time on arrival - she has no urinary sx.  She takes percocet 10mg  tab's 4 times a day chronically as well as other meds for chronic pain and muscle relaxants - she has neurontin as well.  Her back pain is worse today.  Past Medical History  Diagnosis Date  . Arthritis   . Bronchitis   . Hypertension   . Chronic back pain   . Chronic knee pain   . COPD (chronic obstructive pulmonary disease) (HCC)   . Memory difficulty 2015  . Weakness of both legs   . 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 Pain management    Past Surgical History  Procedure Laterality Date  . Knee surgery  2007  . Appendectomy  1971  . Tonsillectomy  1971   Family History  Problem Relation Age of Onset  . Heart failure Father   . Diabetes Father   . Kidney failure Father    Social History  Substance Use Topics  . Smoking status: Current Every Day Smoker -- 0.50 packs/day for 10 years    Types: Cigarettes  . Smokeless tobacco: None     Comment: 04/30/15 less than 1 PPD  . Alcohol Use: 0.0 oz/week    0 Standard drinks or equivalent per week     Comment: Very rare   OB History    Gravida Para Term Preterm AB TAB SAB Ectopic Multiple Living   2 2 2             Review of Systems  All other systems reviewed and are negative.     Allergies   Bee venom; Mushroom extract complex; Amitriptyline; Toradol; Tramadol; and Trazodone and nefazodone  Home Medications   Prior to Admission medications   Medication Sig Start Date End Date Taking? Authorizing Provider  albuterol (PROVENTIL HFA;VENTOLIN HFA) 108 (90 BASE) MCG/ACT inhaler Inhale 2 puffs into the lungs every 6 (six) hours as needed for wheezing. 06/21/13   Nimish Normajean Glasgow, MD  amLODipine (NORVASC) 2.5 MG tablet Take 2 tablets (5 mg total) by mouth daily. 06/30/15   Otis Brace, MD  cyclobenzaprine (FLEXERIL) 10 MG tablet Take 10 mg by mouth 3 (three) times daily as needed for muscle spasms.    Historical Provider, MD  diazepam (VALIUM) 5 MG tablet Take 5 mg by mouth 3 (three) times daily as needed for anxiety.    Historical Provider, MD  diphenhydrAMINE (BENADRYL) 25 MG tablet Take 2 tablets (50 mg total) by mouth every 4 (four) hours as needed for itching. 12/17/13   Wayland Salinas, MD  DULoxetine (CYMBALTA) 60 MG capsule Take 60 mg by mouth at bedtime.     Historical Provider, MD  EVZIO 0.4 MG/0.4ML SOAJ Inject 0.4 mLs into the muscle once as needed (for Anaphylaxis/allergic reaction).  02/19/15   Historical Provider, MD  gabapentin (NEURONTIN) 300 MG capsule Take 300 mg by mouth 3 (three) times daily.    Historical Provider, MD  HYDROcodone-acetaminophen (NORCO) 10-325 MG per tablet Take 1 tablet by mouth every 6 (six) hours as needed for moderate pain or severe pain.    Historical Provider, MD  hydrOXYzine (VISTARIL) 25 MG capsule Take 25 mg by mouth at bedtime.    Historical Provider, MD  metFORMIN (GLUCOPHAGE-XR) 500 MG 24 hr tablet Take 500 mg by mouth at bedtime.  12/15/14   Historical Provider, MD  methylPREDNISolone (MEDROL DOSEPAK) 4 MG TBPK tablet Take 6 day taper 10/23/15   Eber Hong, MD  metoprolol succinate (TOPROL-XL) 25 MG 24 hr tablet Take 1 tablet (25 mg total) by mouth at bedtime. 06/30/15   Otis Brace, MD  naproxen (NAPROSYN) 500 MG tablet  05/16/15   Historical  Provider, MD  omeprazole (PRILOSEC) 20 MG capsule Take 1 capsule (20 mg total) by mouth daily. 06/30/15   Otis Brace, MD  oxyCODONE-acetaminophen (PERCOCET/ROXICET) 5-325 MG tablet Take 1-2 tablets by mouth every 6 (six) hours as needed. 07/10/15   Donnetta Hutching, MD  predniSONE (DELTASONE) 50 MG tablet 1 tablet for 6 days, one half tablet for 6 days 07/10/15   Donnetta Hutching, MD  VOLTAREN 1 % GEL Apply 4 g topically every 8 (eight) hours.  02/06/15   Historical Provider, MD   BP 134/62 mmHg  Pulse 58  Temp(Src) 97.9 F (36.6 C) (Oral)  Resp 19  Ht 5\' 4"  (1.626 m)  Wt 220 lb (99.791 kg)  BMI 37.74 kg/m2  SpO2 93% Physical Exam  Constitutional: She appears well-developed and well-nourished. No distress.  HENT:  Head: Normocephalic and atraumatic.  Mouth/Throat: Oropharynx is clear and moist. No oropharyngeal exudate.  Eyes: Conjunctivae and EOM are normal. Pupils are equal, round, and reactive to light. Right eye exhibits no discharge. Left eye exhibits no discharge. No scleral icterus.  Neck: Normal range of motion. Neck supple. No JVD present. No thyromegaly present.  Cardiovascular: Normal rate, regular rhythm, normal heart sounds and intact distal pulses.  Exam reveals no gallop and no friction rub.   No murmur heard. Pulmonary/Chest: Effort normal and breath sounds normal. No respiratory distress. She has no wheezes. She has no rales.  Abdominal: Soft. Bowel sounds are normal. She exhibits no distension and no mass. There is no tenderness.  Musculoskeletal: Normal range of motion. She exhibits no edema or tenderness.  Lymphadenopathy:    She has no cervical adenopathy.  Neurological: She is alert. Coordination normal.  Pt is able to lift LLE off the bed with difficulty and hold against resistance - does not lift RLE but has no heel drop on the L into the bed with trying.  She does have given out weakness on the R ankle with dorsi / plantar flexion.  UE's with normal sx.  Decreased sensation  in the RLE c/w the LLE to light touch and pinprick below the knee in stocking glove distribution.  Skin: Skin is warm and dry. No rash noted. No erythema.  Psychiatric: She has a normal mood and affect. Her behavior is normal.  Nursing note and vitals reviewed.   ED Course  Procedures (including critical care time) Labs Review Labs Reviewed  CBC - Abnormal; Notable for the following:    RBC 5.86 (*)    Hemoglobin 17.8 (*)    HCT 53.5 (*)    All other components within normal limits  BASIC  METABOLIC PANEL - Abnormal; Notable for the following:    Chloride 99 (*)    Glucose, Bld 118 (*)    All other components within normal limits    Imaging Review Mr Lumbar Spine Wo Contrast  10/23/2015  CLINICAL DATA:  Initial evaluation for acute low back pain status post fall. EXAM: MRI LUMBAR SPINE WITHOUT CONTRAST TECHNIQUE: Multiplanar, multisequence MR imaging of the lumbar spine was performed. No intravenous contrast was administered. COMPARISON:  Previous MRI from 06/29/2015. FINDINGS: For the purposes of this dictation, the lowest well-formed intervertebral disc spaces presumed to be the L5-S1 level, and there presumed to be 5 lumbar type vertebral bodies. Trace anterolisthesis of L4 on L5, stable. Vertebral bodies are otherwise normally aligned with preservation of the normal lumbar lordosis. Vertebral body heights maintained. No acute fracture or listhesis. Conus medullaris terminates normally at the L1 level. Signal intensity within the visualized cord is normal. Nerve roots of the cauda equina within normal limits. Paraspinous soft tissues are normal. T11-12: Seen only on sagittal projection. Mild diffuse annular disc bulge without focal disc herniation. No stenosis. T12-L1: Seen only on sagittal projection. Mild diffuse circumferential disc bulge. No focal disc herniation. No significant stenosis. L1-2: No significant degenerative disc bulging. No focal disc herniation. Moderate facet arthrosis  bilaterally. No canal or foraminal stenosis. L2-3: Diffuse disc bulge with disc desiccation and intervertebral disc space narrowing. Moderate to advanced bilateral facet arthrosis with ligamentum flavum thickening. Resultant moderate canal stenosis, stable. Very mild bilateral foraminal narrowing. L3-4: Mild diffuse annular disc bulging. No focal disc herniation. Moderate facet and ligamentum flavum hypertrophy. Asymmetric fluid within the left L3-4 facet. Mild canal stenosis, stable. No significant foraminal narrowing. L4-5: 3 mm anterolisthesis of L4 on L5. Associated degenerative disc bulge with severe bilateral facet arthrosis. Annular fissure posteriorly. Trace fluid within the bilateral L4-5 facets, likely reactive. Moderate to severe canal stenosis, similar. There is partial effacement of the left lateral recess, also similar, and potentially affecting the traversing left L5 nerve root. Mild left foraminal narrowing L5-S1: No significant disc bulge or focal disc herniation. Moderate to severe right with mild left-sided facet arthrosis. Small 3 mm synovial cyst at the medial aspect of the right L5-S1 facet. No significant canal stenosis. Foramina are widely patent. IMPRESSION: 1. No acute abnormality within the lumbar spine. 2. Moderate to severe canal stenosis at L4-5, with moderate canal stenosis at L2-3. Partial lateral recess effacement at L4-5 could potentially effect the traversing left L5 nerve root. These findings are similar relative to previous MRI from 06/29/2015. 3. Mild foraminal narrowing at L2-3 and L4-5 as above. 4. Multilevel facet arthropathy as above, severe at L4-5. Electronically Signed   By: Rise Mu M.D.   On: 10/23/2015 22:46   I have personally reviewed and evaluated these images and lab results as part of my medical decision-making.    MDM   Final diagnoses:  Back pain  Spinal stenosis of lumbar region    Has give out weakness - chronic numbness in the legs -  seems baseline but reports sx are worsened - MRI to r/o pathological cause - Vs normal - pain improved with hydromorphone.  The patient tells me after the medications that she is back to her baseline. She also reports that she is ready for this d/c, MRI reports no other new findings  Stable for d/c.  Meds given in ED:  Medications  HYDROmorphone (DILAUDID) injection 1 mg (1 mg Intravenous Given 10/23/15 2038)    Discharge  Medication List as of 10/23/2015 10:42 PM    START taking these medications   Details  methylPREDNISolone (MEDROL DOSEPAK) 4 MG TBPK tablet Take 6 day taper, Print          Eber Hong, MD 10/24/15 1400

## 2015-12-21 ENCOUNTER — Observation Stay (HOSPITAL_COMMUNITY)
Admission: EM | Admit: 2015-12-21 | Discharge: 2015-12-22 | Disposition: A | Discharge status: Home / Self Care | Payer: Medicare Other | Attending: Internal Medicine | Admitting: Internal Medicine

## 2015-12-21 ENCOUNTER — Encounter (HOSPITAL_COMMUNITY): Payer: Self-pay | Admitting: *Deleted

## 2015-12-21 ENCOUNTER — Emergency Department (HOSPITAL_COMMUNITY): Payer: Medicare Other

## 2015-12-21 DIAGNOSIS — F32A Depression, unspecified: Secondary | ICD-10-CM | POA: Diagnosis present

## 2015-12-21 DIAGNOSIS — D751 Secondary polycythemia: Secondary | ICD-10-CM | POA: Diagnosis present

## 2015-12-21 DIAGNOSIS — J449 Chronic obstructive pulmonary disease, unspecified: Secondary | ICD-10-CM | POA: Diagnosis not present

## 2015-12-21 DIAGNOSIS — Z79899 Other long term (current) drug therapy: Secondary | ICD-10-CM | POA: Insufficient documentation

## 2015-12-21 DIAGNOSIS — R42 Dizziness and giddiness: Secondary | ICD-10-CM | POA: Diagnosis not present

## 2015-12-21 DIAGNOSIS — F329 Major depressive disorder, single episode, unspecified: Secondary | ICD-10-CM

## 2015-12-21 DIAGNOSIS — I10 Essential (primary) hypertension: Secondary | ICD-10-CM | POA: Diagnosis not present

## 2015-12-21 DIAGNOSIS — W1809XA Striking against other object with subsequent fall, initial encounter: Secondary | ICD-10-CM | POA: Insufficient documentation

## 2015-12-21 DIAGNOSIS — R778 Other specified abnormalities of plasma proteins: Secondary | ICD-10-CM | POA: Diagnosis present

## 2015-12-21 DIAGNOSIS — F410 Panic disorder [episodic paroxysmal anxiety] without agoraphobia: Secondary | ICD-10-CM | POA: Diagnosis present

## 2015-12-21 DIAGNOSIS — R9431 Abnormal electrocardiogram [ECG] [EKG]: Secondary | ICD-10-CM

## 2015-12-21 DIAGNOSIS — Y939 Activity, unspecified: Secondary | ICD-10-CM | POA: Insufficient documentation

## 2015-12-21 DIAGNOSIS — Y929 Unspecified place or not applicable: Secondary | ICD-10-CM | POA: Diagnosis not present

## 2015-12-21 DIAGNOSIS — M48062 Spinal stenosis, lumbar region with neurogenic claudication: Secondary | ICD-10-CM | POA: Diagnosis present

## 2015-12-21 DIAGNOSIS — R7989 Other specified abnormal findings of blood chemistry: Principal | ICD-10-CM | POA: Diagnosis present

## 2015-12-21 DIAGNOSIS — S0990XA Unspecified injury of head, initial encounter: Secondary | ICD-10-CM | POA: Diagnosis present

## 2015-12-21 DIAGNOSIS — F17210 Nicotine dependence, cigarettes, uncomplicated: Secondary | ICD-10-CM | POA: Insufficient documentation

## 2015-12-21 DIAGNOSIS — W19XXXA Unspecified fall, initial encounter: Secondary | ICD-10-CM | POA: Diagnosis not present

## 2015-12-21 DIAGNOSIS — Y999 Unspecified external cause status: Secondary | ICD-10-CM | POA: Diagnosis not present

## 2015-12-21 DIAGNOSIS — M4806 Spinal stenosis, lumbar region: Secondary | ICD-10-CM

## 2015-12-21 HISTORY — DX: Repeated falls: R29.6

## 2015-12-21 HISTORY — DX: Dizziness and giddiness: R42

## 2015-12-21 HISTORY — DX: Depression, unspecified: F32.A

## 2015-12-21 LAB — TROPONIN I
TROPONIN I: 0.24 ng/mL — AB (ref <0.031)
TROPONIN I: 0.22 ng/mL — AB (ref <0.031)
Troponin I: 0.17 ng/mL — ABNORMAL HIGH (ref <0.031)

## 2015-12-21 LAB — CBC WITH DIFFERENTIAL/PLATELET
BASOS ABS: 0.1 10*3/uL (ref 0.0–0.1)
Basophils Relative: 1 %
Eosinophils Absolute: 0.3 10*3/uL (ref 0.0–0.7)
Eosinophils Relative: 3 %
HEMATOCRIT: 53.3 % — AB (ref 36.0–46.0)
HEMOGLOBIN: 17.7 g/dL — AB (ref 12.0–15.0)
LYMPHS PCT: 44 %
Lymphs Abs: 3.8 10*3/uL (ref 0.7–4.0)
MCH: 29.6 pg (ref 26.0–34.0)
MCHC: 33.2 g/dL (ref 30.0–36.0)
MCV: 89.3 fL (ref 78.0–100.0)
MONO ABS: 0.6 10*3/uL (ref 0.1–1.0)
Monocytes Relative: 7 %
NEUTROS ABS: 4 10*3/uL (ref 1.7–7.7)
NEUTROS PCT: 45 %
Platelets: 234 10*3/uL (ref 150–400)
RBC: 5.97 MIL/uL — ABNORMAL HIGH (ref 3.87–5.11)
RDW: 13.9 % (ref 11.5–15.5)
WBC: 8.7 10*3/uL (ref 4.0–10.5)

## 2015-12-21 LAB — BASIC METABOLIC PANEL
ANION GAP: 9 (ref 5–15)
BUN: 11 mg/dL (ref 6–20)
CO2: 28 mmol/L (ref 22–32)
Calcium: 9 mg/dL (ref 8.9–10.3)
Chloride: 101 mmol/L (ref 101–111)
Creatinine, Ser: 0.56 mg/dL (ref 0.44–1.00)
GFR calc non Af Amer: >60 mL/min (ref >60)
GLUCOSE: 106 mg/dL — AB (ref 65–99)
POTASSIUM: 4 mmol/L (ref 3.5–5.1)
Sodium: 138 mmol/L (ref 135–145)

## 2015-12-21 LAB — TSH: TSH: 2.655 u[IU]/mL (ref 0.350–4.500)

## 2015-12-21 MED ORDER — OXYCODONE-ACETAMINOPHEN 5-325 MG PO TABS
1.0000 | ORAL_TABLET | Freq: Four times a day (QID) | ORAL | Status: DC | PRN
  Administered 2015-12-21 – 2015-12-22 (×3): 1 via ORAL
  Filled 2015-12-21 (×3): qty 1

## 2015-12-21 MED ORDER — AMLODIPINE BESYLATE 5 MG PO TABS
5.0000 mg | ORAL_TABLET | Freq: Every day | ORAL | Status: DC
Start: 2015-12-22 — End: 2015-12-22
  Administered 2015-12-21 – 2015-12-22 (×2): 5 mg via ORAL
  Filled 2015-12-21 (×2): qty 1

## 2015-12-21 MED ORDER — GABAPENTIN 300 MG PO CAPS
300.0000 mg | ORAL_CAPSULE | Freq: Three times a day (TID) | ORAL | Status: DC
  Administered 2015-12-21 – 2015-12-22 (×3): 300 mg via ORAL
  Filled 2015-12-21 (×3): qty 1

## 2015-12-21 MED ORDER — MECLIZINE HCL 12.5 MG PO TABS
25.0000 mg | ORAL_TABLET | Freq: Once | ORAL | Status: AC
  Administered 2015-12-21: 25 mg via ORAL
  Filled 2015-12-21: qty 2

## 2015-12-21 MED ORDER — SODIUM CHLORIDE 0.9 % IV SOLN
INTRAVENOUS | Status: AC
  Administered 2015-12-21: 19:00:00 via INTRAVENOUS

## 2015-12-21 MED ORDER — ACETAMINOPHEN 325 MG PO TABS
650.0000 mg | ORAL_TABLET | Freq: Once | ORAL | Status: AC
  Administered 2015-12-21: 650 mg via ORAL
  Filled 2015-12-21: qty 2

## 2015-12-21 MED ORDER — PANTOPRAZOLE SODIUM 40 MG PO TBEC
40.0000 mg | DELAYED_RELEASE_TABLET | Freq: Every day | ORAL | Status: DC
  Administered 2015-12-21 – 2015-12-22 (×2): 40 mg via ORAL
  Filled 2015-12-21 (×2): qty 1

## 2015-12-21 MED ORDER — OXYCODONE-ACETAMINOPHEN 10-325 MG PO TABS: 1.0000 | ORAL_TABLET | Freq: Four times a day (QID) | ORAL | Status: DC | PRN

## 2015-12-21 MED ORDER — POLYETHYLENE GLYCOL 3350 17 G PO PACK: 17.0000 g | PACK | Freq: Every day | ORAL | Status: DC | PRN

## 2015-12-21 MED ORDER — DICLOFENAC SODIUM 1 % TD GEL
TRANSDERMAL | Status: AC
  Filled 2015-12-21: qty 100

## 2015-12-21 MED ORDER — ENOXAPARIN SODIUM 60 MG/0.6ML ~~LOC~~ SOLN
50.0000 mg | SUBCUTANEOUS | Status: DC
  Administered 2015-12-21: 50 mg via SUBCUTANEOUS
  Filled 2015-12-21: qty 0.6

## 2015-12-21 MED ORDER — SODIUM CHLORIDE 0.9% FLUSH
3.0000 mL | Freq: Two times a day (BID) | INTRAVENOUS | Status: DC
  Administered 2015-12-21: 3 mL via INTRAVENOUS

## 2015-12-21 MED ORDER — DULOXETINE HCL 60 MG PO CPEP
60.0000 mg | ORAL_CAPSULE | Freq: Every day | ORAL | Status: DC
Start: 2015-12-22 — End: 2015-12-22

## 2015-12-21 MED ORDER — ACETAMINOPHEN 650 MG RE SUPP: 650.0000 mg | Freq: Four times a day (QID) | RECTAL | Status: DC | PRN

## 2015-12-21 MED ORDER — DICLOFENAC SODIUM 1 % TD GEL
4.0000 g | Freq: Three times a day (TID) | TRANSDERMAL | Status: DC
  Administered 2015-12-21 – 2015-12-22 (×2): 4 g via TOPICAL
  Filled 2015-12-21: qty 100

## 2015-12-21 MED ORDER — ALUM & MAG HYDROXIDE-SIMETH 200-200-20 MG/5ML PO SUSP: 30.0000 mL | Freq: Four times a day (QID) | ORAL | Status: DC | PRN

## 2015-12-21 MED ORDER — SODIUM CHLORIDE 0.9 % IV SOLN: 250.0000 mL | INTRAVENOUS | Status: DC | PRN

## 2015-12-21 MED ORDER — ASPIRIN 325 MG PO TABS
325.0000 mg | ORAL_TABLET | Freq: Every day | ORAL | Status: DC
  Administered 2015-12-21 – 2015-12-22 (×2): 325 mg via ORAL
  Filled 2015-12-21 (×2): qty 1

## 2015-12-21 MED ORDER — ONDANSETRON HCL 4 MG/2ML IJ SOLN: 4.0000 mg | Freq: Four times a day (QID) | INTRAMUSCULAR | Status: DC | PRN

## 2015-12-21 MED ORDER — OXYCODONE HCL 5 MG PO TABS
5.0000 mg | ORAL_TABLET | Freq: Four times a day (QID) | ORAL | Status: DC | PRN
  Administered 2015-12-21 – 2015-12-22 (×2): 5 mg via ORAL
  Filled 2015-12-21 (×2): qty 1

## 2015-12-21 MED ORDER — ONDANSETRON HCL 4 MG PO TABS: 4.0000 mg | ORAL_TABLET | Freq: Four times a day (QID) | ORAL | Status: DC | PRN

## 2015-12-21 MED ORDER — ALBUTEROL SULFATE (2.5 MG/3ML) 0.083% IN NEBU: 3.0000 mL | INHALATION_SOLUTION | Freq: Four times a day (QID) | RESPIRATORY_TRACT | Status: DC | PRN

## 2015-12-21 MED ORDER — SODIUM CHLORIDE 0.9% FLUSH: 3.0000 mL | INTRAVENOUS | Status: DC | PRN

## 2015-12-21 MED ORDER — SODIUM CHLORIDE 0.9% FLUSH
3.0000 mL | Freq: Two times a day (BID) | INTRAVENOUS | Status: DC
  Administered 2015-12-21 – 2015-12-22 (×2): 3 mL via INTRAVENOUS

## 2015-12-21 MED ORDER — ACETAMINOPHEN 325 MG PO TABS
650.0000 mg | ORAL_TABLET | Freq: Four times a day (QID) | ORAL | Status: DC | PRN
  Administered 2015-12-22: 650 mg via ORAL
  Filled 2015-12-21: qty 2

## 2015-12-21 NOTE — ED Notes (Signed)
Tolerated 50% of diet well.

## 2015-12-21 NOTE — ED Provider Notes (Signed)
CSN: [id number]     Arrival date & time 12/21/15  6295 History   First MD Initiated Contact with Patient 12/21/15 [phone]     Chief Complaint  Patient presents with  . Fall      HPI  Pt was seen at 1000. Per pt, c/o sudden onset and resolution of one episode of fall that occurred PTA. Pt states she "fell against the truck," hitting her head, left neck and shoulder areas. Endorses hx of frequent falls. Pt's family states pt has been c/o "dizziness" for "months and months now." Pt states she has hx of "crystals in my ears" that cause her dizziness. Denies LOC, no AMS, no change in her usual chronic symptoms, no back pain, no CP/palpitations, no SOB/cough, no abd pain, no N/V/D, no focal motor weakness, no tingling/numbness in extremities. The symptoms have been associated with no other complaints. The patient has a significant history of similar symptoms previously, recently being evaluated for this complaint and multiple prior evals for same.     Past Medical History  Diagnosis Date  . Arthritis   . Bronchitis   . Hypertension   . Chronic back pain   . Chronic knee pain   . COPD (chronic obstructive pulmonary disease) (HCC)   . Memory difficulty 2015  . Weakness of both legs   . 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 Pain management   . Frequent falls   . Dizziness    Past Surgical History  Procedure Laterality Date  . Knee surgery  2007  . Appendectomy  1971  . Tonsillectomy  1971   Family History  Problem Relation Age of Onset  . Heart failure Father   . Diabetes Father   . Kidney failure Father    Social History  Substance Use Topics  . Smoking status: Current Every Day Smoker -- 0.50 packs/day for 10 years    Types: Cigarettes  . Smokeless tobacco: None     Comment: 04/30/15 less than 1 PPD  . Alcohol Use: 0.0 oz/week    0 Standard drinks or equivalent per week     Comment: Very rare   OB History     Gravida Para Term Preterm AB TAB SAB Ectopic Multiple Living   2 2 2             Review of Systems ROS: Statement: All systems negative except as marked or noted in the HPI; Constitutional: Negative for fever and chills. ; ; Eyes: Negative for eye pain, redness and discharge. ; ; ENMT: Negative for ear pain, hoarseness, nasal congestion, sinus pressure and sore throat. ; ; Cardiovascular: Negative for chest pain, palpitations, diaphoresis, dyspnea and peripheral edema. ; ; Respiratory: Negative for cough, wheezing and stridor. ; ; Gastrointestinal: Negative for nausea, vomiting, diarrhea, abdominal pain, blood in stool, hematemesis, jaundice and rectal bleeding. . ; ; Genitourinary: Negative for dysuria, flank pain and hematuria. ; ; Musculoskeletal: +left neck/shoulder pain, head injury. Negative for back pain. Negative for swelling and trauma.; ; Skin: Negative for pruritus, rash, abrasions, blisters, bruising and skin lesion.; ; Neuro: +"dizziness." Negative for headache, lightheadedness and neck stiffness. Negative for weakness, altered level of consciousness , altered mental status, extremity weakness, paresthesias, involuntary movement, seizure and syncope.      Allergies  Bee venom; Mushroom extract complex; Amitriptyline; Toradol; Tramadol; and Trazodone and nefazodone  Home Medications   Prior to Admission medications   Medication Sig Start Date End  Date Taking? Authorizing Provider  albuterol (PROVENTIL HFA;VENTOLIN HFA) 108 (90 BASE) MCG/ACT inhaler Inhale 2 puffs into the lungs every 6 (six) hours as needed for wheezing. 06/21/13  Yes Nimish Normajean Glasgow, MD  amLODipine (NORVASC) 2.5 MG tablet Take 2 tablets (5 mg total) by mouth daily. 06/30/15  Yes Otis Brace, MD  cyclobenzaprine (FLEXERIL) 10 MG tablet Take 10 mg by mouth 3 (three) times daily as needed for muscle spasms.   Yes Historical Provider, MD  diazepam (VALIUM) 5 MG tablet Take 5 mg by mouth 3 (three) times daily as needed for  anxiety.   Yes Historical Provider, MD  diphenhydrAMINE (BENADRYL) 25 MG tablet Take 2 tablets (50 mg total) by mouth every 4 (four) hours as needed for itching. 12/17/13  Yes Wayland Salinas, MD  DULoxetine (CYMBALTA) 60 MG capsule Take 60 mg by mouth at bedtime.    Yes Historical Provider, MD  gabapentin (NEURONTIN) 300 MG capsule Take 300 mg by mouth 3 (three) times daily.   Yes Historical Provider, MD  hydrOXYzine (VISTARIL) 25 MG capsule Take 25 mg by mouth at bedtime.   Yes Historical Provider, MD  metFORMIN (GLUCOPHAGE-XR) 500 MG 24 hr tablet Take 500 mg by mouth at bedtime.  12/15/14  Yes Historical Provider, MD  metoprolol succinate (TOPROL-XL) 50 MG 24 hr tablet Take 50 mg by mouth at bedtime. 12/14/15  Yes Historical Provider, MD  omeprazole (PRILOSEC) 20 MG capsule Take 1 capsule (20 mg total) by mouth daily. 06/30/15  Yes Otis Brace, MD  oxyCODONE-acetaminophen (PERCOCET) 10-325 MG tablet Take 1 tablet by mouth every 6 (six) hours as needed for pain.   Yes Historical Provider, MD  VOLTAREN 1 % GEL Apply 4 g topically every 8 (eight) hours.  02/06/15  Yes Historical Provider, MD  EVZIO 0.4 MG/0.4ML SOAJ Inject 0.4 mLs into the muscle once as needed (for Anaphylaxis/allergic reaction).  02/19/15   Historical Provider, MD  HYDROcodone-acetaminophen (NORCO) 10-325 MG per tablet Take 1 tablet by mouth every 6 (six) hours as needed for moderate pain or severe pain. Reported on 12/21/2015    Historical Provider, MD  methylPREDNISolone (MEDROL DOSEPAK) 4 MG TBPK tablet Take 6 day taper Patient not taking: Reported on 12/21/2015 10/23/15   Eber Hong, MD  metoprolol succinate (TOPROL-XL) 25 MG 24 hr tablet Take 1 tablet (25 mg total) by mouth at bedtime. Patient not taking: Reported on 12/21/2015 06/30/15   Otis Brace, MD  oxyCODONE-acetaminophen (PERCOCET/ROXICET) 5-325 MG tablet Take 1-2 tablets by mouth every 6 (six) hours as needed. Patient not taking: Reported on 12/21/2015 07/10/15   Donnetta Hutching, MD   predniSONE (DELTASONE) 50 MG tablet 1 tablet for 6 days, one half tablet for 6 days Patient not taking: Reported on 12/21/2015 07/10/15   Donnetta Hutching, MD   BP 133/52 mmHg  Pulse 69  Temp(Src) 97.8 F (36.6 C) (Oral)  Resp 26  Ht 5\' 4"  (1.626 m)  Wt 225 lb (102.059 kg)  BMI 38.60 kg/m2  SpO2 97%   11:18:13 Orthostatic Vital Signs DA  Orthostatic Lying  - BP- Lying: 132/79 mmHg (c/o slight dizziness and c/o headache. Sats 96% on RA) ; Pulse- Lying: 69  Orthostatic Sitting - BP- Sitting: 108/59 mmHg (Sats 100% on RA, C/o dizziness) ; Pulse- Sitting: 54  Orthostatic Standing at 0 minutes - BP- Standing at 0 minutes: 128/88 mmHg (Sats 99% c/o of room is spinning.) ; Pulse- Standing at 0 minutes: 51     Patient Vitals for the past 24  hrs:  BP Temp Temp src Pulse Resp SpO2 Height Weight  12/21/15 1318 160/80 mmHg - - 67 19 94 % - -  12/21/15 1100 126/75 mmHg - - (!) 49 19 98 % - -  12/21/15 1030 122/74 mmHg - - (!) 50 22 94 % - -  12/21/15 1000 124/74 mmHg - - (!) 55 23 93 % - -  12/21/15 0940 (!) 133/52 mmHg 97.8 F (36.6 C) Oral 69 26 97 % 5\' 4"  (1.626 m) 225 lb (102.059 kg)     Physical Exam  1005: Physical examination:  Nursing notes reviewed; Vital signs and O2 SAT reviewed;  Constitutional: Well developed, Well nourished, Well hydrated, In no acute distress; Head:  Normocephalic, atraumatic; Eyes: EOMI, PERRL, No scleral icterus; ENMT: Mouth and pharynx normal, Mucous membranes moist; Neck: Supple, Full range of motion, No lymphadenopathy; Cardiovascular: Regular rate and rhythm, No gallop; Respiratory: Breath sounds clear & equal bilaterally, No wheezes.  Speaking full sentences with ease, Normal respiratory effort/excursion; Chest: Nontender, Movement normal; Abdomen: Soft, Nontender, Nondistended, Normal bowel sounds; Genitourinary: No CVA tenderness; Spine:  No midline CS, TS, LS tenderness. +TTP left hypertonic trapezius muscle. No rash, no abrasions, no ecchymosis.;; Extremities:  Pulses normal, Left shoulder w/FROM.  NT to palp entire joint, AC joint, clavicle NT, scapula NT, proximal humerus NT, biceps tendon NT over bicipital groove.  Motor strength at shoulder normal.  Sensation intact over deltoid region, distal NMS intact with left hand having intact and equal sensation and strength in the distribution of the median, radial, and ulnar nerve function compared to opposite side.  Strong radial pulse.  +FROM left elbow with intact motor strength biceps and triceps muscles to resistance. No deformity. No abrasions, no ecchymosis. No tenderness, No edema, No calf edema or asymmetry.; Neuro: AA&Ox3, Major CN grossly intact. No facial droop. Speech clear. Grips equal. Strength 5/5 equal bilat UE's and LE's. No gross focal motor or sensory deficits in extremities.; Skin: Color normal, Warm, Dry.    ED Course  Procedures (including critical care time) Labs Review  Imaging Review  I have personally reviewed and evaluated these images and lab results as part of my medical decision-making.   EKG Interpretation   Date/Time:  Friday December 21 2015 09:38:48 EST Ventricular Rate:  68 PR Interval:  164 QRS Duration: 89 QT Interval:  412 QTC Calculation: 438 R Axis:   61 Text Interpretation:  Sinus rhythm Low voltage, precordial leads  Borderline T abnormalities, inferior leads Baseline wander When compared  with ECG of 04/27/2015 No significant change was found Confirmed by [facility]   MD, Nicholos Johns [phone]) on 12/21/2015 10:18:35 AM      MDM  MDM Reviewed: previous chart, vitals and nursing note Reviewed previous: labs and ECG Interpretation: labs, ECG, x-ray and CT scan   Results for orders placed or performed during the hospital encounter of 12/21/15  Basic metabolic panel  Result Value Ref Range   Sodium 138 135 - 145 mmol/L   Potassium 4.0 3.5 - 5.1 mmol/L   Chloride 101 101 - 111 mmol/L   CO2 28 22 - 32 mmol/L   Glucose, Bld 106 (H) 65 - 99 mg/dL   BUN 11 6 - 20  mg/dL   Creatinine, Ser 6.04 0.44 - 1.00 mg/dL   Calcium 9.0 8.9 - 54.0 mg/dL   GFR calc non Af Amer >60 >60 mL/min   GFR calc Af Amer >60 >60 mL/min   Anion gap 9 5 - 15  CBC  with Differential  Result Value Ref Range   WBC 8.7 4.0 - 10.5 K/uL   RBC 5.97 (H) 3.87 - 5.11 MIL/uL   Hemoglobin 17.7 (H) 12.0 - 15.0 g/dL   HCT 16.1 (H) 09.6 - 04.5 %   MCV 89.3 78.0 - 100.0 fL   MCH 29.6 26.0 - 34.0 pg   MCHC 33.2 30.0 - 36.0 g/dL   RDW 40.9 81.1 - 91.4 %   Platelets 234 150 - 400 K/uL   Neutrophils Relative % 45 %   Neutro Abs 4.0 1.7 - 7.7 K/uL   Lymphocytes Relative 44 %   Lymphs Abs 3.8 0.7 - 4.0 K/uL   Monocytes Relative 7 %   Monocytes Absolute 0.6 0.1 - 1.0 K/uL   Eosinophils Relative 3 %   Eosinophils Absolute 0.3 0.0 - 0.7 K/uL   Basophils Relative 1 %   Basophils Absolute 0.1 0.0 - 0.1 K/uL  Troponin I  Result Value Ref Range   Troponin I 0.17 (H) <0.031 ng/mL   Ct Head Wo Contrast 12/21/2015  CLINICAL DATA:  Larey Seat and hit head.  Headache. EXAM: CT HEAD WITHOUT CONTRAST CT CERVICAL SPINE WITHOUT CONTRAST TECHNIQUE: Multidetector CT imaging of the head and cervical spine was performed following the standard protocol without intravenous contrast. Multiplanar CT image reconstructions of the cervical spine were also generated. COMPARISON:  CT 06/28/2015 FINDINGS: CT HEAD FINDINGS Ventricle size normal. No acute infarct. Negative for intracranial hemorrhage. Negative for mass or edema. Negative for skull fracture. Chronic bony thickening left maxillary sinus due to chronic sinusitis. No air-fluid level. CT CERVICAL SPINE FINDINGS Straightening of the cervical lordosis. Normal alignment. Disc degeneration and spondylosis at C5-6. Left foraminal narrowing due to spurring at C5-6. Negative for fracture IMPRESSION: No acute intracranial abnormality Cervical spondylosis.  Negative for cervical spine fracture Electronically Signed   By: Marlan Palau M.D.   On: 12/21/2015 12:17   Ct Cervical  Spine Wo Contrast 12/21/2015  CLINICAL DATA:  Larey Seat and hit head.  Headache. EXAM: CT HEAD WITHOUT CONTRAST CT CERVICAL SPINE WITHOUT CONTRAST TECHNIQUE: Multidetector CT imaging of the head and cervical spine was performed following the standard protocol without intravenous contrast. Multiplanar CT image reconstructions of the cervical spine were also generated. COMPARISON:  CT 06/28/2015 FINDINGS: CT HEAD FINDINGS Ventricle size normal. No acute infarct. Negative for intracranial hemorrhage. Negative for mass or edema. Negative for skull fracture. Chronic bony thickening left maxillary sinus due to chronic sinusitis. No air-fluid level. CT CERVICAL SPINE FINDINGS Straightening of the cervical lordosis. Normal alignment. Disc degeneration and spondylosis at C5-6. Left foraminal narrowing due to spurring at C5-6. Negative for fracture IMPRESSION: No acute intracranial abnormality Cervical spondylosis.  Negative for cervical spine fracture Electronically Signed   By: Marlan Palau M.D.   On: 12/21/2015 12:17   Dg Shoulder Left 12/21/2015  CLINICAL DATA:  Larey Seat backwards today, left shoulder pain EXAM: LEFT SHOULDER - 2+ VIEW COMPARISON:  None. FINDINGS: There is no evidence of fracture or dislocation. There is no evidence of arthropathy or other focal bone abnormality. Soft tissues are unremarkable. IMPRESSION: Negative. Electronically Signed   By: Esperanza Heir M.D.   On: 12/21/2015 12:14     1115:  Pt told ED RN during VS that she "couldn't stand." When I questioned pt: she said, "oh no, the nurse told me not to stand, I said I would." Pt strongly encouraged to do so. Will again perform full orthostatic VS.   1325:  Pt is able to  stand during VS, only c/o "everything moving around." PO antivert given. Neuro exam continues intact and unchanged. Troponin elevated, no reported CP and EKG appears similar to EKG in 2016. Will obtain repeat troponin. T/C to [facility] Dr. Purvis Sheffield, case discussed, including:  HPI,  pertinent PM/SHx, VS/PE, dx testing, ED course and treatment:  Agreeable to consult, agrees to cycle troponins.  T/C to [facility] Dr. Darnelle Catalan, case discussed, including:  HPI, pertinent PM/SHx, VS/PE, dx testing, ED course and treatment:  Agreeable to admit, requests to write temporary orders, obtain observation tele bed to team APAdmits.   Samuel Jester, DO 12/24/15 1629

## 2015-12-21 NOTE — ED Notes (Signed)
Hospitialist MD at bedside.

## 2015-12-21 NOTE — Consult Note (Signed)
CARDIOLOGY CONSULT NOTE  Patient ID: Carla [name] MRN: [id number] DOB/AGE: 16-Jan-1951 65 y.o.  Admit date: 12/21/2015 Primary Physician [name], MD  Reason for Consultation: abnormal ECG, troponin elevation  HPI: The patient is a 65 yr old woman with a h/o chronic pain syndrome, pseudodementia, and memory loss who presented with shoulder pain and a headache after a fall. She reports dizziness which has been consistent over the past month but denies syncope. Very seldom has exertional chest pain and dyspnea. Denies a h/o MI.  Troponins: 0.17-->0.24. ECG shows sinus rhythm with nonspecific T wave abnormalities in the inferior leads. Echocardiogram on 06/29/15 demonstrated normal LV systolic function, EF 60%. Regional wall motion abnormalities could not be excluded as it was a very technically limited study.    Allergies  Allergen Reactions  . Bee Venom Anaphylaxis  . Mushroom Extract Complex Anaphylaxis    Extreme sweating, vomiting  . Amitriptyline Other (See Comments)    Has nightmares when using, not in right state of mind   . Toradol [Ketorolac Tromethamine] Nausea And Vomiting  . Tramadol Nausea And Vomiting  . Trazodone And Nefazodone Nausea And Vomiting    Current Facility-Administered Medications  Medication Dose Route Frequency Provider Last Rate Last Dose  . aspirin tablet 325 mg  325 mg Oral Daily Maryruth Bun Rama, MD   325 mg at 12/21/15 1433   Current Outpatient Prescriptions  Medication Sig Dispense Refill  . albuterol (PROVENTIL HFA;VENTOLIN HFA) 108 (90 BASE) MCG/ACT inhaler Inhale 2 puffs into the lungs every 6 (six) hours as needed for wheezing. 1 Inhaler 2  . amLODipine (NORVASC) 2.5 MG tablet Take 2 tablets (5 mg total) by mouth daily.    . cyclobenzaprine (FLEXERIL) 10 MG tablet Take 10 mg by mouth 3 (three) times daily as needed for muscle spasms.    . diazepam (VALIUM) 5 MG tablet Take 5 mg by mouth 3 (three) times daily as needed for  anxiety.    . diphenhydrAMINE (BENADRYL) 25 MG tablet Take 2 tablets (50 mg total) by mouth every 4 (four) hours as needed for itching. 20 tablet 0  . DULoxetine (CYMBALTA) 60 MG capsule Take 60 mg by mouth at bedtime.     . gabapentin (NEURONTIN) 300 MG capsule Take 300 mg by mouth 3 (three) times daily.    . hydrOXYzine (VISTARIL) 25 MG capsule Take 25 mg by mouth at bedtime.    . metFORMIN (GLUCOPHAGE-XR) 500 MG 24 hr tablet Take 500 mg by mouth at bedtime.     . metoprolol succinate (TOPROL-XL) 50 MG 24 hr tablet Take 50 mg by mouth at bedtime.    Marland Kitchen omeprazole (PRILOSEC) 20 MG capsule Take 1 capsule (20 mg total) by mouth daily. 30 capsule 0  . oxyCODONE-acetaminophen (PERCOCET) 10-325 MG tablet Take 1 tablet by mouth every 6 (six) hours as needed for pain.    Marland Kitchen VOLTAREN 1 % GEL Apply 4 g topically every 8 (eight) hours.     Marland Kitchen EVZIO 0.4 MG/0.4ML SOAJ Inject 0.4 mLs into the muscle once as needed (for Anaphylaxis/allergic reaction).     Marland Kitchen HYDROcodone-acetaminophen (NORCO) 10-325 MG per tablet Take 1 tablet by mouth every 6 (six) hours as needed for moderate pain or severe pain. Reported on 12/21/2015    . methylPREDNISolone (MEDROL DOSEPAK) 4 MG TBPK tablet Take 6 day taper (Patient not taking: Reported on 12/21/2015) 21 tablet 0  . metoprolol succinate (TOPROL-XL) 25 MG 24 hr tablet Take 1  tablet (25 mg total) by mouth at bedtime. (Patient not taking: Reported on 12/21/2015) 30 tablet 2  . oxyCODONE-acetaminophen (PERCOCET/ROXICET) 5-325 MG tablet Take 1-2 tablets by mouth every 6 (six) hours as needed. (Patient not taking: Reported on 12/21/2015) 16 tablet 0  . predniSONE (DELTASONE) 50 MG tablet 1 tablet for 6 days, one half tablet for 6 days (Patient not taking: Reported on 12/21/2015) 9 tablet 0    Past Medical History  Diagnosis Date  . Arthritis   . Bronchitis   . Hypertension   . Chronic back pain   . Chronic knee pain   . COPD (chronic obstructive pulmonary disease) (HCC)   . Memory  difficulty 2015  . Weakness of both legs   . 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 Pain management   . Frequent falls   . Dizziness     Past Surgical History  Procedure Laterality Date  . Knee surgery  2007  . Appendectomy  1971  . Tonsillectomy  1971    Social History   Social History  . Marital Status: Widowed    Spouse Name: N/A  . Number of Children: 2  . Years of Education: 12   Occupational History  . Retired    Social History Main Topics  . Smoking status: Current Every Day Smoker -- 0.50 packs/day for 10 years    Types: Cigarettes  . Smokeless tobacco: Not on file     Comment: 04/30/15 less than 1 PPD  . Alcohol Use: 0.0 oz/week    0 Standard drinks or equivalent per week     Comment: Very rare  . Drug Use: No  . Sexual Activity: Not on file   Other Topics Concern  . Not on file   Social History Narrative   Lives at home with your 2 daughters, son-in-law, 4 grandkids and more   Right and left handed   Caffeine use: very rare .Has coke 2 times a week.        No family history of premature CAD in 1st degree relatives.  Prior to Admission medications   Medication Sig Start Date End Date Taking? Authorizing Provider  albuterol (PROVENTIL HFA;VENTOLIN HFA) 108 (90 BASE) MCG/ACT inhaler Inhale 2 puffs into the lungs every 6 (six) hours as needed for wheezing. 06/21/13  Yes Nimish Normajean Glasgow, MD  amLODipine (NORVASC) 2.5 MG tablet Take 2 tablets (5 mg total) by mouth daily. 06/30/15  Yes Otis Brace, MD  cyclobenzaprine (FLEXERIL) 10 MG tablet Take 10 mg by mouth 3 (three) times daily as needed for muscle spasms.   Yes Historical Provider, MD  diazepam (VALIUM) 5 MG tablet Take 5 mg by mouth 3 (three) times daily as needed for anxiety.   Yes Historical Provider, MD  diphenhydrAMINE (BENADRYL) 25 MG tablet Take 2 tablets (50 mg total) by mouth every 4 (four) hours as needed for itching. 12/17/13  Yes  Wayland Salinas, MD  DULoxetine (CYMBALTA) 60 MG capsule Take 60 mg by mouth at bedtime.    Yes Historical Provider, MD  gabapentin (NEURONTIN) 300 MG capsule Take 300 mg by mouth 3 (three) times daily.   Yes Historical Provider, MD  hydrOXYzine (VISTARIL) 25 MG capsule Take 25 mg by mouth at bedtime.   Yes Historical Provider, MD  metFORMIN (GLUCOPHAGE-XR) 500 MG 24 hr tablet Take 500 mg by mouth at bedtime.  12/15/14  Yes Historical Provider, MD  metoprolol succinate (TOPROL-XL) 50 MG 24  hr tablet Take 50 mg by mouth at bedtime. 12/14/15  Yes Historical Provider, MD  omeprazole (PRILOSEC) 20 MG capsule Take 1 capsule (20 mg total) by mouth daily. 06/30/15  Yes Otis Brace, MD  oxyCODONE-acetaminophen (PERCOCET) 10-325 MG tablet Take 1 tablet by mouth every 6 (six) hours as needed for pain.   Yes Historical Provider, MD  VOLTAREN 1 % GEL Apply 4 g topically every 8 (eight) hours.  02/06/15  Yes Historical Provider, MD  EVZIO 0.4 MG/0.4ML SOAJ Inject 0.4 mLs into the muscle once as needed (for Anaphylaxis/allergic reaction).  02/19/15   Historical Provider, MD  HYDROcodone-acetaminophen (NORCO) 10-325 MG per tablet Take 1 tablet by mouth every 6 (six) hours as needed for moderate pain or severe pain. Reported on 12/21/2015    Historical Provider, MD  methylPREDNISolone (MEDROL DOSEPAK) 4 MG TBPK tablet Take 6 day taper Patient not taking: Reported on 12/21/2015 10/23/15   Eber Hong, MD  metoprolol succinate (TOPROL-XL) 25 MG 24 hr tablet Take 1 tablet (25 mg total) by mouth at bedtime. Patient not taking: Reported on 12/21/2015 06/30/15   Otis Brace, MD  oxyCODONE-acetaminophen (PERCOCET/ROXICET) 5-325 MG tablet Take 1-2 tablets by mouth every 6 (six) hours as needed. Patient not taking: Reported on 12/21/2015 07/10/15   Donnetta Hutching, MD  predniSONE (DELTASONE) 50 MG tablet 1 tablet for 6 days, one half tablet for 6 days Patient not taking: Reported on 12/21/2015 07/10/15   Donnetta Hutching, MD     Review of  systems complete and found to be negative unless listed above in HPI     Physical exam Blood pressure 137/72, pulse 59, temperature 97.8 F (36.6 C), temperature source Oral, resp. rate 21, height  (1.626 m), weight 225 lb (102.059 kg), SpO2 98 %. General: NAD Neck: No JVD, no thyromegaly or thyroid nodule.  Lungs: Clear to auscultation bilaterally with normal respiratory effort. CV: Nondisplaced PMI. Regular rate and rhythm, normal S1/S2, no S3/S4, no murmur.  No peripheral edema.   Abdomen: Soft,obese. Skin: Intact without lesions or rashes.  Neurologic: Alert and oriented x 3.  Psych: Normal affect. HEENT: Normal.   ECG: Most recent ECG reviewed.  Labs:   Lab Results  Component Value Date   WBC 8.7 12/21/2015   HGB 17.7* 12/21/2015   HCT 53.3* 12/21/2015   MCV 89.3 12/21/2015   PLT 234 12/21/2015    Recent Labs Lab 12/21/15 1210  NA 138  K 4.0  CL 101  CO2 28  BUN 11  CREATININE 0.56  CALCIUM 9.0  GLUCOSE 106*   Lab Results  Component Value Date   CKTOTAL 29* 06/30/2015   CKMB 4.2* 03/08/2012   TROPONINI 0.24* 12/21/2015   No results found for: CHOL No results found for: HDL No results found for: LDLCALC No results found for: TRIG No results found for: CHOLHDL No results found for: LDLDIRECT       Studies: Ct Head Wo Contrast  12/21/2015  CLINICAL DATA:  Larey Seat and hit head.  Headache. EXAM: CT HEAD WITHOUT CONTRAST CT CERVICAL SPINE WITHOUT CONTRAST TECHNIQUE: Multidetector CT imaging of the head and cervical spine was performed following the standard protocol without intravenous contrast. Multiplanar CT image reconstructions of the cervical spine were also generated. COMPARISON:  CT 06/28/2015 FINDINGS: CT HEAD FINDINGS Ventricle size normal. No acute infarct. Negative for intracranial hemorrhage. Negative for mass or edema. Negative for skull fracture. Chronic bony thickening left maxillary sinus due to chronic sinusitis. No air-fluid level. CT  CERVICAL  SPINE FINDINGS Straightening of the cervical lordosis. Normal alignment. Disc degeneration and spondylosis at C5-6. Left foraminal narrowing due to spurring at C5-6. Negative for fracture IMPRESSION: No acute intracranial abnormality Cervical spondylosis.  Negative for cervical spine fracture Electronically Signed   By: Marlan Palau M.D.   On: 12/21/2015 12:17   Ct Cervical Spine Wo Contrast  12/21/2015  CLINICAL DATA:  Larey Seat and hit head.  Headache. EXAM: CT HEAD WITHOUT CONTRAST CT CERVICAL SPINE WITHOUT CONTRAST TECHNIQUE: Multidetector CT imaging of the head and cervical spine was performed following the standard protocol without intravenous contrast. Multiplanar CT image reconstructions of the cervical spine were also generated. COMPARISON:  CT 06/28/2015 FINDINGS: CT HEAD FINDINGS Ventricle size normal. No acute infarct. Negative for intracranial hemorrhage. Negative for mass or edema. Negative for skull fracture. Chronic bony thickening left maxillary sinus due to chronic sinusitis. No air-fluid level. CT CERVICAL SPINE FINDINGS Straightening of the cervical lordosis. Normal alignment. Disc degeneration and spondylosis at C5-6. Left foraminal narrowing due to spurring at C5-6. Negative for fracture IMPRESSION: No acute intracranial abnormality Cervical spondylosis.  Negative for cervical spine fracture Electronically Signed   By: Marlan Palau M.D.   On: 12/21/2015 12:17   Dg Shoulder Left  12/21/2015  CLINICAL DATA:  Larey Seat backwards today, left shoulder pain EXAM: LEFT SHOULDER - 2+ VIEW COMPARISON:  None. FINDINGS: There is no evidence of fracture or dislocation. There is no evidence of arthropathy or other focal bone abnormality. Soft tissues are unremarkable. IMPRESSION: Negative. Electronically Signed   By: Esperanza Heir M.D.   On: 12/21/2015 12:14    ASSESSMENT AND PLAN:  1. Abnormal ECG with troponin elevation: At this time, I would continue to cycle serial troponins. Can  administer daily ASA but no indication to initiation heparin at this time. No consistent exertional chest pain or dyspneic symptoms. ECG findings of nonspecific inferior T wave abnormalities were noted in 02/2015 as well. Could consider outpatient Lexiscan Cardiolite stress test as she is unable to walk on a treadmill. Metoprolol has been held due to bradycardia. Given concomitant h/o diabetes, statin therapy would be reasonable.   Signed: Prentice Docker, M.D., F.A.C.C.  12/21/2015, 5:03 PM

## 2015-12-21 NOTE — H&P (Signed)
History and Physical:    Carla KOORS   [name]:[id number] DOB: [birth year] DOA: 12/21/2015  Referring MD/provider: Samuel Jester, DO PCP: [name], MD   Chief Complaint: Fall, dizziness.  History of Present Illness:   Carla [name] is an 65 y.o. female with PMH of pseudodementia and memory loss with multiple ED visits (5 in the past 6 months) who currently presents with chief complaint of fall resulting in shoulder pain and headache. She has a history of frequent falls and reports several months of dizziness. There was no loss of consciousness, but complained of pain in the back of her head and left shoulder on presentation. She has had multiple prior evaluations for similar symptoms. Upon my exam, the patient is somnolent with slurred speech. I questioned her daughter as to what her current medications are and how many sedating medication she took today, and was told that she takes multiple chronic pain medications/muscle relaxers and other sedating medicines such as Neurontin. I mentioned to her daughter that perhaps she was using too much of these medications then stepped out of the room. When I returned to the room, the patient was fully awake and alert, and reported to me that she is under a lot of stress at home due to her other daughter's problems with the law. Daughter has multiple felonies and she mentioned that her daughter has stolen her medications in the past. Both she and the daughter that is present with her now live with this daughter.  ROS:   Review of Systems  Constitutional: Positive for malaise/fatigue. Negative for fever, chills, weight loss and diaphoresis.  Eyes: Negative.   Respiratory: Positive for shortness of breath. Negative for cough and sputum production.   Cardiovascular: Negative for chest pain and palpitations.  Gastrointestinal: Negative.   Genitourinary: Negative.   Musculoskeletal: Positive for myalgias, back pain, joint pain, falls and neck  pain.  Skin: Negative.   Neurological: Positive for dizziness, weakness and headaches.  Endo/Heme/Allergies: Negative.   Psychiatric/Behavioral: Positive for depression. The patient is nervous/anxious.      Past Medical History:   Past Medical History  Diagnosis Date  . Arthritis   . Bronchitis   . Hypertension   . Chronic back pain   . Chronic knee pain   . COPD (chronic obstructive pulmonary disease) (HCC)   . Memory difficulty 2015  . Weakness of both legs   . 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 Pain management   . Frequent falls   . Dizziness     Past Surgical History:   Past Surgical History  Procedure Laterality Date  . Knee surgery  2007  . Appendectomy  1971  . Tonsillectomy  1971    Social History:   Social History   Social History  . Marital Status: Widowed    Spouse Name: N/A  . Number of Children: 2  . Years of Education: 12   Occupational History  . Retired    Social History Main Topics  . Smoking status: Current Every Day Smoker -- 0.50 packs/day for 10 years    Types: Cigarettes  . Smokeless tobacco: Not on file     Comment: 04/30/15 less than 1 PPD  . Alcohol Use: 0.0 oz/week    0 Standard drinks or equivalent per week     Comment: Very rare  . Drug Use: No  . Sexual Activity: Not on file   Other Topics Concern  .  Not on file   Social History Narrative   Lives at home with your 2 daughters, son-in-law, 4 grandkids and more   Right and left handed   Caffeine use: very rare .Has coke 2 times a week.       Family history:   Family History  Problem Relation Age of Onset  . Heart failure Father   . Diabetes Father   . Kidney failure Father     Allergies   Bee venom; Mushroom extract complex; Amitriptyline; Toradol; Tramadol; and Trazodone and nefazodone  Current Medications:   Prior to Admission medications   Medication Sig Start Date End Date Taking?  Authorizing Provider  albuterol (PROVENTIL HFA;VENTOLIN HFA) 108 (90 BASE) MCG/ACT inhaler Inhale 2 puffs into the lungs every 6 (six) hours as needed for wheezing. 06/21/13  Yes Nimish Normajean Glasgow, MD  amLODipine (NORVASC) 2.5 MG tablet Take 2 tablets (5 mg total) by mouth daily. 06/30/15  Yes Otis Brace, MD  cyclobenzaprine (FLEXERIL) 10 MG tablet Take 10 mg by mouth 3 (three) times daily as needed for muscle spasms.   Yes Historical Provider, MD  diazepam (VALIUM) 5 MG tablet Take 5 mg by mouth 3 (three) times daily as needed for anxiety.   Yes Historical Provider, MD  diphenhydrAMINE (BENADRYL) 25 MG tablet Take 2 tablets (50 mg total) by mouth every 4 (four) hours as needed for itching. 12/17/13  Yes Wayland Salinas, MD  DULoxetine (CYMBALTA) 60 MG capsule Take 60 mg by mouth at bedtime.    Yes Historical Provider, MD  gabapentin (NEURONTIN) 300 MG capsule Take 300 mg by mouth 3 (three) times daily.   Yes Historical Provider, MD  hydrOXYzine (VISTARIL) 25 MG capsule Take 25 mg by mouth at bedtime.   Yes Historical Provider, MD  metFORMIN (GLUCOPHAGE-XR) 500 MG 24 hr tablet Take 500 mg by mouth at bedtime.  12/15/14  Yes Historical Provider, MD  metoprolol succinate (TOPROL-XL) 50 MG 24 hr tablet Take 50 mg by mouth at bedtime. 12/14/15  Yes Historical Provider, MD  omeprazole (PRILOSEC) 20 MG capsule Take 1 capsule (20 mg total) by mouth daily. 06/30/15  Yes Otis Brace, MD  oxyCODONE-acetaminophen (PERCOCET) 10-325 MG tablet Take 1 tablet by mouth every 6 (six) hours as needed for pain.   Yes Historical Provider, MD  VOLTAREN 1 % GEL Apply 4 g topically every 8 (eight) hours.  02/06/15  Yes Historical Provider, MD  EVZIO 0.4 MG/0.4ML SOAJ Inject 0.4 mLs into the muscle once as needed (for Anaphylaxis/allergic reaction).  02/19/15   Historical Provider, MD  HYDROcodone-acetaminophen (NORCO) 10-325 MG per tablet Take 1 tablet by mouth every 6 (six) hours as needed for moderate pain or severe pain. Reported on  12/21/2015    Historical Provider, MD  methylPREDNISolone (MEDROL DOSEPAK) 4 MG TBPK tablet Take 6 day taper Patient not taking: Reported on 12/21/2015 10/23/15   Eber Hong, MD  metoprolol succinate (TOPROL-XL) 25 MG 24 hr tablet Take 1 tablet (25 mg total) by mouth at bedtime. Patient not taking: Reported on 12/21/2015 06/30/15   Otis Brace, MD  oxyCODONE-acetaminophen (PERCOCET/ROXICET) 5-325 MG tablet Take 1-2 tablets by mouth every 6 (six) hours as needed. Patient not taking: Reported on 12/21/2015 07/10/15   Donnetta Hutching, MD  predniSONE (DELTASONE) 50 MG tablet 1 tablet for 6 days, one half tablet for 6 days Patient not taking: Reported on 12/21/2015 07/10/15   Donnetta Hutching, MD    Physical Exam:   Filed Vitals:   12/21/15 1318 12/21/15  1355 12/21/15 1400 12/21/15 1415  BP: 160/80 146/80 164/80   Pulse: 67 73 71 66  Temp:      TempSrc:      Resp: 19 16  17   Height:      Weight:      SpO2: 94% 100% 97% 98%     Physical Exam: Blood pressure 164/80, pulse 66, temperature 97.8 F (36.6 C), temperature source Oral, resp. rate 17, height 5\' 4"  (1.626 m), weight 102.059 kg (225 lb), SpO2 98 %. Gen: Somnolent but subsequently awakened after discussion of cutting back on her sedating medications with her daughter. Head: Normocephalic, atraumatic. Eyes: PERRL, EOMI, sclerae nonicteric. Mouth: Oropharynx reveals poor dentition and dry mucous membranes. Neck: Supple, no thyromegaly, no lymphadenopathy, no jugular venous distention. Chest: Lungs diminished at the bases but otherwise clear. CV: Heart sounds mildly bradycardic but no murmurs, rubs, or gallops. Abdomen: Soft, nontender, nondistended with normal active bowel sounds. Extremities: Extremities with trace edema bilaterally. Skin: Warm and dry. Neuro: Initially somnolent but awakened as above; grossly nonfocal. Psych: Mood and affect tearful/depressed.   Data Review:    Labs: Basic Metabolic Panel:  Recent Labs Lab  12/21/15 1210  NA 138  K 4.0  CL 101  CO2 28  GLUCOSE 106*  BUN 11  CREATININE 0.56  CALCIUM 9.0   Liver Function Tests: No results for input(s): AST, ALT, ALKPHOS, BILITOT, PROT, ALBUMIN in the last 168 hours. No results for input(s): LIPASE, AMYLASE in the last 168 hours. No results for input(s): AMMONIA in the last 168 hours. CBC:  Recent Labs Lab 12/21/15 1221  WBC 8.7  NEUTROABS 4.0  HGB 17.7*  HCT 53.3*  MCV 89.3  PLT 234   Cardiac Enzymes:  Recent Labs Lab 12/21/15 1210  TROPONINI 0.17*    Radiographic Studies: Ct Head Wo Contrast  12/21/2015  CLINICAL DATA:  Larey Seat and hit head.  Headache. EXAM: CT HEAD WITHOUT CONTRAST CT CERVICAL SPINE WITHOUT CONTRAST TECHNIQUE: Multidetector CT imaging of the head and cervical spine was performed following the standard protocol without intravenous contrast. Multiplanar CT image reconstructions of the cervical spine were also generated. COMPARISON:  CT 06/28/2015 FINDINGS: CT HEAD FINDINGS Ventricle size normal. No acute infarct. Negative for intracranial hemorrhage. Negative for mass or edema. Negative for skull fracture. Chronic bony thickening left maxillary sinus due to chronic sinusitis. No air-fluid level. CT CERVICAL SPINE FINDINGS Straightening of the cervical lordosis. Normal alignment. Disc degeneration and spondylosis at C5-6. Left foraminal narrowing due to spurring at C5-6. Negative for fracture IMPRESSION: No acute intracranial abnormality Cervical spondylosis.  Negative for cervical spine fracture Electronically Signed   By: Marlan Palau M.D.   On: 12/21/2015 12:17   Ct Cervical Spine Wo Contrast  12/21/2015  CLINICAL DATA:  Larey Seat and hit head.  Headache. EXAM: CT HEAD WITHOUT CONTRAST CT CERVICAL SPINE WITHOUT CONTRAST TECHNIQUE: Multidetector CT imaging of the head and cervical spine was performed following the standard protocol without intravenous contrast. Multiplanar CT image reconstructions of the cervical spine  were also generated. COMPARISON:  CT 06/28/2015 FINDINGS: CT HEAD FINDINGS Ventricle size normal. No acute infarct. Negative for intracranial hemorrhage. Negative for mass or edema. Negative for skull fracture. Chronic bony thickening left maxillary sinus due to chronic sinusitis. No air-fluid level. CT CERVICAL SPINE FINDINGS Straightening of the cervical lordosis. Normal alignment. Disc degeneration and spondylosis at C5-6. Left foraminal narrowing due to spurring at C5-6. Negative for fracture IMPRESSION: No acute intracranial abnormality Cervical spondylosis.  Negative for  cervical spine fracture Electronically Signed   By: Marlan Palau M.D.   On: 12/21/2015 12:17   Dg Shoulder Left  12/21/2015  CLINICAL DATA:  Larey Seat backwards today, left shoulder pain EXAM: LEFT SHOULDER - 2+ VIEW COMPARISON:  None. FINDINGS: There is no evidence of fracture or dislocation. There is no evidence of arthropathy or other focal bone abnormality. Soft tissues are unremarkable. IMPRESSION: Negative. Electronically Signed   By: Esperanza Heir M.D.   On: 12/21/2015 12:14    EKG: Independently reviewed. Borderline inferior T-wave abnormalities, unchanged from prior. Normal sinus rhythm.   Assessment/Plan:   Principal Problem:   Elevated troponin/bradycardia - No complaints of chest pain. Borderline inferior T-wave abnormalities unchanged from prior EKGs. - Prior echo done 06/29/15, EF 60%. - Monitor on telemetry. Check TSH. - Start aspirin. - Cardiology consultation.  Active Problems:   Polypharmacy - Given somnolence on admission, would hold Valium, Vistaril, Benadryl and Flexeril.    COPD (chronic obstructive pulmonary disease) (HCC) - Continue albuterol as needed for wheezing.    Essential hypertension - Hold metoprolol secondary to bradycardia. Continue Norvasc.    Spinal stenosis, lumbar region, with neurogenic claudication - Continue Neurontin.    Erythrocytosis - Chronic.    Fall - CT of the  head and neck, left shoulder films negative for fracture. - PT evaluation.    Depression/anxiety with pseudodementia - Continue Cymbalta. Hold Valium. - Suspect symptoms complicated by polypharmacy and psychosomatization.    DVT prophylaxis - Lovenox ordered.  Code Status / Family Communication / Disposition Plan:   Code Status: Full. Family Communication: Daughter at the bedside. Disposition Plan: Home 12/22/15 if cardiac workup negative.   Time spent: One hour.  Hafsa Lohn [facility] Pager [phone] Cell: [phone]   If 7PM-7AM, please contact night-coverage [email or web address] Password [email or web address] 12/21/2015, 2:26 PM

## 2015-12-21 NOTE — ED Notes (Signed)
Pt comes in for fall. Pts family states she has been complaining of dizziness for the past couple months. Pt complains of left shoulder pain, states she did hit her head, denies LOC. Pt is tearful upon triage. Pt alert and oriented at this time.    CBG 173

## 2015-12-22 DIAGNOSIS — R7989 Other specified abnormal findings of blood chemistry: Principal | ICD-10-CM

## 2015-12-22 LAB — TROPONIN I
TROPONIN I: 0.14 ng/mL — AB (ref <0.031)
TROPONIN I: 0.17 ng/mL — AB (ref <0.031)

## 2015-12-22 MED ORDER — ASPIRIN EC 81 MG PO TBEC: 81.0000 mg | DELAYED_RELEASE_TABLET | Freq: Every day | ORAL | Status: DC

## 2015-12-22 MED ORDER — MECLIZINE HCL 12.5 MG PO TABS
25.0000 mg | ORAL_TABLET | Freq: Three times a day (TID) | ORAL | Status: DC | PRN
  Administered 2015-12-22 (×2): 25 mg via ORAL
  Filled 2015-12-22 (×2): qty 2

## 2015-12-22 NOTE — Discharge Summary (Signed)
Physician Discharge Summary  Carla [name] STM:[id number] DOB: 24-Feb-1951 DOA: 12/21/2015  PCP: [name], MD  Admit date: 12/21/2015 Discharge date: 12/22/2015  Time spent: 45 minutes  Recommendations for Outpatient Follow-up:  -Will be discharged home today. -Will need follow-up in one week with cardiology for stress test. -She is advised to secure follow-up with her pain management provider to discuss alternate treatment options given her severe dizziness and lethargy throughout the day.   Discharge Diagnoses:  Principal Problem:   Elevated troponin Active Problems:   COPD (chronic obstructive pulmonary disease) (HCC)   Anxiety attack   Essential hypertension   Spinal stenosis, lumbar region, with neurogenic claudication   Erythrocytosis   Fall   Depression with pseudodementia   Discharge Condition: Stable and improved  Filed Weights   12/21/15 0940  Weight: 102.059 kg (225 lb)    History of present illness:  As per Dr. Darnelle Catalan on 3/10: Carla [name] is an 65 y.o. female with PMH of pseudodementia and memory loss with multiple ED visits (5 in the past 6 months) who currently presents with chief complaint of fall resulting in shoulder pain and headache. She has a history of frequent falls and reports several months of dizziness. There was no loss of consciousness, but complained of pain in the back of her head and left shoulder on presentation. She has had multiple prior evaluations for similar symptoms. Upon my exam, the patient is somnolent with slurred speech. I questioned her daughter as to what her current medications are and how many sedating medication she took today, and was told that she takes multiple chronic pain medications/muscle relaxers and other sedating medicines such as Neurontin. I mentioned to her daughter that perhaps she was using too much of these medications then stepped out of the room. When I returned to the room, the patient was fully awake and  alert, and reported to me that she is under a lot of stress at home due to her other daughter's problems with the law. Daughter has multiple felonies and she mentioned that her daughter has stolen her medications in the past. Both she and the daughter that is present with her now live with this daughter.  Hospital Course:   Elevated troponin -Trending downwards. -Has some inferior T-wave abnormalities on EKG that are unchanged from baseline. -Echo in September 2016 shows an ejection fraction of 60% however while motion abnormalities cannot be excluded as it was a very technically limited study. -Was seen in consultation by cardiology who is recommending daily aspirin and follow-up for consideration for an outpatient stress test.  Dizziness -Have discussed with patient that she is on multiple medications that can cause this including narcotics, Flexeril, Neurontin, Benadryl, and others. She is hesitant to discontinue any of these and like to further discuss this with her pain management doctor. Sign-she has requested a refill of her narcotics which I have refused.  Procedures:  None   Consultations:  Cardiology, Dr. Purvis Sheffield  Discharge Instructions  Discharge Instructions    Diet - low sodium heart healthy    Complete by:  As directed      Increase activity slowly    Complete by:  As directed             Medication List    STOP taking these medications        diphenhydrAMINE 25 MG tablet  Commonly known as:  BENADRYL     HYDROcodone-acetaminophen 10-325 MG tablet  Commonly known as:  NORCO     hydrOXYzine 25 MG capsule  Commonly known as:  VISTARIL     methylPREDNISolone 4 MG Tbpk tablet  Commonly known as:  MEDROL DOSEPAK     predniSONE 50 MG tablet  Commonly known as:  DELTASONE      TAKE these medications        albuterol 108 (90 Base) MCG/ACT inhaler  Commonly known as:  PROVENTIL HFA;VENTOLIN HFA  Inhale 2 puffs into the lungs every 6 (six) hours as  needed for wheezing.     amLODipine 2.5 MG tablet  Commonly known as:  NORVASC  Take 2 tablets (5 mg total) by mouth daily.     aspirin EC 81 MG tablet  Take 1 tablet (81 mg total) by mouth daily.     cyclobenzaprine 10 MG tablet  Commonly known as:  FLEXERIL  Take 10 mg by mouth 3 (three) times daily as needed for muscle spasms.     diazepam 5 MG tablet  Commonly known as:  VALIUM  Take 5 mg by mouth 3 (three) times daily as needed for anxiety.     DULoxetine 60 MG capsule  Commonly known as:  CYMBALTA  Take 60 mg by mouth at bedtime.     EVZIO 0.4 MG/0.4ML Soaj  Generic drug:  Naloxone HCl  Inject 0.4 mLs into the muscle once as needed (for Anaphylaxis/allergic reaction).     gabapentin 300 MG capsule  Commonly known as:  NEURONTIN  Take 300 mg by mouth 3 (three) times daily.     metFORMIN 500 MG 24 hr tablet  Commonly known as:  GLUCOPHAGE-XR  Take 500 mg by mouth at bedtime.     metoprolol succinate 50 MG 24 hr tablet  Commonly known as:  TOPROL-XL  Take 50 mg by mouth at bedtime.     omeprazole 20 MG capsule  Commonly known as:  PRILOSEC  Take 1 capsule (20 mg total) by mouth daily.     oxyCODONE-acetaminophen 10-325 MG tablet  Commonly known as:  PERCOCET  Take 1 tablet by mouth every 6 (six) hours as needed for pain.     VOLTAREN 1 % Gel  Generic drug:  diclofenac sodium  Apply 4 g topically every 8 (eight) hours.       Allergies  Allergen Reactions  . Bee Venom Anaphylaxis  . Mushroom Extract Complex Anaphylaxis    Extreme sweating, vomiting  . Amitriptyline Other (See Comments)    Has nightmares when using, not in right state of mind   . Toradol [Ketorolac Tromethamine] Nausea And Vomiting  . Tramadol Nausea And Vomiting  . Trazodone And Nefazodone Nausea And Vomiting       Follow-up Information    Follow up with Prentice Docker A, MD. Call in 1 week.   Specialty:  Cardiology   Why:  Call to make appointment for your stress test    Contact information:   [address] [place] [address] [phone]        The results of significant diagnostics from this hospitalization (including imaging, microbiology, ancillary and laboratory) are listed below for reference.    Significant Diagnostic Studies: Ct Head Wo Contrast  12/21/2015  CLINICAL DATA:  Larey Seat and hit head.  Headache. EXAM: CT HEAD WITHOUT CONTRAST CT CERVICAL SPINE WITHOUT CONTRAST TECHNIQUE: Multidetector CT imaging of the head and cervical spine was performed following the standard protocol without intravenous contrast. Multiplanar CT image reconstructions of the cervical spine were also generated. COMPARISON:  CT 06/28/2015 FINDINGS: CT HEAD FINDINGS Ventricle size normal. No acute infarct. Negative for intracranial hemorrhage. Negative for mass or edema. Negative for skull fracture. Chronic bony thickening left maxillary sinus due to chronic sinusitis. No air-fluid level. CT CERVICAL SPINE FINDINGS Straightening of the cervical lordosis. Normal alignment. Disc degeneration and spondylosis at C5-6. Left foraminal narrowing due to spurring at C5-6. Negative for fracture IMPRESSION: No acute intracranial abnormality Cervical spondylosis.  Negative for cervical spine fracture Electronically Signed   By: Marlan Palau M.D.   On: 12/21/2015 12:17   Ct Cervical Spine Wo Contrast  12/21/2015  CLINICAL DATA:  Larey Seat and hit head.  Headache. EXAM: CT HEAD WITHOUT CONTRAST CT CERVICAL SPINE WITHOUT CONTRAST TECHNIQUE: Multidetector CT imaging of the head and cervical spine was performed following the standard protocol without intravenous contrast. Multiplanar CT image reconstructions of the cervical spine were also generated. COMPARISON:  CT 06/28/2015 FINDINGS: CT HEAD FINDINGS Ventricle size normal. No acute infarct. Negative for intracranial hemorrhage. Negative for mass or edema. Negative for skull fracture. Chronic bony thickening left maxillary sinus due to chronic  sinusitis. No air-fluid level. CT CERVICAL SPINE FINDINGS Straightening of the cervical lordosis. Normal alignment. Disc degeneration and spondylosis at C5-6. Left foraminal narrowing due to spurring at C5-6. Negative for fracture IMPRESSION: No acute intracranial abnormality Cervical spondylosis.  Negative for cervical spine fracture Electronically Signed   By: Marlan Palau M.D.   On: 12/21/2015 12:17   Dg Shoulder Left  12/21/2015  CLINICAL DATA:  Larey Seat backwards today, left shoulder pain EXAM: LEFT SHOULDER - 2+ VIEW COMPARISON:  None. FINDINGS: There is no evidence of fracture or dislocation. There is no evidence of arthropathy or other focal bone abnormality. Soft tissues are unremarkable. IMPRESSION: Negative. Electronically Signed   By: Esperanza Heir M.D.   On: 12/21/2015 12:14    Microbiology: No results found for this or any previous visit (from the past 240 hour(s)).   Labs: Basic Metabolic Panel:  Recent Labs Lab 12/21/15 1210  NA 138  K 4.0  CL 101  CO2 28  GLUCOSE 106*  BUN 11  CREATININE 0.56  CALCIUM 9.0   Liver Function Tests: No results for input(s): AST, ALT, ALKPHOS, BILITOT, PROT, ALBUMIN in the last 168 hours. No results for input(s): LIPASE, AMYLASE in the last 168 hours. No results for input(s): AMMONIA in the last 168 hours. CBC:  Recent Labs Lab 12/21/15 1221  WBC 8.7  NEUTROABS 4.0  HGB 17.7*  HCT 53.3*  MCV 89.3  PLT 234   Cardiac Enzymes:  Recent Labs Lab 12/21/15 1210 12/21/15 1435 12/21/15 1742 12/21/15 2335 12/22/15 0606  TROPONINI 0.17* 0.24* 0.22* 0.14* 0.17*   BNP: BNP (last 3 results) No results for input(s): BNP in the last 8760 hours.  ProBNP (last 3 results) No results for input(s): PROBNP in the last 8760 hours.  CBG: No results for input(s): GLUCAP in the last 168 hours.     SignedChaya Jan  [facility] Pager: [phone] 12/22/2015, 11:44 AM

## 2015-12-22 NOTE — Care Management Obs Status (Signed)
MEDICARE OBSERVATION STATUS NOTIFICATION   Patient Details  Name: Carla [name] MRN: [id number] Date of Birth: 1951/04/12   Medicare Observation Status Notification Given:  Yes    Fuller Plan, RN 12/22/2015, 11:47 AM

## 2015-12-22 NOTE — Progress Notes (Signed)
Patient states understanding of discharge, prescription given 

## 2015-12-23 ENCOUNTER — Emergency Department (HOSPITAL_COMMUNITY)
Admission: EM | Admit: 2015-12-23 | Discharge: 2015-12-24 | Disposition: A | Discharge status: Home / Self Care | Payer: Medicare Other | Attending: Emergency Medicine | Admitting: Emergency Medicine

## 2015-12-23 ENCOUNTER — Emergency Department (HOSPITAL_COMMUNITY): Payer: Medicare Other

## 2015-12-23 ENCOUNTER — Encounter (HOSPITAL_COMMUNITY): Payer: Self-pay | Admitting: *Deleted

## 2015-12-23 DIAGNOSIS — M545 Low back pain: Secondary | ICD-10-CM | POA: Insufficient documentation

## 2015-12-23 DIAGNOSIS — R55 Syncope and collapse: Secondary | ICD-10-CM | POA: Diagnosis not present

## 2015-12-23 DIAGNOSIS — J449 Chronic obstructive pulmonary disease, unspecified: Secondary | ICD-10-CM | POA: Insufficient documentation

## 2015-12-23 DIAGNOSIS — Y999 Unspecified external cause status: Secondary | ICD-10-CM | POA: Diagnosis not present

## 2015-12-23 DIAGNOSIS — Z79899 Other long term (current) drug therapy: Secondary | ICD-10-CM | POA: Diagnosis not present

## 2015-12-23 DIAGNOSIS — W1839XA Other fall on same level, initial encounter: Secondary | ICD-10-CM | POA: Insufficient documentation

## 2015-12-23 DIAGNOSIS — Y9389 Activity, other specified: Secondary | ICD-10-CM | POA: Insufficient documentation

## 2015-12-23 DIAGNOSIS — M25561 Pain in right knee: Secondary | ICD-10-CM | POA: Insufficient documentation

## 2015-12-23 DIAGNOSIS — M542 Cervicalgia: Secondary | ICD-10-CM | POA: Diagnosis not present

## 2015-12-23 DIAGNOSIS — M25562 Pain in left knee: Secondary | ICD-10-CM | POA: Insufficient documentation

## 2015-12-23 DIAGNOSIS — I10 Essential (primary) hypertension: Secondary | ICD-10-CM | POA: Diagnosis not present

## 2015-12-23 DIAGNOSIS — M25512 Pain in left shoulder: Secondary | ICD-10-CM

## 2015-12-23 DIAGNOSIS — R51 Headache: Secondary | ICD-10-CM | POA: Diagnosis not present

## 2015-12-23 DIAGNOSIS — S4992XA Unspecified injury of left shoulder and upper arm, initial encounter: Secondary | ICD-10-CM | POA: Diagnosis not present

## 2015-12-23 DIAGNOSIS — F17210 Nicotine dependence, cigarettes, uncomplicated: Secondary | ICD-10-CM | POA: Diagnosis not present

## 2015-12-23 DIAGNOSIS — Z7984 Long term (current) use of oral hypoglycemic drugs: Secondary | ICD-10-CM | POA: Diagnosis not present

## 2015-12-23 DIAGNOSIS — W19XXXA Unspecified fall, initial encounter: Secondary | ICD-10-CM

## 2015-12-23 DIAGNOSIS — Y92009 Unspecified place in unspecified non-institutional (private) residence as the place of occurrence of the external cause: Secondary | ICD-10-CM | POA: Diagnosis not present

## 2015-12-23 LAB — URINALYSIS, ROUTINE W REFLEX MICROSCOPIC
Bilirubin Urine: NEGATIVE
GLUCOSE, UA: NEGATIVE mg/dL
Ketones, ur: NEGATIVE mg/dL
LEUKOCYTES UA: NEGATIVE
NITRITE: NEGATIVE
PROTEIN: NEGATIVE mg/dL
Specific Gravity, Urine: 1.01 (ref 1.005–1.030)
pH: 6 (ref 5.0–8.0)

## 2015-12-23 LAB — RAPID URINE DRUG SCREEN, HOSP PERFORMED
AMPHETAMINES: NOT DETECTED
BENZODIAZEPINES: POSITIVE — AB
Barbiturates: NOT DETECTED
COCAINE: NOT DETECTED
OPIATES: NOT DETECTED
Tetrahydrocannabinol: NOT DETECTED

## 2015-12-23 LAB — URINE MICROSCOPIC-ADD ON

## 2015-12-23 MED ORDER — ACETAMINOPHEN 500 MG PO TABS
1000.0000 mg | ORAL_TABLET | Freq: Once | ORAL | Status: AC
  Administered 2015-12-24: 1000 mg via ORAL
  Filled 2015-12-23: qty 2

## 2015-12-23 NOTE — ED Provider Notes (Signed)
CSN: [id number]     Arrival date & time 12/23/15  2028 History  By signing my name below, I, Marisue Humble, attest that this documentation has been prepared under the direction and in the presence of Lavera Guise, MD . Electronically Signed: Marisue Humble, Scribe. 12/23/2015. 9:05 PM.   Chief Complaint  Patient presents with  . Fall   The history is provided by the patient. No language interpreter was used.   HPI Comments:  Carla [name] is a 65 y.o. female with PMHx of HTN, COPD, and frequent falls who presents to the Emergency Department via EMS s/p multiple falls earlier tonight complaining of moderate pain in head, neck, low back, and left shoulder. She states she fell while doing housework, landing on the floor and hitting her head on the counter. States that she had suddenly turned while cleaning and missed her walker that she was trying to grab. Later that evening the pt fell a second time while getting ready for bed, tripping, hitting her head again. She reports possible LOC lasting a few seconds after the second fall. Pt reports associated worsening balance difficulty for the past month and painful knee swelling. She was discharged from the hospital yesterday. Pt denies nausea or vomiting.  Past Medical History  Diagnosis Date  . Arthritis   . Bronchitis   . Hypertension   . Chronic back pain   . Chronic knee pain   . COPD (chronic obstructive pulmonary disease) (HCC)   . Memory difficulty 2015  . Weakness of both legs   . 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 Pain management   . Frequent falls   . Dizziness    Past Surgical History  Procedure Laterality Date  . Knee surgery  2007  . Appendectomy  1971  . Tonsillectomy  1971   Family History  Problem Relation Age of Onset  . Heart failure Father   . Diabetes Father   . Kidney failure Father    Social History  Substance Use Topics  . Smoking  status: Current Every Day Smoker -- 0.50 packs/day for 10 years    Types: Cigarettes  . Smokeless tobacco: None     Comment: 04/30/15 less than 1 PPD  . Alcohol Use: 0.0 oz/week    0 Standard drinks or equivalent per week     Comment: Very rare   OB History    Gravida Para Term Preterm AB TAB SAB Ectopic Multiple Living   2 2 2             Review of Systems  Gastrointestinal: Negative for nausea and vomiting.  Musculoskeletal: Positive for back pain, joint swelling, arthralgias and neck pain.  Neurological: Positive for syncope and headaches.  All other systems reviewed and are negative.   Allergies  Bee venom; Mushroom extract complex; Amitriptyline; Toradol; Tramadol; and Trazodone and nefazodone  Home Medications   Prior to Admission medications   Medication Sig Start Date End Date Taking? Authorizing Provider  albuterol (PROVENTIL HFA;VENTOLIN HFA) 108 (90 BASE) MCG/ACT inhaler Inhale 2 puffs into the lungs every 6 (six) hours as needed for wheezing. 06/21/13  Yes Nimish Normajean Glasgow, MD  amLODipine (NORVASC) 2.5 MG tablet Take 2 tablets (5 mg total) by mouth daily. 06/30/15  Yes Marjan Rabbani, MD  aspirin EC 81 MG tablet Take 1 tablet (81 mg total) by mouth daily. 12/22/15  Yes Estela Isaiah Blakes, MD  cyclobenzaprine (FLEXERIL) 10  MG tablet Take 10 mg by mouth 3 (three) times daily as needed for muscle spasms.   Yes Historical Provider, MD  diazepam (VALIUM) 5 MG tablet Take 5 mg by mouth 3 (three) times daily as needed for anxiety.   Yes Historical Provider, MD  DULoxetine (CYMBALTA) 60 MG capsule Take 60 mg by mouth at bedtime.    Yes Historical Provider, MD  gabapentin (NEURONTIN) 300 MG capsule Take 300 mg by mouth 3 (three) times daily.   Yes Historical Provider, MD  metFORMIN (GLUCOPHAGE-XR) 500 MG 24 hr tablet Take 500 mg by mouth at bedtime.  12/15/14  Yes Historical Provider, MD  metoprolol succinate (TOPROL-XL) 50 MG 24 hr tablet Take 50 mg by mouth at bedtime. 12/14/15   Yes Historical Provider, MD  omeprazole (PRILOSEC) 20 MG capsule Take 1 capsule (20 mg total) by mouth daily. 06/30/15  Yes Otis Brace, MD  oxyCODONE-acetaminophen (PERCOCET) 10-325 MG tablet Take 1 tablet by mouth every 6 (six) hours as needed for pain.   Yes Historical Provider, MD  VOLTAREN 1 % GEL Apply 4 g topically every 8 (eight) hours.  02/06/15  Yes Historical Provider, MD  EVZIO 0.4 MG/0.4ML SOAJ Inject 0.4 mLs into the muscle once as needed (for Anaphylaxis/allergic reaction).  02/19/15   Historical Provider, MD   BP 160/45 mmHg  Pulse 72  Temp(Src) 98 F (36.7 C) (Oral)  Resp 20  Ht 5\' 4"  (1.626 m)  Wt 225 lb (102.059 kg)  BMI 38.60 kg/m2  SpO2 96% Physical Exam Physical Exam  Nursing note and vitals reviewed. Constitutional: Well developed, well nourished, non-toxic, and in no acute distress Head: Normocephalic and atraumatic.  Mouth/Throat: Oropharynx is clear and moist.  Neck: Normal range of motion. Neck supple.  Cardiovascular: Normal rate and regular rhythm.   Pulmonary/Chest: Effort normal and breath sounds normal.  Abdominal: Soft. There is no tenderness. There is no rebound and no guarding.  Musculoskeletal: Normal range of motion.  Neurological: Alert, no facial droop, fluent speech, moves all extremities symmetrically. PERRL Skin: Skin is warm and dry.  Psychiatric: Cooperative   ED Course  Procedures  DIAGNOSTIC STUDIES:  Oxygen Saturation is 97% on RA, normal by my interpretation.    COORDINATION OF CARE:  8:59 PM Will order imaging studies. Discussed treatment plan with pt at bedside and pt agreed to plan.  Labs Review Labs Reviewed  URINALYSIS, ROUTINE W REFLEX MICROSCOPIC (NOT AT [facility]) - Abnormal; Notable for the following:    Hgb urine dipstick TRACE (*)    All other components within normal limits  URINE RAPID DRUG SCREEN, HOSP PERFORMED - Abnormal; Notable for the following:    Benzodiazepines POSITIVE (*)    All other components within  normal limits  URINE MICROSCOPIC-ADD ON - Abnormal; Notable for the following:    Squamous Epithelial / LPF 0-5 (*)    Bacteria, UA RARE (*)    All other components within normal limits    Imaging Review Dg Lumbar Spine Complete  12/23/2015  CLINICAL DATA:  Fall with back pain.  Initial encounter. EXAM: LUMBAR SPINE - COMPLETE 4+ VIEW COMPARISON:  10/23/2015 MRI. FINDINGS: Small ribs at T12.  Normal segmentation. No visible fracture.  No traumatic malalignment. Diffuse degenerative disc narrowing and endplate spurring. Facet arthropathy, greatest inferiorly and at L4-5 where there is minimal anterolisthesis. Atherosclerosis. IMPRESSION: 1. No acute finding. 2. Diffuse degenerative change with notable L4-5 facet arthropathy and mild anterolisthesis. Electronically Signed   By: Kathrynn Ducking.D.  On: 12/23/2015 23:26   Ct Head Wo Contrast  12/23/2015  CLINICAL DATA:  Two falls today, with loss of consciousness and headache. History of frequent falls and pseudo dementia. EXAM: CT HEAD WITHOUT CONTRAST CT CERVICAL SPINE WITHOUT CONTRAST TECHNIQUE: Multidetector CT imaging of the head and cervical spine was performed following the standard protocol without intravenous contrast. Multiplanar CT image reconstructions of the cervical spine were also generated. COMPARISON:  CT head and cervical spine December 21, 2015 FINDINGS: CT HEAD FINDINGS The ventricles and sulci are normal for age. No intraparenchymal hemorrhage, mass effect nor midline shift. Patchy supratentorial white matter hypodensities are similar. No acute large vascular territory infarcts. No abnormal extra-axial fluid collections. Basal cisterns are patent. Mild calcific atherosclerosis of the carotid siphons. No skull fracture. The included ocular globes and orbital contents are non-suspicious. Mild paranasal sinus mucosal thickening with frothy secretions. LEFT maxillary sinus wall thickening compatible with chronic sinusitis. Minimal chronic  RIGHT mastoid effusion. CT CERVICAL SPINE FINDINGS Cervical vertebral bodies and posterior elements are intact and aligned with maintenance of the cervical lordosis. Severe C5-6 disc height loss, uncovertebral hypertrophy resulting in mild canal stenosis. Severe LEFT, moderate RIGHT neural foraminal narrowing at C5-6. No destructive bony lesions. C1-2 articulation maintained. Included prevertebral and paraspinal soft tissues are unremarkable. IMPRESSION: CT HEAD: No acute intracranial process ; stable CT head from December 21, 2015. Acute on chronic mild paranasal sinusitis. CT CERVICAL SPINE: No acute cervical spine fracture or malalignment. Severe LEFT C5-6 neural foraminal narrowing. Electronically Signed   By: Awilda Metro M.D.   On: 12/23/2015 22:57   Ct Cervical Spine Wo Contrast  12/23/2015  CLINICAL DATA:  Two falls today, with loss of consciousness and headache. History of frequent falls and pseudo dementia. EXAM: CT HEAD WITHOUT CONTRAST CT CERVICAL SPINE WITHOUT CONTRAST TECHNIQUE: Multidetector CT imaging of the head and cervical spine was performed following the standard protocol without intravenous contrast. Multiplanar CT image reconstructions of the cervical spine were also generated. COMPARISON:  CT head and cervical spine December 21, 2015 FINDINGS: CT HEAD FINDINGS The ventricles and sulci are normal for age. No intraparenchymal hemorrhage, mass effect nor midline shift. Patchy supratentorial white matter hypodensities are similar. No acute large vascular territory infarcts. No abnormal extra-axial fluid collections. Basal cisterns are patent. Mild calcific atherosclerosis of the carotid siphons. No skull fracture. The included ocular globes and orbital contents are non-suspicious. Mild paranasal sinus mucosal thickening with frothy secretions. LEFT maxillary sinus wall thickening compatible with chronic sinusitis. Minimal chronic RIGHT mastoid effusion. CT CERVICAL SPINE FINDINGS Cervical  vertebral bodies and posterior elements are intact and aligned with maintenance of the cervical lordosis. Severe C5-6 disc height loss, uncovertebral hypertrophy resulting in mild canal stenosis. Severe LEFT, moderate RIGHT neural foraminal narrowing at C5-6. No destructive bony lesions. C1-2 articulation maintained. Included prevertebral and paraspinal soft tissues are unremarkable. IMPRESSION: CT HEAD: No acute intracranial process ; stable CT head from December 21, 2015. Acute on chronic mild paranasal sinusitis. CT CERVICAL SPINE: No acute cervical spine fracture or malalignment. Severe LEFT C5-6 neural foraminal narrowing. Electronically Signed   By: Awilda Metro M.D.   On: 12/23/2015 22:57   Dg Shoulder Left  12/23/2015  CLINICAL DATA:  Fall with left shoulder pain.  Initial encounter. EXAM: LEFT SHOULDER - 2 VIEW COMPARISON:  12/21/2015 FINDINGS: There is no evidence of fracture or dislocation. No visible rib fracture. Mild upward AC joint spurring. IMPRESSION: Negative. Electronically Signed   By: Kathrynn Ducking.D.  On: 12/23/2015 23:27   Dg Knee Complete 4 Views Left  12/23/2015  CLINICAL DATA:  Fall with bilateral knee pain.  Initial encounter. EXAM: LEFT KNEE - COMPLETE 4+ VIEW COMPARISON:  06/04/2015 FINDINGS: There is no evidence of fracture, dislocation, or joint effusion. Mild degenerative marginal spurring without detectable joint narrowing. Mild chondrocalcinosis. IMPRESSION: 1. No acute finding. 2. Mild osteoarthritis. Electronically Signed   By: Marnee Spring M.D.   On: 12/23/2015 23:22   Dg Knee Complete 4 Views Right  12/23/2015  CLINICAL DATA:  Fall with bilateral knee pain.  Initial encounter. EXAM: RIGHT KNEE - COMPLETE 4+ VIEW COMPARISON:  None. FINDINGS: There is no evidence of fracture, dislocation, or joint effusion. Tricompartmental osteoarthritis with diffuse joint narrowing and spurring. Osteopenia. IMPRESSION: 1. No acute finding. 2. Tricompartmental osteoarthritis.  Electronically Signed   By: Marnee Spring M.D.   On: 12/23/2015 23:24   I have personally reviewed and evaluated these images and lab results as part of my medical decision-making.   EKG Interpretation None      MDM   Final diagnoses:  Fall, initial encounter  Knee pain, bilateral  Left shoulder pain    65 year old female with history of chronic pain syndrome, pseudodementia, frequent falls , and memory loss who presents after 2 mechanical falls today. On presentation with no evidence of severe injuries. Is in a cervical collar. Her vital signs are stable. Complains of pain everywhere, but primarily localizes left shoulder and bilateral knees. CT of her head and cervical spine are negative for acute traumatic injuries. Cervical collar was cleared. X-rays of her knees, shoulder, low back does not show acute traumatic injuries. She does have some significant arthritis that is likely causing a lot of her pain. On chart review, she was just discharged from the hospital yesterday after evaluation of frequent episodes of dizziness and frequent falls. It was felt that these are also due to polypharmacy where she is on a lot of narcotic medications, benzodiazepines, and other sedating medications. I have requesting narcotic pain medications here in the emergency department, I discussed with her that given no severe injury she will not be provided any. She was able to ambulate with walker here. Appropriate for discharge home. Strict return and follow-up instructions reviewed. She expressed understanding of all discharge instructions and felt comfortable with the plan of care.     I personally performed the services described in this documentation, which was scribed in my presence. The recorded information has been reviewed and is accurate.    Lavera Guise, MD 12/24/15 [phone]

## 2015-12-23 NOTE — ED Notes (Signed)
Pt unsteady with ambulation.  States it's because of the pain in her knees and "no of ya'll care what it's doing to me".  Explained to pt that Dr Verdie Mosher did not want to give narcotic pain medication due to pts weakness and unsteady gait.  Pt crying and angry about not getting medication.  Informed Dr Verdie Mosher.

## 2015-12-23 NOTE — ED Notes (Signed)
Pt arrived to er by [facility] with c/o multiple falls today, pt has multiple areas of pain, arrives to er in c-collar,

## 2015-12-24 NOTE — ED Notes (Signed)
Pt states understanding of care given and follow up instructions.  Pt unhappy about not getting anything other than tylenol for pain.  States she will take her pain medication when she gets home.  Informed pt she does not need to take narcotics due to being weak and unsteady on feet.  Pt called daughter for ride home and insisted on being taken to waiting room to wait for ride.

## 2015-12-24 NOTE — ED Notes (Signed)
Dr. Verdie Mosher spoke with pt.  Pt willing to take tylenol after discussing treatment with Dr. Rock Nephew to be discharged to daughter

## 2015-12-24 NOTE — Discharge Instructions (Signed)
Your x-rays show arthritis of your joints but you do not have any broken bones. Your CTs do not show serious head or neck injury.   Please call your primary care physician regarding further management of your pain.   Joint Pain Joint pain, which is also called arthralgia, can be caused by many things. Joint pain often goes away when you follow your health care provider's instructions for relieving pain at home. However, joint pain can also be caused by conditions that require further treatment. Common causes of joint pain include:  Bruising in the area of the joint.  Overuse of the joint.  Wear and tear on the joints that occur with aging (osteoarthritis).  Various other forms of arthritis.  A buildup of a crystal form of uric acid in the joint (gout).  Infections of the joint (septic arthritis) or of the bone (osteomyelitis). Your health care provider may recommend medicine to help with the pain. If your joint pain continues, additional tests may be needed to diagnose your condition. HOME CARE INSTRUCTIONS Watch your condition for any changes. Follow these instructions as directed to lessen the pain that you are feeling.  Take medicines only as directed by your health care provider.  Rest the affected area for as long as your health care provider says that you should. If directed to do so, raise the painful joint above the level of your heart while you are sitting or lying down.  Do not do things that cause or worsen pain.  If directed, apply ice to the painful area:  Put ice in a plastic bag.  Place a towel between your skin and the bag.  Leave the ice on for 20 minutes, 2-3 times per day.  Wear an elastic bandage, splint, or sling as directed by your health care provider. Loosen the elastic bandage or splint if your fingers or toes become numb and tingle, or if they turn cold and blue.  Begin exercising or stretching the affected area as directed by your health care  provider. Ask your health care provider what types of exercise are safe for you.  Keep all follow-up visits as directed by your health care provider. This is important. SEEK MEDICAL CARE IF:  Your pain increases, and medicine does not help.  Your joint pain does not improve within 3 days.  You have increased bruising or swelling.  You have a fever.  You lose 10 lb (4.5 kg) or more without trying. SEEK IMMEDIATE MEDICAL CARE IF:  You are not able to move the joint.  Your fingers or toes become numb or they turn cold and blue.   This information is not intended to replace advice given to you by your health care provider. Make sure you discuss any questions you have with your health care provider.   Document Released: 09/29/2005 Document Revised: 10/20/2014 Document Reviewed: 07/11/2014 Elsevier Interactive Patient Education [employer]

## 2015-12-28 ENCOUNTER — Encounter: Payer: Self-pay | Admitting: *Deleted

## 2015-12-28 ENCOUNTER — Encounter: Payer: Self-pay | Admitting: Physician Assistant

## 2015-12-28 ENCOUNTER — Ambulatory Visit (INDEPENDENT_AMBULATORY_CARE_PROVIDER_SITE_OTHER): Payer: Medicare Other | Admitting: Physician Assistant

## 2015-12-28 VITALS — BP 128/78 | HR 64 | Ht 64.0 in | Wt 224.0 lb

## 2015-12-28 DIAGNOSIS — R7989 Other specified abnormal findings of blood chemistry: Secondary | ICD-10-CM

## 2015-12-28 DIAGNOSIS — R778 Other specified abnormalities of plasma proteins: Secondary | ICD-10-CM

## 2015-12-28 NOTE — Progress Notes (Signed)
Cardiology Office Note   Date:  12/28/2015   ID:  Carla [name], DOB [birth year], MRN [id number]  PCP:  [name], MD  Cardiologist:  Dr Gwenyth Bender, PA-C   No chief complaint on file.   History of Present Illness: Carla [name] is a 65 y.o. female with a history of chronic pain syndrome, pseudodementia, and memory loss.  Seen by Dr Purvis Sheffield 03/10 when admitted to [facility] for a fall and had elevated troponin to 0.24. ECG non-specific, EF 60% by echo 06/2015.  Carla [name] presents for post-hospital followup. Pt has not had chest pain or palpitations. She has multiple other areas of MS pain and is followed by the pain clinic. She has significant balance problems and has fallen twice since d/c 03/11. She went to the ER for one of the falls, on 03/12.   She feels that her balance is worsening and her pain is not controlled. These are her main issues, but wants full workup done if there might be anything wrong with her heart.   Past Medical History  Diagnosis Date  . Arthritis   . Bronchitis   . Hypertension   . Chronic back pain   . Chronic knee pain   . COPD (chronic obstructive pulmonary disease) (HCC)   . Memory difficulty 2015  . Weakness of both legs   . 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 Pain management   . Frequent falls   . Dizziness     Past Surgical History  Procedure Laterality Date  . Knee cartilage surgery Right 2007  . Appendectomy  1971  . Tonsillectomy  1971    Current Outpatient Prescriptions  Medication Sig Dispense Refill  . albuterol (PROVENTIL HFA;VENTOLIN HFA) 108 (90 BASE) MCG/ACT inhaler Inhale 2 puffs into the lungs every 6 (six) hours as needed for wheezing. 1 Inhaler 2  . amLODipine (NORVASC) 2.5 MG tablet Take 2 tablets (5 mg total) by mouth daily.    Marland Kitchen aspirin EC 81 MG tablet Take 1 tablet (81 mg total) by mouth daily.    . cyclobenzaprine  (FLEXERIL) 10 MG tablet Take 10 mg by mouth 3 (three) times daily as needed for muscle spasms.    . diazepam (VALIUM) 5 MG tablet Take 5 mg by mouth 3 (three) times daily as needed for anxiety.    . DULoxetine (CYMBALTA) 60 MG capsule Take 60 mg by mouth at bedtime.     Marland Kitchen EVZIO 0.4 MG/0.4ML SOAJ Inject 0.4 mLs into the muscle once as needed (for Anaphylaxis/allergic reaction).     . gabapentin (NEURONTIN) 300 MG capsule Take 300 mg by mouth 3 (three) times daily.    . metFORMIN (GLUCOPHAGE-XR) 500 MG 24 hr tablet Take 500 mg by mouth at bedtime.     . metoprolol succinate (TOPROL-XL) 50 MG 24 hr tablet Take 50 mg by mouth at bedtime.    Marland Kitchen omeprazole (PRILOSEC) 20 MG capsule Take 1 capsule (20 mg total) by mouth daily. 30 capsule 0  . oxyCODONE-acetaminophen (PERCOCET) 10-325 MG tablet Take 1 tablet by mouth every 6 (six) hours as needed for pain.    Marland Kitchen VOLTAREN 1 % GEL Apply 4 g topically every 8 (eight) hours.      No current facility-administered medications for this visit.    Allergies:   Bee venom; Mushroom extract complex; Amitriptyline; Toradol; Tramadol; and Trazodone and nefazodone    Social History:  The  patient  reports that she has been smoking Cigarettes.  She has a 5 pack-year smoking history. She does not have any smokeless tobacco history on file. She reports that she drinks alcohol. She reports that she does not use illicit drugs.   Family History:  The patient's family history includes Diabetes in her father; Heart failure in her father; Kidney failure in her father.    ROS:  Please see the history of present illness. All other systems are reviewed and negative.    PHYSICAL EXAM: VS:  BP 128/78 mmHg  Pulse 64  Ht 5\' 4"  (1.626 m)  Wt 224 lb (101.606 kg)  BMI 38.43 kg/m2  SpO2 97% , BMI Body mass index is 38.43 kg/(m^2). GEN: Well nourished, well developed, female in no acute distress HEENT: normal for age  Neck: no JVD, no carotid bruit, no masses Cardiac: RRR; soft  murmur, no rubs, or gallops Respiratory:  clear to auscultation bilaterally, normal work of breathing GI: soft, nontender, nondistended, + BS MS: no deformity or atrophy; no edema; distal pulses are 2+ in all 4 extremities  Skin: warm and dry, no rash Neuro:  Strength and sensation are intact Psych: euthymic mood, full affect   EKG:  EKG is not ordered today.  Recent Labs: 06/28/2015: ALT 25 06/29/2015: Magnesium 1.8 12/21/2015: BUN 11; Creatinine, Ser 0.56; Hemoglobin 17.7*; Platelets 234; Potassium 4.0; Sodium 138; TSH 2.655    Lipid Panel No results found for: CHOL, TRIG, HDL, CHOLHDL, VLDL, LDLCALC, LDLDIRECT   Wt Readings from Last 3 Encounters:  12/28/15 224 lb (101.606 kg)  12/23/15 225 lb (102.059 kg)  12/21/15 225 lb (102.059 kg)     Other studies Reviewed: Additional studies/ records that were reviewed today include: Hospital records and consult note.  ASSESSMENT AND PLAN:  1.  Elevated troponin: pt not having ongoing ischemic symptoms. Multiple CRFs including obesity, HTN, FH premature CAD.   Will order Lexiscan MV. F/u with Dr Purvis Sheffield after the test. She is on a BB. Add ASA and statin if the MV is abnl.  Current medicines are reviewed at length with the patient today.  The patient does not have concerns regarding medicines.  The following changes have been made:  no change  Labs/ tests ordered today include:   Orders Placed This Encounter  Procedures  . NM Myocar Multi W/Spect W/Wall Motion / EF     Disposition:   FU with Dr Purvis Sheffield Signed, Theodore Demark, PA-C  12/28/2015 4:02 PM    [facility] Phone: [phone]; Fax: [phone]  This note was written with the assistance of speech recognition software. Please excuse any transcriptional errors.

## 2015-12-28 NOTE — Patient Instructions (Signed)
Your physician recommends that you schedule a follow-up appointment with Dr. Purvis Sheffield after stress test.   Your physician recommends that you continue on your current medications as directed. Please refer to the Current Medication list given to you today.  Your physician has requested that you have a lexiscan myoview. For further information please visit [email or web address] Please follow instruction sheet, as given.  Do not take Metformin on the day of your Stress Test  If you need a refill on your cardiac medications before your next appointment, please call your pharmacy.  Thank you for choosing [facility]!

## 2016-01-01 ENCOUNTER — Encounter (HOSPITAL_COMMUNITY): Payer: Self-pay

## 2016-01-01 ENCOUNTER — Emergency Department (HOSPITAL_COMMUNITY): Payer: Medicare Other

## 2016-01-01 ENCOUNTER — Emergency Department (HOSPITAL_COMMUNITY)
Admission: EM | Admit: 2016-01-01 | Discharge: 2016-01-01 | Disposition: A | Discharge status: Home / Self Care | Payer: Medicare Other | Attending: Emergency Medicine | Admitting: Emergency Medicine

## 2016-01-01 DIAGNOSIS — R079 Chest pain, unspecified: Secondary | ICD-10-CM | POA: Insufficient documentation

## 2016-01-01 DIAGNOSIS — Z79891 Long term (current) use of opiate analgesic: Secondary | ICD-10-CM | POA: Insufficient documentation

## 2016-01-01 DIAGNOSIS — Z79899 Other long term (current) drug therapy: Secondary | ICD-10-CM | POA: Insufficient documentation

## 2016-01-01 DIAGNOSIS — F17210 Nicotine dependence, cigarettes, uncomplicated: Secondary | ICD-10-CM | POA: Diagnosis not present

## 2016-01-01 DIAGNOSIS — I10 Essential (primary) hypertension: Secondary | ICD-10-CM | POA: Diagnosis not present

## 2016-01-01 DIAGNOSIS — M25562 Pain in left knee: Secondary | ICD-10-CM | POA: Insufficient documentation

## 2016-01-01 DIAGNOSIS — J449 Chronic obstructive pulmonary disease, unspecified: Secondary | ICD-10-CM | POA: Diagnosis not present

## 2016-01-01 DIAGNOSIS — Z7984 Long term (current) use of oral hypoglycemic drugs: Secondary | ICD-10-CM | POA: Diagnosis not present

## 2016-01-01 LAB — PROTIME-INR
INR: 0.92 (ref 0.00–1.49)
PROTHROMBIN TIME: 12.6 s (ref 11.6–15.2)

## 2016-01-01 LAB — CBC
HEMATOCRIT: 51.8 % — AB (ref 36.0–46.0)
Hemoglobin: 17.2 g/dL — ABNORMAL HIGH (ref 12.0–15.0)
MCH: 30 pg (ref 26.0–34.0)
MCHC: 33.2 g/dL (ref 30.0–36.0)
MCV: 90.2 fL (ref 78.0–100.0)
Platelets: 246 10*3/uL (ref 150–400)
RBC: 5.74 MIL/uL — ABNORMAL HIGH (ref 3.87–5.11)
RDW: 14.1 % (ref 11.5–15.5)
WBC: 11.7 10*3/uL — ABNORMAL HIGH (ref 4.0–10.5)

## 2016-01-01 LAB — COMPREHENSIVE METABOLIC PANEL
ALT: 20 U/L (ref 14–54)
AST: 26 U/L (ref 15–41)
Albumin: 4 g/dL (ref 3.5–5.0)
Alkaline Phosphatase: 105 U/L (ref 38–126)
Anion gap: 7 (ref 5–15)
BILIRUBIN TOTAL: 0.6 mg/dL (ref 0.3–1.2)
BUN: 14 mg/dL (ref 6–20)
CO2: 29 mmol/L (ref 22–32)
CREATININE: 0.6 mg/dL (ref 0.44–1.00)
Calcium: 8.9 mg/dL (ref 8.9–10.3)
Chloride: 101 mmol/L (ref 101–111)
Glucose, Bld: 133 mg/dL — ABNORMAL HIGH (ref 65–99)
POTASSIUM: 4.2 mmol/L (ref 3.5–5.1)
Sodium: 137 mmol/L (ref 135–145)
TOTAL PROTEIN: 7.4 g/dL (ref 6.5–8.1)

## 2016-01-01 LAB — I-STAT TROPONIN, ED
Troponin i, poc: 0 ng/mL (ref 0.00–0.08)
Troponin i, poc: 0.01 ng/mL (ref 0.00–0.08)

## 2016-01-01 LAB — APTT: aPTT: 29 seconds (ref 24–37)

## 2016-01-01 LAB — D-DIMER, QUANTITATIVE: D-Dimer, Quant: 0.45 ug/mL-FEU (ref 0.00–0.50)

## 2016-01-01 MED ORDER — SODIUM CHLORIDE 0.9 % IV SOLN
INTRAVENOUS | Status: DC
  Administered 2016-01-01: 15:00:00 via INTRAVENOUS

## 2016-01-01 MED ORDER — MORPHINE SULFATE (PF) 4 MG/ML IV SOLN
4.0000 mg | Freq: Once | INTRAVENOUS | Status: AC
  Administered 2016-01-01: 4 mg via INTRAVENOUS
  Filled 2016-01-01: qty 1

## 2016-01-01 MED ORDER — ASPIRIN 81 MG PO CHEW
324.0000 mg | CHEWABLE_TABLET | Freq: Once | ORAL | Status: AC
  Administered 2016-01-01: 324 mg via ORAL
  Filled 2016-01-01: qty 4

## 2016-01-01 MED ORDER — ONDANSETRON HCL 4 MG/2ML IJ SOLN
4.0000 mg | Freq: Once | INTRAMUSCULAR | Status: AC
  Administered 2016-01-01: 4 mg via INTRAVENOUS
  Filled 2016-01-01: qty 2

## 2016-01-01 NOTE — ED Notes (Signed)
Pt complain of chest pain and pain in left leg. Pt states she was have PT in [place] and her chest started about 30 minutes later

## 2016-01-01 NOTE — Discharge Instructions (Signed)
Knee Pain Knee pain is a common problem. It can have many causes. The pain often goes away by following your doctor's home care instructions. Treatment for ongoing pain will depend on the cause of your pain. If your knee pain continues, more tests may be needed to diagnose your condition. Tests may include X-rays or other imaging studies of your knee. HOME CARE  Take medicines only as told by your doctor.  Rest your knee and keep it raised (elevated) while you are resting.  Do not do things that cause pain or make your pain worse.  Avoid activities where both feet leave the ground at the same time, such as running, jumping rope, or doing jumping jacks.  Apply ice to the knee area:  Put ice in a plastic bag.  Place a towel between your skin and the bag.  Leave the ice on for 20 minutes, 2-3 times a day.  Ask your doctor if you should wear an elastic knee support.  Sleep with a pillow under your knee.  Lose weight if you are overweight. Being overweight can make your knee hurt more.  Do not use any tobacco products, including cigarettes, chewing tobacco, or electronic cigarettes. If you need help quitting, ask your doctor. Smoking may slow the healing of any bone and joint problems that you may have. GET HELP IF:  Your knee pain does not stop, it changes, or it gets worse.  You have a fever along with knee pain.  Your knee gives out or locks up.  Your knee becomes more swollen. GET HELP RIGHT AWAY IF:   Your knee feels hot to the touch.  You have chest pain or trouble breathing.   This information is not intended to replace advice given to you by your health care provider. Make sure you discuss any questions you have with your health care provider.   Document Released: 12/26/2008 Document Revised: 10/20/2014 Document Reviewed: 11/30/2013 Elsevier Interactive Patient Education 2016 [employer] Nonspecific Chest Pain  Chest pain can be caused by many different  conditions. There is always a chance that your pain could be related to something serious, such as a heart attack or a blood clot in your lungs. Chest pain can also be caused by conditions that are not life-threatening. If you have chest pain, it is very important to follow up with your health care provider. CAUSES  Chest pain can be caused by:  Heartburn.  Pneumonia or bronchitis.  Anxiety or stress.  Inflammation around your heart (pericarditis) or lung (pleuritis or pleurisy).  A blood clot in your lung.  A collapsed lung (pneumothorax). It can develop suddenly on its own (spontaneous pneumothorax) or from trauma to the chest.  Shingles infection (varicella-zoster virus).  Heart attack.  Damage to the bones, muscles, and cartilage that make up your chest wall. This can include:  Bruised bones due to injury.  Strained muscles or cartilage due to frequent or repeated coughing or overwork.  Fracture to one or more ribs.  Sore cartilage due to inflammation (costochondritis). RISK FACTORS  Risk factors for chest pain may include:  Activities that increase your risk for trauma or injury to your chest.  Respiratory infections or conditions that cause frequent coughing.  Medical conditions or overeating that can cause heartburn.  Heart disease or family history of heart disease.  Conditions or health behaviors that increase your risk of developing a blood clot.  Having had chicken pox (varicella zoster). SIGNS AND SYMPTOMS Chest pain can feel  like:  Burning or tingling on the surface of your chest or deep in your chest.  Crushing, pressure, aching, or squeezing pain.  Dull or sharp pain that is worse when you move, cough, or take a deep breath.  Pain that is also felt in your back, neck, shoulder, or arm, or pain that spreads to any of these areas. Your chest pain may come and go, or it may stay constant. DIAGNOSIS Lab tests or other studies may be needed to find the  cause of your pain. Your health care provider may have you take a test called an ambulatory ECG (electrocardiogram). An ECG records your heartbeat patterns at the time the test is performed. You may also have other tests, such as:  Transthoracic echocardiogram (TTE). During echocardiography, sound waves are used to create a picture of all of the heart structures and to look at how blood flows through your heart.  Transesophageal echocardiogram (TEE).This is a more advanced imaging test that obtains images from inside your body. It allows your health care provider to see your heart in finer detail.  Cardiac monitoring. This allows your health care provider to monitor your heart rate and rhythm in real time.  Holter monitor. This is a portable device that records your heartbeat and can help to diagnose abnormal heartbeats. It allows your health care provider to track your heart activity for several days, if needed.  Stress tests. These can be done through exercise or by taking medicine that makes your heart beat more quickly.  Blood tests.  Imaging tests. TREATMENT  Your treatment depends on what is causing your chest pain. Treatment may include:  Medicines. These may include:  Acid blockers for heartburn.  Anti-inflammatory medicine.  Pain medicine for inflammatory conditions.  Antibiotic medicine, if an infection is present.  Medicines to dissolve blood clots.  Medicines to treat coronary artery disease.  Supportive care for conditions that do not require medicines. This may include:  Resting.  Applying heat or cold packs to injured areas.  Limiting activities until pain decreases. HOME CARE INSTRUCTIONS  If you were prescribed an antibiotic medicine, finish it all even if you start to feel better.  Avoid any activities that bring on chest pain.  Do not use any tobacco products, including cigarettes, chewing tobacco, or electronic cigarettes. If you need help quitting,  ask your health care provider.  Do not drink alcohol.  Take medicines only as directed by your health care provider.  Keep all follow-up visits as directed by your health care provider. This is important. This includes any further testing if your chest pain does not go away.  If heartburn is the cause for your chest pain, you may be told to keep your head raised (elevated) while sleeping. This reduces the chance that acid will go from your stomach into your esophagus.  Make lifestyle changes as directed by your health care provider. These may include:  Getting regular exercise. Ask your health care provider to suggest some activities that are safe for you.  Eating a heart-healthy diet. A registered dietitian can help you to learn healthy eating options.  Maintaining a healthy weight.  Managing diabetes, if necessary.  Reducing stress. SEEK MEDICAL CARE IF:  Your chest pain does not go away after treatment.  You have a rash with blisters on your chest.  You have a fever. SEEK IMMEDIATE MEDICAL CARE IF:   Your chest pain is worse.  You have an increasing cough, or you cough up blood.  You have severe abdominal pain.  You have severe weakness.  You faint.  You have chills.  You have sudden, unexplained chest discomfort.  You have sudden, unexplained discomfort in your arms, back, neck, or jaw.  You have shortness of breath at any time.  You suddenly start to sweat, or your skin gets clammy.  You feel nauseous or you vomit.  You suddenly feel light-headed or dizzy.  Your heart begins to beat quickly, or it feels like it is skipping beats. These symptoms may represent a serious problem that is an emergency. Do not wait to see if the symptoms will go away. Get medical help right away. Call your local emergency services (911 in the [place] Do not drive yourself to the hospital.   This information is not intended to replace advice given to you by your health care  provider. Make sure you discuss any questions you have with your health care provider.   Document Released: 07/09/2005 Document Revised: 10/20/2014 Document Reviewed: 05/05/2014 Elsevier Interactive Patient Education [employer]

## 2016-01-01 NOTE — ED Provider Notes (Signed)
CSN: [id number]     Arrival date & time 01/01/16  1400 History   First MD Initiated Contact with Patient 01/01/16 1425     Chief Complaint  Patient presents with  . Chest Pain   HPI The patient presents to the emergency room with complaints of chest pain and leg pain. Patient has history of chronic medical problems including COPD, frequent falls and a diagnosis of pseudodementia.  Patient was at physical therapy today to increase her core strength to help her with her issues of frequently falling. She left her therapy today and was sitting waiting to be picked up when she had the onset of sudden sharp pain in her left knee. Patient felt that it started to swell and there was a starburst-type explosion in her knee. Family checked her knee and thought that it was swollen as well. She also started developing sharp pain in the left chest that was radiating to her arm. The leg swelling has decreased however she is still complaining of moderate pain in her chest. Patient does not have a history of heart disease although she was admitted to the hospital previously and was told that her enzymes were elevated. She is scheduled to have a stress test in the next week. Past Medical History  Diagnosis Date  . Arthritis   . Bronchitis   . Hypertension   . Chronic back pain   . Chronic knee pain   . COPD (chronic obstructive pulmonary disease) (HCC)   . Memory difficulty 2015  . Weakness of both legs   . 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 Pain management   . Frequent falls   . Dizziness    Past Surgical History  Procedure Laterality Date  . Knee cartilage surgery Right 2007  . Appendectomy  1971  . Tonsillectomy  1971   Family History  Problem Relation Age of Onset  . Heart failure Father   . Diabetes Father   . Kidney failure Father    Social History  Substance Use Topics  . Smoking status: Current Every Day Smoker -- 0.50  packs/day for 10 years    Types: Cigarettes  . Smokeless tobacco: None     Comment: 04/30/15 less than 1 PPD  . Alcohol Use: 0.0 oz/week    0 Standard drinks or equivalent per week     Comment: Very rare   OB History    Gravida Para Term Preterm AB TAB SAB Ectopic Multiple Living   2 2 2             Review of Systems  All other systems reviewed and are negative.     Allergies  Bee venom; Mushroom extract complex; Amitriptyline; Toradol; Tramadol; and Trazodone and nefazodone  Home Medications   Prior to Admission medications   Medication Sig Start Date End Date Taking? Authorizing Provider  albuterol (PROVENTIL HFA;VENTOLIN HFA) 108 (90 BASE) MCG/ACT inhaler Inhale 2 puffs into the lungs every 6 (six) hours as needed for wheezing. 06/21/13   Nimish Normajean Glasgow, MD  amLODipine (NORVASC) 2.5 MG tablet Take 2 tablets (5 mg total) by mouth daily. 06/30/15   Otis Brace, MD  aspirin EC 81 MG tablet Take 1 tablet (81 mg total) by mouth daily. 12/22/15   Henderson Cloud, MD  cyclobenzaprine (FLEXERIL) 10 MG tablet Take 10 mg by mouth 3 (three) times daily as needed for muscle spasms.    Historical Provider, MD  diazepam (VALIUM) 5 MG tablet Take 5 mg by mouth 3 (three) times daily as needed for anxiety.    Historical Provider, MD  DULoxetine (CYMBALTA) 60 MG capsule Take 60 mg by mouth at bedtime.     Historical Provider, MD  EVZIO 0.4 MG/0.4ML SOAJ Inject 0.4 mLs into the muscle once as needed (for Anaphylaxis/allergic reaction).  02/19/15   Historical Provider, MD  gabapentin (NEURONTIN) 300 MG capsule Take 300 mg by mouth 3 (three) times daily.    Historical Provider, MD  metFORMIN (GLUCOPHAGE-XR) 500 MG 24 hr tablet Take 500 mg by mouth at bedtime.  12/15/14   Historical Provider, MD  metoprolol succinate (TOPROL-XL) 50 MG 24 hr tablet Take 50 mg by mouth at bedtime. 12/14/15   Historical Provider, MD  omeprazole (PRILOSEC) 20 MG capsule Take 1 capsule (20 mg total) by mouth daily.  06/30/15   Otis Brace, MD  oxyCODONE-acetaminophen (PERCOCET) 10-325 MG tablet Take 1 tablet by mouth every 6 (six) hours as needed for pain.    Historical Provider, MD  VOLTAREN 1 % GEL Apply 4 g topically every 8 (eight) hours.  02/06/15   Historical Provider, MD   BP 153/77 mmHg  Pulse 71  Temp(Src) 98.1 F (36.7 C) (Oral)  Resp 24  Ht 5\' 4"  (1.626 m)  Wt 102.059 kg  BMI 38.60 kg/m2  SpO2 94% Physical Exam  Constitutional: No distress.  Appears older than her stated age  HENT:  Head: Normocephalic and atraumatic.  Right Ear: External ear normal.  Left Ear: External ear normal.  Eyes: Conjunctivae are normal. Right eye exhibits no discharge. Left eye exhibits no discharge. No scleral icterus.  Neck: Neck supple. No tracheal deviation present.  Cardiovascular: Normal rate, regular rhythm and intact distal pulses.   Pulmonary/Chest: Effort normal and breath sounds normal. No stridor. No respiratory distress. She has no wheezes. She has no rales.  Abdominal: Soft. Bowel sounds are normal. She exhibits no distension. There is no tenderness. There is no rebound and no guarding.  Musculoskeletal: She exhibits no edema or tenderness.  Weak but palpable pulses in bilateral lower extremities, no cyanosis, no pallor, normal skin temperature bilaterally  Neurological: She is alert. She has normal strength. No cranial nerve deficit (no facial droop, extraocular movements intact, no slurred speech) or sensory deficit. She exhibits normal muscle tone. She displays no seizure activity. Coordination normal.  Generalized weakness  Skin: Skin is warm and dry. No rash noted. She is not diaphoretic.  Psychiatric: She has a normal mood and affect. Thought content normal. Her affect is not angry and not labile. Her speech is not rapid and/or pressured and not delayed. She is not slowed and not actively hallucinating. Cognition and memory are normal.  Nursing note and vitals reviewed.   ED Course   Procedures (including critical care time) Labs Review Labs Reviewed  CBC - Abnormal; Notable for the following:    WBC 11.7 (*)    RBC 5.74 (*)    Hemoglobin 17.2 (*)    HCT 51.8 (*)    All other components within normal limits  COMPREHENSIVE METABOLIC PANEL - Abnormal; Notable for the following:    Glucose, Bld 133 (*)    All other components within normal limits  APTT  PROTIME-INR  D-DIMER, QUANTITATIVE (NOT AT [facility])  Rosezena Sensor, ED    Imaging Review Dg Chest 2 View  01/01/2016  CLINICAL DATA:  Chest pain and left knee pain EXAM: CHEST  2 VIEW COMPARISON:  06/29/2015  FINDINGS: The heart size and mediastinal contours are within normal limits. Both lungs are clear. The visualized skeletal structures are unremarkable. IMPRESSION: No active cardiopulmonary disease. Electronically Signed   By: Alcide Clever M.D.   On: 01/01/2016 15:53   Dg Knee Complete 4 Views Left  01/01/2016  CLINICAL DATA:  Left knee pain and swelling EXAM: LEFT KNEE - COMPLETE 4+ VIEW COMPARISON:  12/23/2015 FINDINGS: Four views of the left knee submitted. No acute fracture or subluxation. There is mild narrowing of medial joint compartment. Minimal chondrocalcinosis. Narrowing of patellofemoral joint space. Mild spurring of patella. No joint effusion. Mild spurring of lateral femoral condyle. IMPRESSION: No acute fracture or subluxation. Mild degenerative changes as described above. No joint effusion. Electronically Signed   By: Natasha Mead M.D.   On: 01/01/2016 15:55   I have personally reviewed and evaluated these images and lab results as part of my medical decision-making.   EKG Interpretation   Date/Time:  Tuesday January 01 2016 14:10:03 EDT Ventricular Rate:  74 PR Interval:  151 QRS Duration: 89 QT Interval:  390 QTC Calculation: 433 R Axis:   68 Text Interpretation:  Sinus rhythm Low voltage, precordial leads Probable  anteroseptal infarct, old Borderline T abnormalities, inferior leads Poor   data quality No significant change since last tracing Confirmed by Kyndahl Jablon   MD-J, Aleea Hendry ([id number]) on 01/01/2016 2:14:07 PM      MDM   Final diagnoses:  Chest pain, unspecified chest pain type  Knee pain, acute, left    Patient presented to the emergency room with complaints of acute leg pain followed by chest pain. The symptoms were somewhat atypical. Patient does have cardiac risk factors and recently was in the hospital. She had elevated troponin that time and has followed up with cardiology. The patient has planned for nuclear medicine stress test.  First cardiac enzyme is normal. Her symptoms are improving. Plan will be to get a second troponin. If normal I think she is stable to continue with her outpatient follow-up that is rescheduled.  Regarding her knee pain she does not have any swelling in her leg. There is no evidence of effusion. I'm not sure if she had pain associated with her chronic pain issues and her spinal stenosis but I'm not seeing any evidence to suggest an acute neurovascular emergency. No evidence of infection.  1830  2nd set of enzymes are normal.  DC home with planned follow up  Linwood Dibbles, MD 01/01/16 [id number]

## 2016-01-01 NOTE — ED Notes (Signed)
Pt made aware to return if symptoms worsen or if any life threatening symptoms occur.  Pt requested to sit outside to wait on daughter to arrive, she also requested that security help her to get into her car.  Pt verbalized that she would not get up without help from wheelchair.  Pt reports ride will be here in 5 minutes.

## 2016-01-01 NOTE — ED Notes (Signed)
Pt crying and hyperventilating when assisted out of

## 2016-01-01 NOTE — ED Notes (Signed)
Pt made aware to return if symptoms worsen or if any life threatening symptoms occur.   

## 2016-01-09 ENCOUNTER — Inpatient Hospital Stay (HOSPITAL_COMMUNITY): Admission: RE | Admit: 2016-01-09 | Payer: Medicare Other | Source: Ambulatory Visit

## 2016-01-09 ENCOUNTER — Encounter (HOSPITAL_COMMUNITY)
Admission: RE | Admit: 2016-01-09 | Discharge: 2016-01-09 | Disposition: A | Discharge status: Home / Self Care | Payer: Medicare Other | Source: Ambulatory Visit | Attending: Physician Assistant | Admitting: Physician Assistant

## 2016-01-09 ENCOUNTER — Encounter (HOSPITAL_COMMUNITY): Payer: Self-pay

## 2016-01-09 DIAGNOSIS — R7989 Other specified abnormal findings of blood chemistry: Secondary | ICD-10-CM | POA: Diagnosis not present

## 2016-01-09 DIAGNOSIS — R778 Other specified abnormalities of plasma proteins: Secondary | ICD-10-CM

## 2016-01-09 LAB — NM MYOCAR MULTI W/SPECT W/WALL MOTION / EF
CHL CUP NUCLEAR SDS: 0
CHL CUP RESTING HR STRESS: 54 {beats}/min
LV sys vol: 37 mL
LVDIAVOL: 62 mL (ref 46–106)
Peak HR: 76 {beats}/min
RATE: 0.35
SRS: 1
SSS: 1
TID: 1.16

## 2016-01-09 MED ORDER — REGADENOSON 0.4 MG/5ML IV SOLN
INTRAVENOUS | Status: AC
  Administered 2016-01-09: 0.4 mg via INTRAVENOUS
  Filled 2016-01-09: qty 5

## 2016-01-09 MED ORDER — TECHNETIUM TC 99M SESTAMIBI - CARDIOLITE
30.0000 | Freq: Once | INTRAVENOUS | Status: AC | PRN
  Administered 2016-01-09: 11:00:00 29.2 via INTRAVENOUS

## 2016-01-09 MED ORDER — TECHNETIUM TC 99M SESTAMIBI GENERIC - CARDIOLITE
10.0000 | Freq: Once | INTRAVENOUS | Status: AC | PRN
  Administered 2016-01-09: 9.5 via INTRAVENOUS

## 2016-01-09 MED ORDER — SODIUM CHLORIDE 0.9% FLUSH
INTRAVENOUS | Status: AC
  Administered 2016-01-09: 10 mL via INTRAVENOUS
  Filled 2016-01-09: qty 10

## 2016-01-10 ENCOUNTER — Emergency Department (HOSPITAL_COMMUNITY): Payer: Medicare Other

## 2016-01-10 ENCOUNTER — Other Ambulatory Visit: Payer: Self-pay

## 2016-01-10 ENCOUNTER — Encounter (HOSPITAL_COMMUNITY): Payer: Self-pay | Admitting: *Deleted

## 2016-01-10 ENCOUNTER — Emergency Department (HOSPITAL_COMMUNITY)
Admission: EM | Admit: 2016-01-10 | Discharge: 2016-01-10 | Disposition: A | Discharge status: Home / Self Care | Payer: Medicare Other | Attending: Emergency Medicine | Admitting: Emergency Medicine

## 2016-01-10 DIAGNOSIS — I10 Essential (primary) hypertension: Secondary | ICD-10-CM | POA: Insufficient documentation

## 2016-01-10 DIAGNOSIS — F17210 Nicotine dependence, cigarettes, uncomplicated: Secondary | ICD-10-CM | POA: Diagnosis not present

## 2016-01-10 DIAGNOSIS — Z7982 Long term (current) use of aspirin: Secondary | ICD-10-CM | POA: Insufficient documentation

## 2016-01-10 DIAGNOSIS — Z79899 Other long term (current) drug therapy: Secondary | ICD-10-CM | POA: Diagnosis not present

## 2016-01-10 DIAGNOSIS — R55 Syncope and collapse: Secondary | ICD-10-CM | POA: Diagnosis present

## 2016-01-10 DIAGNOSIS — J449 Chronic obstructive pulmonary disease, unspecified: Secondary | ICD-10-CM | POA: Insufficient documentation

## 2016-01-10 DIAGNOSIS — R531 Weakness: Secondary | ICD-10-CM | POA: Diagnosis not present

## 2016-01-10 DIAGNOSIS — Z7984 Long term (current) use of oral hypoglycemic drugs: Secondary | ICD-10-CM | POA: Insufficient documentation

## 2016-01-10 LAB — COMPREHENSIVE METABOLIC PANEL
ALT: 17 U/L (ref 14–54)
AST: 22 U/L (ref 15–41)
Albumin: 3.6 g/dL (ref 3.5–5.0)
Alkaline Phosphatase: 98 U/L (ref 38–126)
Anion gap: 9 (ref 5–15)
BILIRUBIN TOTAL: 0.3 mg/dL (ref 0.3–1.2)
BUN: 13 mg/dL (ref 6–20)
CHLORIDE: 102 mmol/L (ref 101–111)
CO2: 26 mmol/L (ref 22–32)
CREATININE: 0.64 mg/dL (ref 0.44–1.00)
Calcium: 8.6 mg/dL — ABNORMAL LOW (ref 8.9–10.3)
Glucose, Bld: 127 mg/dL — ABNORMAL HIGH (ref 65–99)
POTASSIUM: 3.9 mmol/L (ref 3.5–5.1)
Sodium: 137 mmol/L (ref 135–145)
TOTAL PROTEIN: 6.7 g/dL (ref 6.5–8.1)

## 2016-01-10 LAB — CBC WITH DIFFERENTIAL/PLATELET
Basophils Absolute: 0 10*3/uL (ref 0.0–0.1)
Basophils Relative: 0 %
EOS PCT: 4 %
Eosinophils Absolute: 0.4 10*3/uL (ref 0.0–0.7)
HEMATOCRIT: 49.8 % — AB (ref 36.0–46.0)
Hemoglobin: 16.2 g/dL — ABNORMAL HIGH (ref 12.0–15.0)
LYMPHS ABS: 5.1 10*3/uL — AB (ref 0.7–4.0)
LYMPHS PCT: 54 %
MCH: 29.5 pg (ref 26.0–34.0)
MCHC: 32.5 g/dL (ref 30.0–36.0)
MCV: 90.5 fL (ref 78.0–100.0)
MONO ABS: 0.8 10*3/uL (ref 0.1–1.0)
MONOS PCT: 9 %
Neutro Abs: 3.1 10*3/uL (ref 1.7–7.7)
Neutrophils Relative %: 33 %
PLATELETS: 248 10*3/uL (ref 150–400)
RBC: 5.5 MIL/uL — ABNORMAL HIGH (ref 3.87–5.11)
RDW: 14.2 % (ref 11.5–15.5)
WBC: 9.5 10*3/uL (ref 4.0–10.5)

## 2016-01-10 LAB — URINALYSIS, ROUTINE W REFLEX MICROSCOPIC
BILIRUBIN URINE: NEGATIVE
GLUCOSE, UA: NEGATIVE mg/dL
HGB URINE DIPSTICK: NEGATIVE
Ketones, ur: NEGATIVE mg/dL
Leukocytes, UA: NEGATIVE
Nitrite: NEGATIVE
Protein, ur: NEGATIVE mg/dL
Specific Gravity, Urine: 1.02 (ref 1.005–1.030)
pH: 5 (ref 5.0–8.0)

## 2016-01-10 LAB — LACTIC ACID, PLASMA: Lactic Acid, Venous: 1.7 mmol/L (ref 0.5–2.0)

## 2016-01-10 LAB — TROPONIN I

## 2016-01-10 MED ORDER — NALOXONE HCL 2 MG/2ML IJ SOSY
1.0000 mg | PREFILLED_SYRINGE | Freq: Once | INTRAMUSCULAR | Status: AC
  Administered 2016-01-10: 1 mg via INTRAVENOUS
  Filled 2016-01-10: qty 2

## 2016-01-10 NOTE — ED Notes (Signed)
Pt had significant improvement with slurred speech and respiratory effort after narcan 1mg  administered.   Ambulated to bedside commode with minimal 1 person assist, also improvement. Initially upon arrival pt was not able to ambulate with 1 person assist.   C/c chills, muscle cramping.

## 2016-01-10 NOTE — ED Notes (Signed)
Labs sent

## 2016-01-10 NOTE — ED Notes (Signed)
Extensive teaching done regarding medication management with patient and daughter. Daughter [name] [phone]) voiced concerns that maybe while patient was more confused these past 2 days she may have accidentally taken additional meds. Option of placing meds in [facility] dispenser for her mom was discussed.   At time of discharge pt returned to a lesser drowsy state. Assisted to restrooom at time of discharge.

## 2016-01-10 NOTE — ED Notes (Signed)
Pt arrived by EMS, states pt got up to go to bed & fell in the floor. Pt sluggish & drowsy. Pt denies taking any medications.

## 2016-01-10 NOTE — ED Provider Notes (Signed)
CSN: [id number]     Arrival date & time 01/10/16  1855 History   First MD Initiated Contact with Patient 01/10/16 1929     Chief Complaint  Patient presents with  . Near Syncope  . Fall     (Consider location/radiation/quality/duration/timing/severity/associated sxs/prior Treatment) Patient is a 65 y.o. female presenting with near-syncope and fall.  Near Syncope  Fall    Past Medical History  Diagnosis Date  . Arthritis   . Bronchitis   . Hypertension   . Chronic back pain   . Chronic knee pain   . COPD (chronic obstructive pulmonary disease) (HCC)   . Memory difficulty 2015  . Weakness of both legs   . 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 Pain management   . Frequent falls   . Dizziness    Past Surgical History  Procedure Laterality Date  . Knee cartilage surgery Right 2007  . Appendectomy  1971  . Tonsillectomy  1971   Family History  Problem Relation Age of Onset  . Heart failure Father   . Diabetes Father   . Kidney failure Father    Social History  Substance Use Topics  . Smoking status: Current Every Day Smoker -- 0.50 packs/day for 10 years    Types: Cigarettes  . Smokeless tobacco: None     Comment: 04/30/15 less than 1 PPD  . Alcohol Use: 0.0 oz/week    0 Standard drinks or equivalent per week     Comment: Very rare   OB History    Gravida Para Term Preterm AB TAB SAB Ectopic Multiple Living   2 2 2             Review of Systems  Cardiovascular: Positive for near-syncope.      Allergies  Bee venom; Mushroom extract complex; Amitriptyline; Toradol; Tramadol; and Trazodone and nefazodone  Home Medications   Prior to Admission medications   Medication Sig Start Date End Date Taking? Authorizing Provider  amLODipine (NORVASC) 2.5 MG tablet Take 2 tablets (5 mg total) by mouth daily. 06/30/15  Yes Marjan Rabbani, MD  aspirin EC 81 MG tablet Take 1 tablet (81 mg total) by mouth  daily. 12/22/15  Yes Estela Isaiah Blakes, MD  cyclobenzaprine (FLEXERIL) 10 MG tablet Take 10 mg by mouth 3 (three) times daily as needed for muscle spasms.   Yes Historical Provider, MD  diazepam (VALIUM) 5 MG tablet Take 5 mg by mouth 3 (three) times daily as needed for anxiety.   Yes Historical Provider, MD  DULoxetine (CYMBALTA) 60 MG capsule Take 60 mg by mouth at bedtime.    Yes Historical Provider, MD  gabapentin (NEURONTIN) 300 MG capsule Take 300 mg by mouth 3 (three) times daily.   Yes Historical Provider, MD  metFORMIN (GLUCOPHAGE-XR) 500 MG 24 hr tablet Take 500 mg by mouth at bedtime.  12/15/14  Yes Historical Provider, MD  metoprolol succinate (TOPROL-XL) 50 MG 24 hr tablet Take 50 mg by mouth at bedtime. 12/14/15  Yes Historical Provider, MD  omeprazole (PRILOSEC) 20 MG capsule Take 1 capsule (20 mg total) by mouth daily. 06/30/15  Yes Otis Brace, MD  oxyCODONE-acetaminophen (PERCOCET) 10-325 MG tablet Take 1 tablet by mouth every 6 (six) hours as needed for pain.   Yes Historical Provider, MD  VOLTAREN 1 % GEL Apply 4 g topically every 8 (eight) hours.  02/06/15  Yes Historical Provider, MD  albuterol (PROVENTIL HFA;VENTOLIN HFA) 108 (  90 BASE) MCG/ACT inhaler Inhale 2 puffs into the lungs every 6 (six) hours as needed for wheezing. 06/21/13   Nimish Normajean Glasgow, MD  EVZIO 0.4 MG/0.4ML SOAJ Inject 0.4 mLs into the muscle once as needed (for Anaphylaxis/allergic reaction).  02/19/15   Historical Provider, MD   BP 183/85 mmHg  Pulse 73  Temp(Src) 97.7 F (36.5 C) (Oral)  Resp 24  Ht  (1.626 m)  Wt 225 lb (102.059 kg)  BMI 38.60 kg/m2  SpO2 97% Physical Exam  ED Course  Procedures (including critical care time) Labs Review Labs Reviewed  CBC WITH DIFFERENTIAL/PLATELET - Abnormal; Notable for the following:    RBC 5.50 (*)    Hemoglobin 16.2 (*)    HCT 49.8 (*)    Lymphs Abs 5.1 (*)    All other components within normal limits  COMPREHENSIVE METABOLIC PANEL - Abnormal;  Notable for the following:    Glucose, Bld 127 (*)    Calcium 8.6 (*)    All other components within normal limits  TROPONIN I  URINALYSIS, ROUTINE W REFLEX MICROSCOPIC (NOT AT [facility])  LACTIC ACID, PLASMA    Imaging Review Ct Head Wo Contrast  01/10/2016  CLINICAL DATA:  Weakness, confusion and drowsiness today. EXAM: CT HEAD WITHOUT CONTRAST TECHNIQUE: Contiguous axial images were obtained from the base of the skull through the vertex without intravenous contrast. COMPARISON:  12/23/2015 FINDINGS: The ventricles are normal in size and configuration. There are no parenchymal masses or mass effect. There is no evidence of a cortical infarct. There are no extra-axial masses or abnormal fluid collections. There is no intracranial hemorrhage. Small amount of dependent fluid noted in the left maxillary sinus. Remaining visualized sinuses are clear as are the mastoid air cells. IMPRESSION: 1. No acute intracranial abnormalities. 2. Small amount of dependent fluid in the left maxillary sinus. Electronically Signed   By: Amie Portland M.D.   On: 01/10/2016 21:13   Nm Myocar Multi W/spect W/wall Motion / Ef  01/09/2016   There was no ST segment deviation noted during stress.  This is a low risk study. No ischemia or infarction.  Nuclear stress EF: 41%. However, there appears to be normal systolic function. Would recommend echocardiography for a more accurate assessment of LVEF.    Dg Chest Portable 1 View  01/10/2016  CLINICAL DATA:  Larey Seat from bed on floor with chest pain, initial encounter EXAM: PORTABLE CHEST 1 VIEW COMPARISON:  01/01/2016 FINDINGS: Cardiac shadow is within normal limits. The lungs are well aerated bilaterally. No pneumothorax is seen. No acute bony abnormality is seen. IMPRESSION: No active disease. Electronically Signed   By: Alcide Clever M.D.   On: 01/10/2016 20:09   I have personally reviewed and evaluated these images and lab results as part of my medical decision-making.   EKG  Interpretation None      MDM   Final diagnoses:  Weakness    Patient had lethargy when she first arrived. This improved with Narcan. Suspect her weakness was secondary to too much narcotics   Bethann Berkshire, MD 01/10/16 2153

## 2016-01-10 NOTE — ED Notes (Signed)
Patient asleep at this time. Equal rise and fall of chest and no distress noted at this time.

## 2016-01-10 NOTE — Discharge Instructions (Signed)
Follow up with your family md next week for recheck °

## 2016-01-16 ENCOUNTER — Encounter: Payer: Self-pay | Admitting: Cardiovascular Disease

## 2016-01-16 ENCOUNTER — Ambulatory Visit (INDEPENDENT_AMBULATORY_CARE_PROVIDER_SITE_OTHER): Payer: Medicare Other | Admitting: Cardiovascular Disease

## 2016-01-16 VITALS — BP 132/77 | HR 77 | Ht 64.0 in | Wt 227.0 lb

## 2016-01-16 DIAGNOSIS — R9431 Abnormal electrocardiogram [ECG] [EKG]: Secondary | ICD-10-CM

## 2016-01-16 DIAGNOSIS — Z9289 Personal history of other medical treatment: Secondary | ICD-10-CM

## 2016-01-16 DIAGNOSIS — Z87898 Personal history of other specified conditions: Secondary | ICD-10-CM | POA: Diagnosis not present

## 2016-01-16 DIAGNOSIS — R7989 Other specified abnormal findings of blood chemistry: Secondary | ICD-10-CM

## 2016-01-16 DIAGNOSIS — R778 Other specified abnormalities of plasma proteins: Secondary | ICD-10-CM

## 2016-01-16 NOTE — Progress Notes (Signed)
Patient ID: Carla [name], female   DOB: [birth year], 65 y.o.   MRN: [id number]      SUBJECTIVE: The patient returns for posthospitalization follow-up. I evaluated her on 12/21/15 for elevated troponins and an abnormal ECG. I recommended aspirin. She is on metoprolol succinate. Due to her diabetes I felt statin therapy would be reasonable. It was not initiated.  Echocardiogram on 06/29/15 demonstrated normal LV systolic function, EF 60%. Regional wall motion abnormalities could not be excluded as it was a very technically limited study.  I recommended an outpatient nuclear stress test which was performed on 01/09/16 and was a low risk study with no evidence of myocardial ischemia or infarction. While calculated LVEF was 41%, systolic function appeared to be grossly normal.  She denies chest pain. She has episodic vertigo.   Review of Systems: As per "subjective", otherwise negative.  Allergies  Allergen Reactions  . Bee Venom Anaphylaxis  . Mushroom Extract Complex Anaphylaxis    Extreme sweating, vomiting  . Amitriptyline Other (See Comments)    Has nightmares when using, not in right state of mind   . Toradol [Ketorolac Tromethamine] Nausea And Vomiting  . Tramadol Nausea And Vomiting  . Trazodone And Nefazodone Nausea And Vomiting    Current Outpatient Prescriptions  Medication Sig Dispense Refill  . albuterol (PROVENTIL HFA;VENTOLIN HFA) 108 (90 BASE) MCG/ACT inhaler Inhale 2 puffs into the lungs every 6 (six) hours as needed for wheezing. 1 Inhaler 2  . amLODipine (NORVASC) 2.5 MG tablet Take 2 tablets (5 mg total) by mouth daily.    Marland Kitchen aspirin EC 81 MG tablet Take 1 tablet (81 mg total) by mouth daily.    . cyclobenzaprine (FLEXERIL) 10 MG tablet Take 10 mg by mouth 3 (three) times daily as needed for muscle spasms.    . diazepam (VALIUM) 5 MG tablet Take 5 mg by mouth 3 (three) times daily as needed for anxiety.    . DULoxetine (CYMBALTA) 60 MG capsule Take 60 mg by mouth at  bedtime.     Marland Kitchen EVZIO 0.4 MG/0.4ML SOAJ Inject 0.4 mLs into the muscle once as needed (for Anaphylaxis/allergic reaction).     . gabapentin (NEURONTIN) 300 MG capsule Take 300 mg by mouth 3 (three) times daily.    . metFORMIN (GLUCOPHAGE-XR) 500 MG 24 hr tablet Take 500 mg by mouth at bedtime.     . metoprolol succinate (TOPROL-XL) 50 MG 24 hr tablet Take 50 mg by mouth at bedtime.    Marland Kitchen omeprazole (PRILOSEC) 20 MG capsule Take 1 capsule (20 mg total) by mouth daily. 30 capsule 0  . oxyCODONE-acetaminophen (PERCOCET) 10-325 MG tablet Take 1 tablet by mouth every 6 (six) hours as needed for pain.    Marland Kitchen VOLTAREN 1 % GEL Apply 4 g topically every 8 (eight) hours.      No current facility-administered medications for this visit.    Past Medical History  Diagnosis Date  . Arthritis   . Bronchitis   . Hypertension   . Chronic back pain   . Chronic knee pain   . COPD (chronic obstructive pulmonary disease) (HCC)   . Memory difficulty 2015  . Weakness of both legs   . 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 Pain management   . Frequent falls   . Dizziness     Past Surgical History  Procedure Laterality Date  . Knee cartilage surgery Right 2007  . Appendectomy  1971  .  Tonsillectomy  1971    Social History   Social History  . Marital Status: Widowed    Spouse Name: N/A  . Number of Children: 2  . Years of Education: 12   Occupational History  . Retired Statistician and [employer]    Social History Main Topics  . Smoking status: Current Every Day Smoker -- 0.50 packs/day for 10 years    Types: Cigarettes    Start date: 08/05/1970  . Smokeless tobacco: Never Used     Comment: 04/30/15 less than 1 PPD  . Alcohol Use: 0.0 oz/week    0 Standard drinks or equivalent per week     Comment: Very rare  . Drug Use: No  . Sexual Activity: Not on file   Other Topics Concern  . Not on file   Social History Narrative   Lives at  home with your 2 daughters, son-in-law, 4 grandkids and dogs   Right and left handed   Caffeine use: very rare .Has coke 2 times a week.   Multiple uncles and aunts on her father's side have heart disease.        Filed Vitals:   01/16/16 1139  BP: 132/77  Pulse: 77  Height:  (1.626 m)  Weight: 227 lb (102.967 kg)  SpO2: 96%    PHYSICAL EXAM General: NAD Neck: No JVD, no thyromegaly or thyroid nodule.  Lungs: Clear to auscultation bilaterally with normal respiratory effort. CV: Nondisplaced PMI. Regular rate and rhythm, normal S1/S2, no S3/S4, no murmur. No peripheral edema.  Abdomen: Soft,obese. Skin: Intact without lesions or rashes.  Neurologic: Alert and oriented x 3.  Psych: Normal affect. HEENT: Normal.   ECG: Most recent ECG reviewed.      ASSESSMENT AND PLAN: 1. Elevated troponin with abnormal ECG: Given low risk nuclear stress test, no further testing is indicated. Continue ASA and metoprolol. Would recommend statin therapy given concomitant diabetes,  but I will defer to PCP for initiation and monitoring.  Dispo: f/u prn.  Prentice Docker, M.D., F.A.C.C.

## 2016-01-16 NOTE — Patient Instructions (Signed)
Your physician recommends that you schedule a follow-up appointment AS NEEDED WITH DR. KONESWARAN  Your physician recommends that you continue on your current medications as directed. Please refer to the Current Medication list given to you today.  Thank you for choosing [facility]!!   

## 2016-01-28 ENCOUNTER — Encounter (HOSPITAL_COMMUNITY): Payer: Self-pay

## 2016-02-02 ENCOUNTER — Encounter (HOSPITAL_COMMUNITY): Payer: Self-pay | Admitting: Emergency Medicine

## 2016-02-02 ENCOUNTER — Emergency Department (HOSPITAL_COMMUNITY)
Admission: EM | Admit: 2016-02-02 | Discharge: 2016-02-02 | Disposition: A | Discharge status: Home / Self Care | Payer: Medicare Other | Attending: Emergency Medicine | Admitting: Emergency Medicine

## 2016-02-02 DIAGNOSIS — Z7982 Long term (current) use of aspirin: Secondary | ICD-10-CM | POA: Insufficient documentation

## 2016-02-02 DIAGNOSIS — I10 Essential (primary) hypertension: Secondary | ICD-10-CM | POA: Diagnosis not present

## 2016-02-02 DIAGNOSIS — Z79899 Other long term (current) drug therapy: Secondary | ICD-10-CM | POA: Insufficient documentation

## 2016-02-02 DIAGNOSIS — M5440 Lumbago with sciatica, unspecified side: Secondary | ICD-10-CM | POA: Insufficient documentation

## 2016-02-02 DIAGNOSIS — Z79891 Long term (current) use of opiate analgesic: Secondary | ICD-10-CM | POA: Diagnosis not present

## 2016-02-02 DIAGNOSIS — M1990 Unspecified osteoarthritis, unspecified site: Secondary | ICD-10-CM | POA: Insufficient documentation

## 2016-02-02 DIAGNOSIS — F17210 Nicotine dependence, cigarettes, uncomplicated: Secondary | ICD-10-CM | POA: Diagnosis not present

## 2016-02-02 DIAGNOSIS — J449 Chronic obstructive pulmonary disease, unspecified: Secondary | ICD-10-CM | POA: Diagnosis not present

## 2016-02-02 DIAGNOSIS — M545 Low back pain: Secondary | ICD-10-CM | POA: Diagnosis present

## 2016-02-02 MED ORDER — FENTANYL CITRATE (PF) 100 MCG/2ML IJ SOLN
100.0000 ug | Freq: Once | INTRAMUSCULAR | Status: AC
  Administered 2016-02-02: 100 ug via INTRAVENOUS
  Filled 2016-02-02: qty 2

## 2016-02-02 MED ORDER — SODIUM CHLORIDE 0.9 % IV BOLUS (SEPSIS)
500.0000 mL | Freq: Once | INTRAVENOUS | Status: AC
  Administered 2016-02-02: 500 mL via INTRAVENOUS

## 2016-02-02 MED ORDER — HYDROCODONE-ACETAMINOPHEN 5-325 MG PO TABS: 1.0000 | ORAL_TABLET | ORAL | Status: DC | PRN

## 2016-02-02 MED ORDER — ONDANSETRON HCL 4 MG/2ML IJ SOLN
4.0000 mg | Freq: Once | INTRAMUSCULAR | Status: AC
  Administered 2016-02-02: 4 mg via INTRAVENOUS
  Filled 2016-02-02: qty 2

## 2016-02-02 MED ORDER — FENTANYL CITRATE (PF) 100 MCG/2ML IJ SOLN
50.0000 ug | Freq: Once | INTRAMUSCULAR | Status: AC
  Administered 2016-02-02: 50 ug via INTRAVENOUS
  Filled 2016-02-02: qty 2

## 2016-02-02 MED ORDER — PREDNISONE 50 MG PO TABS: ORAL_TABLET | ORAL | Status: DC

## 2016-02-02 MED ORDER — METHYLPREDNISOLONE SODIUM SUCC 125 MG IJ SOLR
125.0000 mg | Freq: Once | INTRAMUSCULAR | Status: AC
  Administered 2016-02-02: 125 mg via INTRAVENOUS
  Filled 2016-02-02: qty 2

## 2016-02-02 NOTE — ED Notes (Signed)
Patient watching TV at this time. No distress noted. Equal rise and fall of chest. Patient states that she can feel her knees at this time. Stated early when she came in she had numbness.

## 2016-02-02 NOTE — ED Notes (Signed)
Pt states understanding of care given and follow up instructions.  Taken to waiting room for family to pick up pt and transport home

## 2016-02-02 NOTE — Discharge Instructions (Signed)
Ice pack to lower back. Medications for pain and prednisone. Take medications with food. Follow-up with your pain management doctor.

## 2016-02-02 NOTE — ED Provider Notes (Signed)
CSN: [id number]     Arrival date & time 02/02/16  1539 History   First MD Initiated Contact with Patient 02/02/16 1548     Chief Complaint  Patient presents with  . Weakness     (Consider location/radiation/quality/duration/timing/severity/associated sxs/prior Treatment) HPI.Marland KitchenMarland KitchenMarland KitchenLower back pain with radiation to both legs earlier today after a near fall. Patient has had chronic low back pain with radicular symptoms and has seen a pain management doctor in [place].  No bowel or bladder incontinence. No new trauma. MRI from 10/22/13 revealed spinal stenosis at L4-5 and L2-3.  Severity of pain is moderate.  Past Medical History  Diagnosis Date  . Arthritis   . Bronchitis   . Hypertension   . Chronic back pain   . Chronic knee pain   . COPD (chronic obstructive pulmonary disease) (HCC)   . Memory difficulty 2015  . Weakness of both legs   . 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 Pain management   . Frequent falls   . Dizziness    Past Surgical History  Procedure Laterality Date  . Knee cartilage surgery Right 2007  . Appendectomy  1971  . Tonsillectomy  1971   Family History  Problem Relation Age of Onset  . Heart failure Father   . Diabetes Father   . Kidney failure Father    Social History  Substance Use Topics  . Smoking status: Current Every Day Smoker -- 0.50 packs/day for 10 years    Types: Cigarettes    Start date: 08/05/1970  . Smokeless tobacco: Never Used     Comment: 04/30/15 less than 1 PPD  . Alcohol Use: 0.0 oz/week    0 Standard drinks or equivalent per week     Comment: Very rare   OB History    Gravida Para Term Preterm AB TAB SAB Ectopic Multiple Living   2 2 2             Review of Systems  All other systems reviewed and are negative.     Allergies  Bee venom; Mushroom extract complex; Amitriptyline; Toradol; Tramadol; and Trazodone and nefazodone  Home Medications   Prior  to Admission medications   Medication Sig Start Date End Date Taking? Authorizing Provider  albuterol (PROVENTIL HFA;VENTOLIN HFA) 108 (90 BASE) MCG/ACT inhaler Inhale 2 puffs into the lungs every 6 (six) hours as needed for wheezing. 06/21/13  Yes Nimish Normajean Glasgow, MD  amLODipine (NORVASC) 2.5 MG tablet Take 2 tablets (5 mg total) by mouth daily. 06/30/15  Yes Marjan Rabbani, MD  aspirin EC 81 MG tablet Take 1 tablet (81 mg total) by mouth daily. 12/22/15  Yes Henderson Cloud, MD  Cholecalciferol (VITAMIN D) 2000 units CAPS Take 1 capsule by mouth daily.   Yes Historical Provider, MD  cyclobenzaprine (FLEXERIL) 10 MG tablet Take 10 mg by mouth 3 (three) times daily as needed for muscle spasms.   Yes Historical Provider, MD  diazepam (VALIUM) 5 MG tablet Take 5 mg by mouth 3 (three) times daily as needed for anxiety.   Yes Historical Provider, MD  DULoxetine (CYMBALTA) 60 MG capsule Take 60 mg by mouth at bedtime.    Yes Historical Provider, MD  EVZIO 0.4 MG/0.4ML SOAJ Inject 0.4 mLs into the muscle once as needed (for Anaphylaxis/allergic reaction).  02/19/15  Yes Historical Provider, MD  gabapentin (NEURONTIN) 300 MG capsule Take 300 mg by mouth 3 (three) times daily.   Yes  Historical Provider, MD  metFORMIN (GLUCOPHAGE-XR) 500 MG 24 hr tablet Take 500 mg by mouth at bedtime.  12/15/14  Yes Historical Provider, MD  metoprolol succinate (TOPROL-XL) 50 MG 24 hr tablet Take 50 mg by mouth at bedtime. 12/14/15  Yes Historical Provider, MD  omeprazole (PRILOSEC) 20 MG capsule Take 1 capsule (20 mg total) by mouth daily. 06/30/15  Yes Otis Brace, MD  oxyCODONE-acetaminophen (PERCOCET) 10-325 MG tablet Take 1 tablet by mouth every 6 (six) hours as needed for pain.   Yes Historical Provider, MD  VOLTAREN 1 % GEL Apply 4 g topically every 8 (eight) hours.  02/06/15  Yes Historical Provider, MD  HYDROcodone-acetaminophen (NORCO/VICODIN) 5-325 MG tablet Take 1-2 tablets by mouth every 4 (four) hours as  needed. 02/02/16   Donnetta Hutching, MD  predniSONE (DELTASONE) 50 MG tablet 1 tablet for 6 days, one half tablet for 6 days 02/02/16   Donnetta Hutching, MD   BP 136/69 mmHg  Pulse 64  Temp(Src) 98.2 F (36.8 C)  Resp 20  Ht  (1.626 m)  Wt 220 lb (99.791 kg)  BMI 37.74 kg/m2  SpO2 93% Physical Exam  Constitutional: She is oriented to person, place, and time.  Obese, good color  HENT:  Head: Normocephalic and atraumatic.  Eyes: Conjunctivae and EOM are normal. Pupils are equal, round, and reactive to light.  Neck: Normal range of motion. Neck supple.  Cardiovascular: Normal rate and regular rhythm.   Pulmonary/Chest: Effort normal and breath sounds normal.  Abdominal: Soft. Bowel sounds are normal.  Musculoskeletal:  Tender entire lumbar spine. Very painful for patient to attempt to lift her legs  Neurological: She is alert and oriented to person, place, and time.  Skin: Skin is warm and dry.  Psychiatric: She has a normal mood and affect. Her behavior is normal.  Nursing note and vitals reviewed.   ED Course  Procedures (including critical care time) Labs Review Labs Reviewed - No data to display  Imaging Review No results found. I have personally reviewed and evaluated these images and lab results as part of my medical decision-making.   EKG Interpretation None      MDM   Final diagnoses:  Low back pain with sciatica, sciatica laterality unspecified, unspecified back pain laterality    Patient feels better after pain management and IV steroids. Will discharge home with Norco and prednisone. She has a pain management physician for her lower back. There is no bowel or bladder incontinence tonight.    Donnetta Hutching, MD 02/02/16 1958

## 2016-02-02 NOTE — ED Notes (Addendum)
Per EMS- pt daughter reports pt had near fall today and is generally weak. Pt c/o severe lower back pain radiating into both legs.

## 2016-03-07 ENCOUNTER — Inpatient Hospital Stay (HOSPITAL_COMMUNITY)
Admission: EM | Admit: 2016-03-07 | Discharge: 2016-03-09 | DRG: 446 | Disposition: A | Discharge status: Home / Self Care | Payer: Medicare Other | Attending: Internal Medicine | Admitting: Internal Medicine

## 2016-03-07 ENCOUNTER — Encounter (HOSPITAL_COMMUNITY): Payer: Self-pay

## 2016-03-07 ENCOUNTER — Emergency Department (HOSPITAL_COMMUNITY): Payer: Medicare Other

## 2016-03-07 DIAGNOSIS — K8000 Calculus of gallbladder with acute cholecystitis without obstruction: Secondary | ICD-10-CM | POA: Diagnosis not present

## 2016-03-07 DIAGNOSIS — Z8249 Family history of ischemic heart disease and other diseases of the circulatory system: Secondary | ICD-10-CM | POA: Diagnosis not present

## 2016-03-07 DIAGNOSIS — R1013 Epigastric pain: Secondary | ICD-10-CM | POA: Diagnosis not present

## 2016-03-07 DIAGNOSIS — G894 Chronic pain syndrome: Secondary | ICD-10-CM | POA: Diagnosis not present

## 2016-03-07 DIAGNOSIS — M48062 Spinal stenosis, lumbar region with neurogenic claudication: Secondary | ICD-10-CM | POA: Diagnosis present

## 2016-03-07 DIAGNOSIS — Z841 Family history of disorders of kidney and ureter: Secondary | ICD-10-CM

## 2016-03-07 DIAGNOSIS — R079 Chest pain, unspecified: Secondary | ICD-10-CM | POA: Diagnosis present

## 2016-03-07 DIAGNOSIS — R946 Abnormal results of thyroid function studies: Secondary | ICD-10-CM | POA: Diagnosis present

## 2016-03-07 DIAGNOSIS — M4806 Spinal stenosis, lumbar region: Secondary | ICD-10-CM | POA: Diagnosis present

## 2016-03-07 DIAGNOSIS — I10 Essential (primary) hypertension: Secondary | ICD-10-CM | POA: Diagnosis present

## 2016-03-07 DIAGNOSIS — F418 Other specified anxiety disorders: Secondary | ICD-10-CM | POA: Diagnosis present

## 2016-03-07 DIAGNOSIS — Z833 Family history of diabetes mellitus: Secondary | ICD-10-CM | POA: Diagnosis not present

## 2016-03-07 DIAGNOSIS — F17210 Nicotine dependence, cigarettes, uncomplicated: Secondary | ICD-10-CM | POA: Diagnosis present

## 2016-03-07 DIAGNOSIS — F32A Depression, unspecified: Secondary | ICD-10-CM | POA: Diagnosis present

## 2016-03-07 DIAGNOSIS — K810 Acute cholecystitis: Secondary | ICD-10-CM | POA: Diagnosis present

## 2016-03-07 DIAGNOSIS — J449 Chronic obstructive pulmonary disease, unspecified: Secondary | ICD-10-CM | POA: Diagnosis present

## 2016-03-07 DIAGNOSIS — K8020 Calculus of gallbladder without cholecystitis without obstruction: Secondary | ICD-10-CM | POA: Diagnosis present

## 2016-03-07 DIAGNOSIS — R7303 Prediabetes: Secondary | ICD-10-CM | POA: Diagnosis present

## 2016-03-07 DIAGNOSIS — R55 Syncope and collapse: Secondary | ICD-10-CM | POA: Diagnosis not present

## 2016-03-07 DIAGNOSIS — F329 Major depressive disorder, single episode, unspecified: Secondary | ICD-10-CM | POA: Diagnosis present

## 2016-03-07 DIAGNOSIS — D72829 Elevated white blood cell count, unspecified: Secondary | ICD-10-CM

## 2016-03-07 DIAGNOSIS — E119 Type 2 diabetes mellitus without complications: Secondary | ICD-10-CM | POA: Diagnosis present

## 2016-03-07 HISTORY — DX: Spinal stenosis, lumbar region with neurogenic claudication: M48.062

## 2016-03-07 HISTORY — DX: Major depressive disorder, single episode, unspecified: F32.9

## 2016-03-07 LAB — COMPREHENSIVE METABOLIC PANEL
ALBUMIN: 4.1 g/dL (ref 3.5–5.0)
ALK PHOS: 95 U/L (ref 38–126)
ALT: 13 U/L — ABNORMAL LOW (ref 14–54)
ANION GAP: 10 (ref 5–15)
AST: 17 U/L (ref 15–41)
BUN: 15 mg/dL (ref 6–20)
CALCIUM: 9.1 mg/dL (ref 8.9–10.3)
CHLORIDE: 101 mmol/L (ref 101–111)
CO2: 24 mmol/L (ref 22–32)
Creatinine, Ser: 0.65 mg/dL (ref 0.44–1.00)
GFR calc non Af Amer: >60 mL/min (ref >60)
GLUCOSE: 140 mg/dL — AB (ref 65–99)
POTASSIUM: 3.5 mmol/L (ref 3.5–5.1)
SODIUM: 135 mmol/L (ref 135–145)
Total Bilirubin: 0.4 mg/dL (ref 0.3–1.2)
Total Protein: 7.4 g/dL (ref 6.5–8.1)

## 2016-03-07 LAB — URINALYSIS, ROUTINE W REFLEX MICROSCOPIC
Bilirubin Urine: NEGATIVE
GLUCOSE, UA: 100 mg/dL — AB
KETONES UR: NEGATIVE mg/dL
LEUKOCYTES UA: NEGATIVE
NITRITE: NEGATIVE
PROTEIN: NEGATIVE mg/dL
Specific Gravity, Urine: 1.01 (ref 1.005–1.030)
pH: 7 (ref 5.0–8.0)

## 2016-03-07 LAB — GLUCOSE, CAPILLARY
GLUCOSE-CAPILLARY: 145 mg/dL — AB (ref 65–99)
GLUCOSE-CAPILLARY: 110 mg/dL — AB (ref 65–99)
Glucose-Capillary: 123 mg/dL — ABNORMAL HIGH (ref 65–99)

## 2016-03-07 LAB — CBC WITH DIFFERENTIAL/PLATELET
BASOS ABS: 0 10*3/uL (ref 0.0–0.1)
BASOS PCT: 0 %
EOS PCT: 1 %
Eosinophils Absolute: 0.3 10*3/uL (ref 0.0–0.7)
HCT: 47.3 % — ABNORMAL HIGH (ref 36.0–46.0)
Hemoglobin: 15.6 g/dL — ABNORMAL HIGH (ref 12.0–15.0)
LYMPHS PCT: 25 %
Lymphs Abs: 4.5 10*3/uL — ABNORMAL HIGH (ref 0.7–4.0)
MCH: 30.1 pg (ref 26.0–34.0)
MCHC: 33 g/dL (ref 30.0–36.0)
MCV: 91.1 fL (ref 78.0–100.0)
Monocytes Absolute: 1.3 10*3/uL — ABNORMAL HIGH (ref 0.1–1.0)
Monocytes Relative: 7 %
Neutro Abs: 12.2 10*3/uL — ABNORMAL HIGH (ref 1.7–7.7)
Neutrophils Relative %: 67 %
PLATELETS: 284 10*3/uL (ref 150–400)
RBC: 5.19 MIL/uL — AB (ref 3.87–5.11)
RDW: 14.5 % (ref 11.5–15.5)
WBC: 18.2 10*3/uL — AB (ref 4.0–10.5)

## 2016-03-07 LAB — LIPASE, BLOOD: Lipase: 12 U/L (ref 11–51)

## 2016-03-07 LAB — URINE MICROSCOPIC-ADD ON

## 2016-03-07 LAB — TROPONIN I

## 2016-03-07 LAB — D-DIMER, QUANTITATIVE (NOT AT ARMC): D DIMER QUANT: 0.59 ug{FEU}/mL — AB (ref 0.00–0.50)

## 2016-03-07 MED ORDER — ONDANSETRON HCL 4 MG/2ML IJ SOLN
4.0000 mg | Freq: Four times a day (QID) | INTRAMUSCULAR | Status: DC
  Administered 2016-03-07 – 2016-03-09 (×9): 4 mg via INTRAVENOUS
  Filled 2016-03-07 (×9): qty 2

## 2016-03-07 MED ORDER — ASPIRIN 81 MG PO CHEW: 324.0000 mg | CHEWABLE_TABLET | Freq: Once | ORAL | Status: DC

## 2016-03-07 MED ORDER — PROMETHAZINE HCL 12.5 MG PO TABS
12.5000 mg | ORAL_TABLET | Freq: Four times a day (QID) | ORAL | Status: DC | PRN
  Administered 2016-03-08 – 2016-03-09 (×2): 12.5 mg via ORAL
  Filled 2016-03-07 (×2): qty 1

## 2016-03-07 MED ORDER — PANTOPRAZOLE SODIUM 40 MG IV SOLR
40.0000 mg | INTRAVENOUS | Status: DC
  Administered 2016-03-07 – 2016-03-09 (×3): 40 mg via INTRAVENOUS
  Filled 2016-03-07 (×3): qty 40

## 2016-03-07 MED ORDER — DULOXETINE HCL 60 MG PO CPEP
60.0000 mg | ORAL_CAPSULE | Freq: Every day | ORAL | Status: DC
  Administered 2016-03-07 – 2016-03-08 (×2): 60 mg via ORAL
  Filled 2016-03-07 (×2): qty 1

## 2016-03-07 MED ORDER — HEPARIN SODIUM (PORCINE) 5000 UNIT/ML IJ SOLN
5000.0000 [IU] | Freq: Three times a day (TID) | INTRAMUSCULAR | Status: DC
  Administered 2016-03-07 – 2016-03-09 (×7): 5000 [IU] via SUBCUTANEOUS
  Filled 2016-03-07 (×7): qty 1

## 2016-03-07 MED ORDER — PIPERACILLIN-TAZOBACTAM 3.375 G IVPB
3.3750 g | Freq: Three times a day (TID) | INTRAVENOUS | Status: DC
  Administered 2016-03-07 – 2016-03-09 (×7): 3.375 g via INTRAVENOUS
  Filled 2016-03-07 (×7): qty 50

## 2016-03-07 MED ORDER — METOCLOPRAMIDE HCL 5 MG/ML IJ SOLN
10.0000 mg | Freq: Once | INTRAMUSCULAR | Status: AC
  Administered 2016-03-07: 10 mg via INTRAVENOUS
  Filled 2016-03-07: qty 2

## 2016-03-07 MED ORDER — HYDRALAZINE HCL 20 MG/ML IJ SOLN
5.0000 mg | INTRAMUSCULAR | Status: DC | PRN
  Administered 2016-03-07: 5 mg via INTRAVENOUS
  Filled 2016-03-07: qty 1

## 2016-03-07 MED ORDER — HYDROMORPHONE HCL 1 MG/ML IJ SOLN
1.0000 mg | Freq: Once | INTRAMUSCULAR | Status: AC
  Administered 2016-03-07: 1 mg via INTRAVENOUS
  Filled 2016-03-07: qty 1

## 2016-03-07 MED ORDER — ACETAMINOPHEN 650 MG RE SUPP: 650.0000 mg | Freq: Four times a day (QID) | RECTAL | Status: DC | PRN

## 2016-03-07 MED ORDER — ONDANSETRON HCL 4 MG/2ML IJ SOLN
INTRAMUSCULAR | Status: AC
  Administered 2016-03-07: 4 mg via INTRAVENOUS
  Filled 2016-03-07: qty 2

## 2016-03-07 MED ORDER — HYDROMORPHONE HCL 1 MG/ML IJ SOLN
1.0000 mg | INTRAMUSCULAR | Status: DC | PRN
  Administered 2016-03-07 – 2016-03-09 (×10): 1 mg via INTRAVENOUS
  Filled 2016-03-07 (×10): qty 1

## 2016-03-07 MED ORDER — INSULIN ASPART 100 UNIT/ML ~~LOC~~ SOLN
0.0000 [IU] | Freq: Three times a day (TID) | SUBCUTANEOUS | Status: DC
  Administered 2016-03-07 – 2016-03-08 (×3): 1 [IU] via SUBCUTANEOUS

## 2016-03-07 MED ORDER — NITROGLYCERIN 0.4 MG SL SUBL
0.4000 mg | SUBLINGUAL_TABLET | SUBLINGUAL | Status: DC | PRN
  Administered 2016-03-07 (×2): 0.4 mg via SUBLINGUAL
  Filled 2016-03-07 (×2): qty 1

## 2016-03-07 MED ORDER — ALBUTEROL SULFATE (2.5 MG/3ML) 0.083% IN NEBU: 2.5000 mg | INHALATION_SOLUTION | RESPIRATORY_TRACT | Status: DC | PRN

## 2016-03-07 MED ORDER — METOPROLOL TARTRATE 5 MG/5ML IV SOLN
10.0000 mg | Freq: Four times a day (QID) | INTRAVENOUS | Status: DC
  Administered 2016-03-07 – 2016-03-08 (×3): 10 mg via INTRAVENOUS
  Filled 2016-03-07 (×3): qty 10

## 2016-03-07 MED ORDER — GABAPENTIN 300 MG PO CAPS
300.0000 mg | ORAL_CAPSULE | Freq: Two times a day (BID) | ORAL | Status: DC
  Administered 2016-03-07 – 2016-03-09 (×5): 300 mg via ORAL
  Filled 2016-03-07 (×5): qty 1

## 2016-03-07 MED ORDER — DIPHENHYDRAMINE HCL 50 MG/ML IJ SOLN
25.0000 mg | Freq: Once | INTRAMUSCULAR | Status: AC
  Administered 2016-03-07: 25 mg via INTRAVENOUS
  Filled 2016-03-07: qty 1

## 2016-03-07 MED ORDER — INSULIN ASPART 100 UNIT/ML ~~LOC~~ SOLN: 0.0000 [IU] | Freq: Every day | SUBCUTANEOUS | Status: DC

## 2016-03-07 MED ORDER — DIAZEPAM 5 MG/ML IJ SOLN: 2.5000 mg | Freq: Three times a day (TID) | INTRAMUSCULAR | Status: DC | PRN

## 2016-03-07 MED ORDER — METOPROLOL TARTRATE 5 MG/5ML IV SOLN
10.0000 mg | Freq: Once | INTRAVENOUS | Status: AC
  Administered 2016-03-07: 10 mg via INTRAVENOUS
  Filled 2016-03-07: qty 10

## 2016-03-07 MED ORDER — MORPHINE SULFATE (PF) 4 MG/ML IV SOLN
4.0000 mg | Freq: Once | INTRAVENOUS | Status: AC
  Administered 2016-03-07: 4 mg via INTRAVENOUS
  Filled 2016-03-07: qty 1

## 2016-03-07 MED ORDER — ACETAMINOPHEN 325 MG PO TABS
650.0000 mg | ORAL_TABLET | Freq: Four times a day (QID) | ORAL | Status: DC | PRN
  Filled 2016-03-07: qty 2

## 2016-03-07 MED ORDER — DICLOFENAC SODIUM 1 % TD GEL
4.0000 g | Freq: Three times a day (TID) | TRANSDERMAL | Status: DC
  Administered 2016-03-07 – 2016-03-09 (×7): 4 g via TOPICAL
  Filled 2016-03-07: qty 100

## 2016-03-07 MED ORDER — POTASSIUM CHLORIDE IN NACL 20-0.9 MEQ/L-% IV SOLN
INTRAVENOUS | Status: DC
  Administered 2016-03-07 – 2016-03-08 (×3): via INTRAVENOUS

## 2016-03-07 MED ORDER — ONDANSETRON HCL 4 MG/2ML IJ SOLN
4.0000 mg | Freq: Once | INTRAMUSCULAR | Status: AC
  Administered 2016-03-07: 4 mg via INTRAVENOUS

## 2016-03-07 NOTE — Progress Notes (Signed)
Report called to 300 rn. No questions or concerns regarding transfer.   Promiss Labarbera stophelRN

## 2016-03-07 NOTE — ED Notes (Signed)
Lab at bedside,

## 2016-03-07 NOTE — ED Provider Notes (Addendum)
CSN: [id number]     Arrival date & time 03/07/16  0545 History   First MD Initiated Contact with Patient 03/07/16 0550     Chief Complaint  Patient presents with  . Chest Pain     (Consider location/radiation/quality/duration/timing/severity/associated sxs/prior Treatment) Patient is a 65 y.o. female presenting with chest pain. The history is provided by the patient.  Chest Pain She has a history of COPD, hypertension, chronic pain. She noted onset at 2 AM of tightness around her chest which radiated into her back with associated nausea, vomiting, diaphoresis, dyspnea. She also had 4 episodes of diarrhea, but no longer has a sense of more diarrhea coming. Nothing made her symptoms better nothing made them worse. She rated her discomfort at 10/10. She was brought in by EMS who gave one nitroglycerin with no relief. Of note, she did have a recent hospitalization for chest discomfort and states that this feeling is different. She does have significant cardiac risk factors of tobacco use, hypertension. She states that she is prediabetic and was placed on metformin prophylactically, but I would consider this a risk factor as well. She denies history of hyperlipidemia. She quit smoking 1 week ago and was a 1 pack-a-day smoker.  Past Medical History  Diagnosis Date  . Arthritis   . Bronchitis   . Hypertension   . Chronic back pain   . Chronic knee pain   . COPD (chronic obstructive pulmonary disease) (HCC)   . Memory difficulty 2015  . Weakness of both legs   . 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 Pain management   . Frequent falls   . Dizziness    Past Surgical History  Procedure Laterality Date  . Knee cartilage surgery Right 2007  . Appendectomy  1971  . Tonsillectomy  1971   Family History  Problem Relation Age of Onset  . Heart failure Father   . Diabetes Father   . Kidney failure Father    Social History   Substance Use Topics  . Smoking status: Current Every Day Smoker -- 0.50 packs/day for 10 years    Types: Cigarettes    Start date: 08/05/1970  . Smokeless tobacco: Never Used     Comment: 04/30/15 less than 1 PPD  . Alcohol Use: 0.0 oz/week    0 Standard drinks or equivalent per week     Comment: Very rare   OB History    Gravida Para Term Preterm AB TAB SAB Ectopic Multiple Living   Review of Systems  Cardiovascular: Positive for chest pain.  All other systems reviewed and are negative.     Allergies  Bee venom; Mushroom extract complex; Amitriptyline; Toradol; Tramadol; and Trazodone and nefazodone  Home Medications   Prior to Admission medications   Medication Sig Start Date End Date Taking? Authorizing Provider  albuterol (PROVENTIL HFA;VENTOLIN HFA) 108 (90 BASE) MCG/ACT inhaler Inhale 2 puffs into the lungs every 6 (six) hours as needed for wheezing. 06/21/13   Nimish Normajean Glasgow, MD  amLODipine (NORVASC) 2.5 MG tablet Take 2 tablets (5 mg total) by mouth daily. 06/30/15   Otis Brace, MD  aspirin EC 81 MG tablet Take 1 tablet (81 mg total) by mouth daily. 12/22/15   Henderson Cloud, MD  Cholecalciferol (VITAMIN D) 2000 units CAPS Take 1 capsule by mouth daily.    Historical Provider, MD  cyclobenzaprine (FLEXERIL) 10 MG tablet Take 10 mg by mouth 3 (three) times daily as needed for muscle spasms.    Historical Provider, MD  diazepam (VALIUM) 5 MG tablet Take 5 mg by mouth 3 (three) times daily as needed for anxiety.    Historical Provider, MD  DULoxetine (CYMBALTA) 60 MG capsule Take 60 mg by mouth at bedtime.     Historical Provider, MD  EVZIO 0.4 MG/0.4ML SOAJ Inject 0.4 mLs into the muscle once as needed (for Anaphylaxis/allergic reaction).  02/19/15   Historical Provider, MD  gabapentin (NEURONTIN) 300 MG capsule Take 300 mg by mouth 3 (three) times daily.    Historical Provider, MD  HYDROcodone-acetaminophen (NORCO/VICODIN) 5-325 MG tablet  Take 1-2 tablets by mouth every 4 (four) hours as needed. 02/02/16   Donnetta Hutching, MD  metFORMIN (GLUCOPHAGE-XR) 500 MG 24 hr tablet Take 500 mg by mouth at bedtime.  12/15/14   Historical Provider, MD  metoprolol succinate (TOPROL-XL) 50 MG 24 hr tablet Take 50 mg by mouth at bedtime. 12/14/15   Historical Provider, MD  omeprazole (PRILOSEC) 20 MG capsule Take 1 capsule (20 mg total) by mouth daily. 06/30/15   Otis Brace, MD  oxyCODONE-acetaminophen (PERCOCET) 10-325 MG tablet Take 1 tablet by mouth every 6 (six) hours as needed for pain.    Historical Provider, MD  predniSONE (DELTASONE) 50 MG tablet 1 tablet for 6 days, one half tablet for 6 days 02/02/16   Donnetta Hutching, MD  VOLTAREN 1 % GEL Apply 4 g topically every 8 (eight) hours.  02/06/15   Historical Provider, MD   BP 186/87 mmHg  Pulse 63  Temp(Src) 97.6 F (36.4 C) (Oral)  Resp 24  Ht 5\' 4"  (1.626 m)  Wt 227 lb (102.967 kg)  BMI 38.95 kg/m2  SpO2 98% Physical Exam  Nursing note and vitals reviewed.  65 year old female, resting comfortably and in no acute distress. Vital signs are significant for hypertension and tachypnea. Oxygen saturation is 98%, which is normal. Head is normocephalic and atraumatic. PERRLA, EOMI. Oropharynx is clear. Neck is nontender and supple without adenopathy or JVD. Back is nontender and there is no CVA tenderness. Lungs are clear without rales, wheezes, or rhonchi. Chest is nontender. Heart has regular rate and rhythm without murmur. Abdomen is soft, flat, nontender without masses or hepatosplenomegaly and peristalsis is normoactive. Extremities have trace edema, full range of motion is present. Skin is warm and dry without rash. Neurologic: Mental status is normal, cranial nerves are intact, there are no motor or sensory deficits.  ED Course  Procedures (including critical care time) Labs Review Results for orders placed or performed during the hospital encounter of 03/07/16  Comprehensive metabolic  panel  Result Value Ref Range   Sodium 135 135 - 145 mmol/L   Potassium 3.5 3.5 - 5.1 mmol/L   Chloride 101 101 - 111 mmol/L   CO2 24 22 - 32 mmol/L   Glucose, Bld 140 (H) 65 - 99 mg/dL   BUN 15 6 - 20 mg/dL   Creatinine, Ser 1.61 0.44 - 1.00 mg/dL   Calcium 9.1 8.9 - 09.6 mg/dL   Total Protein 7.4 6.5 - 8.1 g/dL   Albumin 4.1 3.5 - 5.0 g/dL   AST 17 15 - 41 U/L   ALT 13 (L) 14 - 54 U/L   Alkaline Phosphatase 95 38 - 126 U/L   Total Bilirubin 0.4 0.3 - 1.2 mg/dL   GFR calc non Af Amer >60 >60 mL/min  GFR calc Af Amer >60 >60 mL/min   Anion gap 10 5 - 15  Lipase, blood  Result Value Ref Range   Lipase 12 11 - 51 U/L  CBC with Differential  Result Value Ref Range   WBC 18.2 (H) 4.0 - 10.5 K/uL   RBC 5.19 (H) 3.87 - 5.11 MIL/uL   Hemoglobin 15.6 (H) 12.0 - 15.0 g/dL   HCT 22.0 (H) 25.4 - 27.0 %   MCV 91.1 78.0 - 100.0 fL   MCH 30.1 26.0 - 34.0 pg   MCHC 33.0 30.0 - 36.0 g/dL   RDW 62.3 76.2 - 83.1 %   Platelets 284 150 - 400 K/uL   Neutrophils Relative % 67 %   Neutro Abs 12.2 (H) 1.7 - 7.7 K/uL   Lymphocytes Relative 25 %   Lymphs Abs 4.5 (H) 0.7 - 4.0 K/uL   Monocytes Relative 7 %   Monocytes Absolute 1.3 (H) 0.1 - 1.0 K/uL   Eosinophils Relative 1 %   Eosinophils Absolute 0.3 0.0 - 0.7 K/uL   Basophils Relative 0 %   Basophils Absolute 0.0 0.0 - 0.1 K/uL  Troponin I  Result Value Ref Range   Troponin I <0.03 <0.031 ng/mL   Imaging Review Dg Chest Port 1 View  03/07/2016  CLINICAL DATA:  Chest discomfort.  Shortness of breath. EXAM: PORTABLE CHEST 1 VIEW COMPARISON:  01/10/2016 FINDINGS: Progressive bronchitic change from prior. Increased infrahilar atelectasis bilaterally. Cardiomediastinal contours are unchanged. No confluent airspace disease, large pleural effusion or pneumothorax. IMPRESSION: Increased bronchitic change and infrahilar atelectasis. Electronically Signed   By: Rubye Oaks M.D.   On: 03/07/2016 06:26   I have personally reviewed and evaluated  these images and lab results as part of my medical decision-making.   EKG Interpretation   Date/Time:  Friday Mar 07 2016 05:52:10 EDT Ventricular Rate:  83 PR Interval:  155 QRS Duration: 86 QT Interval:  381 QTC Calculation: 448 R Axis:   61 Text Interpretation:  Sinus rhythm Low voltage, precordial leads Probable  anteroseptal infarct, old When compared with ECG of 01/10/2016, No  significant change was found Confirmed by [facility]  MD, Channon Ambrosini ([id number]) on  03/07/2016 5:55:11 AM      MDM   Final diagnoses:  Chest pain, unspecified  Epigastric pain  Leukocytosis    Chest discomfort which is certainly worrisome for cardiac origin. Need to consider possibility of GERD, peptic ulcer disease, pancreatitis, etc. I reviewed her old records and she had a recent hospitalization for an elevated troponin on with normal echocardiogram. Cardiology note states that she has an elevated cholesterol and should be on statin therapy although patient denies this. ECG shows no changes from prior tracing. She will be given additional nitroglycerin and screening labs obtained including troponin.  Troponin has come back normal. Patient had no relief of pain with additional nitroglycerin. She was given 2 doses of morphine with no relief of pain. On reexam, she now is demonstrating significant epigastric and right upper quadrant tenderness and is sent for ultrasound of her gallbladder. She will be given hydromorphone to try to control pain. Workup is significant for leukocytosis of 18.2. I anticipate she will need to be admitted for serial troponins. Case is Discussed with Dr. Sherrie Mustache, [facility], who agrees to admit the patientis status. She does request a d-dimer be obtained.    [name], MD 03/07/16 6073  [name], MD 03/07/16 [phone]

## 2016-03-07 NOTE — H&P (Addendum)
History and Physical    Carla [name]:[id number] DOB: 1951/04/21 DOA: 03/07/2016  PCP: [name], MD  Patient coming from: Home  Chief Complaint: Abdominal pain, nausea, vomiting, diarrhea.  HPI: Carla [name] is a 65 y.o. 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 Her symptoms started at 2 AM this morning. She describes the pain as a tightness around her upper abdomen with radiation to her left upper chest and right upper chest. The pain is rated as a 10 over 10 in intensity. She has had intermittent shortness of breath. She also developed nausea, vomiting, chills, and loose stools. She reports vomiting more than 10 times and having 4 or 5 loose stools. She reports seeing small amount of blood in her emesis. She denies black tarry stools or bright red blood per rectum. She denies pain with urination. She took an over-the-counter antinausea medication, but it did not help. She took her chronic pain medications, but she was unable to keep them down.  ED Course: In the ED, she was afebrile and hypertensive. Her lab data were significant for a WBC of 18.2, hemoglobin of 15.6, normal troponin I, normal lipase, normal LFTs, and a glucose of 140. Her chest x-ray revealed increased bronchitic changes and infra hilar atelectasis. Later, ultrasound of her abdomen was ordered and revealed possible acute cholecystitis due to calculi and wall thickening and hepatic steatosis. She is being admitted for further evaluation and management.  Review of Systems: As per HPI; in addition, she has chronic pain in her shoulders, lower back, and knees; she has some pain and numbness in her extremities with ambulation; she has occasional  dizziness; she has some difficulty with her memory at times; otherwise review systems is negative.   Past Medical History  Diagnosis Date  . Arthritis   . Bronchitis   . Hypertension   . Chronic back pain   . Chronic knee pain   . COPD (chronic obstructive pulmonary disease) (HCC)   . Memory difficulty 2015  . Weakness of both legs   . 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 Pain management   . Frequent falls   . Dizziness   . 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 Prediabetes   . 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 Knee cartilage surgery Right 2007  . Appendectomy  1971  . Tonsillectomy  1971    Social history: She is widowed. She lives with her daughter, husband, and grandchildren in [place]. She generally ambulates with a cane or walker. She no longer drives. She stopped smoking cigarettes last week. She denies alcohol and drug use.  She started smoking about 45 years ago. She has a 5 pack-year smoking history. She has never used smokeless tobacco. She reports that she drinks alcohol. She reports that she does not use illicit drugs.  Allergies  Allergen Reactions  . Bee Venom Anaphylaxis  . Mushroom Extract Complex Anaphylaxis    Extreme sweating, vomiting  . Amitriptyline Other (See Comments)    Has nightmares when using, not in right state of mind   . Toradol [Ketorolac Tromethamine] Nausea And Vomiting  . Tramadol Nausea And Vomiting  .  Trazodone And Nefazodone Nausea And Vomiting    Family History  Problem Relation Age of Onset  . Heart failure Father   . Diabetes Father   . Kidney failure Father      Prior to Admission medications   Medication Sig Start Date End Date Taking? Authorizing Provider  albuterol (PROVENTIL HFA;VENTOLIN HFA) 108 (90 BASE) MCG/ACT inhaler Inhale 2 puffs into the lungs  every 6 (six) hours as needed for wheezing. 06/21/13   Nimish Normajean Glasgow, MD  amLODipine (NORVASC) 2.5 MG tablet Take 2 tablets (5 mg total) by mouth daily. 06/30/15   Otis Brace, MD  aspirin EC 81 MG tablet Take 1 tablet (81 mg total) by mouth daily. 12/22/15   Henderson Cloud, MD  Cholecalciferol (VITAMIN D) 2000 units CAPS Take 1 capsule by mouth daily.    Historical Provider, MD  cyclobenzaprine (FLEXERIL) 10 MG tablet Take 10 mg by mouth 3 (three) times daily as needed for muscle spasms.    Historical Provider, MD  diazepam (VALIUM) 5 MG tablet Take 5 mg by mouth 3 (three) times daily as needed for anxiety.    Historical Provider, MD  DULoxetine (CYMBALTA) 60 MG capsule Take 60 mg by mouth at bedtime.     Historical Provider, MD  EVZIO 0.4 MG/0.4ML SOAJ Inject 0.4 mLs into the muscle once as needed (for Anaphylaxis/allergic reaction).  02/19/15   Historical Provider, MD  gabapentin (NEURONTIN) 300 MG capsule Take 300 mg by mouth 3 (three) times daily.    Historical Provider, MD  HYDROcodone-acetaminophen (NORCO/VICODIN) 5-325 MG tablet Take 1-2 tablets by mouth every 4 (four) hours as needed. 02/02/16   Donnetta Hutching, MD  metFORMIN (GLUCOPHAGE-XR) 500 MG 24 hr tablet Take 500 mg by mouth at bedtime.  12/15/14   Historical Provider, MD  metoprolol succinate (TOPROL-XL) 50 MG 24 hr tablet Take 50 mg by mouth at bedtime. 12/14/15   Historical Provider, MD  omeprazole (PRILOSEC) 20 MG capsule Take 1 capsule (20 mg total) by mouth daily. 06/30/15   Otis Brace, MD  oxyCODONE-acetaminophen (PERCOCET) 10-325 MG tablet Take 1 tablet by mouth every 6 (six) hours as needed for pain.    Historical Provider, MD  predniSONE (DELTASONE) 50 MG tablet 1 tablet for 6 days, one half tablet for 6 days 02/02/16   Donnetta Hutching, MD  VOLTAREN 1 % GEL Apply 4 g topically every 8 (eight) hours.  02/06/15   Historical Provider, MD    Physical Exam: Filed Vitals:   03/07/16 0819 03/07/16 0900 03/07/16 0945 03/07/16 1000    BP: 165/78 163/113 171/151   Pulse: 65 66 64 72  Temp: 98 F (36.7 C)     TempSrc: Oral     Resp: 18 22 21 15   Height:      Weight:      SpO2: 95% 96% 100% 100%      Constitutional:Obese 65 year old Caucasian woman who appears ill, but in no acute distress. Filed Vitals:   03/07/16 0819 03/07/16 0900 03/07/16 0945 03/07/16 1000  BP: 165/78 163/113 171/151   Pulse: 65 66 64 72  Temp: 98 F (36.7 C)     TempSrc: Oral     Resp: 18 22 21 15   Height:      Weight:      SpO2: 95% 96% 100% 100%   Eyes: PERRL, lids and conjunctivae normal ENMT: Mucous membranes are dry. Posterior pharynx clear of any exudate or lesions.Normal dentition.  Neck: normal, supple, no masses, no thyromegaly  Respiratory: Coarse breath sounds, but no audible wheezes or crackles Normal respiratory effort. No accessory muscle use.  Cardiovascular: Regular rate and rhythm with a soft systolic murmur. No extremity edema. 2+ pedal pulses.   Abdomen: Positive bowel sounds, obese, soft, moderately tender epigastrium and right upper quadrant with positive Murphy sign; no appreciable hepatosplenomegaly; no masses palpated . Musculoskeletal: no clubbing / cyanosis. No joint deformity upper and lower extremities. No contractures. Normal muscle tone.  Skin: no rashes, lesions, ulcers. No induration Neurologic: CN 2-12 grossly intact. Sensation intact, Strength 5-/5 in all 4.  Psychiatric: Alert and oriented x 3. Slightly anxious mood. Speech is clear.   Labs on Admission: I have personally reviewed following labs and imaging studies  CBC:  Recent Labs Lab 03/07/16 0634  WBC 18.2*  NEUTROABS 12.2*  HGB 15.6*  HCT 47.3*  MCV 91.1  PLT 284   Basic Metabolic Panel:  Recent Labs Lab 03/07/16 0634  NA 135  K 3.5  CL 101  CO2 24  GLUCOSE 140*  BUN 15  CREATININE 0.65  CALCIUM 9.1   GFR: Estimated Creatinine Clearance: 85.2 mL/min (by C-G formula based on Cr of 0.65). Liver Function Tests:  Recent  Labs Lab 03/07/16 0634  AST 17  ALT 13*  ALKPHOS 95  BILITOT 0.4  PROT 7.4  ALBUMIN 4.1    Recent Labs Lab 03/07/16 0634  LIPASE 12   No results for input(s): AMMONIA in the last 168 hours. Coagulation Profile: No results for input(s): INR, PROTIME in the last 168 hours. Cardiac Enzymes:  Recent Labs Lab 03/07/16 0634  TROPONINI <0.03   BNP (last 3 results) No results for input(s): PROBNP in the last 8760 hours. HbA1C: No results for input(s): HGBA1C in the last 72 hours. CBG: No results for input(s): GLUCAP in the last 168 hours. Lipid Profile: No results for input(s): CHOL, HDL, LDLCALC, TRIG, CHOLHDL, LDLDIRECT in the last 72 hours. Thyroid Function Tests: No results for input(s): TSH, T4TOTAL, FREET4, T3FREE, THYROIDAB in the last 72 hours. Anemia Panel: No results for input(s): VITAMINB12, FOLATE, FERRITIN, TIBC, IRON, RETICCTPCT in the last 72 hours. Urine analysis:    Component Value Date/Time   COLORURINE YELLOW 01/10/2016 1930   APPEARANCEUR CLEAR 01/10/2016 1930   LABSPEC 1.020 01/10/2016 1930   PHURINE 5.0 01/10/2016 1930   GLUCOSEU NEGATIVE 01/10/2016 1930   HGBUR NEGATIVE 01/10/2016 1930   BILIRUBINUR NEGATIVE 01/10/2016 1930   KETONESUR NEGATIVE 01/10/2016 1930   PROTEINUR NEGATIVE 01/10/2016 1930   UROBILINOGEN 0.2 06/28/2015 1940   NITRITE NEGATIVE 01/10/2016 1930   LEUKOCYTESUR NEGATIVE 01/10/2016 1930   Sepsis Labs: !!!!!!!!!!!!!!!!!!!!!!!!!!!!!!!!!!!!!!!!!!!! @LABRCNTIP (procalcitonin:4,lacticidven:4) )No results found for this or any previous visit (from the past 240 hour(s)).   Radiological Exams on Admission: US Abdomen Complete  03/07/2016  CLINICAL DATA:  Abdominal pain with nausea, vomiting, and diarrhea since 2 a.m. EXAM: ABDOMEN ULTRASOUND COMPLETE COMPARISON:  Renal ultrasound 06/30/2015 FINDINGS: Gallbladder: Gallstones including a large stone in the neck. The gallbladder is full. Reportedly no focal tenderness but medicated.  Mild wall thickening (up to 6 mm) and striation. Common bile duct: Diameter: 5 mm Liver: Echogenic with poor acoustic penetration. No focal lesion. Antegrade flow in the imaged hepatic and portal venous system. IVC: No abnormality visualized. Pancreas: Visualized portion unremarkable. Spleen: Size and appearance within normal limits. Right Kidney: Length: 13.6 cm. Echogenicity within normal limits. No mass or hydronephrosis visualized. Left Kidney: Length: 13 cm. Echogenicity within normal limits. No mass or hydronephrosis visualized. Abdominal aorta: Obscured  by bowel gas. No visible aneurysm on 10/23/2015 lumbar spine MRI. Other findings: Difficult study due to patient's size and bowel gas. IMPRESSION: 1. Possible acute cholecystitis due to calculi and wall thickening. No sonographic Murphy's sign, but potentially affected by analgesic medication. 2. Hepatic steatosis. Electronically Signed   By: Marnee Spring M.D.   On: 03/07/2016 08:27   Dg Chest Port 1 View  03/07/2016  CLINICAL DATA:  Chest discomfort.  Shortness of breath. EXAM: PORTABLE CHEST 1 VIEW COMPARISON:  01/10/2016 FINDINGS: Progressive bronchitic change from prior. Increased infrahilar atelectasis bilaterally. Cardiomediastinal contours are unchanged. No confluent airspace disease, large pleural effusion or pneumothorax. IMPRESSION: Increased bronchitic change and infrahilar atelectasis. Electronically Signed   By: Rubye Oaks M.D.   On: 03/07/2016 06:26    EKG: Independently reviewed. Normal sinus rhythm with no worrisome ST or T-wave abnormalities.  Assessment/Plan Principal Problem:   Epigastric abdominal pain Active Problems:   Cholecystitis, acute   Cholelithiasis   COPD (chronic obstructive pulmonary disease) (HCC)   Essential hypertension   Spinal stenosis, lumbar region, with neurogenic claudication   Depression with pseudodementia   Chronic pain syndrome   Prediabetes   Obesity, morbid (HCC)    This is a  65 year old woman with chronic pain syndrome, obesity, hypertension, and diabetes, who presents with epigastric abdominal pain which radiates to her chest, nausea, vomiting, and loose stools. She recently had an unremarkable Myoview stress test 2 months ago. On exam, she has a positive Murphy sign and moderate epigastric and right upper quadrant abdominal pain. Her LFTs and lipase are within normal limits. Her white blood cell count is elevated, possibly secondary to acute cholecystitis. Urinalysis is pending, but her chest x-ray reveals no evidence of pneumonia. Ultrasound of her abdomen is suspicious for acute cholecystitis. Of note, she does have a daughter who also had cholecystitis, status post cholecystectomy.  Plan: 1. The patient will be made virtually nothing by mouth with exception of sips of clear liquids with limited oral medications. Will start IV fluid hydration. 2. We'll start scheduled IV Zofran and add when necessary IV Phenergan. Will start IV Protonix empirically. 3. Will treat her pain with as needed IV hydromorphone. 4. Will limit her oral medications to gabapentin and Cymbalta. Will give diazepam and metoprolol IV. Will treat her prediabetes with sliding scale NovoLog. 5. Will consult general surgery.     DVT prophylaxis: Subcutaneous heparin Code Status: Full code Family Communication: Family not available Disposition Plan: Anticipate discharge to home in several days Consults called: General surgery pending Admission status: Inpatient to telemetry   [facility] MD [facility] Pager [phone]  If 7PM-7AM, please contact night-coverage [email or web address] Password [email or web address]  03/07/2016, 10:26 AM

## 2016-03-07 NOTE — Consult Note (Signed)
Reason for Consult: Right upper quadrant abdominal pain Referring Physician: Dr. Crista Curb Carla [name] is an 65 y.o. female.  HPI: Patient is a 65 year old white female who presented emergency room earlier today with acute onset of upper abdominal pain. She states she has not had this pain before. She does state that she had a family member who had her gallbladder out due to similar abdominal pain. When I asked her where her pain started, she points to the left upper quadrant. She states it radiates across her abdomen. When I walked in the room initially, she was asleep. She states this is her first episode. She denies any nausea. No history of diarrhea, constipation, fever, chills, or jaundice.  Past Medical History  Diagnosis Date  . Arthritis   . Bronchitis   . Hypertension   . Chronic back pain   . Chronic knee pain   . COPD (chronic obstructive pulmonary disease) ([place])   . Memory difficulty 2015  . Weakness of both legs   . 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 Pain management   . Frequent falls   . Dizziness   . 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 Prediabetes   . 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 Knee cartilage surgery Right 2007  . Appendectomy  1971  . Tonsillectomy  1971    Family History  Problem Relation Age of Onset  . Heart failure Father   . Diabetes Father   . Kidney failure Father     Social History:  reports that she has been smoking Cigarettes.  She started smoking about 45 years ago. She has a 5 pack-year smoking history. She has never used smokeless tobacco. She reports that she drinks alcohol. She reports that she does not use illicit drugs.  Allergies:  Allergies  Allergen Reactions  . Bee Venom Anaphylaxis  . Mushroom Extract Complex Anaphylaxis    Extreme sweating, vomiting  .  Amitriptyline Other (See Comments)    Has nightmares when using, not in right state of mind   . Toradol [Ketorolac Tromethamine] Nausea And Vomiting  . Tramadol Nausea And Vomiting  . Trazodone And Nefazodone Nausea And Vomiting    Medications:  Prior to Admission:  Prescriptions prior to admission  Medication Sig Dispense Refill Last Dose  . albuterol (PROVENTIL HFA;VENTOLIN HFA) 108 (90 BASE) MCG/ACT inhaler Inhale 2 puffs into the lungs every 6 (six) hours as needed for wheezing. 1 Inhaler 2 unknown  . amLODipine (NORVASC) 2.5 MG tablet Take 2 tablets (5 mg total) by mouth daily.   03/06/2016 at Unknown time  . aspirin EC 81 MG tablet Take 1 tablet (81 mg total) by mouth daily.   03/06/2016 at Unknown time  . Cholecalciferol (VITAMIN D) 2000 units CAPS Take 1 capsule by mouth daily.   03/06/2016 at Unknown time  . cyclobenzaprine (FLEXERIL) 10 MG tablet Take 10 mg by mouth 3 (three) times daily as needed for muscle spasms.   Past Week at Unknown time  . diazepam (VALIUM) 5 MG tablet Take 5 mg by mouth 3 (three) times daily as needed for anxiety.   Past Week at Unknown time  . DULoxetine (CYMBALTA) 60 MG capsule Take 60 mg by mouth at bedtime.    03/06/2016 at Unknown time  . EVZIO 0.4 MG/0.4ML SOAJ Inject 0.4 mLs into the muscle once as needed (for  Anaphylaxis/allergic reaction).    unknown  . gabapentin (NEURONTIN) 300 MG capsule Take 300 mg by mouth 3 (three) times daily.   03/06/2016 at Unknown time  . hydrOXYzine (VISTARIL) 25 MG capsule Take 25 mg by mouth at bedtime.     . metoprolol succinate (TOPROL-XL) 50 MG 24 hr tablet Take 50 mg by mouth at bedtime.   03/06/2016 at Unknown time  . omeprazole (PRILOSEC) 20 MG capsule Take 1 capsule (20 mg total) by mouth daily. 30 capsule 0 03/06/2016 at Unknown time  . oxyCODONE-acetaminophen (PERCOCET) 10-325 MG tablet Take 1 tablet by mouth every 6 (six) hours as needed for pain.   Past Week at Unknown time  . VOLTAREN 1 % GEL Apply 4 g topically  every 8 (eight) hours.    Past Month at Unknown time  . metFORMIN (GLUCOPHAGE-XR) 500 MG 24 hr tablet Take 500 mg by mouth at bedtime.    unknown   Scheduled: . diclofenac sodium  4 g Topical Q8H  . DULoxetine  60 mg Oral QHS  . gabapentin  300 mg Oral BID  . heparin  5,000 Units Subcutaneous Q8H  . insulin aspart  0-5 Units Subcutaneous QHS  . insulin aspart  0-9 Units Subcutaneous TID WC  . metoprolol  10 mg Intravenous Q6H  . ondansetron (ZOFRAN) IV  4 mg Intravenous Q6H  . pantoprazole (PROTONIX) IV  40 mg Intravenous Q24H    Results for orders placed or performed during the hospital encounter of 03/07/16 (from the past 48 hour(s))  Comprehensive metabolic panel     Status: Abnormal   Collection Time: 03/07/16  6:34 AM  Result Value Ref Range   Sodium 135 135 - 145 mmol/L   Potassium 3.5 3.5 - 5.1 mmol/L   Chloride 101 101 - 111 mmol/L   CO2 24 22 - 32 mmol/L   Glucose, Bld 140 (H) 65 - 99 mg/dL   BUN 15 6 - 20 mg/dL   Creatinine, Ser 0.65 0.44 - 1.00 mg/dL   Calcium 9.1 8.9 - 10.3 mg/dL   Total Protein 7.4 6.5 - 8.1 g/dL   Albumin 4.1 3.5 - 5.0 g/dL   AST 17 15 - 41 U/L   ALT 13 (L) 14 - 54 U/L   Alkaline Phosphatase 95 38 - 126 U/L   Total Bilirubin 0.4 0.3 - 1.2 mg/dL   GFR calc non Af Amer >60 >60 mL/min   GFR calc Af Amer >60 >60 mL/min    Comment: (NOTE) The eGFR has been calculated using the CKD EPI equation. This calculation has not been validated in all clinical situations. eGFR's persistently <60 mL/min signify possible Chronic Kidney Disease.    Anion gap 10 5 - 15  Lipase, blood     Status: None   Collection Time: 03/07/16  6:34 AM  Result Value Ref Range   Lipase 12 11 - 51 U/L  CBC with Differential     Status: Abnormal   Collection Time: 03/07/16  6:34 AM  Result Value Ref Range   WBC 18.2 (H) 4.0 - 10.5 K/uL   RBC 5.19 (H) 3.87 - 5.11 MIL/uL   Hemoglobin 15.6 (H) 12.0 - 15.0 g/dL   HCT 47.3 (H) 36.0 - 46.0 %   MCV 91.1 78.0 - 100.0 fL   MCH  30.1 26.0 - 34.0 pg   MCHC 33.0 30.0 - 36.0 g/dL   RDW 14.5 11.5 - 15.5 %   Platelets 284 150 - 400 K/uL   Neutrophils  Relative % 67 %   Neutro Abs 12.2 (H) 1.7 - 7.7 K/uL   Lymphocytes Relative 25 %   Lymphs Abs 4.5 (H) 0.7 - 4.0 K/uL   Monocytes Relative 7 %   Monocytes Absolute 1.3 (H) 0.1 - 1.0 K/uL   Eosinophils Relative 1 %   Eosinophils Absolute 0.3 0.0 - 0.7 K/uL   Basophils Relative 0 %   Basophils Absolute 0.0 0.0 - 0.1 K/uL  Troponin I     Status: None   Collection Time: 03/07/16  6:34 AM  Result Value Ref Range   Troponin I <0.03 <0.031 ng/mL    Comment:        NO INDICATION OF MYOCARDIAL INJURY.   D-dimer, quantitative     Status: Abnormal   Collection Time: 03/07/16  6:48 AM  Result Value Ref Range   D-Dimer, Quant 0.59 (H) 0.00 - 0.50 ug/mL-FEU    Comment: (NOTE) At the manufacturer cut-off of 0.50 ug/mL FEU, this assay has been documented to exclude PE with a sensitivity and negative predictive value of 97 to 99%.  At this time, this assay has not been approved by the FDA to exclude DVT/VTE. Results should be correlated with clinical presentation.   Glucose, capillary     Status: Abnormal   Collection Time: 03/07/16 12:17 PM  Result Value Ref Range   Glucose-Capillary 145 (H) 65 - 99 mg/dL    US Abdomen Complete  03/07/2016  CLINICAL DATA:  Abdominal pain with nausea, vomiting, and diarrhea since 2 a.m. EXAM: ABDOMEN ULTRASOUND COMPLETE COMPARISON:  Renal ultrasound 06/30/2015 FINDINGS: Gallbladder: Gallstones including a large stone in the neck. The gallbladder is full. Reportedly no focal tenderness but medicated. Mild wall thickening (up to 6 mm) and striation. Common bile duct: Diameter: 5 mm Liver: Echogenic with poor acoustic penetration. No focal lesion. Antegrade flow in the imaged hepatic and portal venous system. IVC: No abnormality visualized. Pancreas: Visualized portion unremarkable. Spleen: Size and appearance within normal limits. Right Kidney:  Length: 13.6 cm. Echogenicity within normal limits. No mass or hydronephrosis visualized. Left Kidney: Length: 13 cm. Echogenicity within normal limits. No mass or hydronephrosis visualized. Abdominal aorta: Obscured by bowel gas. No visible aneurysm on 10/23/2015 lumbar spine MRI. Other findings: Difficult study due to patient's size and bowel gas. IMPRESSION: 1. Possible acute cholecystitis due to calculi and wall thickening. No sonographic Murphy's sign, but potentially affected by analgesic medication. 2. Hepatic steatosis. Electronically Signed   By: Monte Fantasia M.D.   On: 03/07/2016 08:27   Dg Chest Port 1 View  03/07/2016  CLINICAL DATA:  Chest discomfort.  Shortness of breath. EXAM: PORTABLE CHEST 1 VIEW COMPARISON:  01/10/2016 FINDINGS: Progressive bronchitic change from prior. Increased infrahilar atelectasis bilaterally. Cardiomediastinal contours are unchanged. No confluent airspace disease, large pleural effusion or pneumothorax. IMPRESSION: Increased bronchitic change and infrahilar atelectasis. Electronically Signed   By: Jeb Levering M.D.   On: 03/07/2016 06:26    ROS:  Pertinent items noted in HPI and remainder of comprehensive ROS otherwise negative.  Blood pressure 187/109, pulse 53, temperature 97 F (36.1 C), temperature source Oral, resp. rate 16, height '5\' 4"'  (1.626 m), weight 102.967 kg (227 lb), SpO2 96 %. Physical Exam: Pleasant white female who was not in any acute distress. Head reveals normal cephalic, atraumatic HEENT examination reveals no scleral icterus. Neck is supple without lymphadenopathy. Lungs: Auscultation with breath sounds bilaterally. Heart examination reveals regular rate and rhythm without S3, S4, murmurs. Abdomen is soft with migratory  tenderness present. No tenderness is noted. No rigidity is noted. No hepatosplenomegaly, masses, or rigidity are noted.  Assessment/Plan: Impression: Cholelithiasis, though it is difficult to a certain whether  this is the cause of her upper abdominal pain and leukocytosis. Her liver enzyme tests within normal limits. She is being cultured up for UTI. I will start her on Zosyn given the ultrasound findings, though they are somewhat equivocable compared to her physical exam. We'll follow with you. No need for acute surgical intervention at this time.  Kamarri Fischetti A 03/07/2016, 1:01 PM

## 2016-03-07 NOTE — ED Notes (Addendum)
Pt was given 324 mg aspirin and one nitro while enroute to er by ems with no change in pain,

## 2016-03-07 NOTE — ED Notes (Signed)
Pt up to bedside commode with assist.  C/o pain all over.  States no specific site but that she usually takes pain medications at home and has not had them.  Advised pt she has had 2 doses of Morphine to aid with pain.

## 2016-03-07 NOTE — ED Notes (Signed)
Pt states she awoke approx 0200 with vomiting, diarrhea and chest discomfort.

## 2016-03-07 NOTE — ED Notes (Signed)
Dr Preston Fleeting notified of medication having no relief in pt's pain,

## 2016-03-07 NOTE — Progress Notes (Signed)
Initial Nutrition Assessment  DOCUMENTATION CODES:  Obesity unspecified  INTERVENTION:  Monitor PO intake once diet is advanced and add snacks, supplement or educated as warranted  NUTRITION DIAGNOSIS:  Inadequate oral intake related to poor appetite as evidenced by per patient/family report.  GOAL:  Patient will meet greater than or equal to 90% of their needs  MONITOR:  Diet advancement, Labs, PO intake, I & O's  REASON FOR ASSESSMENT:  Malnutrition Screening Tool    ASSESSMENT:  65 y/o female PMHx HTN, depression/anxiety, COPD, pseudodementia, pre DM, Frequent falls, chronic pain. Presented with severe epigastric pain early this morning. Also w/ SOB, N/V/D. Ultrasound shows possible acute cholecystitis and hepatic steatosis.   Pt is a very poor historian vs being under heavy influence of pain medication. On admission, screened as having wt loss, but no trouble eating or poor appetite. However, when spoke with her she does endorse poor appetite x3 months. No n/v/c/d associated with this loss of appetite. She took a Vit D supplement at home.   She says she lost 27 lbs because of her poor appetite and says Her normal weight is 248-253 lbs. Her reported wt history does not reflect documentation and she actually appears to be at her UBW.   Will monitor PO intake as diet is advanced.   Labs reviewed:18.2  NFPE: WDL   Recent Labs Lab 03/07/16 0634  NA 135  K 3.5  CL 101  CO2 24  BUN 15  CREATININE 0.65  CALCIUM 9.1  GLUCOSE 140*    Diet Order:  Diet NPO time specified Except for: Sips with Meds  Skin:  Reviewed, no issues  Last BM:  5/26  Height:  Ht Readings from Last 1 Encounters:  03/07/16 5\' 4"  (1.626 m)    Weight:  Wt Readings from Last 1 Encounters:  03/07/16 227 lb (102.967 kg)   Wt Readings from Last 10 Encounters:  03/07/16 227 lb (102.967 kg)  02/02/16 220 lb (99.791 kg)  01/16/16 227 lb (102.967 kg)  01/10/16 225 lb (102.059 kg)  01/01/16 225  lb (102.059 kg)  12/28/15 224 lb (101.606 kg)  12/23/15 225 lb (102.059 kg)  12/21/15 225 lb (102.059 kg)  10/23/15 220 lb (99.791 kg)  10/23/15 220 lb (99.791 kg)   Ideal Body Weight:  54.54 kg  BMI:  Body mass index is 38.95 kg/(m^2).  Estimated Nutritional Needs:  Kcal:  1350-1550 (13-15 kcal/kg bw) Protein:  65-75 g (1.2-1.4 g/kg ibw) Fluid:  1.6 liters  EDUCATION NEEDS:  No education needs identified at this time  Christophe Louis RD, LDN Clinical Nutrition Pager: [phone] 03/07/2016 3:16 PM

## 2016-03-07 NOTE — ED Notes (Signed)
Pt reports that the morphine has not changed her pain and it is now traveling to behind her neck and down her legs. Dr Preston Fleeting notified

## 2016-03-08 DIAGNOSIS — R55 Syncope and collapse: Secondary | ICD-10-CM

## 2016-03-08 DIAGNOSIS — R946 Abnormal results of thyroid function studies: Secondary | ICD-10-CM

## 2016-03-08 LAB — COMPREHENSIVE METABOLIC PANEL
ALBUMIN: 3.6 g/dL (ref 3.5–5.0)
ALT: 12 U/L — ABNORMAL LOW (ref 14–54)
ANION GAP: 11 (ref 5–15)
AST: 21 U/L (ref 15–41)
Alkaline Phosphatase: 94 U/L (ref 38–126)
BUN: 9 mg/dL (ref 6–20)
CO2: 27 mmol/L (ref 22–32)
Calcium: 9.1 mg/dL (ref 8.9–10.3)
Chloride: 99 mmol/L — ABNORMAL LOW (ref 101–111)
Creatinine, Ser: 0.53 mg/dL (ref 0.44–1.00)
GFR calc non Af Amer: >60 mL/min (ref >60)
GLUCOSE: 110 mg/dL — AB (ref 65–99)
POTASSIUM: 4.1 mmol/L (ref 3.5–5.1)
SODIUM: 137 mmol/L (ref 135–145)
TOTAL PROTEIN: 7 g/dL (ref 6.5–8.1)
Total Bilirubin: 0.9 mg/dL (ref 0.3–1.2)

## 2016-03-08 LAB — CBC
HEMATOCRIT: 50.5 % — AB (ref 36.0–46.0)
HEMOGLOBIN: 16.1 g/dL — AB (ref 12.0–15.0)
MCH: 29.7 pg (ref 26.0–34.0)
MCHC: 31.9 g/dL (ref 30.0–36.0)
MCV: 93 fL (ref 78.0–100.0)
Platelets: 237 10*3/uL (ref 150–400)
RBC: 5.43 MIL/uL — AB (ref 3.87–5.11)
RDW: 14.6 % (ref 11.5–15.5)
WBC: 13.8 10*3/uL — ABNORMAL HIGH (ref 4.0–10.5)

## 2016-03-08 LAB — GLUCOSE, CAPILLARY
GLUCOSE-CAPILLARY: 149 mg/dL — AB (ref 65–99)
GLUCOSE-CAPILLARY: 141 mg/dL — AB (ref 65–99)
GLUCOSE-CAPILLARY: 77 mg/dL (ref 65–99)
Glucose-Capillary: 128 mg/dL — ABNORMAL HIGH (ref 65–99)

## 2016-03-08 LAB — T4, FREE: Free T4: 1.14 ng/dL — ABNORMAL HIGH (ref 0.61–1.12)

## 2016-03-08 LAB — TSH: TSH: 8.202 u[IU]/mL — AB (ref 0.350–4.500)

## 2016-03-08 MED ORDER — METOPROLOL SUCCINATE ER 50 MG PO TB24
50.0000 mg | ORAL_TABLET | Freq: Every day | ORAL | Status: DC
  Administered 2016-03-08 – 2016-03-09 (×2): 50 mg via ORAL
  Filled 2016-03-08 (×2): qty 1

## 2016-03-08 MED ORDER — DIAZEPAM 5 MG PO TABS: 2.5000 mg | ORAL_TABLET | Freq: Four times a day (QID) | ORAL | Status: DC | PRN

## 2016-03-08 NOTE — Progress Notes (Addendum)
PROGRESS NOTE    Carla [name]  ZOX:[id number] DOB: Jan 16, 1951 DOA: 03/07/2016 PCP: [name], MD    Brief Narrative:  Patient is a 65 year old woman with a history of HTN, COPD, chronic pain with spinal stenosis and neurogenic claudication, depression with anxiety, prediabetes, and recent negative Myoview cardiac stress test in 12/2015, who presented to the ED on 03/07/2016 with a chief complaint of abdominal pain, nausea, vomiting, and loose stools. In the ED, she was afebrile and hypertensive. Her WBC was elevated at 18.2, hemoglobin was 15.6, normal troponin I, normal lipase, normal LFTs, and glucose of 140. Abdominal ultrasound revealed possible acute cholecystitis due to calculi, gallbladder wall thickening and hepatic stenosis. She was admitted for further evaluation and management. Gen. surgery was consulted and felt that the patient did not need immediate surgical intervention.  Assessment & Plan:   Principal Problem:   Epigastric abdominal pain Active Problems:   Cholecystitis, acute   Cholelithiasis   COPD (chronic obstructive pulmonary disease) ([facility])   Essential hypertension   Spinal stenosis, lumbar region, with neurogenic claudication   Depression with pseudodementia   Chronic pain syndrome   Prediabetes   Obesity, morbid ([facility])   Syncope and collapse    1. Cholelithiasis with acute cholecystitis until proven otherwise. Patient was made to be virtually nothing by mouth. She was started on scheduled IV Zofran and scheduled IV Protonix. IV hydromorphone was ordered as needed for pain. Some of her oral home medications were withheld initially. Dr. Lovell Sheehan was consulted and felt that the patient did not need acute surgical intervention at this time. He agreed with medical management. He added Zosyn empirically. Patient is abdominal pain, nausea, and vomiting have subsided. Her LFTs remain within normal limits. -Her white blood cell count has improved. -We'll try full  liquid diet. Will gradually restart some of her home medications. Continue IV fluid hydration.  Syncope and collapse, likely secondary to orthostatic hypotension or micturition syncopal. This morning, the patient was on the bedside commode, stood up, and had a syncopal episode for a few seconds. She was found toppled over on the bed with no apparent injury. She quickly regained consciousness and became alert and oriented. On exam, her vital signs were stable and her oxygen saturation was in the 90s. There were no focal neurological findings. -The patient was advised to call the nurse for assistance when going to the bedside commode.  Elevated TSH. The patient's TSH was found to be 8.2. 2 months ago, it was within normal limits. -We'll order free T4 and free T3 and if either is low, will start Synthroid.  Hypertension. Patient is treated chronically with amlodipine and Toprol-XL. Both were we held on admission and she was started on IV metoprolol. With improvement in her GI symptoms, will restart Toprol-XL. We'll continue to hold amlodipine.  COPD. Currently stable. Will continue when necessary albuterol nebulizer.  Prediabetes. Patient is treated chronically with metformin. It was held on admission. She was started on sliding scale NovoLog. CBGs are currently reasonable. Hemoglobin A1c pending.  Chronic pain syndrome. Patient is treated chronically with topical Voltaren, Percocet, gabapentin, Flexeril. We'll continue to hold Percocet and Flexeril. We'll treat her pain with as needed hydromorphone. Restart Percocet with nausea vomiting have completely resolved. -We'll order physical therapy consult.  Depression with anxiety. Patient is treated chronically with diazepam and Cymbalta. We'll continue Cymbalta and restart diazepam orally as needed.   DVT prophylaxis: Subcutaneous heparin Code Status:Full Family Communication: Family not available Disposition Plan: Discharge when  clinically  appropriate, likely in a few days.   Consultants:   Gen. surgery  Procedures:  None  Antimicrobials:  Zosyn 03/07/16>   Subjective: Patient had apparently just gotten up from the bedside commode and toppled over on the bed. She apparently lost consciousness for a few seconds before coming to. She reports that when she stood up, she passed out on the bed. She  was found semi-conscious lying face first on the bed.  Objective: Filed Vitals:   03/07/16 2000 03/07/16 2102 03/08/16 0600 03/08/16 0810  BP:  153/54 104/48 122/70  Pulse:  68 74 81  Temp: 97.6 F (36.4 C) 98.2 F (36.8 C) 98.8 F (37.1 C)   TempSrc: Oral Oral Oral   Resp:  20 18 18   Height:  5\' 4"  (1.626 m)    Weight:  101.9 kg (224 lb 10.4 oz)    SpO2:  96% 96% 98%    Intake/Output Summary (Last 24 hours) at 03/08/16 0919 Last data filed at 03/08/16 0600  Gross per 24 hour  Intake  592.5 ml  Output   1250 ml  Net -657.5 ml   Filed Weights   03/07/16 0552 03/07/16 2102  Weight: 102.967 kg (227 lb) 101.9 kg (224 lb 10.4 oz)    Examination:  General exam: Following brief syncopal episode, the patient is alert and oriented to herself and hospital, but appears a little shaken up. Respiratory system: Clear to auscultation. Respiratory effort normal. Cardiovascular system: S1 & S2 heard, RRR. No JVD, murmurs, rubs, gallops or clicks. No pedal edema. Gastrointestinal system: Abdomen is obese, mildly tender in the epigastrium and right upper quadrant; bowel sounds present; no masses palpated.  Central nervous system: Patient is post syncopal and appears a little anxious, but becomes alert and oriented to herself and hospital. Her speech is clear. Cranial nerves II through XII are grossly intact.. Extremities: No pedal edema. Skin: No rashes, lesions or ulcers Psychiatry: Some anxiousness.     Data Reviewed: I have personally reviewed following labs and imaging studies  CBC:  Recent Labs Lab  03/07/16 0634 03/08/16 0637  WBC 18.2* 13.8*  NEUTROABS 12.2*  --   HGB 15.6* 16.1*  HCT 47.3* 50.5*  MCV 91.1 93.0  PLT 284 237   Basic Metabolic Panel:  Recent Labs Lab 03/07/16 0634 03/08/16 0637  NA 135 137  K 3.5 4.1  CL 101 99*  CO2 24 27  GLUCOSE 140* 110*  BUN 15 9  CREATININE 0.65 0.53  CALCIUM 9.1 9.1   GFR: Estimated Creatinine Clearance: 84.7 mL/min (by C-G formula based on Cr of 0.53). Liver Function Tests:  Recent Labs Lab 03/07/16 0634 03/08/16 0637  AST 17 21  ALT 13* 12*  ALKPHOS 95 94  BILITOT 0.4 0.9  PROT 7.4 7.0  ALBUMIN 4.1 3.6    Recent Labs Lab 03/07/16 0634  LIPASE 12   No results for input(s): AMMONIA in the last 168 hours. Coagulation Profile: No results for input(s): INR, PROTIME in the last 168 hours. Cardiac Enzymes:  Recent Labs Lab 03/07/16 0634  TROPONINI <0.03   BNP (last 3 results) No results for input(s): PROBNP in the last 8760 hours. HbA1C: No results for input(s): HGBA1C in the last 72 hours. CBG:  Recent Labs Lab 03/07/16 1217 03/07/16 1705 03/07/16 2040 03/08/16 0811  GLUCAP 145* 110* 123* 149*   Lipid Profile: No results for input(s): CHOL, HDL, LDLCALC, TRIG, CHOLHDL, LDLDIRECT in the last 72 hours. Thyroid Function Tests: No results  for input(s): TSH, T4TOTAL, FREET4, T3FREE, THYROIDAB in the last 72 hours. Anemia Panel: No results for input(s): VITAMINB12, FOLATE, FERRITIN, TIBC, IRON, RETICCTPCT in the last 72 hours. Sepsis Labs: No results for input(s): PROCALCITON, LATICACIDVEN in the last 168 hours.  No results found for this or any previous visit (from the past 240 hour(s)).       Radiology Studies: US Abdomen Complete  03/07/2016  CLINICAL DATA:  Abdominal pain with nausea, vomiting, and diarrhea since 2 a.m. EXAM: ABDOMEN ULTRASOUND COMPLETE COMPARISON:  Renal ultrasound 06/30/2015 FINDINGS: Gallbladder: Gallstones including a large stone in the neck. The gallbladder is full.  Reportedly no focal tenderness but medicated. Mild wall thickening (up to 6 mm) and striation. Common bile duct: Diameter: 5 mm Liver: Echogenic with poor acoustic penetration. No focal lesion. Antegrade flow in the imaged hepatic and portal venous system. IVC: No abnormality visualized. Pancreas: Visualized portion unremarkable. Spleen: Size and appearance within normal limits. Right Kidney: Length: 13.6 cm. Echogenicity within normal limits. No mass or hydronephrosis visualized. Left Kidney: Length: 13 cm. Echogenicity within normal limits. No mass or hydronephrosis visualized. Abdominal aorta: Obscured by bowel gas. No visible aneurysm on 10/23/2015 lumbar spine MRI. Other findings: Difficult study due to patient's size and bowel gas. IMPRESSION: 1. Possible acute cholecystitis due to calculi and wall thickening. No sonographic Murphy's sign, but potentially affected by analgesic medication. 2. Hepatic steatosis. Electronically Signed   By: Marnee Spring M.D.   On: 03/07/2016 08:27   Dg Chest Port 1 View  03/07/2016  CLINICAL DATA:  Chest discomfort.  Shortness of breath. EXAM: PORTABLE CHEST 1 VIEW COMPARISON:  01/10/2016 FINDINGS: Progressive bronchitic change from prior. Increased infrahilar atelectasis bilaterally. Cardiomediastinal contours are unchanged. No confluent airspace disease, large pleural effusion or pneumothorax. IMPRESSION: Increased bronchitic change and infrahilar atelectasis. Electronically Signed   By: Rubye Oaks M.D.   On: 03/07/2016 06:26        Scheduled Meds: . diclofenac sodium  4 g Topical Q8H  . DULoxetine  60 mg Oral QHS  . gabapentin  300 mg Oral BID  . heparin  5,000 Units Subcutaneous Q8H  . insulin aspart  0-5 Units Subcutaneous QHS  . insulin aspart  0-9 Units Subcutaneous TID WC  . metoprolol  10 mg Intravenous Q6H  . ondansetron (ZOFRAN) IV  4 mg Intravenous Q6H  . pantoprazole (PROTONIX) IV  40 mg Intravenous Q24H  . piperacillin-tazobactam  3.375  g Intravenous Q8H   Continuous Infusions: . 0.9 % NaCl with KCl 20 mEq / L 75 mL/hr at 03/08/16 0250     LOS: 1 day    Time spent: 35 minutes    Elliot Cousin, MD [facility] Pager [phone]  If 7PM-7AM, please contact night-coverage [email or web address] Password [facility] 03/08/2016, 9:19 AM

## 2016-03-08 NOTE — Progress Notes (Signed)
  Subjective: Patient was asleep. Denies any significant right upper quadrant abdominal pain.  Objective: Vital signs in last 24 hours: Temp:  [97 F (36.1 C)-98.8 F (37.1 C)] 98.8 F (37.1 C) (05/27 0600) Pulse Rate:  [52-82] 81 (05/27 0810) Resp:  [11-21] 18 (05/27 0810) BP: (104-192)/(48-151) 122/70 mmHg (05/27 0810) SpO2:  [94 %-100 %] 98 % (05/27 0810) Weight:  [101.9 kg (224 lb 10.4 oz)] 101.9 kg (224 lb 10.4 oz) (05/26 2102)    Intake/Output from previous day: 05/26 0701 - 05/27 0700 In: 592.5 [I.V.:542.5; IV Piggyback:50] Out: 1250 [Urine:1250] Intake/Output this shift:    General appearance: alert, cooperative and no distress GI: Soft, no specific tenderness noted. No rigidity noted. Bowel sounds appreciated.  Lab Results:   Recent Labs  03/07/16 0634 03/08/16 0637  WBC 18.2* 13.8*  HGB 15.6* 16.1*  HCT 47.3* 50.5*  PLT 284 237   BMET  Recent Labs  03/07/16 0634 03/08/16 0637  NA 135 137  K 3.5 4.1  CL 101 99*  CO2 24 27  GLUCOSE 140* 110*  BUN 15 9  CREATININE 0.65 0.53  CALCIUM 9.1 9.1   PT/INR No results for input(s): LABPROT, INR in the last 72 hours.  Studies/Results: US Abdomen Complete  03/07/2016  CLINICAL DATA:  Abdominal pain with nausea, vomiting, and diarrhea since 2 a.m. EXAM: ABDOMEN ULTRASOUND COMPLETE COMPARISON:  Renal ultrasound 06/30/2015 FINDINGS: Gallbladder: Gallstones including a large stone in the neck. The gallbladder is full. Reportedly no focal tenderness but medicated. Mild wall thickening (up to 6 mm) and striation. Common bile duct: Diameter: 5 mm Liver: Echogenic with poor acoustic penetration. No focal lesion. Antegrade flow in the imaged hepatic and portal venous system. IVC: No abnormality visualized. Pancreas: Visualized portion unremarkable. Spleen: Size and appearance within normal limits. Right Kidney: Length: 13.6 cm. Echogenicity within normal limits. No mass or hydronephrosis visualized. Left Kidney: Length:  13 cm. Echogenicity within normal limits. No mass or hydronephrosis visualized. Abdominal aorta: Obscured by bowel gas. No visible aneurysm on 10/23/2015 lumbar spine MRI. Other findings: Difficult study due to patient's size and bowel gas. IMPRESSION: 1. Possible acute cholecystitis due to calculi and wall thickening. No sonographic Murphy's sign, but potentially affected by analgesic medication. 2. Hepatic steatosis. Electronically Signed   By: Marnee Spring M.D.   On: 03/07/2016 08:27   Dg Chest Port 1 View  03/07/2016  CLINICAL DATA:  Chest discomfort.  Shortness of breath. EXAM: PORTABLE CHEST 1 VIEW COMPARISON:  01/10/2016 FINDINGS: Progressive bronchitic change from prior. Increased infrahilar atelectasis bilaterally. Cardiomediastinal contours are unchanged. No confluent airspace disease, large pleural effusion or pneumothorax. IMPRESSION: Increased bronchitic change and infrahilar atelectasis. Electronically Signed   By: Rubye Oaks M.D.   On: 03/07/2016 06:26    Anti-infectives: Anti-infectives    Start     Dose/Rate Route Frequency Ordered Stop   03/07/16 1400  piperacillin-tazobactam (ZOSYN) IVPB 3.375 g     3.375 g 12.5 mL/hr over 240 Minutes Intravenous Every 8 hours 03/07/16 1305        Assessment/Plan: Impression: Abdominal pain of unknown etiology, cholelithiasis, though no active biliary colic. Leukocytosis improving. Plan: No need for acute surgical intervention. We'll advance to heart healthy diet. Will continue to follow with you.  LOS: 1 day    Amee Boothe A 03/08/2016

## 2016-03-09 LAB — GLUCOSE, CAPILLARY
GLUCOSE-CAPILLARY: 130 mg/dL — AB (ref 65–99)
Glucose-Capillary: 103 mg/dL — ABNORMAL HIGH (ref 65–99)
Glucose-Capillary: 86 mg/dL (ref 65–99)

## 2016-03-09 LAB — CBC
HEMATOCRIT: 46.1 % — AB (ref 36.0–46.0)
Hemoglobin: 14.5 g/dL (ref 12.0–15.0)
MCH: 29.1 pg (ref 26.0–34.0)
MCHC: 31.5 g/dL (ref 30.0–36.0)
MCV: 92.4 fL (ref 78.0–100.0)
PLATELETS: 260 10*3/uL (ref 150–400)
RBC: 4.99 MIL/uL (ref 3.87–5.11)
RDW: 14.6 % (ref 11.5–15.5)
WBC: 11.8 10*3/uL — ABNORMAL HIGH (ref 4.0–10.5)

## 2016-03-09 LAB — URINE CULTURE: Special Requests: NORMAL

## 2016-03-09 LAB — BASIC METABOLIC PANEL
ANION GAP: 7 (ref 5–15)
BUN: 9 mg/dL (ref 6–20)
CALCIUM: 8.4 mg/dL — AB (ref 8.9–10.3)
CO2: 27 mmol/L (ref 22–32)
Chloride: 102 mmol/L (ref 101–111)
Creatinine, Ser: 0.53 mg/dL (ref 0.44–1.00)
GFR calc Af Amer: >60 mL/min (ref >60)
GLUCOSE: 102 mg/dL — AB (ref 65–99)
Potassium: 3.8 mmol/L (ref 3.5–5.1)
Sodium: 136 mmol/L (ref 135–145)

## 2016-03-09 MED ORDER — CIPROFLOXACIN HCL 500 MG PO TABS: 500.0000 mg | ORAL_TABLET | Freq: Two times a day (BID) | ORAL | Status: DC

## 2016-03-09 MED ORDER — PANTOPRAZOLE SODIUM 40 MG PO TBEC: 40.0000 mg | DELAYED_RELEASE_TABLET | Freq: Every day | ORAL | Status: DC

## 2016-03-09 NOTE — Discharge Summary (Signed)
Physician Discharge Summary  Carla [name] ZRA:[id number] DOB: 1951-05-19 DOA: 03/07/2016  PCP: [name], MD  Admit date: 03/07/2016 Discharge date: 03/09/2016  Time spent: 45 minutes  Recommendations for Outpatient Follow-up:  -Will be discharged home today. -Will follow up with Dr. Lovell Sheehan in 2 weeks.   Discharge Diagnoses:  Principal Problem:   Epigastric abdominal pain Active Problems:   COPD (chronic obstructive pulmonary disease) (HCC)   Essential hypertension   Spinal stenosis, lumbar region, with neurogenic claudication   Depression with pseudodementia   Cholecystitis, acute   Chronic pain syndrome   Prediabetes   Obesity, morbid (HCC)   Cholelithiasis   Syncope and collapse   Abnormal thyroid function test   Discharge Condition: Stable and improved  Filed Weights   03/07/16 0552 03/07/16 2102 03/09/16 0635  Weight: 102.967 kg (227 lb) 101.9 kg (224 lb 10.4 oz) 101.3 kg (223 lb 5.2 oz)    History of present illness:  As per Dr. Sherrie Mustache on 5/26: Carla [name] is a 65 y.o. 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 Her symptoms started at 2 AM this morning. She describes the pain as a tightness around her upper abdomen with radiation to her left upper chest and right upper chest. The pain is rated as a 10 over 10 in intensity. She has had intermittent shortness of breath. She also developed nausea, vomiting, chills, and loose stools. She reports vomiting more than 10 times and having 4 or 5 loose stools. She reports seeing small amount of blood in her emesis. She denies black tarry stools or bright red blood per rectum. She denies pain with urination. She took an over-the-counter antinausea  medication, but it did not help. She took her chronic pain medications, but she was unable to keep them down.  ED Course: In the ED, she was afebrile and hypertensive. Her lab data were significant for a WBC of 18.2, hemoglobin of 15.6, normal troponin I, normal lipase, normal LFTs, and a glucose of 140. Her chest x-ray revealed increased bronchitic changes and infra hilar atelectasis. Later, ultrasound of her abdomen was ordered and revealed possible acute cholecystitis due to calculi and wall thickening and hepatic steatosis. She is being admitted for further evaluation and management.  Hospital Course:   Cholelithiasis with acute cholecystitis until proven otherwise. -Seen by surgery, who does not believe cholecystectomy is required at this point. -Will f/u with Dr. Lovell Sheehan in the OP setting. -Will give an extra 5 days of cipro upon discharge.  Hypertension. -Well controlled. -Continue home medications.  COPD. Currently stable. Will continue when necessary albuterol nebulizer.  Prediabetes. -Resume metformin on DC.  Chronic pain syndrome. Continue home medications.  Depression with anxiety. Patient is treated chronically with diazepam and Cymbalta.  Procedures:  None   Consultations:  Surgery, Dr. Lovell Sheehan  Discharge Instructions  Discharge Instructions    Diet - low sodium heart healthy    Complete by:  As directed      Increase activity slowly    Complete by:  As directed             Medication List    TAKE these medications        albuterol 108 (90 Base) MCG/ACT inhaler  Commonly known as:  PROVENTIL HFA;VENTOLIN HFA  Inhale 2 puffs into the lungs every 6 (six) hours as needed for wheezing.     amLODipine 2.5 MG tablet  Commonly known as:  NORVASC  Take 2 tablets (5 mg total) by mouth daily.     aspirin EC 81 MG tablet  Take 1 tablet (81 mg total) by mouth daily.     ciprofloxacin 500 MG tablet  Commonly known as:  CIPRO  Take 1 tablet (500 mg total)  by mouth 2 (two) times daily.     cyclobenzaprine 10 MG tablet  Commonly known as:  FLEXERIL  Take 10 mg by mouth 3 (three) times daily as needed for muscle spasms.     diazepam 5 MG tablet  Commonly known as:  VALIUM  Take 5 mg by mouth 3 (three) times daily as needed for anxiety.     DULoxetine 60 MG capsule  Commonly known as:  CYMBALTA  Take 60 mg by mouth at bedtime.     EVZIO 0.4 MG/0.4ML Soaj  Generic drug:  Naloxone HCl  Inject 0.4 mLs into the muscle once as needed (for Anaphylaxis/allergic reaction).     gabapentin 300 MG capsule  Commonly known as:  NEURONTIN  Take 300 mg by mouth 3 (three) times daily.     hydrOXYzine 25 MG capsule  Commonly known as:  VISTARIL  Take 25 mg by mouth at bedtime.     metFORMIN 500 MG 24 hr tablet  Commonly known as:  GLUCOPHAGE-XR  Take 500 mg by mouth at bedtime.     metoprolol succinate 50 MG 24 hr tablet  Commonly known as:  TOPROL-XL  Take 50 mg by mouth at bedtime.     omeprazole 20 MG capsule  Commonly known as:  PRILOSEC  Take 1 capsule (20 mg total) by mouth daily.     oxyCODONE-acetaminophen 10-325 MG tablet  Commonly known as:  PERCOCET  Take 1 tablet by mouth every 6 (six) hours as needed for pain.     Vitamin D 2000 units Caps  Take 1 capsule by mouth daily.     VOLTAREN 1 % Gel  Generic drug:  diclofenac sodium  Apply 4 g topically every 8 (eight) hours.       Allergies  Allergen Reactions  . Bee Venom Anaphylaxis  . Mushroom Extract Complex Anaphylaxis    Extreme sweating, vomiting  . Amitriptyline Other (See Comments)    Has nightmares when using, not in right state of mind   . Toradol [Ketorolac Tromethamine] Nausea And Vomiting  . Tramadol Nausea And Vomiting  . Trazodone And Nefazodone Nausea And Vomiting       Follow-up Information    Follow up with Franky Macho A, MD. Schedule an appointment as soon as possible for a visit in 2 weeks.   Specialty:  General Surgery   Contact information:     [address] [place] [address] [phone]        The results of significant diagnostics from this hospitalization (including imaging, microbiology, ancillary and laboratory) are listed below for reference.    Significant Diagnostic Studies: US Abdomen Complete  03/07/2016  CLINICAL DATA:  Abdominal pain with nausea, vomiting, and diarrhea since 2 a.m. EXAM: ABDOMEN ULTRASOUND COMPLETE COMPARISON:  Renal ultrasound 06/30/2015 FINDINGS: Gallbladder: Gallstones including a large stone in the neck. The gallbladder is full. Reportedly no focal tenderness but medicated. Mild wall thickening (up to 6 mm) and striation. Common bile duct: Diameter: 5 mm Liver: Echogenic with poor acoustic penetration. No focal  lesion. Antegrade flow in the imaged hepatic and portal venous system. IVC: No abnormality visualized. Pancreas: Visualized portion unremarkable. Spleen: Size and appearance within normal limits. Right Kidney: Length: 13.6 cm. Echogenicity within normal limits. No mass or hydronephrosis visualized. Left Kidney: Length: 13 cm. Echogenicity within normal limits. No mass or hydronephrosis visualized. Abdominal aorta: Obscured by bowel gas. No visible aneurysm on 10/23/2015 lumbar spine MRI. Other findings: Difficult study due to patient's size and bowel gas. IMPRESSION: 1. Possible acute cholecystitis due to calculi and wall thickening. No sonographic Murphy's sign, but potentially affected by analgesic medication. 2. Hepatic steatosis. Electronically Signed   By: Marnee Spring M.D.   On: 03/07/2016 08:27   Dg Chest Port 1 View  03/07/2016  CLINICAL DATA:  Chest discomfort.  Shortness of breath. EXAM: PORTABLE CHEST 1 VIEW COMPARISON:  01/10/2016 FINDINGS: Progressive bronchitic change from prior. Increased infrahilar atelectasis bilaterally. Cardiomediastinal contours are unchanged. No confluent airspace disease, large pleural effusion or pneumothorax. IMPRESSION: Increased bronchitic  change and infrahilar atelectasis. Electronically Signed   By: Rubye Oaks M.D.   On: 03/07/2016 06:26    Microbiology: Recent Results (from the past 240 hour(s))  Urine culture     Status: Abnormal   Collection Time: 03/07/16 11:00 AM  Result Value Ref Range Status   Specimen Description URINE, CLEAN CATCH  Final   Special Requests Normal  Final   Culture MULTIPLE SPECIES PRESENT, SUGGEST RECOLLECTION (A)  Final   Report Status 03/09/2016 FINAL  Final     Labs: Basic Metabolic Panel:  Recent Labs Lab 03/07/16 0634 03/08/16 0637 03/09/16 0656  NA 135 137 136  K 3.5 4.1 3.8  CL 101 99* 102  CO2 GLUCOSE 140* 110* 102*  BUN CREATININE 0.65 0.53 0.53  CALCIUM 9.1 9.1 8.4*   Liver Function Tests:  Recent Labs Lab 03/07/16 0634 03/08/16 0637  AST 17 21  ALT 13* 12*  ALKPHOS 95 94  BILITOT 0.4 0.9  PROT 7.4 7.0  ALBUMIN 4.1 3.6    Recent Labs Lab 03/07/16 0634  LIPASE 12   No results for input(s): AMMONIA in the last 168 hours. CBC:  Recent Labs Lab 03/07/16 0634 03/08/16 0637 03/09/16 0656  WBC 18.2* 13.8* 11.8*  NEUTROABS 12.2*  --   --   HGB 15.6* 16.1* 14.5  HCT 47.3* 50.5* 46.1*  MCV 91.1 93.0 92.4  PLT 284 237 260   Cardiac Enzymes:  Recent Labs Lab 03/07/16 0634  TROPONINI <0.03   BNP: BNP (last 3 results) No results for input(s): BNP in the last 8760 hours.  ProBNP (last 3 results) No results for input(s): PROBNP in the last 8760 hours.  CBG:  Recent Labs Lab 03/08/16 1143 03/08/16 1626 03/08/16 2058 03/09/16 0750 03/09/16 1151  GLUCAP 141* 77 128* 103* 86       Signed:  HERNANDEZ ACOSTA,ESTELA  [facility] Pager: [phone] 03/09/2016, 1:35 PM

## 2016-03-09 NOTE — Progress Notes (Signed)
Pt's IV catheter removed and intact. Pt's IV site clean dry and intact. Discharge instructions including follow up appointments and medications were reviewed and discussed with patient's daughter. Pt's daughter verbalized understanding of discharge instructions including medications. All questions were answered and no further questions at this time. Pt in stable condition and in no acute distress at time of discharge. Pt escorted by nurse tech.

## 2016-03-09 NOTE — Progress Notes (Signed)
  Subjective: Patient has minimal epigastric pain. Tolerating diet well.  Objective: Vital signs in last 24 hours: Temp:  [98.4 F (36.9 C)-99.4 F (37.4 C)] 99.4 F (37.4 C) (05/28 0635) Pulse Rate:  [53-80] 53 (05/28 0635) Resp:  [20] 20 (05/28 0635) BP: (108-134)/(60-88) 108/88 mmHg (05/28 0635) SpO2:  [98 %-100 %] 99 % (05/28 0635) Weight:  [101.3 kg (223 lb 5.2 oz)] 101.3 kg (223 lb 5.2 oz) (05/28 0635)    Intake/Output from previous day: 05/27 0701 - 05/28 0700 In: 1957.5 [P.O.:300; I.V.:1607.5; IV Piggyback:50] Out: 2100 [Urine:2100] Intake/Output this shift: Total I/O In: -  Out: 300 [Urine:300]  General appearance: alert, cooperative and no distress GI: soft, non-tender; bowel sounds normal; no masses,  no organomegaly  Lab Results:   Recent Labs  03/08/16 0637 03/09/16 0656  WBC 13.8* 11.8*  HGB 16.1* 14.5  HCT 50.5* 46.1*  PLT 237 260   BMET  Recent Labs  03/08/16 0637 03/09/16 0656  NA 137 136  K 4.1 3.8  CL 99* 102  CO2 27 27  GLUCOSE 110* 102*  BUN 9 9  CREATININE 0.53 0.53  CALCIUM 9.1 8.4*   PT/INR No results for input(s): LABPROT, INR in the last 72 hours.  Studies/Results: No results found.  Anti-infectives: Anti-infectives    Start     Dose/Rate Route Frequency Ordered Stop   03/07/16 1400  piperacillin-tazobactam (ZOSYN) IVPB 3.375 g     3.375 g 12.5 mL/hr over 240 Minutes Intravenous Every 8 hours 03/07/16 1305        Assessment/Plan: Impression: Abdominal pain, resolving. Cholelithiasis with normal LFTs. Plan: Patient does not need a cholecystectomy during this is admission. Okay for discharge from surgery standpoint. This could be done on elective basis should the patient continued to have abdominal pain. I can see her in my office as an outpatient.  LOS: 2 days    Mahin Guardia A 03/09/2016

## 2016-03-10 ENCOUNTER — Emergency Department (HOSPITAL_COMMUNITY): Payer: Medicare Other

## 2016-03-10 ENCOUNTER — Encounter (HOSPITAL_COMMUNITY): Payer: Self-pay

## 2016-03-10 ENCOUNTER — Inpatient Hospital Stay (HOSPITAL_COMMUNITY)
Admission: EM | Admit: 2016-03-10 | Discharge: 2016-03-13 | DRG: 418 | Disposition: A | Discharge status: Home / Self Care | Payer: Medicare Other | Attending: Internal Medicine | Admitting: Internal Medicine

## 2016-03-10 DIAGNOSIS — R1013 Epigastric pain: Secondary | ICD-10-CM

## 2016-03-10 DIAGNOSIS — R1011 Right upper quadrant pain: Secondary | ICD-10-CM | POA: Diagnosis not present

## 2016-03-10 DIAGNOSIS — K810 Acute cholecystitis: Secondary | ICD-10-CM | POA: Diagnosis present

## 2016-03-10 DIAGNOSIS — E876 Hypokalemia: Secondary | ICD-10-CM | POA: Diagnosis not present

## 2016-03-10 DIAGNOSIS — R1110 Vomiting, unspecified: Secondary | ICD-10-CM

## 2016-03-10 DIAGNOSIS — R55 Syncope and collapse: Secondary | ICD-10-CM | POA: Diagnosis present

## 2016-03-10 DIAGNOSIS — Z888 Allergy status to other drugs, medicaments and biological substances status: Secondary | ICD-10-CM

## 2016-03-10 DIAGNOSIS — Z7982 Long term (current) use of aspirin: Secondary | ICD-10-CM

## 2016-03-10 DIAGNOSIS — F329 Major depressive disorder, single episode, unspecified: Secondary | ICD-10-CM | POA: Diagnosis present

## 2016-03-10 DIAGNOSIS — J449 Chronic obstructive pulmonary disease, unspecified: Secondary | ICD-10-CM | POA: Diagnosis present

## 2016-03-10 DIAGNOSIS — R109 Unspecified abdominal pain: Secondary | ICD-10-CM | POA: Diagnosis present

## 2016-03-10 DIAGNOSIS — K8020 Calculus of gallbladder without cholecystitis without obstruction: Secondary | ICD-10-CM | POA: Diagnosis present

## 2016-03-10 DIAGNOSIS — F17210 Nicotine dependence, cigarettes, uncomplicated: Secondary | ICD-10-CM | POA: Diagnosis present

## 2016-03-10 DIAGNOSIS — K8012 Calculus of gallbladder with acute and chronic cholecystitis without obstruction: Principal | ICD-10-CM | POA: Diagnosis present

## 2016-03-10 DIAGNOSIS — I10 Essential (primary) hypertension: Secondary | ICD-10-CM | POA: Diagnosis present

## 2016-03-10 DIAGNOSIS — G8929 Other chronic pain: Secondary | ICD-10-CM | POA: Diagnosis present

## 2016-03-10 DIAGNOSIS — Z6838 Body mass index (BMI) 38.0-38.9, adult: Secondary | ICD-10-CM

## 2016-03-10 DIAGNOSIS — R7303 Prediabetes: Secondary | ICD-10-CM | POA: Diagnosis present

## 2016-03-10 DIAGNOSIS — K821 Hydrops of gallbladder: Secondary | ICD-10-CM | POA: Diagnosis present

## 2016-03-10 DIAGNOSIS — M549 Dorsalgia, unspecified: Secondary | ICD-10-CM | POA: Diagnosis present

## 2016-03-10 DIAGNOSIS — R112 Nausea with vomiting, unspecified: Secondary | ICD-10-CM | POA: Diagnosis present

## 2016-03-10 LAB — URINALYSIS, ROUTINE W REFLEX MICROSCOPIC
Bilirubin Urine: NEGATIVE
GLUCOSE, UA: NEGATIVE mg/dL
HGB URINE DIPSTICK: NEGATIVE
Ketones, ur: NEGATIVE mg/dL
Leukocytes, UA: NEGATIVE
Nitrite: NEGATIVE
PROTEIN: NEGATIVE mg/dL
Specific Gravity, Urine: 1.015 (ref 1.005–1.030)
pH: 8.5 — ABNORMAL HIGH (ref 5.0–8.0)

## 2016-03-10 LAB — COMPREHENSIVE METABOLIC PANEL
ALT: 11 U/L — AB (ref 14–54)
AST: 15 U/L (ref 15–41)
Albumin: 3.6 g/dL (ref 3.5–5.0)
Alkaline Phosphatase: 86 U/L (ref 38–126)
Anion gap: 11 (ref 5–15)
BILIRUBIN TOTAL: 0.5 mg/dL (ref 0.3–1.2)
BUN: 7 mg/dL (ref 6–20)
CO2: 25 mmol/L (ref 22–32)
CREATININE: 0.55 mg/dL (ref 0.44–1.00)
Calcium: 9.5 mg/dL (ref 8.9–10.3)
Chloride: 104 mmol/L (ref 101–111)
GFR calc Af Amer: >60 mL/min (ref >60)
Glucose, Bld: 111 mg/dL — ABNORMAL HIGH (ref 65–99)
Potassium: 4 mmol/L (ref 3.5–5.1)
Sodium: 140 mmol/L (ref 135–145)
TOTAL PROTEIN: 7.3 g/dL (ref 6.5–8.1)

## 2016-03-10 LAB — CBC WITH DIFFERENTIAL/PLATELET
Basophils Absolute: 0 10*3/uL (ref 0.0–0.1)
Basophils Relative: 0 %
EOS PCT: 1 %
Eosinophils Absolute: 0.1 10*3/uL (ref 0.0–0.7)
HCT: 45.1 % (ref 36.0–46.0)
Hemoglobin: 14.5 g/dL (ref 12.0–15.0)
LYMPHS ABS: 3.4 10*3/uL (ref 0.7–4.0)
LYMPHS PCT: 30 %
MCH: 29.5 pg (ref 26.0–34.0)
MCHC: 32.2 g/dL (ref 30.0–36.0)
MCV: 91.9 fL (ref 78.0–100.0)
MONO ABS: 0.8 10*3/uL (ref 0.1–1.0)
MONOS PCT: 7 %
Neutro Abs: 6.8 10*3/uL (ref 1.7–7.7)
Neutrophils Relative %: 62 %
PLATELETS: 266 10*3/uL (ref 150–400)
RBC: 4.91 MIL/uL (ref 3.87–5.11)
RDW: 14.4 % (ref 11.5–15.5)
WBC: 11.1 10*3/uL — ABNORMAL HIGH (ref 4.0–10.5)

## 2016-03-10 LAB — LACTIC ACID, PLASMA
Lactic Acid, Venous: 1.4 mmol/L (ref 0.5–2.0)
Lactic Acid, Venous: 1.2 mmol/L (ref 0.5–2.0)

## 2016-03-10 LAB — TROPONIN I: Troponin I: <0.03 ng/mL (ref <0.031)

## 2016-03-10 LAB — TSH: TSH: 3.083 u[IU]/mL (ref 0.350–4.500)

## 2016-03-10 LAB — LIPASE, BLOOD: LIPASE: 11 U/L (ref 11–51)

## 2016-03-10 MED ORDER — ONDANSETRON HCL 4 MG/2ML IJ SOLN
4.0000 mg | INTRAMUSCULAR | Status: DC | PRN
  Administered 2016-03-10: 4 mg via INTRAVENOUS
  Filled 2016-03-10 (×2): qty 2

## 2016-03-10 MED ORDER — METRONIDAZOLE IN NACL 5-0.79 MG/ML-% IV SOLN
500.0000 mg | Freq: Three times a day (TID) | INTRAVENOUS | Status: DC
  Administered 2016-03-11 – 2016-03-13 (×7): 500 mg via INTRAVENOUS
  Filled 2016-03-10 (×7): qty 100

## 2016-03-10 MED ORDER — ASPIRIN EC 81 MG PO TBEC
81.0000 mg | DELAYED_RELEASE_TABLET | Freq: Every day | ORAL | Status: DC
  Administered 2016-03-11 – 2016-03-13 (×2): 81 mg via ORAL
  Filled 2016-03-10 (×2): qty 1

## 2016-03-10 MED ORDER — PANTOPRAZOLE SODIUM 40 MG PO TBEC
40.0000 mg | DELAYED_RELEASE_TABLET | Freq: Every day | ORAL | Status: DC
  Administered 2016-03-11 – 2016-03-13 (×2): 40 mg via ORAL
  Filled 2016-03-10 (×2): qty 1

## 2016-03-10 MED ORDER — ONDANSETRON HCL 4 MG PO TABS: 4.0000 mg | ORAL_TABLET | Freq: Four times a day (QID) | ORAL | Status: DC | PRN

## 2016-03-10 MED ORDER — DEXTROSE-NACL 5-0.9 % IV SOLN
INTRAVENOUS | Status: DC
  Administered 2016-03-10 – 2016-03-12 (×4): via INTRAVENOUS

## 2016-03-10 MED ORDER — ONDANSETRON HCL 4 MG/2ML IJ SOLN
4.0000 mg | INTRAMUSCULAR | Status: DC | PRN
  Administered 2016-03-10: 4 mg via INTRAVENOUS

## 2016-03-10 MED ORDER — DULOXETINE HCL 60 MG PO CPEP
60.0000 mg | ORAL_CAPSULE | Freq: Every day | ORAL | Status: DC
  Administered 2016-03-11 – 2016-03-12 (×2): 60 mg via ORAL
  Filled 2016-03-10 (×3): qty 1

## 2016-03-10 MED ORDER — ONDANSETRON HCL 4 MG/2ML IJ SOLN
4.0000 mg | Freq: Four times a day (QID) | INTRAMUSCULAR | Status: DC | PRN
  Administered 2016-03-11 – 2016-03-13 (×2): 4 mg via INTRAVENOUS
  Filled 2016-03-10 (×2): qty 2

## 2016-03-10 MED ORDER — METOPROLOL SUCCINATE ER 50 MG PO TB24
50.0000 mg | ORAL_TABLET | Freq: Every day | ORAL | Status: DC
  Administered 2016-03-11 – 2016-03-12 (×2): 50 mg via ORAL
  Filled 2016-03-10 (×3): qty 1

## 2016-03-10 MED ORDER — CIPROFLOXACIN IN D5W 400 MG/200ML IV SOLN
400.0000 mg | Freq: Once | INTRAVENOUS | Status: AC
  Administered 2016-03-10: 400 mg via INTRAVENOUS
  Filled 2016-03-10: qty 200

## 2016-03-10 MED ORDER — CIPROFLOXACIN IN D5W 400 MG/200ML IV SOLN
400.0000 mg | Freq: Two times a day (BID) | INTRAVENOUS | Status: DC
  Administered 2016-03-11 – 2016-03-13 (×5): 400 mg via INTRAVENOUS
  Filled 2016-03-10 (×5): qty 200

## 2016-03-10 MED ORDER — HEPARIN SODIUM (PORCINE) 5000 UNIT/ML IJ SOLN
5000.0000 [IU] | Freq: Three times a day (TID) | INTRAMUSCULAR | Status: DC
  Administered 2016-03-10 – 2016-03-11 (×3): 5000 [IU] via SUBCUTANEOUS
  Filled 2016-03-10 (×4): qty 1

## 2016-03-10 MED ORDER — FENTANYL CITRATE (PF) 100 MCG/2ML IJ SOLN
50.0000 ug | INTRAMUSCULAR | Status: DC | PRN
Start: 2016-03-10 — End: 2016-03-10
  Administered 2016-03-10: 50 ug via INTRAVENOUS
  Filled 2016-03-10: qty 2

## 2016-03-10 MED ORDER — DIAZEPAM 5 MG PO TABS: 5.0000 mg | ORAL_TABLET | Freq: Three times a day (TID) | ORAL | Status: DC | PRN

## 2016-03-10 MED ORDER — ALBUTEROL SULFATE (2.5 MG/3ML) 0.083% IN NEBU: 3.0000 mL | INHALATION_SOLUTION | Freq: Four times a day (QID) | RESPIRATORY_TRACT | Status: DC | PRN

## 2016-03-10 MED ORDER — DIATRIZOATE MEGLUMINE & SODIUM 66-10 % PO SOLN
ORAL | Status: AC
  Filled 2016-03-10: qty 30

## 2016-03-10 MED ORDER — HYDROMORPHONE HCL 1 MG/ML IJ SOLN
0.5000 mg | INTRAMUSCULAR | Status: DC | PRN
  Administered 2016-03-10: 0.5 mg via INTRAVENOUS
  Filled 2016-03-10: qty 1

## 2016-03-10 MED ORDER — GABAPENTIN 300 MG PO CAPS
300.0000 mg | ORAL_CAPSULE | Freq: Three times a day (TID) | ORAL | Status: DC
  Administered 2016-03-11 – 2016-03-13 (×5): 300 mg via ORAL
  Filled 2016-03-10 (×6): qty 1

## 2016-03-10 MED ORDER — METRONIDAZOLE IN NACL 5-0.79 MG/ML-% IV SOLN
500.0000 mg | Freq: Once | INTRAVENOUS | Status: AC
  Administered 2016-03-10: 500 mg via INTRAVENOUS
  Filled 2016-03-10: qty 100

## 2016-03-10 MED ORDER — SODIUM CHLORIDE 0.9 % IV SOLN
INTRAVENOUS | Status: DC
  Administered 2016-03-10: 14:00:00 via INTRAVENOUS

## 2016-03-10 MED ORDER — SODIUM CHLORIDE 0.9 % IV SOLN: INTRAVENOUS | Status: DC

## 2016-03-10 MED ORDER — IOPAMIDOL (ISOVUE-300) INJECTION 61%
100.0000 mL | Freq: Once | INTRAVENOUS | Status: AC | PRN
  Administered 2016-03-10: 100 mL via INTRAVENOUS

## 2016-03-10 MED ORDER — AMLODIPINE BESYLATE 5 MG PO TABS
5.0000 mg | ORAL_TABLET | Freq: Every day | ORAL | Status: DC
  Administered 2016-03-11 – 2016-03-13 (×2): 5 mg via ORAL
  Filled 2016-03-10 (×2): qty 1

## 2016-03-10 MED ORDER — MORPHINE SULFATE (PF) 4 MG/ML IV SOLN
4.0000 mg | INTRAVENOUS | Status: DC | PRN
  Administered 2016-03-10: 4 mg via INTRAVENOUS
  Filled 2016-03-10: qty 1

## 2016-03-10 MED ORDER — OXYCODONE-ACETAMINOPHEN 5-325 MG PO TABS
2.0000 | ORAL_TABLET | ORAL | Status: DC | PRN
  Administered 2016-03-11 – 2016-03-13 (×5): 2 via ORAL
  Filled 2016-03-10 (×5): qty 2

## 2016-03-10 NOTE — ED Provider Notes (Signed)
CSN: [id number]     Arrival date & time 03/10/16  1255 History   First MD Initiated Contact with Patient 03/10/16 1308     Chief Complaint  Patient presents with  . Abdominal Pain      HPI Pt was seen at 1310.  Per pt, c/o gradual onset and persistence of constant RUQ abd "pain" since 0400 this morning.  Has been associated with multiple intermittent episodes of N/V.  Describes the abd pain as "cramping."  Denies diarrhea, no fevers, no back pain, no rash, no CP/SOB, no black or blood in stools or emesis. Pt was d/c from the hospital yesterday for these symptoms, told she "needed my gallbladder out." Pt did not fill her discharge rx.     Past Medical History  Diagnosis Date  . Arthritis   . Bronchitis   . Hypertension   . Chronic back pain   . Chronic knee pain   . COPD (chronic obstructive pulmonary disease) (HCC)   . Memory difficulty 2015  . Weakness of both legs   . 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 Pain management   . Frequent falls   . Dizziness   . 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 Prediabetes   . 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 Knee cartilage surgery Right 2007  . Appendectomy  1971  . Tonsillectomy  1971   Family History  Problem Relation Age of Onset  . Heart failure Father   . Diabetes Father   . Kidney failure Father    Social History  Substance Use Topics  . Smoking status: Current Every Day Smoker -- 0.50 packs/day for 10 years    Types: Cigarettes    Start date: 08/05/1970  . Smokeless tobacco: Never Used     Comment: 04/30/15 less than 1 PPD  . Alcohol Use: 0.0 oz/week    0 Standard drinks or equivalent per week     Comment: Very rare   OB History    Gravida Para Term Preterm AB TAB SAB Ectopic Multiple Living   2 2 2             Review of Systems ROS: Statement: All  systems negative except as marked or noted in the HPI; Constitutional: Negative for fever and chills. ; ; Eyes: Negative for eye pain, redness and discharge. ; ; ENMT: Negative for ear pain, hoarseness, nasal congestion, sinus pressure and sore throat. ; ; Cardiovascular: Negative for chest pain, palpitations, diaphoresis, dyspnea and peripheral edema. ; ; Respiratory: Negative for cough, wheezing and stridor. ; ; Gastrointestinal: +N/V, abd pain. Negative for diarrhea, blood in stool, hematemesis, jaundice and rectal bleeding. . ; ; Genitourinary: Negative for dysuria, flank pain and hematuria. ; ; Musculoskeletal: Negative for back pain and neck pain. Negative for swelling and trauma.; ; Skin: Negative for pruritus, rash, abrasions, blisters, bruising and skin lesion.; ; Neuro: Negative for headache, lightheadedness and neck stiffness. Negative for weakness, altered level of consciousness, altered mental status, extremity weakness, paresthesias, involuntary movement, seizure and syncope.      Allergies  Bee venom; Mushroom extract complex; Amitriptyline; Toradol; Tramadol; and Trazodone and nefazodone  Home Medications   Prior to Admission medications   Medication Sig Start Date End Date Taking? Authorizing Provider  albuterol (PROVENTIL HFA;VENTOLIN HFA) 108 (90 BASE) MCG/ACT inhaler Inhale 2 puffs into the lungs every 6 (six) hours  as needed for wheezing. 06/21/13   Nimish Normajean Glasgow, MD  amLODipine (NORVASC) 2.5 MG tablet Take 2 tablets (5 mg total) by mouth daily. 06/30/15   Otis Brace, MD  aspirin EC 81 MG tablet Take 1 tablet (81 mg total) by mouth daily. 12/22/15   Henderson Cloud, MD  Cholecalciferol (VITAMIN D) 2000 units CAPS Take 1 capsule by mouth daily.    Historical Provider, MD  ciprofloxacin (CIPRO) 500 MG tablet Take 1 tablet (500 mg total) by mouth 2 (two) times daily. 03/09/16   Henderson Cloud, MD  cyclobenzaprine (FLEXERIL) 10 MG tablet Take 10 mg by mouth 3  (three) times daily as needed for muscle spasms.    Historical Provider, MD  diazepam (VALIUM) 5 MG tablet Take 5 mg by mouth 3 (three) times daily as needed for anxiety.    Historical Provider, MD  DULoxetine (CYMBALTA) 60 MG capsule Take 60 mg by mouth at bedtime.     Historical Provider, MD  EVZIO 0.4 MG/0.4ML SOAJ Inject 0.4 mLs into the muscle once as needed (for Anaphylaxis/allergic reaction).  02/19/15   Historical Provider, MD  gabapentin (NEURONTIN) 300 MG capsule Take 300 mg by mouth 3 (three) times daily.    Historical Provider, MD  hydrOXYzine (VISTARIL) 25 MG capsule Take 25 mg by mouth at bedtime.    Historical Provider, MD  metFORMIN (GLUCOPHAGE-XR) 500 MG 24 hr tablet Take 500 mg by mouth at bedtime.  12/15/14   Historical Provider, MD  metoprolol succinate (TOPROL-XL) 50 MG 24 hr tablet Take 50 mg by mouth at bedtime. 12/14/15   Historical Provider, MD  omeprazole (PRILOSEC) 20 MG capsule Take 1 capsule (20 mg total) by mouth daily. 06/30/15   Otis Brace, MD  oxyCODONE-acetaminophen (PERCOCET) 10-325 MG tablet Take 1 tablet by mouth every 6 (six) hours as needed for pain.    Historical Provider, MD  VOLTAREN 1 % GEL Apply 4 g topically every 8 (eight) hours.  02/06/15   Historical Provider, MD   BP 149/61 mmHg  Pulse 72  Temp(Src) 98.1 F (36.7 C) (Oral)  Resp 18  Ht  (1.626 m)  Wt 223 lb (101.152 kg)  BMI 38.26 kg/m2  SpO2 98% Physical Exam  1315: Physical examination:  Nursing notes reviewed; Vital signs and O2 SAT reviewed;  Constitutional: Well developed, Well nourished, Well hydrated, In no acute distress; Head:  Normocephalic, atraumatic; Eyes: EOMI, PERRL, No scleral icterus; ENMT: Mouth and pharynx normal, Mucous membranes moist; Neck: Supple, Full range of motion, No lymphadenopathy; Cardiovascular: Regular rate and rhythm, No gallop; Respiratory: Breath sounds clear & equal bilaterally, No wheezes.  Speaking full sentences with ease, Normal respiratory  effort/excursion; Chest: Nontender, Movement normal; Abdomen: Soft, +RUQ tenderness to palp. Nondistended, Normal bowel sounds; Genitourinary: No CVA tenderness; Extremities: Pulses normal, No tenderness, No edema, No calf edema or asymmetry.; Neuro: AA&Ox3, vague historian per baseline. Major CN grossly intact.  Speech clear. No gross focal motor or sensory deficits in extremities.; Skin: Color normal, Warm, Dry.     ED Course  Procedures (including critical care time) Labs Review   Imaging Review  I have personally reviewed and evaluated these images and lab results as part of my medical decision-making.   EKG Interpretation   Date/Time:  Monday Mar 10 2016 13:29:25 EDT Ventricular Rate:  71 PR Interval:  145 QRS Duration: 88 QT Interval:  405 QTC Calculation: 440 R Axis:   47 Text Interpretation:  Sinus rhythm Low voltage,  precordial leads  Anteroseptal infarct, old Baseline wander When compared with ECG of  03/07/2016 No significant change was found Confirmed by [facility]  MD,  Nicholos Johns [phone]) on 03/10/2016 1:37:38 PM      MDM  MDM Reviewed: previous chart, nursing note and vitals Reviewed previous: labs, ECG and ultrasound Interpretation: labs, ECG, x-ray and CT scan     Results for orders placed or performed during the hospital encounter of 03/10/16  Comprehensive metabolic panel  Result Value Ref Range   Sodium 140 135 - 145 mmol/L   Potassium 4.0 3.5 - 5.1 mmol/L   Chloride 104 101 - 111 mmol/L   CO2 25 22 - 32 mmol/L   Glucose, Bld 111 (H) 65 - 99 mg/dL   BUN 7 6 - 20 mg/dL   Creatinine, Ser 6.57 0.44 - 1.00 mg/dL   Calcium 9.5 8.9 - 84.6 mg/dL   Total Protein 7.3 6.5 - 8.1 g/dL   Albumin 3.6 3.5 - 5.0 g/dL   AST 15 15 - 41 U/L   ALT 11 (L) 14 - 54 U/L   Alkaline Phosphatase 86 38 - 126 U/L   Total Bilirubin 0.5 0.3 - 1.2 mg/dL   GFR calc non Af Amer >60 >60 mL/min   GFR calc Af Amer >60 >60 mL/min   Anion gap 11 5 - 15  Lipase, blood  Result Value Ref  Range   Lipase 11 11 - 51 U/L  Troponin I  Result Value Ref Range   Troponin I <0.03 <0.031 ng/mL  Lactic acid, plasma  Result Value Ref Range   Lactic Acid, Venous 1.4 0.5 - 2.0 mmol/L  Lactic acid, plasma  Result Value Ref Range   Lactic Acid, Venous 1.2 0.5 - 2.0 mmol/L  CBC with Differential  Result Value Ref Range   WBC 11.1 (H) 4.0 - 10.5 K/uL   RBC 4.91 3.87 - 5.11 MIL/uL   Hemoglobin 14.5 12.0 - 15.0 g/dL   HCT 96.2 95.2 - 84.1 %   MCV 91.9 78.0 - 100.0 fL   MCH 29.5 26.0 - 34.0 pg   MCHC 32.2 30.0 - 36.0 g/dL   RDW 32.4 40.1 - 02.7 %   Platelets 266 150 - 400 K/uL   Neutrophils Relative % 62 %   Neutro Abs 6.8 1.7 - 7.7 K/uL   Lymphocytes Relative 30 %   Lymphs Abs 3.4 0.7 - 4.0 K/uL   Monocytes Relative 7 %   Monocytes Absolute 0.8 0.1 - 1.0 K/uL   Eosinophils Relative 1 %   Eosinophils Absolute 0.1 0.0 - 0.7 K/uL   Basophils Relative 0 %   Basophils Absolute 0.0 0.0 - 0.1 K/uL  Urinalysis, Routine w reflex microscopic  Result Value Ref Range   Color, Urine YELLOW YELLOW   APPearance CLEAR CLEAR   Specific Gravity, Urine 1.015 1.005 - 1.030   pH 8.5 (H) 5.0 - 8.0   Glucose, UA NEGATIVE NEGATIVE mg/dL   Hgb urine dipstick NEGATIVE NEGATIVE   Bilirubin Urine NEGATIVE NEGATIVE   Ketones, ur NEGATIVE NEGATIVE mg/dL   Protein, ur NEGATIVE NEGATIVE mg/dL   Nitrite NEGATIVE NEGATIVE   Leukocytes, UA NEGATIVE NEGATIVE   Dg Chest 2 View 03/10/2016  CLINICAL DATA:  Nausea and vomiting.  Abdominal pain EXAM: CHEST  2 VIEW COMPARISON:  03/07/2016 FINDINGS: The heart size and mediastinal contours are within normal limits. Both lungs are clear. The visualized skeletal structures are unremarkable. IMPRESSION: No active cardiopulmonary disease. Electronically Signed   By: Leonette Most  Chestine Spore M.D.   On: 03/10/2016 17:41   Ct Abdomen Pelvis W Contrast 03/10/2016  CLINICAL DATA:  Acute upper abdominal pain. EXAM: CT ABDOMEN AND PELVIS WITH CONTRAST TECHNIQUE: Multidetector CT  imaging of the abdomen and pelvis was performed using the standard protocol following bolus administration of intravenous contrast. CONTRAST:  ISOVUE-300 IOPAMIDOL (ISOVUE-300) INJECTION 61% COMPARISON:  Ultrasound of Mar 07, 2016. FINDINGS: Visualized lung bases are unremarkable. No significant osseous abnormality is noted. Fatty infiltration of the liver is noted with sparing around the gallbladder fossa. The spleen and pancreas appear normal. Large calculus is noted in gallbladder neck with mild gallbladder wall thickening concerning for acute cholecystitis. Adrenal glands and kidneys appear normal. No hydronephrosis or renal obstruction is noted. Atherosclerosis of abdominal aorta is noted without aneurysm formation. There is no evidence of bowel obstruction. No abnormal fluid collection is noted. Urinary bladder appears normal. Uterus and ovaries appear normal. No significant adenopathy is noted. IMPRESSION: Fatty infiltration of the liver is noted. Large solitary gallstone noted in neck of gallbladder. Mild gallbladder wall thickening is noted concerning for acute cholecystitis. HIDA scan may be performed for further evaluation. Electronically Signed   By: Lupita Raider, M.D.   On: 03/10/2016 17:19    1820:  Pt continues to c/o pain and nausea despite multiple doses of IV meds for same. Will remedicate. Labs reassuring. CT scan with continued acute cholecystitis; IV abx started. T/C to General Surgery Dr. Earlene Plater, case discussed, including:  HPI, pertinent PM/SHx, VS/PE, dx testing, ED course and treatment:  Agreeable to consult, pt will need HIDA scan, requests to admit to medicine service.   1920:  T/C to [facility] Dr. Conley Rolls, case discussed, including:  HPI, pertinent PM/SHx, VS/PE, dx testing, ED course and treatment:  Agreeable to admit, requests to write temporary orders, obtain medical bed to team APAdmits.   Samuel Jester, DO 03/12/16 2126

## 2016-03-10 NOTE — ED Notes (Signed)
Pt states she was given Rx's when she was discharged yesterday but she did not get then filled.

## 2016-03-10 NOTE — H&P (Signed)
History and Physical    Carla [name]:[id number] DOB: 1951-08-13 DOA: 03/10/2016  Referring MD/NP/PA: Samuel Jester, DO PCP: [name], MD  Outpatient Specialists: General surgery, Dr. Lovell Sheehan.  Patient coming from: Home via EMS  Chief Complaint: Abdominal pain  HPI: Carla [name] is a 65 y.o. female with medical history significant of prediabetes, HTNM COPD, presented with complaints of abdominal pain. Pain onset 0400 this morning has been gradual and persistent and is described as cramping. She also complains of associated diarrhea, poor oral intake, nausea and vomiting. She reported falling out on the kitchen floor and daughter called EMS. No reports of LOC or trauma from hall. Of note patient was discharged today for abdominal pain with oral pain management. Patient reported she did not fill her given outpatient prescription.  Patient lives with daughter, son-in-law and grandchildren.   ED Course: Afebrile, CMP, UA, and BMP unremarkable. CBC with mild WBC elevation, otherwise unremarkable. Glucose 111. CT A/P revealed acute cholecystitis. After EDP spoken with Dr Earlene Plater of surgery, he recommended HIDA scan tomorrow. She has been referred for admission. General surgery consulted to evaluate in the morning.   Review of Systems: As per HPI otherwise 10 point review of systems negative.   Past Medical History  Diagnosis Date  . Arthritis   . Bronchitis   . Hypertension   . Chronic back pain   . Chronic knee pain   . COPD (chronic obstructive pulmonary disease) (HCC)   . Memory difficulty 2015  . Weakness of both legs   . 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 Pain management   . Frequent falls   . Dizziness   . 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 Prediabetes   . 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 Knee  cartilage surgery Right 2007  . Appendectomy  1971  . Tonsillectomy  1971     reports that she has been smoking Cigarettes.  She started smoking about 45 years ago. She has a 5 pack-year smoking history. She has never used smokeless tobacco. She reports that she drinks alcohol. She reports that she does not use illicit drugs.  Allergies  Allergen Reactions  . Bee Venom Anaphylaxis  . Mushroom Extract Complex Anaphylaxis    Extreme sweating, vomiting  . Amitriptyline Other (See Comments)    Has nightmares when using, not in right state of mind   . Toradol [Ketorolac Tromethamine] Nausea And Vomiting  . Tramadol Nausea And Vomiting  . Trazodone And Nefazodone Nausea And Vomiting    Family History  Problem Relation Age of Onset  . Heart failure Father   . Diabetes Father   . Kidney failure Father    Unacceptable: Noncontributory, unremarkable, or negative. Acceptable: Family history reviewed and not pertinent (If you reviewed it)  Prior to Admission medications   Medication Sig Start Date End Date Taking? Authorizing Provider  albuterol (PROVENTIL HFA;VENTOLIN HFA) 108 (90 BASE) MCG/ACT inhaler Inhale 2 puffs into the lungs every 6 (six) hours as needed for wheezing. 06/21/13  Yes Nimish Normajean Glasgow, MD  amLODipine (NORVASC) 2.5 MG tablet Take 2 tablets (5 mg total) by mouth daily. 06/30/15  Yes Marjan Rabbani, MD  aspirin EC 81 MG tablet Take 1 tablet (81 mg total) by mouth daily. 12/22/15  Yes Estela Isaiah Blakes, MD  Cholecalciferol (VITAMIN D) 2000 units CAPS Take 1 capsule  by mouth daily.   Yes Historical Provider, MD  cyclobenzaprine (FLEXERIL) 10 MG tablet Take 10 mg by mouth 3 (three) times daily as needed for muscle spasms.   Yes Historical Provider, MD  diazepam (VALIUM) 5 MG tablet Take 5 mg by mouth 3 (three) times daily as needed for anxiety.   Yes Historical Provider, MD  dimenhyDRINATE (DRAMAMINE) 50 MG tablet Take 50 mg by mouth every 8 (eight) hours as needed for  dizziness.   Yes Historical Provider, MD  DULoxetine (CYMBALTA) 60 MG capsule Take 60 mg by mouth at bedtime.    Yes Historical Provider, MD  EVZIO 0.4 MG/0.4ML SOAJ Inject 0.4 mLs into the muscle once as needed (for Anaphylaxis/allergic reaction).  02/19/15  Yes Historical Provider, MD  gabapentin (NEURONTIN) 300 MG capsule Take 300 mg by mouth 3 (three) times daily.   Yes Historical Provider, MD  hydrOXYzine (VISTARIL) 25 MG capsule Take 25 mg by mouth at bedtime.   Yes Historical Provider, MD  metFORMIN (GLUCOPHAGE-XR) 500 MG 24 hr tablet Take 500 mg by mouth daily with breakfast.  12/15/14  Yes Historical Provider, MD  metoprolol succinate (TOPROL-XL) 50 MG 24 hr tablet Take 50 mg by mouth at bedtime. 12/14/15  Yes Historical Provider, MD  omeprazole (PRILOSEC) 20 MG capsule Take 1 capsule (20 mg total) by mouth daily. 06/30/15  Yes Otis Brace, MD  oxyCODONE-acetaminophen (PERCOCET) 10-325 MG tablet Take 1 tablet by mouth every 6 (six) hours as needed for pain.   Yes Historical Provider, MD  VOLTAREN 1 % GEL Apply 4 g topically every 8 (eight) hours.  02/06/15  Yes Historical Provider, MD  ciprofloxacin (CIPRO) 500 MG tablet Take 1 tablet (500 mg total) by mouth 2 (two) times daily. 03/09/16   Henderson Cloud, MD    Physical Exam: Filed Vitals:   03/10/16 1700 03/10/16 1715 03/10/16 1900 03/10/16 1902  BP:   152/81 149/62  Pulse: 58 53 60 59  Temp:      TempSrc:      Resp: Height:      Weight:      SpO2: 100% 98% 98% 98%      Constitutional: NAD, calm, comfortable Filed Vitals:   03/10/16 1700 03/10/16 1715 03/10/16 1900 03/10/16 1902  BP:   152/81 149/62  Pulse: 58 53 60 59  Temp:      TempSrc:      Resp: Height:      Weight:      SpO2: 100% 98% 98% 98%   Respiratory: clear to auscultation bilaterally, no wheezing, no crackles. Normal respiratory effort. No accessory muscle use.  Cardiovascular: Regular rate and rhythm, no murmurs / rubs /  gallops. No extremity edema. 2+ pedal pulses. No carotid bruits.  Abdomen: mild epigastric tenderness. , no masses palpated. No hepatosplenomegaly. Bowel sounds positive.  Musculoskeletal: no clubbing / cyanosis. No joint deformity upper and lower extremities. Good ROM, no contractures. Normal muscle tone.  Skin: no rashes, lesions, ulcers. No induration Neurologic: CN 2-12 grossly intact. Sensation intact, DTR normal. Strength 5/5 in all 4.  Psychiatric: Normal judgment and insight. Alert and oriented x 3. Normal mood.   Labs on Admission: I have personally reviewed following labs and imaging studies  CBC:  Recent Labs Lab 03/07/16 0634 03/08/16 0637 03/09/16 0656 03/10/16 1338  WBC 18.2* 13.8* 11.8* 11.1*  NEUTROABS 12.2*  --   --  6.8  HGB 15.6* 16.1* 14.5 14.5  HCT 47.3* 50.5* 46.1* 45.1  MCV 91.1 93.0 92.4 91.9  PLT 284 237 260 266   Basic Metabolic Panel:  Recent Labs Lab 03/07/16 0634 03/08/16 0637 03/09/16 0656 03/10/16 1338  NA 135 137 136 140  K 3.5 4.1 3.8 4.0  CL 101 99* 102 104  CO2 24 27 27 25   GLUCOSE 140* 110* 102* 111*  BUN 15 9 9 7   CREATININE 0.65 0.53 0.53 0.55  CALCIUM 9.1 9.1 8.4* 9.5   GFR: Estimated Creatinine Clearance: 84.4 mL/min (by C-G formula based on Cr of 0.55). Liver Function Tests:  Recent Labs Lab 03/07/16 0634 03/08/16 0637 03/10/16 1338  AST 17 21 15   ALT 13* 12* 11*  ALKPHOS 95 94 86  BILITOT 0.4 0.9 0.5  PROT 7.4 7.0 7.3  ALBUMIN 4.1 3.6 3.6    Recent Labs Lab 03/07/16 0634 03/10/16 1338  LIPASE 12 11     Recent Labs Lab 03/07/16 0634 03/10/16 1338  TROPONINI <0.03 <0.03     Recent Labs  03/08/16 1006  FREET4 1.14*   Urine analysis:    Component Value Date/Time   COLORURINE YELLOW 03/10/2016 1450   APPEARANCEUR CLEAR 03/10/2016 1450   LABSPEC 1.015 03/10/2016 1450   PHURINE 8.5* 03/10/2016 1450   GLUCOSEU NEGATIVE 03/10/2016 1450   HGBUR NEGATIVE 03/10/2016 1450   BILIRUBINUR NEGATIVE  03/10/2016 1450   KETONESUR NEGATIVE 03/10/2016 1450   PROTEINUR NEGATIVE 03/10/2016 1450   UROBILINOGEN 0.2 06/28/2015 1940   NITRITE NEGATIVE 03/10/2016 1450   LEUKOCYTESUR NEGATIVE 03/10/2016 1450   Sepsis Labs: @LABRCNTIP (procalcitonin:4,lacticidven:4) ) Recent Results (from the past 240 hour(s))  Urine culture     Status: Abnormal   Collection Time: 03/07/16 11:00 AM  Result Value Ref Range Status   Specimen Description URINE, CLEAN CATCH  Final   Special Requests Normal  Final   Culture MULTIPLE SPECIES PRESENT, SUGGEST RECOLLECTION (A)  Final   Report Status 03/09/2016 FINAL  Final     Radiological Exams on Admission: Dg Chest 2 View  03/10/2016  CLINICAL DATA:  Nausea and vomiting.  Abdominal pain EXAM: CHEST  2 VIEW COMPARISON:  03/07/2016 FINDINGS: The heart size and mediastinal contours are within normal limits. Both lungs are clear. The visualized skeletal structures are unremarkable. IMPRESSION: No active cardiopulmonary disease. Electronically Signed   By: Marlan Palau M.D.   On: 03/10/2016 17:41   Ct Abdomen Pelvis W Contrast  03/10/2016  CLINICAL DATA:  Acute upper abdominal pain. EXAM: CT ABDOMEN AND PELVIS WITH CONTRAST TECHNIQUE: Multidetector CT imaging of the abdomen and pelvis was performed using the standard protocol following bolus administration of intravenous contrast. CONTRAST:  ISOVUE-300 IOPAMIDOL (ISOVUE-300) INJECTION 61% COMPARISON:  Ultrasound of Mar 07, 2016. FINDINGS: Visualized lung bases are unremarkable. No significant osseous abnormality is noted. Fatty infiltration of the liver is noted with sparing around the gallbladder fossa. The spleen and pancreas appear normal. Large calculus is noted in gallbladder neck with mild gallbladder wall thickening concerning for acute cholecystitis. Adrenal glands and kidneys appear normal. No hydronephrosis or renal obstruction is noted. Atherosclerosis of abdominal aorta is noted without aneurysm formation.  There is no evidence of bowel obstruction. No abnormal fluid collection is noted. Urinary bladder appears normal. Uterus and ovaries appear normal. No significant adenopathy is noted. IMPRESSION: Fatty infiltration of the liver is noted. Large solitary gallstone noted in neck of gallbladder. Mild gallbladder wall thickening is noted concerning for acute cholecystitis. HIDA scan may be performed for further evaluation. Electronically Signed  By: Lupita Raider, M.D.   On: 03/10/2016 17:19     Assessment/Plan 1. Acute cholecystitis, revealed on CT scan.  Plan for HIDA scan and general surgery consult in the morning. NPO, IVF and IV Cipro and Flagyl.  2. Epigastric abdominal pain with associated intractable nausea and vomiting. Continue supportive treatment with antiemetics and pain management.   3. Essential HTN, stable. Continue home medications.  4. COPD. Stable.    DVT prophylaxis: Lovenox Code Status: Full Family Communication: No family bedside.  Disposition Plan: Admit to medical bed.  Consults called: General surgery Admission status: Observation   Dr. Houston Siren, MD  FACP.  Hospitalist.    If 7PM-7AM, please contact night-coverage [email or web address] Password [email or web address]  03/10/2016, 7:09 PM    By signing my name below, I, Zadie Cleverly, attest that this documentation has been prepared under the direction and in the presence of Houston Siren, MD. Electronically signed: Zadie Cleverly, Scribe. 03/10/2016 7:44pm  I, Dr. Houston Siren, personally performed the services described in this documentation. All medical record entries made by the scribe were at my discretion and in my presence. Houston Siren, MD 03/10/2016

## 2016-03-10 NOTE — ED Notes (Signed)
Pt complain of abdominal pain and nausea. States she was just discharged from the hospital yesterday for same. States she was told she needed her gallbladder out.

## 2016-03-11 ENCOUNTER — Observation Stay (HOSPITAL_COMMUNITY): Payer: Medicare Other

## 2016-03-11 DIAGNOSIS — Z7982 Long term (current) use of aspirin: Secondary | ICD-10-CM | POA: Diagnosis not present

## 2016-03-11 DIAGNOSIS — R7303 Prediabetes: Secondary | ICD-10-CM | POA: Diagnosis present

## 2016-03-11 DIAGNOSIS — G8929 Other chronic pain: Secondary | ICD-10-CM | POA: Diagnosis present

## 2016-03-11 DIAGNOSIS — E876 Hypokalemia: Secondary | ICD-10-CM | POA: Diagnosis not present

## 2016-03-11 DIAGNOSIS — Z6838 Body mass index (BMI) 38.0-38.9, adult: Secondary | ICD-10-CM | POA: Diagnosis not present

## 2016-03-11 DIAGNOSIS — K8000 Calculus of gallbladder with acute cholecystitis without obstruction: Secondary | ICD-10-CM | POA: Diagnosis not present

## 2016-03-11 DIAGNOSIS — F17210 Nicotine dependence, cigarettes, uncomplicated: Secondary | ICD-10-CM | POA: Diagnosis present

## 2016-03-11 DIAGNOSIS — K8012 Calculus of gallbladder with acute and chronic cholecystitis without obstruction: Secondary | ICD-10-CM | POA: Diagnosis present

## 2016-03-11 DIAGNOSIS — K810 Acute cholecystitis: Secondary | ICD-10-CM | POA: Diagnosis not present

## 2016-03-11 DIAGNOSIS — R55 Syncope and collapse: Secondary | ICD-10-CM | POA: Diagnosis not present

## 2016-03-11 DIAGNOSIS — J449 Chronic obstructive pulmonary disease, unspecified: Secondary | ICD-10-CM | POA: Diagnosis present

## 2016-03-11 DIAGNOSIS — M549 Dorsalgia, unspecified: Secondary | ICD-10-CM | POA: Diagnosis present

## 2016-03-11 DIAGNOSIS — I10 Essential (primary) hypertension: Secondary | ICD-10-CM | POA: Diagnosis present

## 2016-03-11 DIAGNOSIS — Z888 Allergy status to other drugs, medicaments and biological substances status: Secondary | ICD-10-CM | POA: Diagnosis not present

## 2016-03-11 DIAGNOSIS — K821 Hydrops of gallbladder: Secondary | ICD-10-CM | POA: Diagnosis present

## 2016-03-11 DIAGNOSIS — R1011 Right upper quadrant pain: Secondary | ICD-10-CM | POA: Diagnosis present

## 2016-03-11 DIAGNOSIS — R1110 Vomiting, unspecified: Secondary | ICD-10-CM | POA: Diagnosis not present

## 2016-03-11 DIAGNOSIS — F329 Major depressive disorder, single episode, unspecified: Secondary | ICD-10-CM | POA: Diagnosis present

## 2016-03-11 LAB — BASIC METABOLIC PANEL
Anion gap: 9 (ref 5–15)
BUN: 7 mg/dL (ref 6–20)
CALCIUM: 8.7 mg/dL — AB (ref 8.9–10.3)
CO2: 28 mmol/L (ref 22–32)
CREATININE: 0.59 mg/dL (ref 0.44–1.00)
Chloride: 103 mmol/L (ref 101–111)
Glucose, Bld: 97 mg/dL (ref 65–99)
Potassium: 3.4 mmol/L — ABNORMAL LOW (ref 3.5–5.1)
SODIUM: 140 mmol/L (ref 135–145)

## 2016-03-11 LAB — CBC
HCT: 44 % (ref 36.0–46.0)
Hemoglobin: 14.2 g/dL (ref 12.0–15.0)
MCH: 29.7 pg (ref 26.0–34.0)
MCHC: 32.3 g/dL (ref 30.0–36.0)
MCV: 92.1 fL (ref 78.0–100.0)
PLATELETS: 268 10*3/uL (ref 150–400)
RBC: 4.78 MIL/uL (ref 3.87–5.11)
RDW: 14.5 % (ref 11.5–15.5)
WBC: 10.9 10*3/uL — AB (ref 4.0–10.5)

## 2016-03-11 LAB — HEMOGLOBIN A1C
HEMOGLOBIN A1C: 6.5 % — AB (ref 4.8–5.6)
MEAN PLASMA GLUCOSE: 140 mg/dL

## 2016-03-11 MED ORDER — MORPHINE SULFATE (PF) 4 MG/ML IV SOLN
3.0000 mg | Freq: Once | INTRAVENOUS | Status: AC
  Administered 2016-03-11: 3 mg via INTRAVENOUS
  Filled 2016-03-11: qty 1

## 2016-03-11 MED ORDER — PROCHLORPERAZINE EDISYLATE 5 MG/ML IJ SOLN
10.0000 mg | INTRAMUSCULAR | Status: DC | PRN
Start: 2016-03-11 — End: 2016-03-13

## 2016-03-11 MED ORDER — TECHNETIUM TC 99M MEBROFENIN IV KIT
5.0000 | PACK | Freq: Once | INTRAVENOUS | Status: AC | PRN
  Administered 2016-03-11: 4.83 via INTRAVENOUS

## 2016-03-11 MED ORDER — DIPHENHYDRAMINE HCL 50 MG/ML IJ SOLN
25.0000 mg | Freq: Once | INTRAMUSCULAR | Status: AC
  Administered 2016-03-11: 25 mg via INTRAVENOUS
  Filled 2016-03-11: qty 1

## 2016-03-11 NOTE — Progress Notes (Signed)
PROGRESS NOTE    Carla [name]  ZOX:[id number] DOB: 25-Jun-1951 DOA: 03/10/2016 PCP: [name], MD    Brief Narrative: 65 y.o. female with medical history significant of prediabetes, HTNM COPD, presented with complaints of abdominal pain. Pain onset 0400 on the morning of hospital admission on and has been gradual and persistent and is described as cramping. She also complains of associated diarrhea, poor oral intake, nausea and vomiting. She reported falling out on the kitchen floor and daughter called EMS. No reports of LOC or trauma from hall. Of note patient was discharged on 03/10/2016 for abdominal pain with oral pain management. Patient reported she did not fill her given outpatient prescription. Patient lives with daughter, son-in-law and grandchildren   Assessment & Plan:   Active Problems:   COPD (chronic obstructive pulmonary disease) (HCC)   Cholecystitis, acute   Obesity, morbid (HCC)   Cholelithiasis   Syncope and collapse   Intractable nausea and vomiting   Abdominal pain   1. Acute cholecystitis, noted on CT abdomen. Patient is currently undergoing HIDA scan, pending results. General surgery has been consulted. Originally, patient was recommended to follow-up with Dr. Lovell Sheehan as an outpatient. 2. Epigastric abdominal pain with associated intractable nausea and vomiting. Continue supportive treatment with antiemetics and pain management.  3. Essential HTN, currently stable. Continue home medications.  4. COPD. currently stable   DVT prophylaxis: Heparin subcutaneous Code Status: Full Family Communication: Patient in room Disposition Plan: Uncertain at this time   Consultants:   General surgery  Procedures:     Antimicrobials:  Antibiotics Given (last 72 hours)    Date/Time Action Medication Dose Rate   03/11/16 0327 Given   metroNIDAZOLE (FLAGYL) IVPB 500 mg 500 mg 100 mL/hr           Subjective: Feeling mildly  nauseated  Objective: Filed Vitals:   03/10/16 2045 03/10/16 2120 03/11/16 0307 03/11/16 0543  BP:  173/62 137/61 148/96  Pulse: 54 56 54 63  Temp:  97.7 F (36.5 C) 97.4 F (36.3 C) 97.4 F (36.3 C)  TempSrc:  Oral  Oral  Resp: 12  16 18   Height:  5\' 4"  (1.626 m)    Weight:  100.018 kg (220 lb 8 oz)    SpO2: 99% 97% 97% 98%    Intake/Output Summary (Last 24 hours) at 03/11/16 1353 Last data filed at 03/11/16 0930  Gross per 24 hour  Intake      0 ml  Output      0 ml  Net      0 ml   Filed Weights   03/10/16 1259 03/10/16 2120  Weight: 101.152 kg (223 lb) 100.018 kg (220 lb 8 oz)    Examination:  General exam: Appears calm and comfortable, Lying in bed Respiratory system: Clear to auscultation. Respiratory effort normal. Cardiovascular system: S1 & S2 heard, RRR.  Gastrointestinal system: Abdomen is nondistended, . No organomegaly or masses felt. Normal bowel sounds heard. Central nervous system: Alert and oriented. No focal neurological deficits. Extremities: Symmetric 5 x 5 power. Skin: No rashes, lesions Psychiatry: Judgement and insight appear normal. Mood & affect appropriate.     Data Reviewed: I have personally reviewed following labs and imaging studies  CBC:  Recent Labs Lab 03/07/16 0634 03/08/16 0637 03/09/16 0656 03/10/16 1338 03/11/16 0559  WBC 18.2* 13.8* 11.8* 11.1* 10.9*  NEUTROABS 12.2*  --   --  6.8  --   HGB 15.6* 16.1* 14.5 14.5 14.2  HCT 47.3* 50.5*  46.1* 45.1 44.0  MCV 91.1 93.0 92.4 91.9 92.1  PLT 284 237 260 266 268   Basic Metabolic Panel:  Recent Labs Lab 03/07/16 0634 03/08/16 0637 03/09/16 0656 03/10/16 1338 03/11/16 0559  NA 135 137 136 140 140  K 3.5 4.1 3.8 4.0 3.4*  CL 101 99* 102 104 103  CO2 GLUCOSE 140* 110* 102* 111* 97  BUN CREATININE 0.65 0.53 0.53 0.55 0.59  CALCIUM 9.1 9.1 8.4* 9.5 8.7*   GFR: Estimated Creatinine Clearance: 83.8 mL/min (by C-G formula based on Cr of  0.59). Liver Function Tests:  Recent Labs Lab 03/07/16 0634 03/08/16 0637 03/10/16 1338  AST ALT 13* 12* 11*  ALKPHOS 95 94 86  BILITOT 0.4 0.9 0.5  PROT 7.4 7.0 7.3  ALBUMIN 4.1 3.6 3.6    Recent Labs Lab 03/07/16 0634 03/10/16 1338  LIPASE 12 11   No results for input(s): AMMONIA in the last 168 hours. Coagulation Profile: No results for input(s): INR, PROTIME in the last 168 hours. Cardiac Enzymes:  Recent Labs Lab 03/07/16 0634 03/10/16 1338  TROPONINI <0.03 <0.03   BNP (last 3 results) No results for input(s): PROBNP in the last 8760 hours. HbA1C: No results for input(s): HGBA1C in the last 72 hours. CBG:  Recent Labs Lab 03/08/16 1626 03/08/16 2058 03/09/16 0750 03/09/16 1151 03/09/16 1537  GLUCAP 77 128* 103* 86 130*   Lipid Profile: No results for input(s): CHOL, HDL, LDLCALC, TRIG, CHOLHDL, LDLDIRECT in the last 72 hours. Thyroid Function Tests:  Recent Labs  03/10/16 1600  TSH 3.083   Anemia Panel: No results for input(s): VITAMINB12, FOLATE, FERRITIN, TIBC, IRON, RETICCTPCT in the last 72 hours. Sepsis Labs:  Recent Labs Lab 03/10/16 1338 03/10/16 1600  LATICACIDVEN 1.4 1.2    Recent Results (from the past 240 hour(s))  Urine culture     Status: Abnormal   Collection Time: 03/07/16 11:00 AM  Result Value Ref Range Status   Specimen Description URINE, CLEAN CATCH  Final   Special Requests Normal  Final   Culture MULTIPLE SPECIES PRESENT, SUGGEST RECOLLECTION (A)  Final   Report Status 03/09/2016 FINAL  Final         Radiology Studies: Dg Chest 2 View  03/10/2016  CLINICAL DATA:  Nausea and vomiting.  Abdominal pain EXAM: CHEST  2 VIEW COMPARISON:  03/07/2016 FINDINGS: The heart size and mediastinal contours are within normal limits. Both lungs are clear. The visualized skeletal structures are unremarkable. IMPRESSION: No active cardiopulmonary disease. Electronically Signed   By: Marlan Palau M.D.   On:  03/10/2016 17:41   Ct Abdomen Pelvis W Contrast  03/10/2016  CLINICAL DATA:  Acute upper abdominal pain. EXAM: CT ABDOMEN AND PELVIS WITH CONTRAST TECHNIQUE: Multidetector CT imaging of the abdomen and pelvis was performed using the standard protocol following bolus administration of intravenous contrast. CONTRAST:  ISOVUE-300 IOPAMIDOL (ISOVUE-300) INJECTION 61% COMPARISON:  Ultrasound of Mar 07, 2016. FINDINGS: Visualized lung bases are unremarkable. No significant osseous abnormality is noted. Fatty infiltration of the liver is noted with sparing around the gallbladder fossa. The spleen and pancreas appear normal. Large calculus is noted in gallbladder neck with mild gallbladder wall thickening concerning for acute cholecystitis. Adrenal glands and kidneys appear normal. No hydronephrosis or renal obstruction is noted. Atherosclerosis of abdominal aorta is noted without aneurysm formation. There is no evidence of bowel obstruction. No abnormal fluid collection  is noted. Urinary bladder appears normal. Uterus and ovaries appear normal. No significant adenopathy is noted. IMPRESSION: Fatty infiltration of the liver is noted. Large solitary gallstone noted in neck of gallbladder. Mild gallbladder wall thickening is noted concerning for acute cholecystitis. HIDA scan may be performed for further evaluation. Electronically Signed   By: Lupita Raider, M.D.   On: 03/10/2016 17:19        Scheduled Meds: . sodium chloride   Intravenous STAT  . amLODipine  5 mg Oral Daily  . aspirin EC  81 mg Oral Daily  . ciprofloxacin  400 mg Intravenous Q12H  . DULoxetine  60 mg Oral QHS  . gabapentin  300 mg Oral TID  . heparin  5,000 Units Subcutaneous Q8H  . metoprolol succinate  50 mg Oral QHS  . metronidazole  500 mg Intravenous Q8H  . pantoprazole  40 mg Oral Daily   Continuous Infusions: . dextrose 5 % and 0.9% NaCl 100 mL/hr at 03/10/16 2240       Jasmeen Fritsch, Scheryl Marten, MD [facility] Pager  [phone]  If 7PM-7AM, please contact night-coverage [email or web address] Password [email or web address] 03/11/2016, 1:53 PM  \

## 2016-03-11 NOTE — Care Management Note (Signed)
Case Management Note  Patient Details  Name: Carla [name] MRN: [id number] Date of Birth: Aug 18, 1951  Subjective/Objective: Spoke with patient for discharge planning,  Patient is from home with daughter and son in law who drive patient to appointments.Has cane walker and Rolator and wheelchair  at home.   Can contact [name] [phone] who is patient contact.Denies issues with medications Uses [facility], PCP is [name], Has used [facility] in the past.             Action/Plan: Home with self care. Family assist.   Expected Discharge Date:                  Expected Discharge Plan:  Home/Self Care  In-House Referral:     Discharge planning Services  CM Consult  Post Acute Care Choice:    Choice offered to:     DME Arranged:    DME Agency:     HH Arranged:    HH Agency:     Status of Service:  In process, will continue to follow  Medicare Important Message Given:    Date Medicare IM Given:    Medicare IM give by:    Date Additional Medicare IM Given:    Additional Medicare Important Message give by:     If discussed at Long Length of Stay Meetings, dates discussed:    Additional Comments:  Adonis Huguenin, RN 03/11/2016, 9:21 AM

## 2016-03-11 NOTE — Consult Note (Signed)
SURGICAL CONSULTATION NOTE (initial)  HISTORY OF PRESENT ILLNESS (HPI):  65 y.o. female presented to [facility] ED with recurrence of vs ongoing epigastric and RUQ abdominal pain x 5 days exacerbated by eating and drinking s/p recent admission (5/26) and discharge (5/28) for essentially the same complaints. Patient also reports a variety of other pain symptoms along with nausea, vomiting, and diarrhea, and she denies worsening of her symptoms during today's HIDA scan with administration of CCK and morphine.  PAST MEDICAL HISTORY (PMH):  Past Medical History  Diagnosis Date  . Arthritis   . Bronchitis   . Hypertension   . Chronic back pain   . Chronic knee pain   . COPD (chronic obstructive pulmonary disease) (HCC)   . Memory difficulty 2015  . Weakness of both legs   . 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 Pain management   . Frequent falls   . Dizziness   . 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 Prediabetes   . Spinal stenosis, lumbar region, 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     PAST SURGICAL HISTORY [facility]):  Past Surgical History  Procedure Laterality Date  . Knee cartilage surgery Right 2007  . Appendectomy  1971  . Tonsillectomy  1971     MEDICATIONS:  Prior to Admission medications   Medication Sig Start Date End Date Taking? Authorizing Provider  albuterol (PROVENTIL HFA;VENTOLIN HFA) 108 (90 BASE) MCG/ACT inhaler Inhale 2 puffs into the lungs every 6 (six) hours as needed for wheezing. 06/21/13  Yes Nimish Normajean Glasgow, MD  amLODipine (NORVASC) 2.5 MG tablet Take 2 tablets (5 mg total) by mouth daily. 06/30/15  Yes Marjan Rabbani, MD  aspirin EC 81 MG tablet Take 1 tablet (81 mg total) by mouth daily. 12/22/15  Yes Henderson Cloud, MD  Cholecalciferol (VITAMIN D) 2000 units CAPS Take 1 capsule by mouth daily.   Yes Historical Provider, MD  cyclobenzaprine (FLEXERIL) 10 MG tablet Take 10 mg by  mouth 3 (three) times daily as needed for muscle spasms.   Yes Historical Provider, MD  diazepam (VALIUM) 5 MG tablet Take 5 mg by mouth 3 (three) times daily as needed for anxiety.   Yes Historical Provider, MD  dimenhyDRINATE (DRAMAMINE) 50 MG tablet Take 50 mg by mouth every 8 (eight) hours as needed for dizziness.   Yes Historical Provider, MD  DULoxetine (CYMBALTA) 60 MG capsule Take 60 mg by mouth at bedtime.    Yes Historical Provider, MD  EVZIO 0.4 MG/0.4ML SOAJ Inject 0.4 mLs into the muscle once as needed (for Anaphylaxis/allergic reaction).  02/19/15  Yes Historical Provider, MD  gabapentin (NEURONTIN) 300 MG capsule Take 300 mg by mouth 3 (three) times daily.   Yes Historical Provider, MD  hydrOXYzine (VISTARIL) 25 MG capsule Take 25 mg by mouth at bedtime.   Yes Historical Provider, MD  metFORMIN (GLUCOPHAGE-XR) 500 MG 24 hr tablet Take 500 mg by mouth daily with breakfast.  12/15/14  Yes Historical Provider, MD  metoprolol succinate (TOPROL-XL) 50 MG 24 hr tablet Take 50 mg by mouth at bedtime. 12/14/15  Yes Historical Provider, MD  omeprazole (PRILOSEC) 20 MG capsule Take 1 capsule (20 mg total) by mouth daily. 06/30/15  Yes Otis Brace, MD  oxyCODONE-acetaminophen (PERCOCET) 10-325 MG tablet Take 1 tablet by mouth every 6 (six) hours as needed for pain.   Yes Historical Provider, MD  VOLTAREN 1 % GEL Apply 4 g topically every 8 (eight) hours.  02/06/15  Yes Historical Provider, MD  ciprofloxacin (CIPRO) 500 MG tablet Take 1 tablet (500 mg total) by mouth 2 (two) times daily. 03/09/16   Henderson Cloud, MD     ALLERGIES:  Allergies  Allergen Reactions  . Bee Venom Anaphylaxis  . Coconut Flavor Anaphylaxis    Per patient  . Mushroom Extract Complex Anaphylaxis    Extreme sweating, vomiting  . Amitriptyline Other (See Comments)    Has nightmares when using, not in right state of mind   . Toradol [Ketorolac Tromethamine] Nausea And Vomiting  . Tramadol Nausea And Vomiting   . Trazodone And Nefazodone Nausea And Vomiting     SOCIAL HISTORY:  Social History   Social History  . Marital Status: Widowed    Spouse Name: N/A  . Number of Children: 2  . Years of Education: 12   Occupational History  . Retired Statistician and [employer]    Social History Main Topics  . Smoking status: Former Smoker -- 0.50 packs/day for 10 years    Types: Cigarettes    Start date: 08/05/1970    Quit date: 03/03/2016  . Smokeless tobacco: Never Used     Comment: 04/30/15 less than 1 PPD  . Alcohol Use: 0.0 oz/week    0 Standard drinks or equivalent per week     Comment: Very rare  . Drug Use: No  . Sexual Activity: Not on file   Other Topics Concern  . Not on file   Social History Narrative   Lives at home with your 2 daughters, son-in-law, 4 grandkids and dogs   Right and left handed   Caffeine use: very rare .Has coke 2 times a week.   Multiple uncles and aunts on her father's side have heart disease.       The patient normally is (ambulatory / bedbound): Ambulatory, though limited by back pain and lower extremity arthritis   FAMILY HISTORY:  Family History  Problem Relation Age of Onset  . Heart failure Father   . Diabetes Father   . Kidney failure Father      REVIEW OF SYSTEMS:  Constitutional: denies weight loss, fever, chills, or sweats  Eyes: denies any other vision changes, history of eye injury  ENT: denies sore throat, hearing problems  Respiratory: denies shortness of breath, wheezing  Cardiovascular: denies chest pain, palpitations  Gastrointestinal: abdominal pain, N/V, and diarrhea as per HPI Musculoskeletal: reports chronic back and lower extremity hip and knee pain  Skin: denies any other rashes or skin discolorations  Neurological: denies any other headache, dizziness, weakness  Psychiatric: denies any other depression, anxiety  All other review of systems were negative   VITAL SIGNS:  Temp:  [97.4 F (36.3 C)-98.1 F (36.7 C)] 97.4 F  (36.3 C) (05/30 0543) Pulse Rate:  [52-74] 63 (05/30 0543) Resp:  [12-26] 18 (05/30 0543) BP: (109-173)/(51-142) 148/96 mmHg (05/30 0543) SpO2:  [92 %-100 %] 98 % (05/30 0543) Weight:  [100.018 kg (220 lb 8 oz)-101.152 kg (223 lb)] 100.018 kg (220 lb 8 oz) (05/29 2120)     Height: 5\' 4"  (162.6 cm) Weight: 100.018 kg (220 lb 8 oz) BMI (Calculated): 37.9   INTAKE/OUTPUT:  This shift:    Last 2 shifts: @IOLAST2SHIFTS @   PHYSICAL EXAM:  Constitutional:  -- Obese  -- Awake, alert, and oriented x3  Eyes:  -- Pupils equally round and reactive to light  -- No scleral icterus  Ear, nose, and throat:  -- No jugular venous  distension  Pulmonary:  -- No crackles  -- Equal breath sounds bilaterally  Cardiovascular:  -- S1, S2 present  -- No pericardial rubs Abdomen:  -- Soft and obese with RUQ > epigastric abdominal pain, no guarding/rebound, well-healed lower midline incision -- No abdominal masses appreciated, pulsatile or otherwise  Musculoskeletal / Integumentary:  -- Wounds or skin discoloration: RLQ ecchymosis consistent with reported heparin   -- Extremities: B/L UE and LE FROM, hands and feet warm Neurologic:  -- Motor function: intact and symmetric   Labs:  CBC:  Lab Results  Component Value Date   WBC 10.9* 03/11/2016   WBC 20.1* 07/16/2015   RBC 4.78 03/11/2016   RBC 5.16 07/16/2015   HEMOGLOBIN 15.4 07/16/2015   BMP:  Lab Results  Component Value Date   GLUCOSE 97 03/11/2016   CO2 28 03/11/2016   BUN 7 03/11/2016   CREATININE 0.59 03/11/2016   CALCIUM 8.7* 03/11/2016     Imaging studies:  HIDA Hepatobiliary Scan (03/11/2016) Patient fasted for greater than 48 hours therefore cholecystokinin was administered prior to the initiation of study. Prompt clearance radiotracer from the blood pool and homogeneous uptake within liver parenchyma. Counts are evident within the small bowel by 20 minutes. No filling of the gallbladder present at 60 minutes therefore  IV morphine was administered. No filling of the gallbladder following morphine administration. Counts are still evident within the common bile duct at termination of study.  Assessment/Plan:  64 y.o. female with likely acute cholecystitis, complicated by pertinent comorbidities including obesity, COPD, hypertension, and multiple pain disorders including chronic back pain.   - though acute cholecystitis likely doesn't explain all of her complaints, the combination of her clinical history and study results suggest acute cholecystitis  - continue NPO, IV fluids, antibiotics   - plan for laparoscopic cholecystectomy tomorrow   - DVT prophylaxis  All of the above findings and recommendations were discussed with the patient and her RN, and all of her questions were answered to her expressed satisfaction.  Thank you for the opportunity to participate in the care for this patient.   -- Scherrie Gerlach Earlene Plater, MD [place]: [facility] Office: [phone]

## 2016-03-11 NOTE — Care Management Obs Status (Signed)
MEDICARE OBSERVATION STATUS NOTIFICATION   Patient Details  Name: Carla [name] MRN: [id number] Date of Birth: [birth year]   Medicare Observation Status Notification Given:  Yes    Adonis Huguenin, RN 03/11/2016, 9:20 AM

## 2016-03-11 NOTE — Progress Notes (Signed)
Fine, red rash noted to abdomen and chest. Pt c/o itching . MD paged and 1 time order given for Benadryl 25mg  IV. Dilaudid d/c'd.

## 2016-03-12 ENCOUNTER — Inpatient Hospital Stay (HOSPITAL_COMMUNITY): Payer: Medicare Other | Admitting: Anesthesiology

## 2016-03-12 ENCOUNTER — Encounter (HOSPITAL_COMMUNITY): Payer: Self-pay | Admitting: *Deleted

## 2016-03-12 ENCOUNTER — Encounter (HOSPITAL_COMMUNITY)
Admission: EM | Disposition: A | Discharge status: Home / Self Care | Payer: Self-pay | Source: Home / Self Care | Attending: Internal Medicine

## 2016-03-12 DIAGNOSIS — K8000 Calculus of gallbladder with acute cholecystitis without obstruction: Secondary | ICD-10-CM

## 2016-03-12 DIAGNOSIS — K810 Acute cholecystitis: Secondary | ICD-10-CM

## 2016-03-12 DIAGNOSIS — R1110 Vomiting, unspecified: Secondary | ICD-10-CM

## 2016-03-12 DIAGNOSIS — J449 Chronic obstructive pulmonary disease, unspecified: Secondary | ICD-10-CM

## 2016-03-12 HISTORY — PX: CHOLECYSTECTOMY: SHX55

## 2016-03-12 LAB — COMPREHENSIVE METABOLIC PANEL
ALBUMIN: 3.2 g/dL — AB (ref 3.5–5.0)
ALT: 12 U/L — AB (ref 14–54)
AST: 15 U/L (ref 15–41)
Alkaline Phosphatase: 72 U/L (ref 38–126)
Anion gap: 8 (ref 5–15)
BILIRUBIN TOTAL: 0.5 mg/dL (ref 0.3–1.2)
CHLORIDE: 103 mmol/L (ref 101–111)
CO2: 29 mmol/L (ref 22–32)
Calcium: 8.5 mg/dL — ABNORMAL LOW (ref 8.9–10.3)
Creatinine, Ser: 0.5 mg/dL (ref 0.44–1.00)
GFR calc Af Amer: >60 mL/min (ref >60)
GFR calc non Af Amer: >60 mL/min (ref >60)
GLUCOSE: 108 mg/dL — AB (ref 65–99)
POTASSIUM: 3 mmol/L — AB (ref 3.5–5.1)
Sodium: 140 mmol/L (ref 135–145)
TOTAL PROTEIN: 6.4 g/dL — AB (ref 6.5–8.1)

## 2016-03-12 LAB — SURGICAL PCR SCREEN
MRSA, PCR: NEGATIVE
STAPHYLOCOCCUS AUREUS: POSITIVE — AB

## 2016-03-12 LAB — URINE CULTURE: Culture: NO GROWTH

## 2016-03-12 LAB — GLUCOSE, CAPILLARY
GLUCOSE-CAPILLARY: 126 mg/dL — AB (ref 65–99)
Glucose-Capillary: 200 mg/dL — ABNORMAL HIGH (ref 65–99)

## 2016-03-12 SURGERY — LAPAROSCOPIC CHOLECYSTECTOMY
Anesthesia: General | Site: Abdomen

## 2016-03-12 MED ORDER — SODIUM CHLORIDE 0.9 % IV SOLN
INTRAVENOUS | Status: DC
  Administered 2016-03-12: 22:00:00 via INTRAVENOUS
  Filled 2016-03-12 (×2): qty 1000

## 2016-03-12 MED ORDER — POVIDONE-IODINE 10 % OINT PACKET
TOPICAL_OINTMENT | CUTANEOUS | Status: DC | PRN
  Administered 2016-03-12: 1 via TOPICAL

## 2016-03-12 MED ORDER — GLYCOPYRROLATE 0.2 MG/ML IJ SOLN
0.2000 mg | Freq: Once | INTRAMUSCULAR | Status: AC
  Administered 2016-03-12: 0.2 mg via INTRAVENOUS

## 2016-03-12 MED ORDER — DEXAMETHASONE SODIUM PHOSPHATE 4 MG/ML IJ SOLN
4.0000 mg | Freq: Once | INTRAMUSCULAR | Status: AC
  Administered 2016-03-12: 4 mg via INTRAVENOUS

## 2016-03-12 MED ORDER — FENTANYL CITRATE (PF) 100 MCG/2ML IJ SOLN
INTRAMUSCULAR | Status: DC | PRN
  Administered 2016-03-12 (×3): 25 ug via INTRAVENOUS
  Administered 2016-03-12 (×2): 50 ug via INTRAVENOUS
  Administered 2016-03-12: 25 ug via INTRAVENOUS
  Administered 2016-03-12 (×5): 50 ug via INTRAVENOUS

## 2016-03-12 MED ORDER — SODIUM CHLORIDE 0.9 % IR SOLN
Status: DC | PRN
  Administered 2016-03-12: 1000 mL

## 2016-03-12 MED ORDER — BUPIVACAINE HCL (PF) 0.5 % IJ SOLN
INTRAMUSCULAR | Status: AC
  Filled 2016-03-12: qty 30

## 2016-03-12 MED ORDER — MIDAZOLAM HCL 2 MG/2ML IJ SOLN
INTRAMUSCULAR | Status: AC
  Filled 2016-03-12: qty 2

## 2016-03-12 MED ORDER — HEMOSTATIC AGENTS (NO CHARGE) OPTIME
TOPICAL | Status: DC | PRN
  Administered 2016-03-12: 1 via TOPICAL

## 2016-03-12 MED ORDER — POTASSIUM CHLORIDE 10 MEQ/100ML IV SOLN
10.0000 meq | INTRAVENOUS | Status: AC
  Administered 2016-03-12: 10 meq via INTRAVENOUS
  Filled 2016-03-12: qty 100

## 2016-03-12 MED ORDER — ROCURONIUM BROMIDE 100 MG/10ML IV SOLN
INTRAVENOUS | Status: DC | PRN
  Administered 2016-03-12: 5 mg via INTRAVENOUS
  Administered 2016-03-12: 10 mg via INTRAVENOUS
  Administered 2016-03-12: 25 mg via INTRAVENOUS

## 2016-03-12 MED ORDER — SUCCINYLCHOLINE CHLORIDE 20 MG/ML IJ SOLN
INTRAMUSCULAR | Status: DC | PRN
  Administered 2016-03-12: 120 mg via INTRAVENOUS

## 2016-03-12 MED ORDER — GLYCOPYRROLATE 0.2 MG/ML IJ SOLN
INTRAMUSCULAR | Status: AC
  Filled 2016-03-12: qty 1

## 2016-03-12 MED ORDER — FENTANYL CITRATE (PF) 100 MCG/2ML IJ SOLN
INTRAMUSCULAR | Status: AC
  Filled 2016-03-12: qty 2

## 2016-03-12 MED ORDER — LIDOCAINE HCL (CARDIAC) 10 MG/ML IV SOLN
INTRAVENOUS | Status: DC | PRN
  Administered 2016-03-12: 50 mg via INTRAVENOUS

## 2016-03-12 MED ORDER — LACTATED RINGERS IV SOLN
INTRAVENOUS | Status: DC
  Administered 2016-03-12: 14:00:00 via INTRAVENOUS
  Administered 2016-03-12: 1000 mL via INTRAVENOUS

## 2016-03-12 MED ORDER — HYDRALAZINE HCL 20 MG/ML IJ SOLN
INTRAMUSCULAR | Status: DC | PRN
  Administered 2016-03-12: 2 mg via INTRAVENOUS
  Administered 2016-03-12: 4 mg via INTRAVENOUS
  Administered 2016-03-12: 2 mg via INTRAVENOUS

## 2016-03-12 MED ORDER — PROPOFOL 10 MG/ML IV BOLUS
INTRAVENOUS | Status: DC | PRN
  Administered 2016-03-12: 150 mg via INTRAVENOUS
  Administered 2016-03-12: 20 mg via INTRAVENOUS

## 2016-03-12 MED ORDER — POVIDONE-IODINE 10 % EX OINT
TOPICAL_OINTMENT | CUTANEOUS | Status: AC
  Filled 2016-03-12: qty 1

## 2016-03-12 MED ORDER — LIDOCAINE HCL 1 % IJ SOLN
INTRAMUSCULAR | Status: DC | PRN
  Administered 2016-03-12: 10 mL

## 2016-03-12 MED ORDER — SODIUM CHLORIDE 0.9 % IJ SOLN
INTRAMUSCULAR | Status: AC
  Filled 2016-03-12: qty 10

## 2016-03-12 MED ORDER — HYDRALAZINE HCL 20 MG/ML IJ SOLN
INTRAMUSCULAR | Status: AC
  Filled 2016-03-12: qty 1

## 2016-03-12 MED ORDER — MIDAZOLAM HCL 2 MG/2ML IJ SOLN
1.0000 mg | INTRAMUSCULAR | Status: DC | PRN
  Administered 2016-03-12 (×2): 2 mg via INTRAVENOUS

## 2016-03-12 MED ORDER — ONDANSETRON HCL 4 MG/2ML IJ SOLN
4.0000 mg | Freq: Once | INTRAMUSCULAR | Status: AC
  Administered 2016-03-12: 4 mg via INTRAVENOUS

## 2016-03-12 MED ORDER — GLYCOPYRROLATE 0.2 MG/ML IJ SOLN
INTRAMUSCULAR | Status: DC | PRN
  Administered 2016-03-12: 0.6 mg via INTRAVENOUS

## 2016-03-12 MED ORDER — GLYCOPYRROLATE 0.2 MG/ML IJ SOLN
INTRAMUSCULAR | Status: AC
  Filled 2016-03-12: qty 3

## 2016-03-12 MED ORDER — MUPIROCIN 2 % EX OINT
TOPICAL_OINTMENT | CUTANEOUS | Status: AC
  Filled 2016-03-12: qty 22

## 2016-03-12 MED ORDER — LIDOCAINE HCL (PF) 1 % IJ SOLN
INTRAMUSCULAR | Status: AC
  Filled 2016-03-12: qty 30

## 2016-03-12 MED ORDER — NEOSTIGMINE METHYLSULFATE 10 MG/10ML IV SOLN
INTRAVENOUS | Status: AC
  Filled 2016-03-12: qty 1

## 2016-03-12 MED ORDER — METOPROLOL TARTRATE 5 MG/5ML IV SOLN
INTRAVENOUS | Status: AC
  Filled 2016-03-12: qty 5

## 2016-03-12 MED ORDER — METOPROLOL TARTRATE 5 MG/5ML IV SOLN
INTRAVENOUS | Status: DC | PRN
  Administered 2016-03-12: 1 mg via INTRAVENOUS
  Administered 2016-03-12: 2 mg via INTRAVENOUS
  Administered 2016-03-12: 1 mg via INTRAVENOUS

## 2016-03-12 MED ORDER — MIDAZOLAM HCL 2 MG/2ML IJ SOLN
INTRAMUSCULAR | Status: DC | PRN
  Administered 2016-03-12: 2 mg via INTRAVENOUS

## 2016-03-12 MED ORDER — NEOSTIGMINE METHYLSULFATE 10 MG/10ML IV SOLN
INTRAVENOUS | Status: DC | PRN
  Administered 2016-03-12: 3 mg via INTRAVENOUS

## 2016-03-12 MED ORDER — ROCURONIUM BROMIDE 100 MG/10ML IV SOLN: INTRAVENOUS | Status: DC | PRN

## 2016-03-12 MED ORDER — MUPIROCIN 2 % EX OINT
1.0000 "application " | TOPICAL_OINTMENT | Freq: Two times a day (BID) | CUTANEOUS | Status: DC
  Administered 2016-03-12: 1 via TOPICAL

## 2016-03-12 MED ORDER — POTASSIUM CHLORIDE 10 MEQ/100ML IV SOLN
10.0000 meq | INTRAVENOUS | Status: AC
  Administered 2016-03-12 (×3): 10 meq via INTRAVENOUS
  Filled 2016-03-12: qty 100

## 2016-03-12 MED ORDER — FENTANYL CITRATE (PF) 100 MCG/2ML IJ SOLN
25.0000 ug | INTRAMUSCULAR | Status: DC | PRN
  Administered 2016-03-12 (×2): 50 ug via INTRAVENOUS
  Filled 2016-03-12: qty 2

## 2016-03-12 MED ORDER — DEXAMETHASONE SODIUM PHOSPHATE 4 MG/ML IJ SOLN
INTRAMUSCULAR | Status: AC
  Filled 2016-03-12: qty 1

## 2016-03-12 MED ORDER — ONDANSETRON HCL 4 MG/2ML IJ SOLN
4.0000 mg | Freq: Once | INTRAMUSCULAR | Status: AC | PRN
  Administered 2016-03-12: 4 mg via INTRAVENOUS
  Filled 2016-03-12: qty 2

## 2016-03-12 MED ORDER — ONDANSETRON HCL 4 MG/2ML IJ SOLN
INTRAMUSCULAR | Status: AC
  Filled 2016-03-12: qty 2

## 2016-03-12 SURGICAL SUPPLY — 52 items
APPLICATOR ARISTA FLEXITIP XL (MISCELLANEOUS) ×3 IMPLANT
APPLIER CLIP LAPSCP 10X32 DD (CLIP) ×3 IMPLANT
BAG HAMPER (MISCELLANEOUS) ×3 IMPLANT
CHLORAPREP W/TINT 26ML (MISCELLANEOUS) ×3 IMPLANT
CLOTH BEACON ORANGE TIMEOUT ST (SAFETY) ×3 IMPLANT
COVER LIGHT HANDLE STERIS (MISCELLANEOUS) ×6 IMPLANT
DECANTER SPIKE VIAL GLASS SM (MISCELLANEOUS) ×3 IMPLANT
DERMABOND ADVANCED (GAUZE/BANDAGES/DRESSINGS) ×2
DERMABOND ADVANCED .7 DNX12 (GAUZE/BANDAGES/DRESSINGS) ×1 IMPLANT
DEVICE TROCAR PUNCTURE CLOSURE (ENDOMECHANICALS) ×3 IMPLANT
ELECT REM PT RETURN 9FT ADLT (ELECTROSURGICAL) ×3
ELECTRODE REM PT RTRN 9FT ADLT (ELECTROSURGICAL) ×1 IMPLANT
FILTER SMOKE EVAC LAPAROSHD (FILTER) ×3 IMPLANT
FORMALIN 10 PREFIL 120ML (MISCELLANEOUS) ×3 IMPLANT
GLOVE BIOGEL PI IND STRL 7.0 (GLOVE) ×1 IMPLANT
GLOVE BIOGEL PI IND STRL 7.5 (GLOVE) ×1 IMPLANT
GLOVE BIOGEL PI INDICATOR 7.0 (GLOVE) ×2
GLOVE BIOGEL PI INDICATOR 7.5 (GLOVE) ×2
GLOVE ECLIPSE 6.5 STRL STRAW (GLOVE) ×3 IMPLANT
GLOVE ECLIPSE 7.0 STRL STRAW (GLOVE) ×3 IMPLANT
GLOVE EXAM NITRILE MD LF STRL (GLOVE) ×3 IMPLANT
GLOVE SURG SS PI 7.5 STRL IVOR (GLOVE) ×3 IMPLANT
GOWN STRL REUS W/ TWL XL LVL3 (GOWN DISPOSABLE) ×1 IMPLANT
GOWN STRL REUS W/TWL LRG LVL3 (GOWN DISPOSABLE) ×6 IMPLANT
GOWN STRL REUS W/TWL XL LVL3 (GOWN DISPOSABLE) ×2
HEMOSTAT ARISTA ABSORB 3G PWDR (MISCELLANEOUS) ×3 IMPLANT
HEMOSTAT SNOW SURGICEL 2X4 (HEMOSTASIS) ×3 IMPLANT
INST SET LAPROSCOPIC AP (KITS) ×3 IMPLANT
IV NS IRRIG 3000ML ARTHROMATIC (IV SOLUTION) IMPLANT
KIT ROOM TURNOVER APOR (KITS) ×3 IMPLANT
MANIFOLD NEPTUNE II (INSTRUMENTS) ×3 IMPLANT
NEEDLE INSUFFLATION 14GA 120MM (NEEDLE) ×3 IMPLANT
NS IRRIG 1000ML POUR BTL (IV SOLUTION) ×3 IMPLANT
PACK LAP CHOLE LZT030E (CUSTOM PROCEDURE TRAY) ×3 IMPLANT
PAD ARMBOARD 7.5X6 YLW CONV (MISCELLANEOUS) ×3 IMPLANT
POUCH SPECIMEN RETRIEVAL 10MM (ENDOMECHANICALS) ×3 IMPLANT
SET BASIN LINEN APH (SET/KITS/TRAYS/PACK) ×3 IMPLANT
SET TUBE IRRIG SUCTION NO TIP (IRRIGATION / IRRIGATOR) IMPLANT
SLEEVE ENDOPATH XCEL 5M (ENDOMECHANICALS) ×6 IMPLANT
SPONGE GAUZE 2X2 8PLY STER LF (GAUZE/BANDAGES/DRESSINGS) ×4
SPONGE GAUZE 2X2 8PLY STRL LF (GAUZE/BANDAGES/DRESSINGS) ×8 IMPLANT
STAPLER VISISTAT (STAPLE) ×3 IMPLANT
SUT MNCRL AB 4-0 PS2 18 (SUTURE) ×3 IMPLANT
SUT VICRYL 0 UR6 27IN ABS (SUTURE) ×6 IMPLANT
SUT VICRYL AB 3-0 FS1 BRD 27IN (SUTURE) ×3 IMPLANT
TAPE CLOTH SURG 4X10 WHT LF (GAUZE/BANDAGES/DRESSINGS) ×3 IMPLANT
TROCAR ENDO BLADELESS 11MM (ENDOMECHANICALS) ×3 IMPLANT
TROCAR XCEL NON-BLD 5MMX100MML (ENDOMECHANICALS) ×3 IMPLANT
TROCAR XCEL UNIV SLVE 11M 100M (ENDOMECHANICALS) ×3 IMPLANT
TUBING INSUFFLATION (TUBING) ×3 IMPLANT
WARMER LAPAROSCOPE (MISCELLANEOUS) ×3 IMPLANT
YANKAUER SUCT 12FT TUBE ARGYLE (SUCTIONS) ×3 IMPLANT

## 2016-03-12 NOTE — Progress Notes (Signed)
Awake. Needs to void. Placed on bedpan. Unable to void.

## 2016-03-12 NOTE — Anesthesia Preprocedure Evaluation (Signed)
Anesthesia Evaluation  Patient identified by MRN, date of birth, ID band Patient awake    Reviewed: Allergy & Precautions, NPO status , Patient's Chart, lab work & pertinent test results, reviewed documented beta blocker date and time   Airway Mallampati: II  TM Distance: >3 FB     Dental  (+) Poor Dentition, Missing, Chipped, Dental Advisory Given,    Pulmonary COPD, former smoker,    Pulmonary exam normal        Cardiovascular hypertension, Pt. on home beta blockers and Pt. on medications  Rhythm:Regular Rate:Normal     Neuro/Psych PSYCHIATRIC DISORDERS Anxiety Depression    GI/Hepatic GERD  Controlled and Medicated,  Endo/Other  diabetes, Type 2, Oral Hypoglycemic AgentsMorbid obesity  Renal/GU      Musculoskeletal   Abdominal   Peds  Hematology   Anesthesia Other Findings   Reproductive/Obstetrics                             Anesthesia Physical Anesthesia Plan  ASA: III  Anesthesia Plan: General   Post-op Pain Management:    Induction: Intravenous, Rapid sequence and Cricoid pressure planned  Airway Management Planned: Oral ETT  Additional Equipment:   Intra-op Plan:   Post-operative Plan: Extubation in OR  Informed Consent: I have reviewed the patients History and Physical, chart, labs and discussed the procedure including the risks, benefits and alternatives for the proposed anesthesia with the patient or authorized representative who has indicated his/her understanding and acceptance.     Plan Discussed with:   Anesthesia Plan Comments:         Anesthesia Quick Evaluation

## 2016-03-12 NOTE — Progress Notes (Signed)
In r4oom. Ambulated to restroom. Voided without difficulty. Tolerated well.

## 2016-03-12 NOTE — Anesthesia Postprocedure Evaluation (Signed)
Anesthesia Post Note  Patient: Carla [name]  Procedure(s) Performed: Procedure(s) (LRB): LAPAROSCOPIC CHOLECYSTECTOMY (N/A)  Patient location during evaluation: PACU Anesthesia Type: General Level of consciousness: awake and alert Pain management: satisfactory to patient Vital Signs Assessment: post-procedure vital signs reviewed and stable Respiratory status: spontaneous breathing and patient connected to nasal cannula oxygen Cardiovascular status: stable Anesthetic complications: no    Last Vitals:  Filed Vitals:   03/12/16 1500 03/12/16 1515  BP: 177/76 172/76  Pulse: 67 66  Temp:    Resp: 29 26    Last Pain:  Filed Vitals:   03/12/16 1524  PainSc: Asleep                 Agustine Rossitto

## 2016-03-12 NOTE — Progress Notes (Signed)
PROGRESS NOTE    Carla [name]  AFB:[id number] DOB: 1951-07-05 DOA: 03/10/2016 PCP: [name], MD  Brief Narrative:  69 yof with medical history of prediabetes, HTNM COPD, presented with complaints of abdominal pain. She also complains of associated diarrhea, poor oral intake, nausea and vomiting. She reported falling out on the kitchen floor and daughter called EMS. No reports of LOC or trauma from fall. Of note patient was discharged on 03/10/2016 for abdominal pain with oral pain management. Patient reported she did not fill her given outpatient prescription. General surgery following.   Assessment & Plan:   Active Problems:   COPD (chronic obstructive pulmonary disease) (HCC)   Cholecystitis, acute   Obesity, morbid (HCC)   Cholelithiasis   Syncope and collapse   Intractable nausea and vomiting   Abdominal pain  1. Acute cholecystitis, noted on CT abdomen. HIDA scan revealed Non filling of the gallbladder is most consistent with cystic duct obstruction associated with acute cholecystitis. General surgery following and performed laparoscopic cholecystectomy today.  2. Epigastric abdominal pain with associated intractable nausea and vomiting. Improving. Continue supportive treatment. Start on clear liquids as tolerated. 3. Essential HTN. Stable. Continue home medications.  4. COPD. Stable.  5. Hypokalemia. Replete   DVT prophylaxis: Heparin  Code Status: Full Family Communication: discussed with patient and daughter at the bedside Disposition Plan: discharge home when improved, hopefully in AM.   Consultants:   General Surgery   Procedures:   5/31: laparoscopic cholecystectomy  Antimicrobials:   Flagyl 5/30>>  Cipro 5/30>>   Subjective: Patient is seen in room post operatively. She is complaining of abdominal pain.  Objective: Filed Vitals:   03/11/16 0543 03/11/16 1515 03/11/16 2255 03/12/16 0627  BP: 148/96 166/78 126/76 144/64  Pulse: 63 57 63 50    Temp: 97.4 F (36.3 C) 97.9 F (36.6 C) 97.7 F (36.5 C) 97.6 F (36.4 C)  TempSrc: Oral Oral Oral Oral  Resp: 18 16 16 16   Height:      Weight:      SpO2: 98% 96% 97% 93%    Intake/Output Summary (Last 24 hours) at 03/12/16 0816 Last data filed at 03/11/16 1835  Gross per 24 hour  Intake      0 ml  Output      0 ml  Net      0 ml   Filed Weights   03/10/16 1259 03/10/16 2120  Weight: 101.152 kg (223 lb) 100.018 kg (220 lb 8 oz)    Examination:  General exam: Appears calm and comfortable  Respiratory system: Clear to auscultation. Respiratory effort normal. Cardiovascular system: S1 & S2 heard, RRR. No JVD, murmurs, rubs, gallops or clicks. No pedal edema. Gastrointestinal system: Abdomen is nondistended, diffusely tender No organomegaly or masses felt. Normal bowel sounds heard. Central nervous system: Alert and oriented. No focal neurological deficits. Extremities: Symmetric 5 x 5 power. Skin: No rashes, lesions or ulcers Psychiatry: Judgement and insight appear normal. Mood & affect appropriate.     Data Reviewed: I have personally reviewed following labs and imaging studies  CBC:  Recent Labs Lab 03/07/16 0634 03/08/16 0637 03/09/16 0656 03/10/16 1338 03/11/16 0559  WBC 18.2* 13.8* 11.8* 11.1* 10.9*  NEUTROABS 12.2*  --   --  6.8  --   HGB 15.6* 16.1* 14.5 14.5 14.2  HCT 47.3* 50.5* 46.1* 45.1 44.0  MCV 91.1 93.0 92.4 91.9 92.1  PLT 284 237 260 266 268   Basic Metabolic Panel:  Recent Labs Lab 03/08/16  4098 03/09/16 0656 03/10/16 1338 03/11/16 0559 03/12/16 0555  NA 137 136 140 140 140  K 4.1 3.8 4.0 3.4* 3.0*  CL 99* 102 104 103 103  CO2 27 27 25 28 29   GLUCOSE 110* 102* 111* 97 108*  BUN 9 9 7 7  <5*  CREATININE 0.53 0.53 0.55 0.59 0.50  CALCIUM 9.1 8.4* 9.5 8.7* 8.5*   GFR: Estimated Creatinine Clearance: 83.8 mL/min (by C-G formula based on Cr of 0.5). Liver Function Tests:  Recent Labs Lab 03/07/16 0634 03/08/16 0637  03/10/16 1338 03/12/16 0555  AST 17 21 15 15   ALT 13* 12* 11* 12*  ALKPHOS 95 94 86 72  BILITOT 0.4 0.9 0.5 0.5  PROT 7.4 7.0 7.3 6.4*  ALBUMIN 4.1 3.6 3.6 3.2*    Recent Labs Lab 03/07/16 0634 03/10/16 1338  LIPASE 12 11   No results for input(s): AMMONIA in the last 168 hours. Coagulation Profile: No results for input(s): INR, PROTIME in the last 168 hours. Cardiac Enzymes:  Recent Labs Lab 03/07/16 0634 03/10/16 1338  TROPONINI <0.03 <0.03   BNP (last 3 results) No results for input(s): PROBNP in the last 8760 hours. HbA1C: No results for input(s): HGBA1C in the last 72 hours. CBG:  Recent Labs Lab 03/08/16 1626 03/08/16 2058 03/09/16 0750 03/09/16 1151 03/09/16 1537  GLUCAP 77 128* 103* 86 130*   Lipid Profile: No results for input(s): CHOL, HDL, LDLCALC, TRIG, CHOLHDL, LDLDIRECT in the last 72 hours. Thyroid Function Tests:  Recent Labs  03/10/16 1600  TSH 3.083   Anemia Panel: No results for input(s): VITAMINB12, FOLATE, FERRITIN, TIBC, IRON, RETICCTPCT in the last 72 hours. Urine analysis:    Component Value Date/Time   COLORURINE YELLOW 03/10/2016 1450   APPEARANCEUR CLEAR 03/10/2016 1450   LABSPEC 1.015 03/10/2016 1450   PHURINE 8.5* 03/10/2016 1450   GLUCOSEU NEGATIVE 03/10/2016 1450   HGBUR NEGATIVE 03/10/2016 1450   BILIRUBINUR NEGATIVE 03/10/2016 1450   KETONESUR NEGATIVE 03/10/2016 1450   PROTEINUR NEGATIVE 03/10/2016 1450   UROBILINOGEN 0.2 06/28/2015 1940   NITRITE NEGATIVE 03/10/2016 1450   LEUKOCYTESUR NEGATIVE 03/10/2016 1450   Sepsis Labs: @LABRCNTIP (procalcitonin:4,lacticidven:4)  ) Recent Results (from the past 240 hour(s))  Urine culture     Status: Abnormal   Collection Time: 03/07/16 11:00 AM  Result Value Ref Range Status   Specimen Description URINE, CLEAN CATCH  Final   Special Requests Normal  Final   Culture MULTIPLE SPECIES PRESENT, SUGGEST RECOLLECTION (A)  Final   Report Status 03/09/2016 FINAL  Final          Radiology Studies: Dg Chest 2 View  03/10/2016  CLINICAL DATA:  Nausea and vomiting.  Abdominal pain EXAM: CHEST  2 VIEW COMPARISON:  03/07/2016 FINDINGS: The heart size and mediastinal contours are within normal limits. Both lungs are clear. The visualized skeletal structures are unremarkable. IMPRESSION: No active cardiopulmonary disease. Electronically Signed   By: Marlan Palau M.D.   On: 03/10/2016 17:41   Ct Abdomen Pelvis W Contrast  03/10/2016  CLINICAL DATA:  Acute upper abdominal pain. EXAM: CT ABDOMEN AND PELVIS WITH CONTRAST TECHNIQUE: Multidetector CT imaging of the abdomen and pelvis was performed using the standard protocol following bolus administration of intravenous contrast. CONTRAST:  ISOVUE-300 IOPAMIDOL (ISOVUE-300) INJECTION 61% COMPARISON:  Ultrasound of Mar 07, 2016. FINDINGS: Visualized lung bases are unremarkable. No significant osseous abnormality is noted. Fatty infiltration of the liver is noted with sparing around the gallbladder fossa. The spleen and pancreas  appear normal. Large calculus is noted in gallbladder neck with mild gallbladder wall thickening concerning for acute cholecystitis. Adrenal glands and kidneys appear normal. No hydronephrosis or renal obstruction is noted. Atherosclerosis of abdominal aorta is noted without aneurysm formation. There is no evidence of bowel obstruction. No abnormal fluid collection is noted. Urinary bladder appears normal. Uterus and ovaries appear normal. No significant adenopathy is noted. IMPRESSION: Fatty infiltration of the liver is noted. Large solitary gallstone noted in neck of gallbladder. Mild gallbladder wall thickening is noted concerning for acute cholecystitis. HIDA scan may be performed for further evaluation. Electronically Signed   By: Lupita Raider, M.D.   On: 03/10/2016 17:19   Nm Hepato W/eject Fract  03/11/2016  CLINICAL DATA:  Abdominal pain nausea and vomiting. Nausea vomiting since 03/07/2016.  Concern for acute cholecystitis on CT ultrasound imaging. EXAM: NUCLEAR MEDICINE HEPATOBILIARY IMAGING TECHNIQUE: Patient facet 48 hours therefore cholecystokinin (2 mcg) was administered prior to initiation of the radiotracer injection to contract gallbladder. Sequential images of the abdomen were obtained out to 60 minutes following intravenous administration of radiopharmaceutical. Gallbladder was not evident 60 minutes therefore 3 mg IV morphine was administered to contracted sphincter very RADIOPHARMACEUTICALS:  4.8 mCi Tc-4m  Choletec IV COMPARISON:  CT 03/10/2016, ultrasound 526 17 FINDINGS: Patient fasted for greater than 48 hours therefore cholecystokinin was administered prior to the initiation of study. Prompt clearance radiotracer from the blood pool and homogeneous uptake within liver parenchyma. Counts are evident within the small bowel by 20 minutes. No filling of the gallbladder present at 60 minutes therefore IV morphine was administered. No filling of the gallbladder following morphine administration. Counts are still evident within the common bile duct at termination of study. IMPRESSION: Non filling of the gallbladder is most consistent with cystic duct obstruction associated with acute cholecystitis. These results will be called to the ordering clinician or representative by the Radiologist Assistant, and communication documented in the PACS or zVision Dashboard. Electronically Signed   By: Genevive Bi M.D.   On: 03/11/2016 14:28        Scheduled Meds: . amLODipine  5 mg Oral Daily  . aspirin EC  81 mg Oral Daily  . ciprofloxacin  400 mg Intravenous Q12H  . DULoxetine  60 mg Oral QHS  . gabapentin  300 mg Oral TID  . heparin  5,000 Units Subcutaneous Q8H  . metoprolol succinate  50 mg Oral QHS  . metronidazole  500 mg Intravenous Q8H  . pantoprazole  40 mg Oral Daily   Continuous Infusions: . dextrose 5 % and 0.9% NaCl 100 mL/hr at 03/11/16 1605     LOS: 1 day     Time spent:    Erick Blinks, MD  [facility] Pager [phone]  If 7PM-7AM, please contact night-coverage [email or web address] Password [email or web address] 03/12/2016, 8:16 AM

## 2016-03-12 NOTE — Anesthesia Procedure Notes (Signed)
Procedure Name: Intubation Date/Time: 03/12/2016 12:53 PM Performed by: Franco Nones Pre-anesthesia Checklist: Patient identified, Patient being monitored, Timeout performed, Emergency Drugs available and Suction available Patient Re-evaluated:Patient Re-evaluated prior to inductionOxygen Delivery Method: Circle system utilized Preoxygenation: Pre-oxygenation with 100% oxygen Intubation Type: IV induction, Cricoid Pressure applied and Rapid sequence Laryngoscope Size: Mac and 3 Grade View: Grade I Tube type: Oral Tube size: 7.0 mm Number of attempts: 1 Airway Equipment and Method: Stylet Placement Confirmation: ETT inserted through vocal cords under direct vision,  positive ETCO2 and breath sounds checked- equal and bilateral Secured at: 22 cm Tube secured with: Tape Dental Injury: Teeth and Oropharynx as per pre-operative assessment

## 2016-03-12 NOTE — Progress Notes (Signed)
SURGICAL POST-OPERATIVE NOTE   Patient seen and examined with no acute post-operative events, reports RUQ and Right shoulder pain, though describes it as tolerable, denies CP, SOB, fever/chills.  Review of Systems:  Constitutional: denies fever, chills  HEENT: denies cough or congestion  Respiratory: denies any shortness of breath  Cardiovascular: denies chest pain or palpitations  Gastrointestinal: pain as per HPI, denies N/V, diarrhea or constipation  Musculoskeletal: denies pain, decreased motor or sensation  Neurological: denies HA or vision/hearing changes   Vital signs  Filed Vitals:   03/12/16 1538 03/12/16 1545 03/12/16 1555 03/12/16 1657  BP:  143/74 140/70 139/73  Pulse: 65 67 68 63  Temp:    97.7 F (36.5 C)  TempSrc:    Oral  Resp: 15 18 18 18   Height:      Weight:      SpO2: 97% 98% 98% 95%       Height: 5\' 4"  (162.6 cm)  Weight: 99.791 kg (220 lb) BMI (Calculated): 37.8   Intake/Output:  Total I/O In: 1200 [I.V.:1200] Out: 315 [Urine:300; Blood:15]   Physical Exam:  General: Alert, cooperative and no distress  Neuro: CNII - XII grossly intact and symmetric, no motor or sensory deficits  HENT: Normocephalic, without obvious abnormality  Eyes: PERRL, EOM's grossly intact and symmetric  Lungs: Breathing non-labored at rest  Cardiovascular: Regular rate and sinus rhythm  Gastrointestinal: Abdomen soft and obese with RUQ tenderness and little epigastric tenderness, incisional dressings c/d/i  Extremities: UE and LE FROM, NT, no wounds    Labs:  CBC:  Lab Results  Component Value Date   WBC 10.9* 03/11/2016   WBC 20.1* 07/16/2015   RBC 4.78 03/11/2016   RBC 5.16 07/16/2015   HEMOGLOBIN 15.4 07/16/2015   BMP:  Lab Results  Component Value Date   GLUCOSE 108* 03/12/2016   CO2 29 03/12/2016   BUN <5* 03/12/2016   CREATININE 0.50 03/12/2016   CALCIUM 8.5* 03/12/2016     Imaging/Diagnostic studies: No new pertinent imaging  studies  Assessment/Plan:  65 y.o. female POD #0 s/p laparoscopic cholecystectomy for acute on chronic cholecystitis with intraoperative findings of severe inflammation and gallbladder hydrops, complicated by pertinent comorbidities including obesity, COPD, hypertension, and multiple pain disorders including chronic back pain.   - pain control as needed   - out of bed and ambulation encouraged   - okay to advance diet as tolerated   - discharge planning when pain controlled, tolerating diet, and ambulating  - patient will require post-operative follow-up at surgery office in 2 weeks   All of the above findings and recommendations were discussed with the patient, and all of her questions were answered to her expressed satisfaction. Patient expressed particular gratitude to learn confirmation of her diagnosis.  -- Scherrie Gerlach Earlene Plater, MD [place]: [facility] General [facility] V[facility] Office: [phone]

## 2016-03-12 NOTE — Op Note (Signed)
SURGICAL OPERATIVE REPORT   DATE OF PROCEDURE: 03/12/2016   ATTENDING SURGEON: Ancil Linsey, MD   ASSISTANT(S): Franky Macho, MD  ANESTHESIA: GETA  PRE-OPERATIVE DIAGNOSIS: Acute Cholecystitis  POST-OPERATIVE DIAGNOSIS: Acute on Chronic Cholecystitis with gallbladder hydrops  PROCEDURE(S):  1.) Laparoscopic Cholecystectomy  INTRAOPERATIVE FINDINGS: Gallbladder hydrops, severe cholecystic inflammation  INTRAOPERATIVE FLUIDS: 1300 mL crystalloid   ESTIMATED BLOOD LOSS: Minimal (<30 mL)   URINE OUTPUT: No foley  SPECIMENS: Gallbladder  IMPLANTS: None  DRAINS: None   COMPLICATIONS: None apparent   CONDITION AT COMPLETION: Hemodynamically stable and extubated  DISPOSITION: PACU   INDICATION(S) FOR PROCEDURE:  Patient is a 65 y.o. female who previously and again this admission presented with severe abdominal pain with imaging suggestive of acute cholecystitis. All risks, benefits, and alternatives to above elective procedures were discussed with the patient, who elected to proceed, and informed consent was accordingly obtained at that time.   DETAILS OF PROCEDURE:  Patient was brought to the operating suite and appropriately identified. General anesthesia was administered along with peri-operative prophylactic IV antibiotics, and endotracheal intubation was performed by anesthesiologist, along with NG/OG tube for gastric decompression. In supine position, operative site was prepped and draped in usual sterile fashion, and following a brief time out, initial 5 mm incision was made in a natural skin crease just above the umbilicus. Fascia was then elevated, and a Verress needle was inserted and its proper position confirmed using aspiration and saline meniscus test.  Upon insufflation of the abdominal cavity with carbon dioxide to a well-tolerated pressure of 12-15 mmHg, 5 mm peri-umbilical port followed by laparoscope were inserted and used to inspect the abdominal cavity  and its contents with no injuries from insertion of the first trochar noted. Three additional trocars were inserted, one at the epigastric position (10 mm) and two along the Right costal margin (5 mm). The table was then placed in reverse Trendelenburg position with the Right side up. Filmy adhesions between the gallbladder and omentum/duodenum/transverse colon were lysed using combined blunt and sharp dissection. After decompressing the severely inflamed and thickened gallbladder, the apex/dome of the gallbladder was grasped with an atraumatic grasper passed through the lateral port and retracted apically over the liver. The infundibulum was also grasped and retracted, exposing Calot's triangle. The peritoneum overlying the gallbladder infundibulum was incised and dissected free of surrounding peritoneal attachments, revealing the cystic duct and cystic artery, which were clipped twice on the patient side and once on the gallbladder specimen side close to the gallbladder. The gallbladder was then dissected from its peritoneal attachments to the liver using electrocautery, and the gallbladder was placed into a laparoscopic specimen bag and removed from the abdominal cavity via the epigastric port site. Hemostasis and secure placement of clips were confirmed, and intra-peritoneal cavity was inspected with no additional findings. Endoclose laparoscopic fascial closure device was then used to re-approximate fascia at the 10 mm epigastric port site.  All ports were then removed under direct visualization, and abdominal cavity was desuflated. All port sites were irrigated/cleaned, additional local anesthetic was injected at each incision, and staples were used to re-approximate skin. Skin was then cleaned, dried, and sterile dressings were applied. Patient was then safely able to be awakened, extubated, and transferred to PACU for post-operative monitoring and care.   I was present for all aspects of procedure, and  there were no intra-operative complications apparent.

## 2016-03-12 NOTE — Transfer of Care (Signed)
Immediate Anesthesia Transfer of Care Note  Patient: Carla [name]  Procedure(s) Performed: Procedure(s): LAPAROSCOPIC CHOLECYSTECTOMY (N/A)  Patient Location: PACU  Anesthesia Type:General  Level of Consciousness: awake and patient cooperative  Airway & Oxygen Therapy: Patient Spontanous Breathing and non-rebreather face mask  Post-op Assessment: Report given to RN and Post -op Vital signs reviewed and stable  Post vital signs: Reviewed and stable    Last Pain:  Filed Vitals:   03/12/16 1239  PainSc: 0-No pain      Patients Stated Pain Goal: 8 (03/12/16 1123)  Complications: No apparent anesthesia complications

## 2016-03-13 ENCOUNTER — Encounter (HOSPITAL_COMMUNITY): Payer: Self-pay | Admitting: Surgery

## 2016-03-13 LAB — COMPREHENSIVE METABOLIC PANEL
ALK PHOS: 69 U/L (ref 38–126)
ALT: 19 U/L (ref 14–54)
AST: 39 U/L (ref 15–41)
Albumin: 3.2 g/dL — ABNORMAL LOW (ref 3.5–5.0)
Anion gap: 8 (ref 5–15)
CHLORIDE: 105 mmol/L (ref 101–111)
CO2: 25 mmol/L (ref 22–32)
Calcium: 8.6 mg/dL — ABNORMAL LOW (ref 8.9–10.3)
Creatinine, Ser: 0.53 mg/dL (ref 0.44–1.00)
GFR calc Af Amer: >60 mL/min (ref >60)
Glucose, Bld: 122 mg/dL — ABNORMAL HIGH (ref 65–99)
POTASSIUM: 3.7 mmol/L (ref 3.5–5.1)
Sodium: 138 mmol/L (ref 135–145)
Total Bilirubin: 0.6 mg/dL (ref 0.3–1.2)
Total Protein: 6.5 g/dL (ref 6.5–8.1)

## 2016-03-13 LAB — CBC
HEMATOCRIT: 44.4 % (ref 36.0–46.0)
Hemoglobin: 14.2 g/dL (ref 12.0–15.0)
MCH: 29.3 pg (ref 26.0–34.0)
MCHC: 32 g/dL (ref 30.0–36.0)
MCV: 91.5 fL (ref 78.0–100.0)
Platelets: 297 10*3/uL (ref 150–400)
RBC: 4.85 MIL/uL (ref 3.87–5.11)
RDW: 14.3 % (ref 11.5–15.5)
WBC: 18.4 10*3/uL — AB (ref 4.0–10.5)

## 2016-03-13 MED ORDER — POTASSIUM CHLORIDE IN NACL 40-0.9 MEQ/L-% IV SOLN: INTRAVENOUS | Status: DC

## 2016-03-13 MED ORDER — CHLORHEXIDINE GLUCONATE CLOTH 2 % EX PADS: 6.0000 | MEDICATED_PAD | Freq: Every day | CUTANEOUS | Status: DC

## 2016-03-13 MED ORDER — OXYCODONE-ACETAMINOPHEN 10-325 MG PO TABS: 1.0000 | ORAL_TABLET | ORAL | Status: DC | PRN

## 2016-03-13 MED ORDER — ONDANSETRON 8 MG PO TBDP: 8.0000 mg | ORAL_TABLET | Freq: Three times a day (TID) | ORAL | Status: DC | PRN

## 2016-03-13 MED ORDER — MUPIROCIN 2 % EX OINT
1.0000 "application " | TOPICAL_OINTMENT | Freq: Two times a day (BID) | CUTANEOUS | Status: DC
  Administered 2016-03-13: 1 via NASAL
  Filled 2016-03-13: qty 22

## 2016-03-13 NOTE — Progress Notes (Signed)
Discharge instructions read to patient and her family.  All verbalized understanding of instructions.Discharged to home with family 

## 2016-03-13 NOTE — Progress Notes (Signed)
SURGICAL PROGRESS NOTE   Hospital Day(s): 2.   Post op day(s): 1 Day Post-Op.   Interval History: Patient seen and examined, no acute events or new complaints overnight. Patient reports improved, but not entirely resolved RUQ pain, with her able to now ambulate without difficulty. Patient denies N/V, CP, SOB, or fever/chills.  Review of Systems:  Constitutional: denies fever, chills  HEENT: denies cough or congestion  Respiratory: denies any shortness of breath  Cardiovascular: denies chest pain or palpitations  Gastrointestinal: abdominal pain as per HPI, denies N/V, diarrhea or constipation  Musculoskeletal: denies pain, decreased motor or sensation  Neurological: denies HA or vision/hearing changes   Vital signs in last 24 hours: [min-max] current  Temp:  [97.7 F (36.5 C)-99 F (37.2 C)] 98.4 F (36.9 C) (06/01 0521) Pulse Rate:  [55-83] 83 (06/01 0521) Resp:  [15-29] 20 (06/01 0521) BP: (139-177)/(55-78) 172/57 mmHg (06/01 0521) SpO2:  [91 %-100 %] 97 % (06/01 0521)     Height: 5\' 4"  (162.6 cm) Weight: 99.791 kg (220 lb) BMI (Calculated): 37.8   Intake/Output this shift:  Total I/O In: 360 [P.O.:360] Out: -    Intake/Output last 2 shifts:  @IOLAST2SHIFTS @   Physical Exam:  Constitutional: alert, cooperative and no distress  HENT: normocephalic without obvious abnormality  Eyes: PERRL, EOM's grossly intact and symmetric  Neuro: CN II - XII grossly intact and symmetric without deficit  Respiratory: breathing non-labored at rest  Cardiovascular: regular rate and sinus rhythm  Gastrointestinal: soft and obese with mild - moderate RUQ tenderness, incisional dressings c/d/i Musculoskeletal: UE and LE FROM, no edema or wounds, motor and sensation grossly intact, NT   Labs:  CBC:  Lab Results  Component Value Date   WBC 18.4* 03/13/2016   WBC 20.1* 07/16/2015   RBC 4.85 03/13/2016   RBC 5.16 07/16/2015   HEMOGLOBIN 15.4 07/16/2015   BMP:  Lab Results   Component Value Date   GLUCOSE 122* 03/13/2016   CO2 25 03/13/2016   BUN <5* 03/13/2016   CREATININE 0.53 03/13/2016   CALCIUM 8.6* 03/13/2016     Imaging studies: No new pertinent imaging studies    Assessment/Plan:  65 y.o. female POD #1 s/p laparoscopic cholecystectomy for acute on chronic cholecystitis with intraoperative findings of severe inflammation and gallbladder hydrops, complicated by pertinent comorbidities including obesity, COPD, hypertension, and multiple pain disorders including chronic back pain.  - pain control as needed  - out of bed and ambulation encouraged  - okay to advance diet as tolerated  - discharge planning when pain controlled, tolerating diet, and ambulating - patient will require post-operative follow-up at surgery office in 2 weeks  - medical management of comorbidities as per medical team  All of the above findings and recommendations were discussed with the patient, and all of her questions were answered to her expressed satisfaction. Patient expressed particular gratitude to learn confirmation of her diagnosis.  -- Scherrie Gerlach Earlene Plater, MD [place]: [facility] General [facility] V[facility] Office: [phone]

## 2016-03-13 NOTE — Care Management Important Message (Signed)
Important Message  Patient Details  Name: Carla [name] MRN: [id number] Date of Birth: [birth year]   Medicare Important Message Given:  Yes    Adonis Huguenin, RN 03/13/2016, 2:41 PM

## 2016-03-13 NOTE — Discharge Summary (Signed)
Physician Discharge Summary  Carla [name]:[id number] DOB: 1951-09-27 DOA: 03/10/2016  PCP: [name], MD  Admit date: 03/10/2016 Discharge date: 03/13/2016  Time spent: 35 minutes  Recommendations for Outpatient Follow-up:  1. Follow up with PCP in 1-2 weeks.  2. Follow up in 2 weeks for outpatient post-op with Dr. Earlene Plater, general surgery.   Discharge Diagnoses:  Active Problems:   COPD (chronic obstructive pulmonary disease) (HCC)   Cholecystitis, acute   Obesity, morbid (HCC)   Cholelithiasis   Syncope and collapse   Intractable nausea and vomiting   Abdominal pain   Discharge Condition: Improved   Diet recommendation: heart heathy/ low-fat  Filed Weights   03/10/16 1259 03/10/16 2120 03/12/16 1123  Weight: 101.152 kg (223 lb) 100.018 kg (220 lb 8 oz) 99.791 kg (220 lb)    History of present illness:  65 yof with medical history of prediabetes, HTNM COPD, presented with complaints of abdominal pain. She also complains of associated diarrhea, poor oral intake, nausea and vomiting. She reported falling out on the kitchen floor and daughter called EMS. No reports of LOC or trauma from fall. Of note patient was discharged on 03/10/2016 for abdominal pain with oral pain management. Patient reported she did not fill her given outpatient prescription.She was referred for admission.   Hospital Course:  Pt was admitted for acute cholecystitis, noted on CT abdomen. HIDA scan revealed non-filing othe gallbladder is most consistent with cystic duct obstruction associated with acute cholecystitis. General surgery evaluated and performed laparoscopic cholecystectomy 5/31 without complications. Post-op her overall pain is being controlled with oral pain medications. She is tolerating a diet and is ambulating. She has been instructed to follow up as outpatient with general surgery for post-op evaluation.  1. Epigastric abdominal pain with associated intractable nausea and vomiting.  Resolved with pain management.  2. Essential HTN. Stable. Continue home medications.  3. COPD. Stable.  4. Hypokalemia. Replaced.   Procedures:  Laparoscopic Cholecystectomy 5/31'  Consultations:  General surgery   Discharge Exam: Filed Vitals:   03/12/16 2211 03/13/16 0521  BP: 146/55 172/57  Pulse: 67 83  Temp: 98.4 F (36.9 C) 98.4 F (36.9 C)  Resp: 20 20    General: NAD, looks comfortable Cardiovascular: RRR, S1, S2  Respiratory: clear bilaterally, No wheezing, rales or rhonchi Abdomen: soft, diffusely tender, incision consistent with recent surgery  Musculoskeletal: No edema b/l  Discharge Instructions   Discharge Instructions    Diet - low sodium heart healthy    Complete by:  As directed      Increase activity slowly    Complete by:  As directed           Current Discharge Medication List    START taking these medications   Details  ondansetron (ZOFRAN ODT) 8 MG disintegrating tablet Take 1 tablet (8 mg total) by mouth every 8 (eight) hours as needed for nausea or vomiting. Qty: 20 tablet, Refills: 0      CONTINUE these medications which have CHANGED   Details  oxyCODONE-acetaminophen (PERCOCET) 10-325 MG tablet Take 1 tablet by mouth every 4 (four) hours as needed for pain. Qty: 20 tablet, Refills: 0      CONTINUE these medications which have NOT CHANGED   Details  albuterol (PROVENTIL HFA;VENTOLIN HFA) 108 (90 BASE) MCG/ACT inhaler Inhale 2 puffs into the lungs every 6 (six) hours as needed for wheezing. Qty: 1 Inhaler, Refills: 2    amLODipine (NORVASC) 2.5 MG tablet Take 2 tablets (5 mg  total) by mouth daily.    aspirin EC 81 MG tablet Take 1 tablet (81 mg total) by mouth daily.    Cholecalciferol (VITAMIN D) 2000 units CAPS Take 1 capsule by mouth daily.    cyclobenzaprine (FLEXERIL) 10 MG tablet Take 10 mg by mouth 3 (three) times daily as needed for muscle spasms.    diazepam (VALIUM) 5 MG tablet Take 5 mg by mouth 3 (three) times  daily as needed for anxiety.    dimenhyDRINATE (DRAMAMINE) 50 MG tablet Take 50 mg by mouth every 8 (eight) hours as needed for dizziness.    DULoxetine (CYMBALTA) 60 MG capsule Take 60 mg by mouth at bedtime.     EVZIO 0.4 MG/0.4ML SOAJ Inject 0.4 mLs into the muscle once as needed (for Anaphylaxis/allergic reaction).     gabapentin (NEURONTIN) 300 MG capsule Take 300 mg by mouth 3 (three) times daily.    hydrOXYzine (VISTARIL) 25 MG capsule Take 25 mg by mouth at bedtime.    metFORMIN (GLUCOPHAGE-XR) 500 MG 24 hr tablet Take 500 mg by mouth daily with breakfast.     metoprolol succinate (TOPROL-XL) 50 MG 24 hr tablet Take 50 mg by mouth at bedtime.    omeprazole (PRILOSEC) 20 MG capsule Take 1 capsule (20 mg total) by mouth daily. Qty: 30 capsule, Refills: 0    VOLTAREN 1 % GEL Apply 4 g topically every 8 (eight) hours.       STOP taking these medications     ciprofloxacin (CIPRO) 500 MG tablet        Allergies  Allergen Reactions  . Bee Venom Anaphylaxis  . Coconut Flavor Anaphylaxis    Per patient  . Mushroom Extract Complex Anaphylaxis    Extreme sweating, vomiting  . Amitriptyline Other (See Comments)    Has nightmares when using, not in right state of mind   . Toradol [Ketorolac Tromethamine] Nausea And Vomiting  . Tramadol Nausea And Vomiting  . Trazodone And Nefazodone Nausea And Vomiting   Follow-up Information    Follow up with Ancil Linsey, MD. Schedule an appointment as soon as possible for a visit in 2 weeks.   Specialty:  General Surgery   Contact information:   [address] [place] [facility] [address] [phone]        The results of significant diagnostics from this hospitalization (including imaging, microbiology, ancillary and laboratory) are listed below for reference.    Significant Diagnostic Studies: Dg Chest 2 View  03/10/2016  CLINICAL DATA:  Nausea and vomiting.  Abdominal pain EXAM: CHEST  2 VIEW COMPARISON:  03/07/2016  FINDINGS: The heart size and mediastinal contours are within normal limits. Both lungs are clear. The visualized skeletal structures are unremarkable. IMPRESSION: No active cardiopulmonary disease. Electronically Signed   By: Marlan Palau M.D.   On: 03/10/2016 17:41   US Abdomen Complete  03/07/2016  CLINICAL DATA:  Abdominal pain with nausea, vomiting, and diarrhea since 2 a.m. EXAM: ABDOMEN ULTRASOUND COMPLETE COMPARISON:  Renal ultrasound 06/30/2015 FINDINGS: Gallbladder: Gallstones including a large stone in the neck. The gallbladder is full. Reportedly no focal tenderness but medicated. Mild wall thickening (up to 6 mm) and striation. Common bile duct: Diameter: 5 mm Liver: Echogenic with poor acoustic penetration. No focal lesion. Antegrade flow in the imaged hepatic and portal venous system. IVC: No abnormality visualized. Pancreas: Visualized portion unremarkable. Spleen: Size and appearance within normal limits. Right Kidney: Length: 13.6 cm. Echogenicity within normal limits. No mass or hydronephrosis visualized.  Left Kidney: Length: 13 cm. Echogenicity within normal limits. No mass or hydronephrosis visualized. Abdominal aorta: Obscured by bowel gas. No visible aneurysm on 10/23/2015 lumbar spine MRI. Other findings: Difficult study due to patient's size and bowel gas. IMPRESSION: 1. Possible acute cholecystitis due to calculi and wall thickening. No sonographic Murphy's sign, but potentially affected by analgesic medication. 2. Hepatic steatosis. Electronically Signed   By: Marnee Spring M.D.   On: 03/07/2016 08:27   Ct Abdomen Pelvis W Contrast  03/10/2016  CLINICAL DATA:  Acute upper abdominal pain. EXAM: CT ABDOMEN AND PELVIS WITH CONTRAST TECHNIQUE: Multidetector CT imaging of the abdomen and pelvis was performed using the standard protocol following bolus administration of intravenous contrast. CONTRAST:  ISOVUE-300 IOPAMIDOL (ISOVUE-300) INJECTION 61% COMPARISON:  Ultrasound of Mar 07, 2016. FINDINGS: Visualized lung bases are unremarkable. No significant osseous abnormality is noted. Fatty infiltration of the liver is noted with sparing around the gallbladder fossa. The spleen and pancreas appear normal. Large calculus is noted in gallbladder neck with mild gallbladder wall thickening concerning for acute cholecystitis. Adrenal glands and kidneys appear normal. No hydronephrosis or renal obstruction is noted. Atherosclerosis of abdominal aorta is noted without aneurysm formation. There is no evidence of bowel obstruction. No abnormal fluid collection is noted. Urinary bladder appears normal. Uterus and ovaries appear normal. No significant adenopathy is noted. IMPRESSION: Fatty infiltration of the liver is noted. Large solitary gallstone noted in neck of gallbladder. Mild gallbladder wall thickening is noted concerning for acute cholecystitis. HIDA scan may be performed for further evaluation. Electronically Signed   By: Lupita Raider, M.D.   On: 03/10/2016 17:19   Nm Hepato W/eject Fract  03/11/2016  CLINICAL DATA:  Abdominal pain nausea and vomiting. Nausea vomiting since 03/07/2016. Concern for acute cholecystitis on CT ultrasound imaging. EXAM: NUCLEAR MEDICINE HEPATOBILIARY IMAGING TECHNIQUE: Patient facet 48 hours therefore cholecystokinin (2 mcg) was administered prior to initiation of the radiotracer injection to contract gallbladder. Sequential images of the abdomen were obtained out to 60 minutes following intravenous administration of radiopharmaceutical. Gallbladder was not evident 60 minutes therefore 3 mg IV morphine was administered to contracted sphincter very RADIOPHARMACEUTICALS:  4.8 mCi Tc-12m  Choletec IV COMPARISON:  CT 03/10/2016, ultrasound 526 17 FINDINGS: Patient fasted for greater than 48 hours therefore cholecystokinin was administered prior to the initiation of study. Prompt clearance radiotracer from the blood pool and homogeneous uptake within liver  parenchyma. Counts are evident within the small bowel by 20 minutes. No filling of the gallbladder present at 60 minutes therefore IV morphine was administered. No filling of the gallbladder following morphine administration. Counts are still evident within the common bile duct at termination of study. IMPRESSION: Non filling of the gallbladder is most consistent with cystic duct obstruction associated with acute cholecystitis. These results will be called to the ordering clinician or representative by the Radiologist Assistant, and communication documented in the PACS or zVision Dashboard. Electronically Signed   By: Genevive Bi M.D.   On: 03/11/2016 14:28   Dg Chest Port 1 View  03/07/2016  CLINICAL DATA:  Chest discomfort.  Shortness of breath. EXAM: PORTABLE CHEST 1 VIEW COMPARISON:  01/10/2016 FINDINGS: Progressive bronchitic change from prior. Increased infrahilar atelectasis bilaterally. Cardiomediastinal contours are unchanged. No confluent airspace disease, large pleural effusion or pneumothorax. IMPRESSION: Increased bronchitic change and infrahilar atelectasis. Electronically Signed   By: Rubye Oaks M.D.   On: 03/07/2016 06:26    Microbiology: Recent Results (from the past 240 hour(s))  Urine culture     Status: Abnormal   Collection Time: 03/07/16 11:00 AM  Result Value Ref Range Status   Specimen Description URINE, CLEAN CATCH  Final   Special Requests Normal  Final   Culture MULTIPLE SPECIES PRESENT, SUGGEST RECOLLECTION (A)  Final   Report Status 03/09/2016 FINAL  Final  Urine culture     Status: None   Collection Time: 03/10/16  2:50 PM  Result Value Ref Range Status   Specimen Description URINE, RANDOM  Final   Special Requests NONE  Final   Culture NO GROWTH Performed at [facility]   Final   Report Status 03/12/2016 FINAL  Final  Surgical pcr screen     Status: Abnormal   Collection Time: 03/12/16  7:16 AM  Result Value Ref Range Status   MRSA, PCR  NEGATIVE NEGATIVE Final   Staphylococcus aureus POSITIVE (A) NEGATIVE Final    Comment:        The Xpert SA Assay (FDA approved for NASAL specimens in patients over 65 years of age), is one component of a comprehensive surveillance program.  Test performance has been validated by [facility] for patients greater than or equal to 54 year old. It is not intended to diagnose infection nor to guide or monitor treatment. RESULT CALLED TO, READ BACK BY AND VERIFIED WITH: MORRIS,C ON 03/12/16 @ 0930 BY MITCHELL,S      Labs: Basic Metabolic Panel:  Recent Labs Lab 03/09/16 0656 03/10/16 1338 03/11/16 0559 03/12/16 0555 03/13/16 0644  NA 136 140 140 140 138  K 3.8 4.0 3.4* 3.0* 3.7  CL 102 104 103 103 105  CO2 27 25 28 29 25   GLUCOSE 102* 111* 97 108* 122*  BUN 9 7 7  <5* <5*  CREATININE 0.53 0.55 0.59 0.50 0.53  CALCIUM 8.4* 9.5 8.7* 8.5* 8.6*   Liver Function Tests:  Recent Labs Lab 03/07/16 0634 03/08/16 0637 03/10/16 1338 03/12/16 0555 03/13/16 0644  AST 17 21 15 15  39  ALT 13* 12* 11* 12* 19  ALKPHOS 95 94 86 72 69  BILITOT 0.4 0.9 0.5 0.5 0.6  PROT 7.4 7.0 7.3 6.4* 6.5  ALBUMIN 4.1 3.6 3.6 3.2* 3.2*    Recent Labs Lab 03/07/16 0634 03/10/16 1338  LIPASE 12 11   No results for input(s): AMMONIA in the last 168 hours. CBC:  Recent Labs Lab 03/07/16 0634 03/08/16 0637 03/09/16 0656 03/10/16 1338 03/11/16 0559 03/13/16 0644  WBC 18.2* 13.8* 11.8* 11.1* 10.9* 18.4*  NEUTROABS 12.2*  --   --  6.8  --   --   HGB 15.6* 16.1* 14.5 14.5 14.2 14.2  HCT 47.3* 50.5* 46.1* 45.1 44.0 44.4  MCV 91.1 93.0 92.4 91.9 92.1 91.5  PLT 284 237 260 266 268 297   Cardiac Enzymes:  Recent Labs Lab 03/07/16 0634 03/10/16 1338  TROPONINI <0.03 <0.03   CBG:  Recent Labs Lab 03/09/16 0750 03/09/16 1151 03/09/16 1537 03/12/16 1117 03/12/16 1456  GLUCAP 103* 86 130* 126* 200*       Signed:  Erick Blinks, MD  [facility] 03/13/2016, 1:26  PM     By signing my name below, I, Zadie Cleverly, attest that this documentation has been prepared under the direction and in the presence of Carla Blinks, MD. Electronically signed: Zadie Cleverly, Scribe. 03/13/2016 12:58pm   I, Dr. Erick Blinks, personally performed the services described in this documentaiton. All medical record entries made by the scribe were at my direction and in  my presence. I have reviewed the chart and agree that the record reflects my personal performance and is accurate and complete  Carla Blinks, MD, 03/13/2016 1:26 PM

## 2016-03-13 NOTE — Addendum Note (Signed)
Addendum  created 03/13/16 1050 by Despina Hidden, CRNA   Modules edited: Clinical Notes   Clinical Notes:  File: [id number]

## 2016-03-13 NOTE — Anesthesia Postprocedure Evaluation (Signed)
Anesthesia Post Note  Patient: Carla [name]  Procedure(s) Performed: Procedure(s) (LRB): LAPAROSCOPIC CHOLECYSTECTOMY (N/A)  Patient location during evaluation: Nursing Unit Anesthesia Type: General Level of consciousness: awake and alert, oriented and patient cooperative Pain management: pain level controlled Vital Signs Assessment: post-procedure vital signs reviewed and stable Respiratory status: nonlabored ventilation, spontaneous breathing and respiratory function stable Cardiovascular status: blood pressure returned to baseline Postop Assessment: no signs of nausea or vomiting Anesthetic complications: no    Last Vitals:  Filed Vitals:   03/12/16 2211 03/13/16 0521  BP: 146/55 172/57  Pulse: 67 83  Temp: 36.9 C 36.9 C  Resp: 20 20    Last Pain:  Filed Vitals:   03/13/16 1042  PainSc: 10-Worst pain ever                 Kaidon Kinker J

## 2016-03-31 ENCOUNTER — Encounter (HOSPITAL_COMMUNITY): Payer: Self-pay | Admitting: Emergency Medicine

## 2016-03-31 ENCOUNTER — Emergency Department (HOSPITAL_COMMUNITY)
Admission: EM | Admit: 2016-03-31 | Discharge: 2016-03-31 | Disposition: A | Discharge status: Home / Self Care | Payer: Medicare Other | Attending: Emergency Medicine | Admitting: Emergency Medicine

## 2016-03-31 DIAGNOSIS — J449 Chronic obstructive pulmonary disease, unspecified: Secondary | ICD-10-CM | POA: Insufficient documentation

## 2016-03-31 DIAGNOSIS — F329 Major depressive disorder, single episode, unspecified: Secondary | ICD-10-CM | POA: Insufficient documentation

## 2016-03-31 DIAGNOSIS — Z79899 Other long term (current) drug therapy: Secondary | ICD-10-CM | POA: Diagnosis not present

## 2016-03-31 DIAGNOSIS — R4182 Altered mental status, unspecified: Secondary | ICD-10-CM | POA: Insufficient documentation

## 2016-03-31 DIAGNOSIS — I10 Essential (primary) hypertension: Secondary | ICD-10-CM | POA: Insufficient documentation

## 2016-03-31 DIAGNOSIS — M1990 Unspecified osteoarthritis, unspecified site: Secondary | ICD-10-CM | POA: Diagnosis not present

## 2016-03-31 DIAGNOSIS — Z87891 Personal history of nicotine dependence: Secondary | ICD-10-CM | POA: Diagnosis not present

## 2016-03-31 DIAGNOSIS — Z7982 Long term (current) use of aspirin: Secondary | ICD-10-CM | POA: Diagnosis not present

## 2016-03-31 LAB — RAPID URINE DRUG SCREEN, HOSP PERFORMED
Amphetamines: NOT DETECTED
Barbiturates: NOT DETECTED
Benzodiazepines: POSITIVE — AB
Cocaine: NOT DETECTED
OPIATES: NOT DETECTED
Tetrahydrocannabinol: NOT DETECTED

## 2016-03-31 LAB — URINALYSIS, ROUTINE W REFLEX MICROSCOPIC
BILIRUBIN URINE: NEGATIVE
Glucose, UA: NEGATIVE mg/dL
HGB URINE DIPSTICK: NEGATIVE
Ketones, ur: NEGATIVE mg/dL
Leukocytes, UA: NEGATIVE
Nitrite: NEGATIVE
PROTEIN: NEGATIVE mg/dL
Specific Gravity, Urine: >1.03 — ABNORMAL HIGH (ref 1.005–1.030)
pH: 5 (ref 5.0–8.0)

## 2016-03-31 LAB — COMPREHENSIVE METABOLIC PANEL
ALBUMIN: 4 g/dL (ref 3.5–5.0)
ALK PHOS: 94 U/L (ref 38–126)
ALT: 18 U/L (ref 14–54)
AST: 26 U/L (ref 15–41)
Anion gap: 9 (ref 5–15)
BUN: 15 mg/dL (ref 6–20)
CALCIUM: 9.2 mg/dL (ref 8.9–10.3)
CHLORIDE: 103 mmol/L (ref 101–111)
CO2: 23 mmol/L (ref 22–32)
CREATININE: 0.85 mg/dL (ref 0.44–1.00)
GFR calc Af Amer: >60 mL/min (ref >60)
GFR calc non Af Amer: >60 mL/min (ref >60)
GLUCOSE: 96 mg/dL (ref 65–99)
Potassium: 4 mmol/L (ref 3.5–5.1)
SODIUM: 135 mmol/L (ref 135–145)
Total Bilirubin: 0.9 mg/dL (ref 0.3–1.2)
Total Protein: 7.7 g/dL (ref 6.5–8.1)

## 2016-03-31 LAB — AMMONIA: Ammonia: 27 umol/L (ref 9–35)

## 2016-03-31 LAB — CBC
HCT: 50.8 % — ABNORMAL HIGH (ref 36.0–46.0)
HEMOGLOBIN: 16.5 g/dL — AB (ref 12.0–15.0)
MCH: 29.7 pg (ref 26.0–34.0)
MCHC: 32.5 g/dL (ref 30.0–36.0)
MCV: 91.5 fL (ref 78.0–100.0)
PLATELETS: 340 10*3/uL (ref 150–400)
RBC: 5.55 MIL/uL — AB (ref 3.87–5.11)
RDW: 14.1 % (ref 11.5–15.5)
WBC: 12.9 10*3/uL — ABNORMAL HIGH (ref 4.0–10.5)

## 2016-03-31 LAB — CBG MONITORING, ED: GLUCOSE-CAPILLARY: 126 mg/dL — AB (ref 65–99)

## 2016-03-31 MED ORDER — SODIUM CHLORIDE 0.9 % IV BOLUS (SEPSIS)
500.0000 mL | Freq: Once | INTRAVENOUS | Status: AC
  Administered 2016-03-31: 500 mL via INTRAVENOUS

## 2016-03-31 MED ORDER — ZOLPIDEM TARTRATE 10 MG PO TABS: 10.0000 mg | ORAL_TABLET | Freq: Every evening | ORAL | Status: DC | PRN

## 2016-03-31 NOTE — Discharge Instructions (Signed)
Tests show no life-threatening condition. Prescription for sleeping medication. Follow up your primary care doctor.

## 2016-03-31 NOTE — ED Provider Notes (Signed)
CSN: [id number]     Arrival date & time 03/31/16  1311 History   First MD Initiated Contact with Patient 03/31/16 1338     Chief Complaint  Patient presents with  . Altered Mental Status     (Consider location/radiation/quality/duration/timing/severity/associated sxs/prior Treatment) Patient is a 65 y.o. female presenting with altered mental status.  Altered Mental Status ...Marland KitchenMarland KitchenLevel V caveat for altered mental status. Patient arrived by EMS. Tearful, inconsolable, asking for her grandson. No prodromal illnesses. No fever, sweats, chills, chest pain, dyspnea, neurodeficits, stiff neck, cough, dysuria. Upon arrival into the emergency department, she appeared to be acting more normally. Review systems positive for headache. Past medical history is very complex. Patient states she hasn't slept in 3 days.  Past Medical History  Diagnosis Date  . Arthritis   . Bronchitis   . Hypertension   . Chronic back pain   . Chronic knee pain   . COPD (chronic obstructive pulmonary disease) (HCC)   . Memory difficulty 2015  . Weakness of both legs   . 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 Pain management   . Frequent falls   . Dizziness   . 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 Prediabetes   . 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 Knee cartilage surgery Right 2007  . Appendectomy  1971  . Tonsillectomy  1971  . Cholecystectomy N/A 03/12/2016    Procedure: LAPAROSCOPIC CHOLECYSTECTOMY;  Surgeon: Ancil Linsey, MD;  Location: [facility];  Service: General;  Laterality: N/A;   Family History  Problem Relation Age of Onset  . Heart failure Father   . Diabetes Father   . Kidney failure Father    Social History  Substance Use Topics  . Smoking status: Former Smoker -- 0.50 packs/day for 10 years    Types: Cigarettes    Start date:  08/05/1970    Quit date: 03/03/2016  . Smokeless tobacco: Never Used     Comment: 04/30/15 less than 1 PPD  . Alcohol Use: 0.0 oz/week    0 Standard drinks or equivalent per week     Comment: Very rare   OB History    Gravida Para Term Preterm AB TAB SAB Ectopic Multiple Living   Review of Systems  Reason unable to perform ROS: Altered mental status.      Allergies  Bee venom; Coconut flavor; Mushroom extract complex; Amitriptyline; Toradol; Tramadol; and Trazodone and nefazodone  Home Medications   Prior to Admission medications   Medication Sig Start Date End Date Taking? Authorizing Provider  albuterol (PROVENTIL HFA;VENTOLIN HFA) 108 (90 BASE) MCG/ACT inhaler Inhale 2 puffs into the lungs every 6 (six) hours as needed for wheezing. 06/21/13  Yes Nimish Normajean Glasgow, MD  amLODipine (NORVASC) 2.5 MG tablet Take 2 tablets (5 mg total) by mouth daily. 06/30/15  Yes Marjan Rabbani, MD  aspirin EC 81 MG tablet Take 1 tablet (81 mg total) by mouth daily. 12/22/15  Yes Henderson Cloud, MD  Cholecalciferol (VITAMIN D) 2000 units CAPS Take 1 capsule by mouth daily.   Yes Historical Provider, MD  cyclobenzaprine (FLEXERIL) 10 MG tablet Take 10 mg by mouth 3 (three) times daily as needed for muscle spasms.   Yes Historical Provider, MD  diazepam (VALIUM) 5 MG tablet Take 5 mg  by mouth 3 (three) times daily as needed for anxiety.   Yes Historical Provider, MD  dimenhyDRINATE (DRAMAMINE) 50 MG tablet Take 50 mg by mouth every 8 (eight) hours as needed for dizziness.   Yes Historical Provider, MD  DULoxetine (CYMBALTA) 60 MG capsule Take 60 mg by mouth at bedtime.    Yes Historical Provider, MD  gabapentin (NEURONTIN) 300 MG capsule Take 300 mg by mouth 3 (three) times daily.   Yes Historical Provider, MD  hydrOXYzine (VISTARIL) 25 MG capsule Take 25 mg by mouth at bedtime.   Yes Historical Provider, MD  metoprolol succinate (TOPROL-XL) 50 MG 24 hr tablet Take 50 mg by  mouth at bedtime. 12/14/15  Yes Historical Provider, MD  omeprazole (PRILOSEC) 20 MG capsule Take 1 capsule (20 mg total) by mouth daily. 06/30/15  Yes Marjan Rabbani, MD  ondansetron (ZOFRAN ODT) 8 MG disintegrating tablet Take 1 tablet (8 mg total) by mouth every 8 (eight) hours as needed for nausea or vomiting. 03/13/16  Yes Erick Blinks, MD  oxyCODONE-acetaminophen (PERCOCET) 10-325 MG tablet Take 1 tablet by mouth every 4 (four) hours as needed for pain. 03/13/16  Yes Erick Blinks, MD  VOLTAREN 1 % GEL Apply 4 g topically every 8 (eight) hours.  02/06/15  Yes Historical Provider, MD  EVZIO 0.4 MG/0.4ML SOAJ Inject 0.4 mLs into the muscle once as needed (for Anaphylaxis/allergic reaction).  02/19/15   Historical Provider, MD  zolpidem (AMBIEN) 10 MG tablet Take 1 tablet (10 mg total) by mouth at bedtime as needed for sleep. 03/31/16 04/30/16  Donnetta Hutching, MD   BP 135/48 mmHg  Pulse 52  Temp(Src) 98 F (36.7 C) (Oral)  Resp 18  SpO2 98% Physical Exam  Constitutional: She is oriented to person, place, and time.  Obese  HENT:  Head: Normocephalic and atraumatic.  Eyes: Conjunctivae and EOM are normal. Pupils are equal, round, and reactive to light.  Neck: Normal range of motion. Neck supple.  Cardiovascular: Normal rate and regular rhythm.   Pulmonary/Chest: Effort normal and breath sounds normal.  Abdominal: Soft. Bowel sounds are normal.  Musculoskeletal: Normal range of motion.  Neurological: She is alert and oriented to person, place, and time.  Skin: Skin is warm and dry.  Psychiatric: She has a normal mood and affect. Her behavior is normal.  Nursing note and vitals reviewed.   ED Course  Procedures (including critical care time) Labs Review Labs Reviewed  CBC - Abnormal; Notable for the following:    WBC 12.9 (*)    RBC 5.55 (*)    Hemoglobin 16.5 (*)    HCT 50.8 (*)    All other components within normal limits  URINALYSIS, ROUTINE W REFLEX [facility]CROSCOPIC (NOT [facility]) -  Abnormal; Notable for the following:    Specific Gravity, Urine >1.030 (*)    All other components within normal limits  URINE RAPID DRUG SCREEN, HOSP PERFORMED - Abnormal; Notable for the following:    Benzodiazepines POSITIVE (*)    All other components within normal limits  CBG MONITORING, ED - Abnormal; Notable for the following:    Glucose-Capillary 126 (*)    All other components within normal limits  COMPREHENSIVE METABOLIC PANEL  AMMONIA    Imaging Review No results found. I have personally reviewed and evaluated these images and lab results as part of my medical decision-making.   EKG Interpretation None      MDM   Final diagnoses:  Altered mental status, unspecified altered mental status type  Patient appears to have returned to baseline. Urinalysis shows no evidence of infection. Drug screen positive for benzodiazepines. Glucose normal. Ammonia normal.  Discussed test findings with the patient and her 2 daughters.    Donnetta Hutching, MD 03/31/16 1726

## 2016-03-31 NOTE — ED Notes (Signed)
Patient arrives via EMS from home. C/o AMS today by family. C/o headache. Pupils PERRL. Patient tearful, inconsolable, asking for a Fredric Mare. Patient alert, oriented to person, place, disoriented to time.

## 2016-03-31 NOTE — ED Notes (Signed)
Daughter to desk. Updated and advised dr Adriana Simas daughter wanted to speak with him.

## 2016-04-03 ENCOUNTER — Other Ambulatory Visit: Payer: Self-pay | Admitting: Neurosurgery

## 2016-04-03 DIAGNOSIS — M48061 Spinal stenosis, lumbar region without neurogenic claudication: Secondary | ICD-10-CM

## 2016-04-07 ENCOUNTER — Ambulatory Visit
Admission: RE | Admit: 2016-04-07 | Discharge: 2016-04-07 | Disposition: A | Discharge status: Home / Self Care | Payer: Medicare Other | Source: Ambulatory Visit | Attending: Neurosurgery | Admitting: Neurosurgery

## 2016-04-07 DIAGNOSIS — M48061 Spinal stenosis, lumbar region without neurogenic claudication: Secondary | ICD-10-CM

## 2016-04-07 MED ORDER — IOPAMIDOL (ISOVUE-M 200) INJECTION 41%
15.0000 mL | Freq: Once | INTRAMUSCULAR | Status: AC
  Administered 2016-04-07: 15 mL via INTRATHECAL

## 2016-04-07 MED ORDER — DIAZEPAM 5 MG PO TABS: 10.0000 mg | ORAL_TABLET | Freq: Once | ORAL | Status: DC

## 2016-04-07 MED ORDER — HYDROXYZINE HCL 50 MG/ML IM SOLN
7.5000 mg | Freq: Once | INTRAMUSCULAR | Status: AC
  Administered 2016-04-07: 25 mg via INTRAMUSCULAR

## 2016-04-07 MED ORDER — HYDROMORPHONE HCL 2 MG/ML IJ SOLN
1.5000 mg | Freq: Once | INTRAMUSCULAR | Status: AC
  Administered 2016-04-07: 0.75 mg via INTRAMUSCULAR

## 2016-04-07 MED ORDER — HYDROXYZINE HCL 50 MG/ML IM SOLN: 12.5000 mg | Freq: Once | INTRAMUSCULAR | Status: DC

## 2016-04-07 NOTE — Progress Notes (Signed)
Pt states Friday, last day she took Cymbalta.

## 2016-04-07 NOTE — Discharge Instructions (Signed)
Myelogram Discharge Instructions  1. Go home and rest quietly for the next 24 hours.  It is important to lie flat for the next 24 hours.  Get up only to go to the restroom.  You may lie in the bed or on a couch on your back, your stomach, your left side or your right side.  You may have one pillow under your head.  You may have pillows between your knees while you are on your side or under your knees while you are on your back.  2. DO NOT drive today.  Recline the seat as far back as it will go, while still wearing your seat belt, on the way home.  3. You may get up to go to the bathroom as needed.  You may sit up for 10 minutes to eat.  You may resume your normal diet and medications unless otherwise indicated.  Drink lots of extra fluids today and tomorrow.  4. The incidence of headache, nausea, or vomiting is about 5% (one in 20 patients).  If you develop a headache, lie flat and drink plenty of fluids until the headache goes away.  Caffeinated beverages may be helpful.  If you develop severe nausea and vomiting or a headache that does not go away with flat bed rest, call [phone].  5. You may resume normal activities after your 24 hours of bed rest is over; however, do not exert yourself strongly or do any heavy lifting tomorrow. If when you get up you have a headache when standing, go back to bed and force fluids for another 24 hours.  6. Call your physician for a follow-up appointment.  The results of your myelogram will be sent directly to your physician by the following day.  7. If you have any questions or if complications develop after you arrive home, please call [phone].  Discharge instructions have been explained to the patient.  The patient, or the person responsible for the patient, fully understands these instructions.       May resume Cymbalta on June 27,2017, after 1:00 pm.

## 2016-04-12 ENCOUNTER — Emergency Department (HOSPITAL_COMMUNITY)
Admission: EM | Admit: 2016-04-12 | Discharge: 2016-04-13 | Disposition: A | Discharge status: Home / Self Care | Payer: Medicare Other | Attending: Emergency Medicine | Admitting: Emergency Medicine

## 2016-04-12 ENCOUNTER — Encounter (HOSPITAL_COMMUNITY): Payer: Self-pay | Admitting: *Deleted

## 2016-04-12 ENCOUNTER — Emergency Department (HOSPITAL_COMMUNITY): Payer: Medicare Other

## 2016-04-12 DIAGNOSIS — F329 Major depressive disorder, single episode, unspecified: Secondary | ICD-10-CM | POA: Diagnosis not present

## 2016-04-12 DIAGNOSIS — Z79899 Other long term (current) drug therapy: Secondary | ICD-10-CM | POA: Diagnosis not present

## 2016-04-12 DIAGNOSIS — J449 Chronic obstructive pulmonary disease, unspecified: Secondary | ICD-10-CM | POA: Insufficient documentation

## 2016-04-12 DIAGNOSIS — R4182 Altered mental status, unspecified: Secondary | ICD-10-CM | POA: Diagnosis present

## 2016-04-12 DIAGNOSIS — N390 Urinary tract infection, site not specified: Secondary | ICD-10-CM | POA: Diagnosis not present

## 2016-04-12 DIAGNOSIS — I10 Essential (primary) hypertension: Secondary | ICD-10-CM | POA: Insufficient documentation

## 2016-04-12 DIAGNOSIS — Z7982 Long term (current) use of aspirin: Secondary | ICD-10-CM | POA: Insufficient documentation

## 2016-04-12 DIAGNOSIS — F17210 Nicotine dependence, cigarettes, uncomplicated: Secondary | ICD-10-CM | POA: Diagnosis not present

## 2016-04-12 DIAGNOSIS — F32A Depression, unspecified: Secondary | ICD-10-CM

## 2016-04-12 DIAGNOSIS — M1990 Unspecified osteoarthritis, unspecified site: Secondary | ICD-10-CM | POA: Diagnosis not present

## 2016-04-12 LAB — CBC
HEMATOCRIT: 49.3 % — AB (ref 36.0–46.0)
HEMOGLOBIN: 16.4 g/dL — AB (ref 12.0–15.0)
MCH: 30.3 pg (ref 26.0–34.0)
MCHC: 33.3 g/dL (ref 30.0–36.0)
MCV: 91.1 fL (ref 78.0–100.0)
Platelets: 216 10*3/uL (ref 150–400)
RBC: 5.41 MIL/uL — AB (ref 3.87–5.11)
RDW: 13.8 % (ref 11.5–15.5)
WBC: 14.8 10*3/uL — ABNORMAL HIGH (ref 4.0–10.5)

## 2016-04-12 LAB — URINALYSIS, ROUTINE W REFLEX MICROSCOPIC
Glucose, UA: NEGATIVE mg/dL
KETONES UR: 40 mg/dL — AB
Leukocytes, UA: NEGATIVE
NITRITE: NEGATIVE
PH: 5.5 (ref 5.0–8.0)
Protein, ur: 30 mg/dL — AB
Specific Gravity, Urine: >1.03 — ABNORMAL HIGH (ref 1.005–1.030)

## 2016-04-12 LAB — COMPREHENSIVE METABOLIC PANEL
ALT: 20 U/L (ref 14–54)
ANION GAP: 11 (ref 5–15)
AST: 25 U/L (ref 15–41)
Albumin: 4.1 g/dL (ref 3.5–5.0)
Alkaline Phosphatase: 98 U/L (ref 38–126)
BUN: 14 mg/dL (ref 6–20)
CALCIUM: 9.3 mg/dL (ref 8.9–10.3)
CHLORIDE: 104 mmol/L (ref 101–111)
CO2: 23 mmol/L (ref 22–32)
Creatinine, Ser: 0.86 mg/dL (ref 0.44–1.00)
GFR calc non Af Amer: >60 mL/min (ref >60)
Glucose, Bld: 99 mg/dL (ref 65–99)
POTASSIUM: 3.4 mmol/L — AB (ref 3.5–5.1)
SODIUM: 138 mmol/L (ref 135–145)
Total Bilirubin: 0.8 mg/dL (ref 0.3–1.2)
Total Protein: 7.4 g/dL (ref 6.5–8.1)

## 2016-04-12 LAB — RAPID URINE DRUG SCREEN, HOSP PERFORMED
AMPHETAMINES: NOT DETECTED
Barbiturates: NOT DETECTED
Benzodiazepines: POSITIVE — AB
Cocaine: NOT DETECTED
OPIATES: NOT DETECTED
TETRAHYDROCANNABINOL: NOT DETECTED

## 2016-04-12 LAB — URINE MICROSCOPIC-ADD ON

## 2016-04-12 LAB — ETHANOL: Alcohol, Ethyl (B): <5 mg/dL (ref <5)

## 2016-04-12 MED ORDER — CEPHALEXIN 500 MG PO CAPS: 500.0000 mg | ORAL_CAPSULE | Freq: Four times a day (QID) | ORAL | Status: DC

## 2016-04-12 MED ORDER — CEPHALEXIN 500 MG PO CAPS
500.0000 mg | ORAL_CAPSULE | Freq: Once | ORAL | Status: AC
  Administered 2016-04-12: 500 mg via ORAL
  Filled 2016-04-12: qty 1

## 2016-04-12 MED ORDER — SODIUM CHLORIDE 0.9 % IV BOLUS (SEPSIS)
1000.0000 mL | Freq: Once | INTRAVENOUS | Status: AC
  Administered 2016-04-12: 1000 mL via INTRAVENOUS

## 2016-04-12 NOTE — ED Notes (Signed)
Pt arrived to er by [facility] after being found walking down the sidewalk "going home" per ems and [employer] pt is living at a home, became upset because she felt like she was not getting her pain medication and started walking down the street with her walker, pt was unable to answer all questions correctly with ems so they presented to er with pt for further evaluation, on arrival to er pt thinks that it is [birth year] and Richard petty's birthday, unsure of which hospital she is at.. Complains of pain all over. Speech clear, cms intact all extremities,

## 2016-04-12 NOTE — Discharge Instructions (Signed)
Follow up with your family md next week. °

## 2016-04-12 NOTE — ED Notes (Signed)
Pt';s daughter phone number is [name] [phone]

## 2016-04-12 NOTE — ED Notes (Signed)
CBG with ems 121

## 2016-04-12 NOTE — ED Provider Notes (Signed)
CSN: [id number]     Arrival date & time 04/12/16  1945 History  By signing my name below, I, [facility], attest that this documentation has been prepared under the direction and in the presence of Bethann Berkshire, MD. Electronically Signed: Randell Patient, ED Scribe. 04/12/2016. 9:56 PM.   Chief Complaint  Patient presents with  . Altered Mental Status     Patient is a 65 y.o. female presenting with altered mental status. The history is provided by the patient. No language interpreter was used.  Altered Mental Status Presenting symptoms: confusion   Presenting symptoms: no combativeness   Severity:  Moderate Most recent episode:  Today Episode history:  Single Timing:  Constant Progression:  Unchanged Chronicity:  New Context: not head injury, not homeless and not a nursing home resident   Associated symptoms: no abdominal pain, no hallucinations, no headaches, no rash, no seizures, no slurred speech and no suicidal behavior    HPI Comments: Carla [name] is a 65 y.o. female brought in by EMS who presents to the Emergency Department with altered mental status. Pt states that she is from [place], [place] but has been staying with a caretaker in [place] and notes that this evening she was in pain despite taking her prescribed medications given to her by her caretaker after which she states that she wanted to return to her home in [place]. She reports that this same individual refused to give her her passport, driver's license, or car keys and that she left the house on foot and began to ambulate back home to [place]. She reports anxiety about her dog who is with police and generalized pain. Per nurse, the pt was found ambulating down the road with her walker and dog by police who notified EMS and brought the pt in to the ED for further evaluation. According to medical records, pt has been seen in the ED 10 times with three hospital admissions in the past six months. Denies any other complaints  currently.  Past Medical History  Diagnosis Date  . Arthritis   . Bronchitis   . Hypertension   . Chronic back pain   . Chronic knee pain   . COPD (chronic obstructive pulmonary disease) (HCC)   . Memory difficulty 2015  . Weakness of both legs   . 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 Pain management   . Frequent falls   . Dizziness   . 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 Prediabetes   . 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 Knee cartilage surgery Right 2007  . Appendectomy  1971  . Tonsillectomy  1971  . Cholecystectomy N/A 03/12/2016    Procedure: LAPAROSCOPIC CHOLECYSTECTOMY;  Surgeon: Ancil Linsey, MD;  Location: [facility];  Service: General;  Laterality: N/A;   Family History  Problem Relation Age of Onset  . Heart failure Father   . Diabetes Father   . Kidney failure Father    Social History  Substance Use Topics  . Smoking status: Former Smoker -- 0.50 packs/day for 10 years    Types: Cigarettes    Start date: 08/05/1970    Quit date: 03/03/2016  . Smokeless tobacco: Never Used     Comment: 04/30/15 less than 1 PPD  . Alcohol Use: 0.0 oz/week    0 Standard drinks or equivalent per week     Comment: Very  rare   OB History    Gravida Para Term Preterm AB TAB SAB Ectopic Multiple Living   2 2 2             Review of Systems  Constitutional: Negative for appetite change and fatigue.  HENT: Negative for congestion, ear discharge and sinus pressure.   Eyes: Negative for discharge.  Respiratory: Negative for cough.   Cardiovascular: Negative for chest pain.  Gastrointestinal: Negative for abdominal pain and diarrhea.  Genitourinary: Negative for frequency and hematuria.  Musculoskeletal: Positive for myalgias (generalized). Negative for back pain.  Skin: Negative for rash.  Neurological: Negative for  seizures and headaches.  Psychiatric/Behavioral: Positive for confusion. Negative for hallucinations. The patient is nervous/anxious.       Allergies  Bee venom; Coconut flavor; Mushroom extract complex; Amitriptyline; Toradol; Tramadol; and Trazodone and nefazodone  Home Medications   Prior to Admission medications   Medication Sig Start Date End Date Taking? Authorizing Provider  albuterol (PROVENTIL HFA;VENTOLIN HFA) 108 (90 BASE) MCG/ACT inhaler Inhale 2 puffs into the lungs every 6 (six) hours as needed for wheezing. 06/21/13  Yes Nimish Normajean Glasgow, MD  amLODipine (NORVASC) 2.5 MG tablet Take 2 tablets (5 mg total) by mouth daily. 06/30/15  Yes Marjan Rabbani, MD  aspirin EC 81 MG tablet Take 1 tablet (81 mg total) by mouth daily. 12/22/15  Yes Henderson Cloud, MD  Cholecalciferol (VITAMIN D) 2000 units CAPS Take 1 capsule by mouth daily.   Yes Historical Provider, MD  cyclobenzaprine (FLEXERIL) 10 MG tablet Take 10 mg by mouth 3 (three) times daily as needed for muscle spasms.   Yes Historical Provider, MD  diazepam (VALIUM) 5 MG tablet Take 5 mg by mouth 3 (three) times daily as needed for anxiety.   Yes Historical Provider, MD  dimenhyDRINATE (DRAMAMINE) 50 MG tablet Take 50 mg by mouth every 8 (eight) hours as needed for dizziness.   Yes Historical Provider, MD  DULoxetine (CYMBALTA) 60 MG capsule Take 60 mg by mouth at bedtime.    Yes Historical Provider, MD  EVZIO 0.4 MG/0.4ML SOAJ Inject 0.4 mLs into the muscle once as needed (for Anaphylaxis/allergic reaction).  02/19/15  Yes Historical Provider, MD  gabapentin (NEURONTIN) 300 MG capsule Take 300 mg by mouth 3 (three) times daily.   Yes Historical Provider, MD  hydrOXYzine (VISTARIL) 25 MG capsule Take 25 mg by mouth at bedtime.   Yes Historical Provider, MD  metoprolol succinate (TOPROL-XL) 50 MG 24 hr tablet Take 50 mg by mouth at bedtime. 12/14/15  Yes Historical Provider, MD  omeprazole (PRILOSEC) 20 MG capsule Take 1  capsule (20 mg total) by mouth daily. 06/30/15  Yes Marjan Rabbani, MD  ondansetron (ZOFRAN ODT) 8 MG disintegrating tablet Take 1 tablet (8 mg total) by mouth every 8 (eight) hours as needed for nausea or vomiting. 03/13/16  Yes Erick Blinks, MD  oxyCODONE-acetaminophen (PERCOCET) 10-325 MG tablet Take 1 tablet by mouth every 4 (four) hours as needed for pain. 03/13/16  Yes Erick Blinks, MD  VOLTAREN 1 % GEL Apply 4 g topically every 8 (eight) hours.  02/06/15  Yes Historical Provider, MD  zolpidem (AMBIEN) 10 MG tablet Take 1 tablet (10 mg total) by mouth at bedtime as needed for sleep. 03/31/16 04/30/16 Yes Donnetta Hutching, MD   BP 148/73 mmHg  Pulse 64  Temp(Src) 98.5 F (36.9 C) (Oral)  Resp 8  Ht 5\' 4"  (1.626 m)  Wt 217 lb (98.431 kg)  BMI 37.23 kg/m2  SpO2 94% Physical Exam  Constitutional: She appears well-developed.  HENT:  Head: Normocephalic.  Eyes: Conjunctivae and EOM are normal. No scleral icterus.  Neck: Neck supple. No thyromegaly present.  Cardiovascular: Normal rate and regular rhythm.  Exam reveals no gallop and no friction rub.   No murmur heard. Pulmonary/Chest: No stridor. She has no wheezes. She has no rales. She exhibits no tenderness.  Abdominal: She exhibits no distension. There is no tenderness. There is no rebound.  Musculoskeletal: Normal range of motion. She exhibits no edema.  Lymphadenopathy:    She has no cervical adenopathy.  Neurological: She is alert. She exhibits normal muscle tone. Coordination normal.  Pt is alert and oriented to person only.  Skin: No rash noted. No erythema.  Psychiatric: Her mood appears anxious.  Pt is anxious.  Depressed but not suicidal or homicidal    ED Course  Procedures   DIAGNOSTIC STUDIES: Oxygen Saturation is 98% on RA, normal by my interpretation.    COORDINATION OF CARE: 8:16 PM Will order IV fluids, head CT, and labs. Discussed treatment plan with pt at bedside and pt agreed to plan.   Labs Review Labs  Reviewed  COMPREHENSIVE METABOLIC PANEL - Abnormal; Notable for the following:    Potassium 3.4 (*)    All other components within normal limits  CBC - Abnormal; Notable for the following:    WBC 14.8 (*)    RBC 5.41 (*)    Hemoglobin 16.4 (*)    HCT 49.3 (*)    All other components within normal limits  URINE RAPID DRUG SCREEN, HOSP PERFORMED - Abnormal; Notable for the following:    Benzodiazepines POSITIVE (*)    All other components within normal limits  URINALYSIS, ROUTINE W REFLEX MICROSCOPIC (NOT AT [facility]) - Abnormal; Notable for the following:    Color, Urine AMBER (*)    APPearance CLOUDY (*)    Specific Gravity, Urine >1.030 (*)    Hgb urine dipstick TRACE (*)    Bilirubin Urine MODERATE (*)    Ketones, ur 40 (*)    Protein, ur 30 (*)    All other components within normal limits  URINE MICROSCOPIC-ADD ON - Abnormal; Notable for the following:    Squamous Epithelial / LPF 6-30 (*)    Bacteria, UA MANY (*)    All other components within normal limits  ETHANOL    Imaging Review Ct Head Wo Contrast  04/12/2016  CLINICAL DATA:  Initial evaluation for acute altered mental status. EXAM: CT HEAD WITHOUT CONTRAST TECHNIQUE: Contiguous axial images were obtained from the base of the skull through the vertex without intravenous contrast. COMPARISON:  Prior CT from 01/10/2016. FINDINGS: Cerebral volume within normal limits. No significant white matter disease. No acute intracranial hemorrhage or large vessel territory infarct. No mass lesion, midline shift or mass effect. No hydrocephalus. No extra-axial fluid collection. Scalp soft tissues demonstrate no acute abnormality. No acute abnormality about the globes and orbits. Mild scattered mucosal thickening within the left max O sinus and ethmoidal air cells. Paranasal sinuses are otherwise clear. Minimal opacity within the right mastoid air cells. Left mastoid air cells clear. Middle ear cavities are clear. Calvarium intact. IMPRESSION: 1.  No acute intracranial process identified. 2. Mild chronic left maxillary sinusitis. Electronically Signed   By: Rise Mu M.D.   On: 04/12/2016 21:48   I have personally reviewed and evaluated these images and lab results as part of my medical decision-making.   EKG Interpretation None  MDM   Final diagnoses:  None    Patient continues to be depressed. She is not suicidal or homicidal so she'll be discharged home. Her daughters picking her up. She will follow-up with her PCP. Also patient was placed on Keflex for possible UTI  Bethann Berkshire, MD 04/12/16 [phone]

## 2016-04-12 NOTE — ED Notes (Signed)
Spoke with pt's daughter who advised that she would let Cheri (pt's other daughter) know to come to er,

## 2016-04-15 LAB — URINE CULTURE: Special Requests: NORMAL

## 2016-04-16 ENCOUNTER — Encounter (HOSPITAL_COMMUNITY): Payer: Self-pay | Admitting: Emergency Medicine

## 2016-04-16 ENCOUNTER — Emergency Department (HOSPITAL_COMMUNITY): Payer: Medicare Other

## 2016-04-16 ENCOUNTER — Emergency Department (HOSPITAL_COMMUNITY)
Admission: EM | Admit: 2016-04-16 | Discharge: 2016-04-16 | Disposition: A | Discharge status: Home / Self Care | Payer: Medicare Other | Attending: Emergency Medicine | Admitting: Emergency Medicine

## 2016-04-16 DIAGNOSIS — S8392XA Sprain of unspecified site of left knee, initial encounter: Secondary | ICD-10-CM | POA: Insufficient documentation

## 2016-04-16 DIAGNOSIS — Y9389 Activity, other specified: Secondary | ICD-10-CM | POA: Diagnosis not present

## 2016-04-16 DIAGNOSIS — Y929 Unspecified place or not applicable: Secondary | ICD-10-CM | POA: Diagnosis not present

## 2016-04-16 DIAGNOSIS — S8391XA Sprain of unspecified site of right knee, initial encounter: Secondary | ICD-10-CM | POA: Diagnosis not present

## 2016-04-16 DIAGNOSIS — W1839XA Other fall on same level, initial encounter: Secondary | ICD-10-CM | POA: Diagnosis not present

## 2016-04-16 DIAGNOSIS — J449 Chronic obstructive pulmonary disease, unspecified: Secondary | ICD-10-CM | POA: Diagnosis not present

## 2016-04-16 DIAGNOSIS — Z87891 Personal history of nicotine dependence: Secondary | ICD-10-CM | POA: Diagnosis not present

## 2016-04-16 DIAGNOSIS — M1990 Unspecified osteoarthritis, unspecified site: Secondary | ICD-10-CM | POA: Diagnosis not present

## 2016-04-16 DIAGNOSIS — F329 Major depressive disorder, single episode, unspecified: Secondary | ICD-10-CM | POA: Insufficient documentation

## 2016-04-16 DIAGNOSIS — F6811 Factitious disorder with predominantly psychological signs and symptoms: Secondary | ICD-10-CM | POA: Diagnosis not present

## 2016-04-16 DIAGNOSIS — Z7982 Long term (current) use of aspirin: Secondary | ICD-10-CM | POA: Insufficient documentation

## 2016-04-16 DIAGNOSIS — Y999 Unspecified external cause status: Secondary | ICD-10-CM | POA: Insufficient documentation

## 2016-04-16 DIAGNOSIS — I10 Essential (primary) hypertension: Secondary | ICD-10-CM | POA: Diagnosis not present

## 2016-04-16 DIAGNOSIS — S8992XA Unspecified injury of left lower leg, initial encounter: Secondary | ICD-10-CM | POA: Diagnosis present

## 2016-04-16 NOTE — ED Notes (Signed)
Patients family member contacted for right home.

## 2016-04-16 NOTE — ED Provider Notes (Signed)
CSN: [id number]     Arrival date & time 04/16/16  1434 History   First MD Initiated Contact with Patient 04/16/16 1441     Chief Complaint  Patient presents with  . Fall    Patient is a 65 y.o. female presenting with fall. The history is provided by the patient.  Fall This is a new problem. Episode onset: earlier today. The problem occurs constantly. Pertinent negatives include no headaches. Exacerbated by: movement. The symptoms are relieved by rest.  pt reports she was bending over to pick up her mail she fell onto knees.  No head injury She reports after falling on knees this triggered her back pain as well This all occurred from standing   Past Medical History  Diagnosis Date  . Arthritis   . Bronchitis   . Hypertension   . Chronic back pain   . Chronic knee pain   . COPD (chronic obstructive pulmonary disease) (HCC)   . Memory difficulty 2015  . Weakness of both legs   . 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 Pain management   . Frequent falls   . Dizziness   . 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 Prediabetes   . 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 Knee cartilage surgery Right 2007  . Appendectomy  1971  . Tonsillectomy  1971  . Cholecystectomy N/A 03/12/2016    Procedure: LAPAROSCOPIC CHOLECYSTECTOMY;  Surgeon: Ancil Linsey, MD;  Location: [facility];  Service: General;  Laterality: N/A;   Family History  Problem Relation Age of Onset  . Heart failure Father   . Diabetes Father   . Kidney failure Father    Social History  Substance Use Topics  . Smoking status: Former Smoker -- 0.50 packs/day for 10 years    Types: Cigarettes    Start date: 08/05/1970    Quit date: 03/03/2016  . Smokeless tobacco: Never Used     Comment: 04/30/15 less than 1 PPD  . Alcohol Use: 0.0 oz/week    0 Standard drinks or  equivalent per week     Comment: Very rare   OB History    Gravida Para Term Preterm AB TAB SAB Ectopic Multiple Living   2 2 2             Review of Systems  Musculoskeletal: Positive for back pain and arthralgias.  Neurological: Negative for headaches.      Allergies  Bee venom; Coconut flavor; Mushroom extract complex; Amitriptyline; Toradol; Tramadol; and Trazodone and nefazodone  Home Medications   Prior to Admission medications   Medication Sig Start Date End Date Taking? Authorizing Provider  albuterol (PROVENTIL HFA;VENTOLIN HFA) 108 (90 BASE) MCG/ACT inhaler Inhale 2 puffs into the lungs every 6 (six) hours as needed for wheezing. 06/21/13   Nimish Normajean Glasgow, MD  amLODipine (NORVASC) 2.5 MG tablet Take 2 tablets (5 mg total) by mouth daily. 06/30/15   Otis Brace, MD  aspirin EC 81 MG tablet Take 1 tablet (81 mg total) by mouth daily. 12/22/15   Henderson Cloud, MD  cephALEXin (KEFLEX) 500 MG capsule Take 1 capsule (500 mg total) by mouth 4 (four) times daily. 04/12/16   Bethann Berkshire, MD  cephALEXin (KEFLEX) 500 MG capsule Take 1 capsule (500 mg total) by mouth 4 (four) times daily. 04/12/16   Bethann Berkshire, MD  Cholecalciferol (VITAMIN D)  2000 units CAPS Take 1 capsule by mouth daily.    Historical Provider, MD  cyclobenzaprine (FLEXERIL) 10 MG tablet Take 10 mg by mouth 3 (three) times daily as needed for muscle spasms.    Historical Provider, MD  diazepam (VALIUM) 5 MG tablet Take 5 mg by mouth 3 (three) times daily as needed for anxiety.    Historical Provider, MD  dimenhyDRINATE (DRAMAMINE) 50 MG tablet Take 50 mg by mouth every 8 (eight) hours as needed for dizziness.    Historical Provider, MD  DULoxetine (CYMBALTA) 60 MG capsule Take 60 mg by mouth at bedtime.     Historical Provider, MD  EVZIO 0.4 MG/0.4ML SOAJ Inject 0.4 mLs into the muscle once as needed (for Anaphylaxis/allergic reaction).  02/19/15   Historical Provider, MD  gabapentin (NEURONTIN) 300 MG  capsule Take 300 mg by mouth 3 (three) times daily.    Historical Provider, MD  hydrOXYzine (VISTARIL) 25 MG capsule Take 25 mg by mouth at bedtime.    Historical Provider, MD  metoprolol succinate (TOPROL-XL) 50 MG 24 hr tablet Take 50 mg by mouth at bedtime. 12/14/15   Historical Provider, MD  omeprazole (PRILOSEC) 20 MG capsule Take 1 capsule (20 mg total) by mouth daily. 06/30/15   Otis Brace, MD  ondansetron (ZOFRAN ODT) 8 MG disintegrating tablet Take 1 tablet (8 mg total) by mouth every 8 (eight) hours as needed for nausea or vomiting. 03/13/16   Erick Blinks, MD  oxyCODONE-acetaminophen (PERCOCET) 10-325 MG tablet Take 1 tablet by mouth every 4 (four) hours as needed for pain. 03/13/16   Erick Blinks, MD  VOLTAREN 1 % GEL Apply 4 g topically every 8 (eight) hours.  02/06/15   Historical Provider, MD  zolpidem (AMBIEN) 10 MG tablet Take 1 tablet (10 mg total) by mouth at bedtime as needed for sleep. 03/31/16 04/30/16  Donnetta Hutching, MD   BP 130/58 mmHg  Pulse 74  Temp(Src) 97.8 F (36.6 C) (Oral)  Resp 18  Ht  (1.626 m)  Wt 98.431 kg  BMI 37.23 kg/m2  SpO2 96% Physical Exam CONSTITUTIONAL: Disheveled, crying HEAD: Normocephalic/atraumatic ENMT: Mucous membranes moist, poor dentition NECK: supple no meningeal signs SPINE/BACK:well healed scar to lumbar spine, no bruising/erythema/edema, tenderness noted CV: S1/S2 noted LUNGS: Lungs are clear to auscultation bilaterally ABDOMEN: soft, nontender NEURO: Pt is awake/alert/appropriate, moves all extremitiesx4.  No facial droop.   EXTREMITIES: pulses normal/equal, significant tenderness to bilateral knees.  No deformity.  She also has tenderness to ROM of both hips.  All other extremities/joints palpated/ranged and nontender SKIN: warm, color normal PSYCH: anxious and crying  ED Course  Procedures  Imaging Review Dg Pelvis 1-2 Views  04/16/2016  CLINICAL DATA:  Status post fall from a standing position today. Pelvic pain. Initial  encounter. EXAM: PELVIS - 1-2 VIEW COMPARISON:  CT abdomen and pelvis 03/10/2016. FINDINGS: There is no evidence of pelvic fracture or diastasis. No pelvic bone lesions are seen. IMPRESSION: Negative exam. Electronically Signed   By: Drusilla Kanner M.D.   On: 04/16/2016 15:45   Dg Knee Complete 4 Views Left  04/16/2016  CLINICAL DATA:  Fall from standing position.  Bilateral knee pain. EXAM: LEFT KNEE - COMPLETE 4+ VIEW COMPARISON:  01/01/2016 FINDINGS: Early spurring in the lateral and patellofemoral compartments. No acute bony abnormality. Specifically, no fracture, subluxation, or dislocation. Soft tissues are intact. No joint effusion. IMPRESSION: No acute bony abnormality.  Early degenerative changes. Electronically Signed   By: Charlett Nose M.D.  On: 04/16/2016 15:44   Dg Knee Complete 4 Views Right  04/16/2016  CLINICAL DATA:  Status post fall from a standing position this afternoon with bilateral knee injuries. Pain. Initial encounter. EXAM: RIGHT KNEE - COMPLETE 4+ VIEW COMPARISON:  CT scan and plain films the right knee 06/04/2015. FINDINGS: No acute bony or joint abnormality is identified. Tricompartmental degenerative change is seen. Chondrocalcinosis in the lateral compartment is noted. IMPRESSION: No acute abnormality. Osteoarthritis and chondrocalcinosis. Electronically Signed   By: Drusilla Kanner M.D.   On: 04/16/2016 15:44   I have personally reviewed and evaluated these images results as part of my medical decision-making.   3:24 PM Pt reports falling on knees from standing earlier today She appears to have pain out of proportion to exam as with any movement/palpation she has exquisite tenderness to both knees and hip Imaging ordered 3:54 PM Imaging negative Pt able to stand She reports she uses walker at home She admits pain in knees are chronic for years Given she fell from standing onto knees only, I doubt occult knee/pelvc injury She has pain management f/u  tomorrow  MDM   Final diagnoses:  Sprain of left knee, initial encounter  Sprain of right knee, initial encounter    Nursing notes including past medical history and social history reviewed and considered in documentation Narcotic database reviewed and considered in decision making     Zadie Rhine, MD 04/16/16 1555

## 2016-04-16 NOTE — ED Notes (Signed)
Patient not happy with lack of pain control in ED. Respirations even and unlabored. Skin warm/dry. Discharge instructions reviewed with patient at this time. Patient given opportunity to voice concerns/ask questions. Patient discharged at this time and left Emergency Department via wheelchair.

## 2016-04-16 NOTE — ED Notes (Signed)
Patient with fall from standing position to knees this afternoon. Patient alert/oriented. Denies hitting head or LOC. C/o bilateral knee pain and lower back pain (which is chronic in nature).

## 2016-04-17 ENCOUNTER — Emergency Department (HOSPITAL_COMMUNITY): Payer: Medicare Other

## 2016-04-17 ENCOUNTER — Emergency Department (HOSPITAL_COMMUNITY)
Admission: EM | Admit: 2016-04-17 | Discharge: 2016-04-17 | Disposition: A | Discharge status: Home / Self Care | Payer: Medicare Other | Attending: Emergency Medicine | Admitting: Emergency Medicine

## 2016-04-17 ENCOUNTER — Encounter (HOSPITAL_COMMUNITY): Payer: Self-pay

## 2016-04-17 DIAGNOSIS — J449 Chronic obstructive pulmonary disease, unspecified: Secondary | ICD-10-CM | POA: Diagnosis not present

## 2016-04-17 DIAGNOSIS — Z79891 Long term (current) use of opiate analgesic: Secondary | ICD-10-CM | POA: Diagnosis not present

## 2016-04-17 DIAGNOSIS — M545 Low back pain: Secondary | ICD-10-CM | POA: Insufficient documentation

## 2016-04-17 DIAGNOSIS — Y939 Activity, unspecified: Secondary | ICD-10-CM | POA: Diagnosis not present

## 2016-04-17 DIAGNOSIS — Z79899 Other long term (current) drug therapy: Secondary | ICD-10-CM | POA: Diagnosis not present

## 2016-04-17 DIAGNOSIS — I10 Essential (primary) hypertension: Secondary | ICD-10-CM | POA: Diagnosis not present

## 2016-04-17 DIAGNOSIS — W01198A Fall on same level from slipping, tripping and stumbling with subsequent striking against other object, initial encounter: Secondary | ICD-10-CM | POA: Insufficient documentation

## 2016-04-17 DIAGNOSIS — M1990 Unspecified osteoarthritis, unspecified site: Secondary | ICD-10-CM | POA: Insufficient documentation

## 2016-04-17 DIAGNOSIS — S40011A Contusion of right shoulder, initial encounter: Secondary | ICD-10-CM | POA: Diagnosis not present

## 2016-04-17 DIAGNOSIS — Z7982 Long term (current) use of aspirin: Secondary | ICD-10-CM | POA: Insufficient documentation

## 2016-04-17 DIAGNOSIS — S0990XA Unspecified injury of head, initial encounter: Secondary | ICD-10-CM

## 2016-04-17 DIAGNOSIS — Z87891 Personal history of nicotine dependence: Secondary | ICD-10-CM | POA: Diagnosis not present

## 2016-04-17 DIAGNOSIS — F329 Major depressive disorder, single episode, unspecified: Secondary | ICD-10-CM | POA: Diagnosis not present

## 2016-04-17 DIAGNOSIS — M25511 Pain in right shoulder: Secondary | ICD-10-CM | POA: Diagnosis present

## 2016-04-17 DIAGNOSIS — Y999 Unspecified external cause status: Secondary | ICD-10-CM | POA: Diagnosis not present

## 2016-04-17 DIAGNOSIS — Y929 Unspecified place or not applicable: Secondary | ICD-10-CM | POA: Insufficient documentation

## 2016-04-17 LAB — URINALYSIS, ROUTINE W REFLEX MICROSCOPIC
Bilirubin Urine: NEGATIVE
GLUCOSE, UA: NEGATIVE mg/dL
KETONES UR: NEGATIVE mg/dL
LEUKOCYTES UA: NEGATIVE
Nitrite: NEGATIVE
Specific Gravity, Urine: 1.025 (ref 1.005–1.030)
pH: 5.5 (ref 5.0–8.0)

## 2016-04-17 LAB — COMPREHENSIVE METABOLIC PANEL
ALT: 19 U/L (ref 14–54)
ANION GAP: 7 (ref 5–15)
AST: 23 U/L (ref 15–41)
Albumin: 3.9 g/dL (ref 3.5–5.0)
Alkaline Phosphatase: 91 U/L (ref 38–126)
BILIRUBIN TOTAL: 0.7 mg/dL (ref 0.3–1.2)
BUN: 12 mg/dL (ref 6–20)
CO2: 28 mmol/L (ref 22–32)
Calcium: 9.1 mg/dL (ref 8.9–10.3)
Chloride: 105 mmol/L (ref 101–111)
Creatinine, Ser: 0.63 mg/dL (ref 0.44–1.00)
GFR calc Af Amer: >60 mL/min (ref >60)
Glucose, Bld: 104 mg/dL — ABNORMAL HIGH (ref 65–99)
POTASSIUM: 3.4 mmol/L — AB (ref 3.5–5.1)
Sodium: 140 mmol/L (ref 135–145)
TOTAL PROTEIN: 7.1 g/dL (ref 6.5–8.1)

## 2016-04-17 LAB — CBC WITH DIFFERENTIAL/PLATELET
Basophils Absolute: 0 10*3/uL (ref 0.0–0.1)
Basophils Relative: 0 %
Eosinophils Absolute: 0.2 10*3/uL (ref 0.0–0.7)
Eosinophils Relative: 2 %
HEMATOCRIT: 49 % — AB (ref 36.0–46.0)
Hemoglobin: 16.2 g/dL — ABNORMAL HIGH (ref 12.0–15.0)
LYMPHS PCT: 53 %
Lymphs Abs: 5.9 10*3/uL — ABNORMAL HIGH (ref 0.7–4.0)
MCH: 30.4 pg (ref 26.0–34.0)
MCHC: 33.1 g/dL (ref 30.0–36.0)
MCV: 91.9 fL (ref 78.0–100.0)
MONO ABS: 0.8 10*3/uL (ref 0.1–1.0)
MONOS PCT: 7 %
NEUTROS ABS: 4.2 10*3/uL (ref 1.7–7.7)
Neutrophils Relative %: 38 %
Platelets: 244 10*3/uL (ref 150–400)
RBC: 5.33 MIL/uL — ABNORMAL HIGH (ref 3.87–5.11)
RDW: 13.7 % (ref 11.5–15.5)
WBC: 11.2 10*3/uL — ABNORMAL HIGH (ref 4.0–10.5)

## 2016-04-17 LAB — URINE MICROSCOPIC-ADD ON

## 2016-04-17 NOTE — ED Notes (Signed)
Pt brought in by EMS. Reports pt was ambulating to BR. Surface uneven and per EMS found laying on top of walker, mainly on right side of body. States she hit the right side of head, right arm and right leg. Pt was seen in ED yesterday due to fall on knnes

## 2016-04-17 NOTE — ED Provider Notes (Signed)
CSN: [id number]     Arrival date & time 04/17/16  1155 History  By signing my name below, I, Jasmyn B. Alexander, attest that this documentation has been prepared under the direction and in the presence of Loren Racer, MD.  Electronically Signed: Gillis Ends. Lyn Hollingshead, ED Scribe. 04/17/2016. 12:49 PM.    Chief Complaint  Patient presents with  . Fall   The history is provided by the patient. No language interpreter was used.    HPI Comments: BIRKLEY VARAS is a 65 y.o. female brought in by ambulance, with PMHx of HTN, COPD, and frequent falls who presents to the Emergency Department complaining of fall from standing this afternoon. States she tripped over a area of uneven floor and struck the right side of her head and right shoulder. Questionable loss of consciousness. States she saw bright lights for several seconds. She complains of pain to her scalp and right shoulder. She also has chronic low back and bilateral knee pain which is unchanged. No new neck pain. No new focal weakness or numbness. She reports taking her Gabapentin, Vitamin D, Aspirin, Amlodipine and Metoprolol this morning PTA but had not yet taken her chronic pain medication. Pt was recently seen in [facility] yesterday on 04/16/16 for similar complaints.  Past Medical History  Diagnosis Date  . Arthritis   . Bronchitis   . Hypertension   . Chronic back pain   . Chronic knee pain   . COPD (chronic obstructive pulmonary disease) (HCC)   . Memory difficulty 2015  . Weakness of both legs   . 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 Pain management   . Frequent falls   . Dizziness   . 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 Prediabetes   . 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 Knee cartilage surgery Right 2007  . Appendectomy  1971  . Tonsillectomy  1971  . Cholecystectomy  N/A 03/12/2016    Procedure: LAPAROSCOPIC CHOLECYSTECTOMY;  Surgeon: Ancil Linsey, MD;  Location: [facility];  Service: General;  Laterality: N/A;   Family History  Problem Relation Age of Onset  . Heart failure Father   . Diabetes Father   . Kidney failure Father    Social History  Substance Use Topics  . Smoking status: Former Smoker -- 0.50 packs/day for 10 years    Types: Cigarettes    Start date: 08/05/1970    Quit date: 03/03/2016  . Smokeless tobacco: Never Used     Comment: 04/30/15 less than 1 PPD  . Alcohol Use: 0.0 oz/week    0 Standard drinks or equivalent per week     Comment: Very rare   OB History    Gravida Para Term Preterm AB TAB SAB Ectopic Multiple Living   2 2 2             Review of Systems  Constitutional: Negative for fever and chills.  Eyes: Negative for visual disturbance.  Respiratory: Negative for cough and shortness of breath.   Cardiovascular: Negative for chest pain.  Gastrointestinal: Negative for abdominal pain.  Genitourinary: Negative for dysuria and frequency.  Musculoskeletal: Positive for myalgias, back pain and arthralgias. Negative for neck pain.  Skin: Negative for rash and wound.  Neurological: Positive for syncope and headaches. Negative for dizziness, weakness, light-headedness and numbness.  All other systems reviewed and are negative.  Allergies  Bee  venom; Coconut flavor; Mushroom extract complex; Amitriptyline; Toradol; Tramadol; and Trazodone and nefazodone  Home Medications   Prior to Admission medications   Medication Sig Start Date End Date Taking? Authorizing Provider  albuterol (PROVENTIL HFA;VENTOLIN HFA) 108 (90 BASE) MCG/ACT inhaler Inhale 2 puffs into the lungs every 6 (six) hours as needed for wheezing. 06/21/13  Yes Nimish Normajean Glasgow, MD  amLODipine (NORVASC) 2.5 MG tablet Take 2 tablets (5 mg total) by mouth daily. 06/30/15  Yes Marjan Rabbani, MD  aspirin EC 81 MG tablet Take 1 tablet (81 mg total) by mouth daily.  12/22/15  Yes Henderson Cloud, MD  Cholecalciferol (VITAMIN D) 2000 units CAPS Take 1 capsule by mouth daily.   Yes Historical Provider, MD  cyclobenzaprine (FLEXERIL) 10 MG tablet Take 10 mg by mouth 3 (three) times daily as needed for muscle spasms.   Yes Historical Provider, MD  diazepam (VALIUM) 5 MG tablet Take 5 mg by mouth 3 (three) times daily as needed for anxiety.   Yes Historical Provider, MD  dimenhyDRINATE (DRAMAMINE) 50 MG tablet Take 50 mg by mouth every 8 (eight) hours as needed for dizziness.   Yes Historical Provider, MD  DULoxetine (CYMBALTA) 60 MG capsule Take 60 mg by mouth daily.    Yes Historical Provider, MD  gabapentin (NEURONTIN) 300 MG capsule Take 300 mg by mouth 3 (three) times daily.   Yes Historical Provider, MD  hydrOXYzine (VISTARIL) 25 MG capsule Take 25 mg by mouth at bedtime.   Yes Historical Provider, MD  metoprolol succinate (TOPROL-XL) 50 MG 24 hr tablet Take 50 mg by mouth at bedtime. 12/14/15  Yes Historical Provider, MD  omeprazole (PRILOSEC) 20 MG capsule Take 1 capsule (20 mg total) by mouth daily. 06/30/15  Yes Marjan Rabbani, MD  ondansetron (ZOFRAN ODT) 8 MG disintegrating tablet Take 1 tablet (8 mg total) by mouth every 8 (eight) hours as needed for nausea or vomiting. 03/13/16  Yes Erick Blinks, MD  oxyCODONE-acetaminophen (PERCOCET) 10-325 MG tablet Take 1 tablet by mouth every 4 (four) hours as needed for pain. 03/13/16  Yes Erick Blinks, MD  VOLTAREN 1 % GEL Apply 4 g topically every 8 (eight) hours.  02/06/15  Yes Historical Provider, MD  zolpidem (AMBIEN) 10 MG tablet Take 1 tablet (10 mg total) by mouth at bedtime as needed for sleep. 03/31/16 04/30/16 Yes Donnetta Hutching, MD  cephALEXin (KEFLEX) 500 MG capsule Take 1 capsule (500 mg total) by mouth 4 (four) times daily. 04/12/16   Bethann Berkshire, MD  cephALEXin (KEFLEX) 500 MG capsule Take 1 capsule (500 mg total) by mouth 4 (four) times daily. 04/12/16   Bethann Berkshire, MD  EVZIO 0.4 MG/0.4ML SOAJ  Inject 0.4 mLs into the muscle once as needed (for Anaphylaxis/allergic reaction).  02/19/15   Historical Provider, MD   BP 120/73 mmHg  Pulse 52  Temp(Src) 97.8 F (36.6 C) (Oral)  Resp 13  Ht 5\' 4"  (1.626 m)  Wt 210 lb (95.255 kg)  BMI 36.03 kg/m2  SpO2 94% Physical Exam  Constitutional: She is oriented to person, place, and time. She appears well-developed and well-nourished. No distress.  Patient is on her tablet and in no distress.  HENT:  Head: Normocephalic and atraumatic.  Mouth/Throat: Oropharynx is clear and moist. No oropharyngeal exudate.  Patient has some tenderness to palpation over the right temporal scalp but there is no evidence of hematoma, contusion or abrasion. Midface is stable.  Eyes: EOM are normal. Pupils are equal, round, and  reactive to light.  Neck: Normal range of motion. Neck supple.  No posterior midline cervical tenderness to palpation.  Cardiovascular: Normal rate and regular rhythm.  Exam reveals no gallop and no friction rub.   No murmur heard. Pulmonary/Chest: Effort normal and breath sounds normal. No respiratory distress. She has no wheezes. She has no rales. She exhibits no tenderness.  Abdominal: Soft. Bowel sounds are normal. She exhibits no distension and no mass. There is no tenderness. There is no rebound and no guarding.  Musculoskeletal: Normal range of motion. She exhibits tenderness. She exhibits no edema.  Patient has diffuse right shoulder tenderness and pain with range of motion. There is no obvious deformity or evidence of injury. Full range of motion of the right elbow and wrist without pain. Pelvis is stable. No midline thoracic tenderness. Patient states she does have some midline lumbar tenderness but this is unchanged from her baseline. Full range of motion of bilateral knees but states they are still painful from her fall yesterday. No warmth, effusion or deformity. Distal pulses are equal and intact.  Neurological: She is alert and  oriented to person, place, and time.  Skin: Skin is warm and dry. No rash noted. No erythema.  Psychiatric: She has a normal mood and affect. Her behavior is normal.  Nursing note and vitals reviewed.   ED Course  Procedures (including critical care time) DIAGNOSTIC STUDIES: Oxygen Saturation is 94% on RA, adequate by my interpretation.    COORDINATION OF CARE: 12:47 PM-Discussed treatment plan which includes CBC, CMP, UA, CT of head, and X-ray of right shoulder with pt at bedside and pt agreed to plan.   Labs Review Labs Reviewed  CBC WITH DIFFERENTIAL/PLATELET - Abnormal; Notable for the following:    WBC 11.2 (*)    RBC 5.33 (*)    Hemoglobin 16.2 (*)    HCT 49.0 (*)    Lymphs Abs 5.9 (*)    All other components within normal limits  COMPREHENSIVE METABOLIC PANEL - Abnormal; Notable for the following:    Potassium 3.4 (*)    Glucose, Bld 104 (*)    All other components within normal limits  URINALYSIS, ROUTINE W REFLEX MICROSCOPIC (NOT AT [facility]) - Abnormal; Notable for the following:    Hgb urine dipstick TRACE (*)    Protein, ur TRACE (*)    All other components within normal limits  URINE MICROSCOPIC-ADD ON - Abnormal; Notable for the following:    Squamous Epithelial / LPF 0-5 (*)    Bacteria, UA FEW (*)    All other components within normal limits    Imaging Review Dg Pelvis 1-2 Views  04/16/2016  CLINICAL DATA:  Status post fall from a standing position today. Pelvic pain. Initial encounter. EXAM: PELVIS - 1-2 VIEW COMPARISON:  CT abdomen and pelvis 03/10/2016. FINDINGS: There is no evidence of pelvic fracture or diastasis. No pelvic bone lesions are seen. IMPRESSION: Negative exam. Electronically Signed   By: Drusilla Kanner M.D.   On: 04/16/2016 15:45   Dg Shoulder Right  04/17/2016  CLINICAL DATA:  Right shoulder pain after fall down steps. Initial encounter. EXAM: RIGHT SHOULDER - 2+ VIEW COMPARISON:  05/03/2015 FINDINGS: There is no evidence of fracture or  dislocation. Mild superior spurring from the [facility] joint, stable from comparison. No evidence of chest wall injury. IMPRESSION: No acute finding. Electronically Signed   By: Marnee Spring M.D.   On: 04/17/2016 13:57   Ct Head Wo Contrast  04/17/2016  CLINICAL  DATA:  Multiple falls last couple of days. EXAM: CT HEAD WITHOUT CONTRAST TECHNIQUE: Contiguous axial images were obtained from the base of the skull through the vertex without intravenous contrast. COMPARISON:  04/12/2016 FINDINGS: Brain: No evidence of acute infarction, hemorrhage, extra-axial collection, ventriculomegaly, or mass effect. Vascular: No hyperdense vessel or unexpected calcification. Skull: Negative for fracture or focal lesion. Sinuses/Orbits: Small air-fluid level in the left maxillary sinus. Small air-fluid level in the left sphenoid sinus. Visualized orbits are unremarkable. Other: None. IMPRESSION: No acute intracranial pathology. Electronically Signed   By: Elige Ko   On: 04/17/2016 13:45   Dg Knee Complete 4 Views Left  04/16/2016  CLINICAL DATA:  Fall from standing position.  Bilateral knee pain. EXAM: LEFT KNEE - COMPLETE 4+ VIEW COMPARISON:  01/01/2016 FINDINGS: Early spurring in the lateral and patellofemoral compartments. No acute bony abnormality. Specifically, no fracture, subluxation, or dislocation. Soft tissues are intact. No joint effusion. IMPRESSION: No acute bony abnormality.  Early degenerative changes. Electronically Signed   By: Charlett Nose M.D.   On: 04/16/2016 15:44   Dg Knee Complete 4 Views Right  04/16/2016  CLINICAL DATA:  Status post fall from a standing position this afternoon with bilateral knee injuries. Pain. Initial encounter. EXAM: RIGHT KNEE - COMPLETE 4+ VIEW COMPARISON:  CT scan and plain films the right knee 06/04/2015. FINDINGS: No acute bony or joint abnormality is identified. Tricompartmental degenerative change is seen. Chondrocalcinosis in the lateral compartment is noted. IMPRESSION: No  acute abnormality. Osteoarthritis and chondrocalcinosis. Electronically Signed   By: Drusilla Kanner M.D.   On: 04/16/2016 15:44   I have personally reviewed and evaluated these images and lab results as part of my medical decision-making.   EKG Interpretation   Date/Time:  Thursday April 17 2016 12:03:06 EDT Ventricular Rate:  77 PR Interval:    QRS Duration: 82 QT Interval:  399 QTC Calculation: 452 R Axis:   61 Text Interpretation:  Sinus rhythm Confirmed by Ranae Palms  MD, Mercedez Boule  ([id number]) on 04/17/2016 3:50:07 PM      MDM   Final diagnoses:  Shoulder contusion, right, initial encounter  Closed head injury, initial encounter    She with history of chronic pain and frequent falls with multiple his emergency department presents after fall from standing. There is no obvious injury on exam. Questionable loss of consciousness. CT head to rule out any intracranial abnormality though suspicion is very low. Also get x-ray of the right shoulder. Do not believe that the knees need to be reimaged since x-rays were taken yesterday and has been no new symptoms associated with this. Patient did have a urinary tract infection one week ago when she was seen in the emergency department and started on antibiotics. We'll recheck urine and basic labs.  No evidence of injury on imaging. No urinary tract infection present. Labs appear stable. We'll discharge home with head injury precautions.  Loren Racer, MD 04/17/16 1550

## 2016-04-17 NOTE — Discharge Instructions (Signed)
Head Injury, Adult °You have a head injury. Headaches and throwing up (vomiting) are common after a head injury. It should be easy to wake up from sleeping. Sometimes you must stay in the hospital. Most problems happen within the first 24 hours. Side effects may occur up to 7-10 days after the injury.  °WHAT ARE THE TYPES OF HEAD INJURIES? °Head injuries can be as minor as a bump. Some head injuries can be more severe. More severe head injuries include: °· A jarring injury to the brain (concussion). °· A bruise of the brain (contusion). This mean there is bleeding in the brain that can cause swelling. °· A cracked skull (skull fracture). °· Bleeding in the brain that collects, clots, and forms a bump (hematoma). °WHEN SHOULD I GET HELP RIGHT AWAY?  °· You are confused or sleepy. °· You cannot be woken up. °· You feel sick to your stomach (nauseous) or keep throwing up (vomiting). °· Your dizziness or unsteadiness is getting worse. °· You have very bad, lasting headaches that are not helped by medicine. Take medicines only as told by your doctor. °· You cannot use your arms or legs like normal. °· You cannot walk. °· You notice changes in the black spots in the center of the colored part of your eye (pupil). °· You have clear or bloody fluid coming from your nose or ears. °· You have trouble seeing. °During the next 24 hours after the injury, you must stay with someone who can watch you. This person should get help right away (call 911 in the [place] if you start to shake and are not able to control it (have seizures), you pass out, or you are unable to wake up. °HOW CAN I PREVENT A HEAD INJURY IN THE FUTURE? °· Wear seat belts. °· Wear a helmet while bike riding and playing sports like football. °· Stay away from dangerous activities around the house. °WHEN CAN I RETURN TO NORMAL ACTIVITIES AND ATHLETICS? °See your doctor before doing these activities. You should not do normal activities or play contact sports until 1  week after the following symptoms have stopped: °· Headache that does not go away. °· Dizziness. °· Poor attention. °· Confusion. °· Memory problems. °· Sickness to your stomach or throwing up. °· Tiredness. °· Fussiness. °· Bothered by bright lights or loud noises. °· Anxiousness or depression. °· Restless sleep. °MAKE SURE YOU:  °· Understand these instructions. °· Will watch your condition. °· Will get help right away if you are not doing well or get worse. °  °This information is not intended to replace advice given to you by your health care provider. Make sure you discuss any questions you have with your health care provider. °  °Document Released: 09/11/2008 Document Revised: 10/20/2014 Document Reviewed: 06/06/2013 °Elsevier Interactive Patient Education ©2016 [employer] °Shoulder Pain °The shoulder is the joint that connects your arms to your body. The bones that form the shoulder joint include the upper arm bone (humerus), the shoulder blade (scapula), and the collarbone (clavicle). The top of the humerus is shaped like a ball and fits into a rather flat socket on the scapula (glenoid cavity). A combination of muscles and strong, fibrous tissues that connect muscles to bones (tendons) support your shoulder joint and hold the ball in the socket. Small, fluid-filled sacs (bursae) are located in different areas of the joint. They act as cushions between the bones and the overlying soft tissues and help reduce friction between the gliding   tendons and the bone as you move your arm. Your shoulder joint allows a wide range of motion in your arm. This range of motion allows you to do things like scratch your back or throw a ball. However, this range of motion also makes your shoulder more prone to pain from overuse and injury. °Causes of shoulder pain can originate from both injury and overuse and usually can be grouped in the following four categories: °· Redness, swelling, and pain (inflammation) of the  tendon (tendinitis) or the bursae (bursitis). °· Instability, such as a dislocation of the joint. °· Inflammation of the joint (arthritis). °· Broken bone (fracture). °HOME CARE INSTRUCTIONS  °· Apply ice to the sore area. °¨ Put ice in a plastic bag. °¨ Place a towel between your skin and the bag. °¨ Leave the ice on for 15-20 minutes, 3-4 times per day for the first 2 days, or as directed by your health care provider. °· Stop using cold packs if they do not help with the pain. °· If you have a shoulder sling or immobilizer, wear it as long as your caregiver instructs. Only remove it to shower or bathe. Move your arm as little as possible, but keep your hand moving to prevent swelling. °· Squeeze a soft ball or foam pad as much as possible to help prevent swelling. °· Only take over-the-counter or prescription medicines for pain, discomfort, or fever as directed by your caregiver. °SEEK MEDICAL CARE IF:  °· Your shoulder pain increases, or new pain develops in your arm, hand, or fingers. °· Your hand or fingers become cold and numb. °· Your pain is not relieved with medicines. °SEEK IMMEDIATE MEDICAL CARE IF:  °· Your arm, hand, or fingers are numb or tingling. °· Your arm, hand, or fingers are significantly swollen or turn white or blue. °MAKE SURE YOU:  °· Understand these instructions. °· Will watch your condition. °· Will get help right away if you are not doing well or get worse. °  °This information is not intended to replace advice given to you by your health care provider. Make sure you discuss any questions you have with your health care provider. °  °Document Released: 07/09/2005 Document Revised: 10/20/2014 Document Reviewed: 01/22/2015 °Elsevier Interactive Patient Education ©2016 Elsevier Inc. ° °

## 2016-04-24 ENCOUNTER — Other Ambulatory Visit: Payer: Self-pay | Admitting: Neurosurgery

## 2016-05-01 NOTE — Pre-Procedure Instructions (Addendum)
    Carla [name]  05/01/2016    Your procedure is scheduled on Friday, July 28.  Report to [facility] Admitting at 6:00 A.M.                 Your surgery or procedure is scheduled for 8:00 AM   Call this number if you have problems the morning of surgery:[phone]                For any other questions, please call [phone], Monday - Friday 8 AM - 4 PM.   Remember:  Do not eat food or drink liquids after midnight Thursday, July 27.  Take these medicines the morning of surgery with A SIP OF WATER :amLODipine (NORVASC), DULoxetine (CYMBALTA), gabapentin (NEURONTIN),omeprazole (PRILOSEC).               Take if needed and you tolerate on an empty stomach :oxyCODONE-acetaminophen (PERCOCET), diazepam (VALIUM) or Flexeril, Zofran.   Do not wear jewelry, make-up or nail polish.  Do not wear lotions, powders, or perfumes.    Do not shave 48 hours prior to surgery.    Do not bring valuables to the hospital.  [facility] is not responsible for any belongings or valuables.  Contacts, dentures or bridgework may not be worn into surgery.  Leave your suitcase in the car.  After surgery it may be brought to your room.  For patients admitted to the hospital, discharge time will be determined by your treatment team.  Special instructions:  Review  [place] - Preparing For Surgery.  Please read over the following fact sheets that you were given: [facility]- Preparing For Surgery and Patient Instructions for Mupirocin Application, Cough and Deep Breath.

## 2016-05-02 ENCOUNTER — Other Ambulatory Visit: Payer: Self-pay

## 2016-05-02 ENCOUNTER — Encounter (HOSPITAL_COMMUNITY): Payer: Self-pay

## 2016-05-02 ENCOUNTER — Encounter (HOSPITAL_COMMUNITY)
Admission: RE | Admit: 2016-05-02 | Discharge: 2016-05-02 | Disposition: A | Discharge status: Home / Self Care | Payer: Medicare Other | Source: Ambulatory Visit | Attending: Neurosurgery | Admitting: Neurosurgery

## 2016-05-02 DIAGNOSIS — Z01818 Encounter for other preprocedural examination: Secondary | ICD-10-CM | POA: Diagnosis not present

## 2016-05-02 DIAGNOSIS — M4310 Spondylolisthesis, site unspecified: Secondary | ICD-10-CM | POA: Insufficient documentation

## 2016-05-02 DIAGNOSIS — I491 Atrial premature depolarization: Secondary | ICD-10-CM | POA: Diagnosis not present

## 2016-05-02 DIAGNOSIS — R001 Bradycardia, unspecified: Secondary | ICD-10-CM | POA: Insufficient documentation

## 2016-05-02 DIAGNOSIS — I10 Essential (primary) hypertension: Secondary | ICD-10-CM | POA: Diagnosis not present

## 2016-05-02 DIAGNOSIS — Z01812 Encounter for preprocedural laboratory examination: Secondary | ICD-10-CM | POA: Diagnosis not present

## 2016-05-02 DIAGNOSIS — Z0183 Encounter for blood typing: Secondary | ICD-10-CM | POA: Diagnosis not present

## 2016-05-02 HISTORY — DX: Polyneuropathy, unspecified: G62.9

## 2016-05-02 HISTORY — DX: Reserved for inherently not codable concepts without codable children: IMO0001

## 2016-05-02 HISTORY — DX: Gastro-esophageal reflux disease without esophagitis: K21.9

## 2016-05-02 HISTORY — DX: Anxiety disorder, unspecified: F41.9

## 2016-05-02 LAB — CBC WITH DIFFERENTIAL/PLATELET
BASOS PCT: 0 %
Basophils Absolute: 0 10*3/uL (ref 0.0–0.1)
Eosinophils Absolute: 0.3 10*3/uL (ref 0.0–0.7)
Eosinophils Relative: 3 %
HEMATOCRIT: 46.5 % — AB (ref 36.0–46.0)
HEMOGLOBIN: 14.8 g/dL (ref 12.0–15.0)
LYMPHS ABS: 4.1 10*3/uL — AB (ref 0.7–4.0)
Lymphocytes Relative: 42 %
MCH: 29.7 pg (ref 26.0–34.0)
MCHC: 31.8 g/dL (ref 30.0–36.0)
MCV: 93.4 fL (ref 78.0–100.0)
MONO ABS: 0.9 10*3/uL (ref 0.1–1.0)
MONOS PCT: 9 %
NEUTROS ABS: 4.4 10*3/uL (ref 1.7–7.7)
NEUTROS PCT: 46 %
Platelets: 274 10*3/uL (ref 150–400)
RBC: 4.98 MIL/uL (ref 3.87–5.11)
RDW: 13.7 % (ref 11.5–15.5)
WBC: 9.7 10*3/uL (ref 4.0–10.5)

## 2016-05-02 LAB — BASIC METABOLIC PANEL
ANION GAP: 6 (ref 5–15)
BUN: 13 mg/dL (ref 6–20)
CALCIUM: 9.4 mg/dL (ref 8.9–10.3)
CHLORIDE: 103 mmol/L (ref 101–111)
CO2: 28 mmol/L (ref 22–32)
Creatinine, Ser: 0.74 mg/dL (ref 0.44–1.00)
GFR calc Af Amer: >60 mL/min (ref >60)
GFR calc non Af Amer: >60 mL/min (ref >60)
GLUCOSE: 98 mg/dL (ref 65–99)
Potassium: 4 mmol/L (ref 3.5–5.1)
Sodium: 137 mmol/L (ref 135–145)

## 2016-05-02 LAB — ABO/RH: ABO/RH(D): A POS

## 2016-05-02 LAB — SURGICAL PCR SCREEN
MRSA, PCR: NEGATIVE
Staphylococcus aureus: NEGATIVE

## 2016-05-02 LAB — TYPE AND SCREEN
ABO/RH(D): A POS
ANTIBODY SCREEN: NEGATIVE
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
## 2016-05-06 NOTE — Progress Notes (Signed)
Anesthesia Chart Review: Patient is a 65 year old female scheduled for L4-5 PLIF on 05/09/16 by Dr. Jordan Likes.  History includes smoking, HTN, COPD, memory difficulty, depression with pseudodementia, GERD, neuropathy, pre-diabetes, exertional dyspnea, anxiety, tonsillectomy, appendectomy, cholecystectomy 03/12/16, chronic pain.   PCP is Dr. [name].  Cardiologist is Dr. Purvis Sheffield. See by Theodore Demark, PA-C on 12/28/15 following [facility] admission for fall with elevated troponin of 0.24. She had a low risk stress test but EF 41% (although appeared to have normal systolic function). Consideration of echo to further evaluate EF recommended. She saw Dr. Purvis Sheffield in follow-up on 01/16/16. With previous echo less than a year ago, he did not recommend additional testing. He did recommend continuing ASA and metoprolol and adding statin (but would defer to PCP) given DM history. He recommended PRN cardiology follow-up.  She did report at PAT that she gets occasional heart racing when she is in pain.   Meds include albuterol, amlodipine, ASA (on hold), Flexeril, Valium, Cymbalta, Evzio (naloxone) PRN, Neurontin, Toprol XL, Prilosec, Percocet.  05/02/16 EKG: SB at 50 bpm, PACs. HR 73 with PAT vitals.  01/09/16 Nuclear stress test:Study Result   There was no ST segment deviation noted during stress.  This is a low risk study. No ischemia or infarction.  Nuclear stress EF: 41%. However, there appears to be normal systolic function. Would recommend echocardiography for a more accurate assessment of LVEF.   06/29/15 Echo: Study Conclusions - Left ventricle: Technically very limited study. Contrast images   are good. LV function is normal. The cavity size was normal. Wall   thickness was increased in a pattern of mild LVH. The estimated   ejection fraction was 60%. Regional wall motion abnormalities   cannot be excluded. - Right ventricle: RV function probably normal. Poorly visualized.  03/10/16 CXR: IMPRESSION: No  active cardiopulmonary disease.  Preoperative labs noted.   Patient with cardiology evaluation and testing earlier this year. She tolerated cholecystectomy a few months ago. If no acute changes then I would anticipate that she could proceed as planned.  Velna Ochs [facility]/Anesthesiology Phone [phone] 05/06/2016 5:16 PM

## 2016-05-08 MED ORDER — DEXAMETHASONE SODIUM PHOSPHATE 10 MG/ML IJ SOLN
10.0000 mg | INTRAMUSCULAR | Status: AC
  Administered 2016-05-09: 10 mg via INTRAVENOUS
  Filled 2016-05-08 (×2): qty 1

## 2016-05-08 MED ORDER — CEFAZOLIN SODIUM-DEXTROSE 2-4 GM/100ML-% IV SOLN
2.0000 g | INTRAVENOUS | Status: AC
  Administered 2016-05-09: 2 g via INTRAVENOUS
  Filled 2016-05-08 (×2): qty 100

## 2016-05-09 ENCOUNTER — Inpatient Hospital Stay (HOSPITAL_COMMUNITY)
Admission: RE | Admit: 2016-05-09 | Discharge: 2016-05-13 | DRG: 460 | Disposition: A | Discharge status: Skilled Nursing Facility | Payer: Medicare Other | Source: Ambulatory Visit | Attending: Neurosurgery | Admitting: Neurosurgery

## 2016-05-09 ENCOUNTER — Inpatient Hospital Stay (HOSPITAL_COMMUNITY): Payer: Medicare Other | Admitting: Vascular Surgery

## 2016-05-09 ENCOUNTER — Inpatient Hospital Stay (HOSPITAL_COMMUNITY): Payer: Medicare Other | Admitting: Anesthesiology

## 2016-05-09 ENCOUNTER — Inpatient Hospital Stay (HOSPITAL_COMMUNITY): Payer: Medicare Other

## 2016-05-09 ENCOUNTER — Encounter (HOSPITAL_COMMUNITY): Payer: Self-pay | Admitting: *Deleted

## 2016-05-09 ENCOUNTER — Encounter (HOSPITAL_COMMUNITY)
Admission: RE | Disposition: A | Discharge status: Skilled Nursing Facility | Payer: Self-pay | Source: Ambulatory Visit | Attending: Neurosurgery

## 2016-05-09 DIAGNOSIS — G629 Polyneuropathy, unspecified: Secondary | ICD-10-CM | POA: Diagnosis present

## 2016-05-09 DIAGNOSIS — M4316 Spondylolisthesis, lumbar region: Principal | ICD-10-CM | POA: Diagnosis present

## 2016-05-09 DIAGNOSIS — Z6835 Body mass index (BMI) 35.0-35.9, adult: Secondary | ICD-10-CM | POA: Diagnosis not present

## 2016-05-09 DIAGNOSIS — Z79899 Other long term (current) drug therapy: Secondary | ICD-10-CM

## 2016-05-09 DIAGNOSIS — F17210 Nicotine dependence, cigarettes, uncomplicated: Secondary | ICD-10-CM | POA: Diagnosis present

## 2016-05-09 DIAGNOSIS — Z419 Encounter for procedure for purposes other than remedying health state, unspecified: Secondary | ICD-10-CM

## 2016-05-09 DIAGNOSIS — F329 Major depressive disorder, single episode, unspecified: Secondary | ICD-10-CM | POA: Diagnosis present

## 2016-05-09 DIAGNOSIS — M545 Low back pain: Secondary | ICD-10-CM | POA: Diagnosis present

## 2016-05-09 DIAGNOSIS — F419 Anxiety disorder, unspecified: Secondary | ICD-10-CM | POA: Diagnosis present

## 2016-05-09 DIAGNOSIS — J449 Chronic obstructive pulmonary disease, unspecified: Secondary | ICD-10-CM | POA: Diagnosis present

## 2016-05-09 DIAGNOSIS — Z7982 Long term (current) use of aspirin: Secondary | ICD-10-CM

## 2016-05-09 DIAGNOSIS — M4310 Spondylolisthesis, site unspecified: Secondary | ICD-10-CM | POA: Diagnosis present

## 2016-05-09 DIAGNOSIS — E669 Obesity, unspecified: Secondary | ICD-10-CM | POA: Diagnosis present

## 2016-05-09 DIAGNOSIS — I10 Essential (primary) hypertension: Secondary | ICD-10-CM | POA: Diagnosis present

## 2016-05-09 DIAGNOSIS — R7303 Prediabetes: Secondary | ICD-10-CM | POA: Diagnosis present

## 2016-05-09 DIAGNOSIS — K219 Gastro-esophageal reflux disease without esophagitis: Secondary | ICD-10-CM | POA: Diagnosis present

## 2016-05-09 DIAGNOSIS — M4806 Spinal stenosis, lumbar region: Secondary | ICD-10-CM | POA: Diagnosis present

## 2016-05-09 SURGERY — POSTERIOR LUMBAR FUSION 1 LEVEL
Anesthesia: General | Site: Back

## 2016-05-09 MED ORDER — HYDROMORPHONE HCL 1 MG/ML IJ SOLN
INTRAMUSCULAR | Status: AC
  Administered 2016-05-09: 0.5 mg via INTRAVENOUS
  Filled 2016-05-09: qty 1

## 2016-05-09 MED ORDER — SODIUM CHLORIDE 0.9% FLUSH: 3.0000 mL | INTRAVENOUS | Status: DC | PRN

## 2016-05-09 MED ORDER — PROMETHAZINE HCL 25 MG/ML IJ SOLN
6.2500 mg | INTRAMUSCULAR | Status: DC | PRN
  Administered 2016-05-09: 6.25 mg via INTRAVENOUS

## 2016-05-09 MED ORDER — DIAZEPAM 5 MG PO TABS
5.0000 mg | ORAL_TABLET | Freq: Four times a day (QID) | ORAL | Status: DC | PRN
  Administered 2016-05-09 – 2016-05-12 (×5): 5 mg via ORAL
  Filled 2016-05-09 (×2): qty 1

## 2016-05-09 MED ORDER — ALBUTEROL SULFATE (2.5 MG/3ML) 0.083% IN NEBU: 2.5000 mg | INHALATION_SOLUTION | Freq: Four times a day (QID) | RESPIRATORY_TRACT | Status: DC | PRN

## 2016-05-09 MED ORDER — ONDANSETRON HCL 4 MG/2ML IJ SOLN
INTRAMUSCULAR | Status: DC | PRN
  Administered 2016-05-09: 4 mg via INTRAVENOUS

## 2016-05-09 MED ORDER — ROCURONIUM BROMIDE 50 MG/5ML IV SOLN
INTRAVENOUS | Status: AC
  Filled 2016-05-09: qty 2

## 2016-05-09 MED ORDER — SODIUM CHLORIDE 0.9 % IV SOLN: 250.0000 mL | INTRAVENOUS | Status: DC

## 2016-05-09 MED ORDER — VANCOMYCIN HCL 1000 MG IV SOLR
INTRAVENOUS | Status: AC
  Filled 2016-05-09: qty 1000

## 2016-05-09 MED ORDER — AMLODIPINE BESYLATE 5 MG PO TABS
5.0000 mg | ORAL_TABLET | Freq: Every day | ORAL | Status: DC
  Administered 2016-05-09 – 2016-05-11 (×3): 5 mg via ORAL
  Filled 2016-05-09 (×5): qty 1

## 2016-05-09 MED ORDER — PHENOL 1.4 % MT LIQD
1.0000 | OROMUCOSAL | Status: DC | PRN
Start: 2016-05-09 — End: 2016-05-13

## 2016-05-09 MED ORDER — PROPOFOL 10 MG/ML IV BOLUS
INTRAVENOUS | Status: AC
  Filled 2016-05-09: qty 40

## 2016-05-09 MED ORDER — HYDROXYZINE HCL 25 MG PO TABS
25.0000 mg | ORAL_TABLET | Freq: Every day | ORAL | Status: DC
  Administered 2016-05-09 – 2016-05-12 (×4): 25 mg via ORAL
  Filled 2016-05-09 (×4): qty 1

## 2016-05-09 MED ORDER — ONDANSETRON HCL 4 MG/2ML IJ SOLN: 4.0000 mg | INTRAMUSCULAR | Status: DC | PRN

## 2016-05-09 MED ORDER — HYDROMORPHONE HCL 1 MG/ML IJ SOLN
0.2500 mg | INTRAMUSCULAR | Status: DC | PRN
  Administered 2016-05-09 (×4): 0.5 mg via INTRAVENOUS

## 2016-05-09 MED ORDER — OXYCODONE HCL 5 MG PO TABS
5.0000 mg | ORAL_TABLET | ORAL | Status: DC | PRN
  Administered 2016-05-09 – 2016-05-11 (×3): 5 mg via ORAL
  Filled 2016-05-09 (×3): qty 1

## 2016-05-09 MED ORDER — DIMENHYDRINATE 50 MG PO TABS: 50.0000 mg | ORAL_TABLET | Freq: Three times a day (TID) | ORAL | Status: DC | PRN

## 2016-05-09 MED ORDER — ONDANSETRON HCL 4 MG/2ML IJ SOLN
INTRAMUSCULAR | Status: AC
  Filled 2016-05-09: qty 2

## 2016-05-09 MED ORDER — PROPOFOL 10 MG/ML IV BOLUS
INTRAVENOUS | Status: DC | PRN
  Administered 2016-05-09: 150 mg via INTRAVENOUS

## 2016-05-09 MED ORDER — LACTATED RINGERS IV SOLN
INTRAVENOUS | Status: DC
  Administered 2016-05-09 (×3): via INTRAVENOUS

## 2016-05-09 MED ORDER — CEFAZOLIN SODIUM-DEXTROSE 2-4 GM/100ML-% IV SOLN
2.0000 g | Freq: Three times a day (TID) | INTRAVENOUS | Status: AC
  Administered 2016-05-09 – 2016-05-10 (×2): 2 g via INTRAVENOUS
  Filled 2016-05-09 (×2): qty 100

## 2016-05-09 MED ORDER — LIDOCAINE HCL (CARDIAC) 20 MG/ML IV SOLN
INTRAVENOUS | Status: DC | PRN
  Administered 2016-05-09: 100 mg via INTRAVENOUS

## 2016-05-09 MED ORDER — 0.9 % SODIUM CHLORIDE (POUR BTL) OPTIME
TOPICAL | Status: DC | PRN
Start: 2016-05-09 — End: 2016-05-09
  Administered 2016-05-09: 1000 mL

## 2016-05-09 MED ORDER — ARTIFICIAL TEARS OP OINT
TOPICAL_OINTMENT | OPHTHALMIC | Status: DC | PRN
  Administered 2016-05-09: 1 via OPHTHALMIC

## 2016-05-09 MED ORDER — PHENYLEPHRINE 40 MCG/ML (10ML) SYRINGE FOR IV PUSH (FOR BLOOD PRESSURE SUPPORT)
PREFILLED_SYRINGE | INTRAVENOUS | Status: AC
  Filled 2016-05-09: qty 10

## 2016-05-09 MED ORDER — GABAPENTIN 300 MG PO CAPS
300.0000 mg | ORAL_CAPSULE | Freq: Three times a day (TID) | ORAL | Status: DC
  Administered 2016-05-09 – 2016-05-13 (×12): 300 mg via ORAL
  Filled 2016-05-09 (×12): qty 1

## 2016-05-09 MED ORDER — CYCLOBENZAPRINE HCL 10 MG PO TABS
10.0000 mg | ORAL_TABLET | Freq: Three times a day (TID) | ORAL | Status: DC | PRN
  Administered 2016-05-09 – 2016-05-13 (×4): 10 mg via ORAL
  Filled 2016-05-09 (×5): qty 1

## 2016-05-09 MED ORDER — DIAZEPAM 5 MG PO TABS
ORAL_TABLET | ORAL | Status: AC
  Filled 2016-05-09: qty 1

## 2016-05-09 MED ORDER — FENTANYL CITRATE (PF) 250 MCG/5ML IJ SOLN
INTRAMUSCULAR | Status: AC
  Filled 2016-05-09: qty 10

## 2016-05-09 MED ORDER — LIDOCAINE 2% (20 MG/ML) 5 ML SYRINGE
INTRAMUSCULAR | Status: AC
  Filled 2016-05-09: qty 10

## 2016-05-09 MED ORDER — HYDROCODONE-ACETAMINOPHEN 5-325 MG PO TABS
1.0000 | ORAL_TABLET | ORAL | Status: DC | PRN
  Administered 2016-05-12 – 2016-05-13 (×4): 2 via ORAL
  Administered 2016-05-13: 1 via ORAL
  Administered 2016-05-13: 2 via ORAL
  Filled 2016-05-09 (×6): qty 2

## 2016-05-09 MED ORDER — VITAMIN D 1000 UNITS PO TABS
2000.0000 [IU] | ORAL_TABLET | Freq: Every day | ORAL | Status: DC
  Administered 2016-05-09 – 2016-05-13 (×5): 2000 [IU] via ORAL
  Filled 2016-05-09 (×5): qty 2

## 2016-05-09 MED ORDER — SUGAMMADEX SODIUM 200 MG/2ML IV SOLN
INTRAVENOUS | Status: DC | PRN
Start: 2016-05-09 — End: 2016-05-09
  Administered 2016-05-09: 200 mg via INTRAVENOUS

## 2016-05-09 MED ORDER — SUCCINYLCHOLINE CHLORIDE 200 MG/10ML IV SOSY
PREFILLED_SYRINGE | INTRAVENOUS | Status: AC
  Filled 2016-05-09: qty 20

## 2016-05-09 MED ORDER — SUCCINYLCHOLINE CHLORIDE 20 MG/ML IJ SOLN
INTRAMUSCULAR | Status: DC | PRN
  Administered 2016-05-09: 100 mg via INTRAVENOUS

## 2016-05-09 MED ORDER — HEMOSTATIC AGENTS (NO CHARGE) OPTIME
TOPICAL | Status: DC | PRN
  Administered 2016-05-09: 1 via TOPICAL

## 2016-05-09 MED ORDER — DULOXETINE HCL 60 MG PO CPEP
60.0000 mg | ORAL_CAPSULE | Freq: Every day | ORAL | Status: DC
  Administered 2016-05-09 – 2016-05-13 (×5): 60 mg via ORAL
  Filled 2016-05-09 (×5): qty 1

## 2016-05-09 MED ORDER — HYDROMORPHONE HCL 1 MG/ML IJ SOLN
0.5000 mg | INTRAMUSCULAR | Status: DC | PRN
  Administered 2016-05-09 – 2016-05-11 (×8): 1 mg via INTRAVENOUS
  Filled 2016-05-09 (×8): qty 1

## 2016-05-09 MED ORDER — HYDROMORPHONE HCL 1 MG/ML IJ SOLN
INTRAMUSCULAR | Status: AC
  Filled 2016-05-09: qty 1

## 2016-05-09 MED ORDER — OXYCODONE-ACETAMINOPHEN 10-325 MG PO TABS: 1.0000 | ORAL_TABLET | ORAL | Status: DC | PRN

## 2016-05-09 MED ORDER — DEXTROSE 5 % IV SOLN
INTRAVENOUS | Status: DC | PRN
  Administered 2016-05-09: 08:00:00 via INTRAVENOUS

## 2016-05-09 MED ORDER — CHLORHEXIDINE GLUCONATE CLOTH 2 % EX PADS: 6.0000 | MEDICATED_PAD | Freq: Once | CUTANEOUS | Status: DC

## 2016-05-09 MED ORDER — SUGAMMADEX SODIUM 200 MG/2ML IV SOLN
INTRAVENOUS | Status: AC
  Filled 2016-05-09: qty 2

## 2016-05-09 MED ORDER — GLYCOPYRROLATE 0.2 MG/ML IV SOSY
PREFILLED_SYRINGE | INTRAVENOUS | Status: AC
  Filled 2016-05-09: qty 3

## 2016-05-09 MED ORDER — ROCURONIUM BROMIDE 100 MG/10ML IV SOLN
INTRAVENOUS | Status: DC | PRN
  Administered 2016-05-09: 50 mg via INTRAVENOUS

## 2016-05-09 MED ORDER — SODIUM CHLORIDE 0.9 % IR SOLN
Status: DC | PRN
  Administered 2016-05-09: 09:00:00

## 2016-05-09 MED ORDER — MIDAZOLAM HCL 5 MG/5ML IJ SOLN
INTRAMUSCULAR | Status: DC | PRN
  Administered 2016-05-09: 2 mg via INTRAVENOUS

## 2016-05-09 MED ORDER — OXYCODONE-ACETAMINOPHEN 5-325 MG PO TABS: 1.0000 | ORAL_TABLET | ORAL | Status: DC | PRN

## 2016-05-09 MED ORDER — BUPIVACAINE HCL (PF) 0.25 % IJ SOLN
INTRAMUSCULAR | Status: DC | PRN
  Administered 2016-05-09: 30 mL

## 2016-05-09 MED ORDER — SODIUM CHLORIDE 0.9% FLUSH
3.0000 mL | Freq: Two times a day (BID) | INTRAVENOUS | Status: DC
  Administered 2016-05-09 – 2016-05-10 (×2): 3 mL via INTRAVENOUS

## 2016-05-09 MED ORDER — MENTHOL 3 MG MT LOZG: 1.0000 | LOZENGE | OROMUCOSAL | Status: DC | PRN

## 2016-05-09 MED ORDER — NALOXONE HCL 0.4 MG/ML IJ SOLN: 0.4000 mg | Freq: Once | INTRAMUSCULAR | Status: DC | PRN

## 2016-05-09 MED ORDER — VANCOMYCIN HCL 1000 MG IV SOLR
INTRAVENOUS | Status: DC | PRN
  Administered 2016-05-09: 1000 mg

## 2016-05-09 MED ORDER — ONDANSETRON 4 MG PO TBDP
8.0000 mg | ORAL_TABLET | Freq: Three times a day (TID) | ORAL | Status: DC | PRN
  Administered 2016-05-10: 8 mg via ORAL
  Filled 2016-05-09: qty 2

## 2016-05-09 MED ORDER — METOPROLOL SUCCINATE ER 25 MG PO TB24
50.0000 mg | ORAL_TABLET | Freq: Every day | ORAL | Status: DC
  Administered 2016-05-10 – 2016-05-12 (×3): 50 mg via ORAL
  Filled 2016-05-09 (×4): qty 2

## 2016-05-09 MED ORDER — PANTOPRAZOLE SODIUM 40 MG PO TBEC
40.0000 mg | DELAYED_RELEASE_TABLET | Freq: Every day | ORAL | Status: DC
  Administered 2016-05-09 – 2016-05-13 (×5): 40 mg via ORAL
  Filled 2016-05-09 (×5): qty 1

## 2016-05-09 MED ORDER — OXYCODONE-ACETAMINOPHEN 5-325 MG PO TABS
1.0000 | ORAL_TABLET | ORAL | Status: DC | PRN
  Administered 2016-05-10 – 2016-05-12 (×4): 1 via ORAL
  Filled 2016-05-09 (×4): qty 1

## 2016-05-09 MED ORDER — MIDAZOLAM HCL 2 MG/2ML IJ SOLN
INTRAMUSCULAR | Status: AC
  Filled 2016-05-09: qty 2

## 2016-05-09 MED ORDER — DIAZEPAM 5 MG PO TABS
5.0000 mg | ORAL_TABLET | Freq: Three times a day (TID) | ORAL | Status: DC | PRN
  Administered 2016-05-12: 5 mg via ORAL
  Filled 2016-05-09 (×3): qty 1

## 2016-05-09 MED ORDER — ARTIFICIAL TEARS OP OINT
TOPICAL_OINTMENT | OPHTHALMIC | Status: AC
  Filled 2016-05-09: qty 7

## 2016-05-09 MED ORDER — PROMETHAZINE HCL 25 MG/ML IJ SOLN
INTRAMUSCULAR | Status: AC
  Filled 2016-05-09: qty 1

## 2016-05-09 MED ORDER — PHENYLEPHRINE HCL 10 MG/ML IJ SOLN
INTRAVENOUS | Status: DC | PRN
  Administered 2016-05-09: 10 ug/min via INTRAVENOUS

## 2016-05-09 MED ORDER — THROMBIN 20000 UNITS EX SOLR
CUTANEOUS | Status: DC | PRN
  Administered 2016-05-09: 09:00:00 via TOPICAL

## 2016-05-09 MED ORDER — ACETAMINOPHEN 650 MG RE SUPP: 650.0000 mg | RECTAL | Status: DC | PRN

## 2016-05-09 MED ORDER — ATROPINE SULFATE 0.4 MG/ML IV SOSY
PREFILLED_SYRINGE | INTRAVENOUS | Status: AC
  Filled 2016-05-09: qty 2.5

## 2016-05-09 MED ORDER — ACETAMINOPHEN 325 MG PO TABS: 650.0000 mg | ORAL_TABLET | ORAL | Status: DC | PRN

## 2016-05-09 MED ORDER — FENTANYL CITRATE (PF) 100 MCG/2ML IJ SOLN
INTRAMUSCULAR | Status: DC | PRN
  Administered 2016-05-09 (×3): 50 ug via INTRAVENOUS
  Administered 2016-05-09: 100 ug via INTRAVENOUS

## 2016-05-09 SURGICAL SUPPLY — 67 items
BAG DECANTER FOR FLEXI CONT (MISCELLANEOUS) ×3 IMPLANT
BENZOIN TINCTURE PRP APPL 2/3 (GAUZE/BANDAGES/DRESSINGS) ×3 IMPLANT
BLADE CLIPPER SURG (BLADE) ×3 IMPLANT
BRUSH SCRUB EZ PLAIN DRY (MISCELLANEOUS) ×3 IMPLANT
BUR CUTTER 7.0 ROUND (BURR) IMPLANT
BUR MATCHSTICK NEURO 3.0 LAGG (BURR) ×3 IMPLANT
CANISTER SUCT 3000ML PPV (MISCELLANEOUS) ×3 IMPLANT
CAP LCK SPNE (Orthopedic Implant) ×4 IMPLANT
CAP LOCK SPINE RADIUS (Orthopedic Implant) ×4 IMPLANT
CAP LOCKING (Orthopedic Implant) ×8 IMPLANT
CLOSURE WOUND 1/2 X4 (GAUZE/BANDAGES/DRESSINGS) ×1
CONT SPEC 4OZ CLIKSEAL STRL BL (MISCELLANEOUS) ×3 IMPLANT
COVER BACK TABLE 60X90IN (DRAPES) ×3 IMPLANT
DECANTER SPIKE VIAL GLASS SM (MISCELLANEOUS) ×3 IMPLANT
DEVICE INTERBODY ELEVATE 23X8 (Cage) ×4 IMPLANT
DRAPE C-ARM 42X72 X-RAY (DRAPES) ×6 IMPLANT
DRAPE HALF SHEET 40X57 (DRAPES) IMPLANT
DRAPE LAPAROTOMY 100X72X124 (DRAPES) ×3 IMPLANT
DRAPE MICROSCOPE LEICA (MISCELLANEOUS) ×3 IMPLANT
DRAPE POUCH INSTRU U-SHP 10X18 (DRAPES) ×3 IMPLANT
DRAPE SURG 17X23 STRL (DRAPES) ×12 IMPLANT
DRSG OPSITE POSTOP 4X6 (GAUZE/BANDAGES/DRESSINGS) ×3 IMPLANT
DURAPREP 26ML APPLICATOR (WOUND CARE) ×3 IMPLANT
DURASEAL APPLICATOR TIP (TIP) ×3 IMPLANT
DURASEAL SPINE SEALANT 3ML (MISCELLANEOUS) ×3 IMPLANT
ELECT REM PT RETURN 9FT ADLT (ELECTROSURGICAL) ×3
ELECTRODE REM PT RTRN 9FT ADLT (ELECTROSURGICAL) ×1 IMPLANT
EVACUATOR 1/8 PVC DRAIN (DRAIN) ×3 IMPLANT
GAUZE SPONGE 4X4 12PLY STRL (GAUZE/BANDAGES/DRESSINGS) ×3 IMPLANT
GAUZE SPONGE 4X4 16PLY XRAY LF (GAUZE/BANDAGES/DRESSINGS) IMPLANT
GLOVE BIOGEL PI IND STRL 7.5 (GLOVE) ×1 IMPLANT
GLOVE BIOGEL PI INDICATOR 7.5 (GLOVE) ×2
GLOVE ECLIPSE 7.0 STRL STRAW (GLOVE) ×3 IMPLANT
GLOVE ECLIPSE 9.0 STRL (GLOVE) ×6 IMPLANT
GLOVE EXAM NITRILE LRG STRL (GLOVE) IMPLANT
GLOVE EXAM NITRILE MD LF STRL (GLOVE) IMPLANT
GLOVE EXAM NITRILE XL STR (GLOVE) IMPLANT
GLOVE EXAM NITRILE XS STR PU (GLOVE) IMPLANT
GOWN STRL REUS W/ TWL LRG LVL3 (GOWN DISPOSABLE) IMPLANT
GOWN STRL REUS W/ TWL XL LVL3 (GOWN DISPOSABLE) ×2 IMPLANT
GOWN STRL REUS W/TWL 2XL LVL3 (GOWN DISPOSABLE) IMPLANT
GOWN STRL REUS W/TWL LRG LVL3 (GOWN DISPOSABLE)
GOWN STRL REUS W/TWL XL LVL3 (GOWN DISPOSABLE) ×4
KIT BASIN OR (CUSTOM PROCEDURE TRAY) ×3 IMPLANT
KIT ROOM TURNOVER OR (KITS) ×3 IMPLANT
LIQUID BAND (GAUZE/BANDAGES/DRESSINGS) ×3 IMPLANT
NEEDLE HYPO 22GX1.5 SAFETY (NEEDLE) ×3 IMPLANT
NS IRRIG 1000ML POUR BTL (IV SOLUTION) ×3 IMPLANT
PACK LAMINECTOMY NEURO (CUSTOM PROCEDURE TRAY) ×3 IMPLANT
ROD RADIUS 35MM (Rod) ×3 IMPLANT
ROD RADIUS 40MM (Neuro Prosthesis/Implant) ×2 IMPLANT
ROD SPNL 40X5.5XNS TI RDS (Neuro Prosthesis/Implant) ×1 IMPLANT
RUBBERBAND STERILE (MISCELLANEOUS) ×6 IMPLANT
SCREW 5.75X40M (Screw) ×12 IMPLANT
SPACER SPNL XLORDOTIC 23X8X (Cage) ×2 IMPLANT
SPCR SPNL XLORDOTIC 23X8X (Cage) ×2 IMPLANT
SPONGE SURGIFOAM ABS GEL 100 (HEMOSTASIS) ×3 IMPLANT
STRIP CLOSURE SKIN 1/2X4 (GAUZE/BANDAGES/DRESSINGS) ×2 IMPLANT
SUT PROLENE 6 0 BV (SUTURE) ×3 IMPLANT
SUT VIC AB 0 CT1 18XCR BRD8 (SUTURE) ×1 IMPLANT
SUT VIC AB 0 CT1 8-18 (SUTURE) ×2
SUT VIC AB 2-0 CT1 18 (SUTURE) ×3 IMPLANT
SUT VIC AB 3-0 SH 8-18 (SUTURE) ×3 IMPLANT
TOWEL OR 17X24 6PK STRL BLUE (TOWEL DISPOSABLE) ×3 IMPLANT
TOWEL OR 17X26 10 PK STRL BLUE (TOWEL DISPOSABLE) ×3 IMPLANT
TRAY FOLEY W/METER SILVER 16FR (SET/KITS/TRAYS/PACK) ×3 IMPLANT
WATER STERILE IRR 1000ML POUR (IV SOLUTION) ×3 IMPLANT

## 2016-05-09 NOTE — Anesthesia Postprocedure Evaluation (Signed)
Anesthesia Post Note  Patient: Carla [name]  Procedure(s) Performed: Procedure(s) (LRB): Posterior Lumbar Interbody Fusion- Lumbar Four- Lumbar Five (N/A)  Patient location during evaluation: PACU Anesthesia Type: General Level of consciousness: awake and alert Pain management: pain level controlled Vital Signs Assessment: post-procedure vital signs reviewed and stable Respiratory status: spontaneous breathing, nonlabored ventilation, respiratory function stable and patient connected to nasal cannula oxygen Cardiovascular status: blood pressure returned to baseline and stable Postop Assessment: no signs of nausea or vomiting Anesthetic complications: no    Last Vitals:  Vitals:   05/09/16 1300 05/09/16 1312  BP:    Pulse: 61 (!) 58  Resp: 20 11  Temp:  37.2 C    Last Pain:  Vitals:   05/09/16 1300  TempSrc:   PainSc: Asleep                 [name],[name]

## 2016-05-09 NOTE — Anesthesia Procedure Notes (Signed)
Procedure Name: Intubation Date/Time: 05/09/2016 8:10 AM Performed by: Wray Kearns A Pre-anesthesia Checklist: Patient identified, Emergency Drugs available, Suction available, Patient being monitored and Timeout performed Patient Re-evaluated:Patient Re-evaluated prior to inductionOxygen Delivery Method: Circle System Utilized Preoxygenation: Pre-oxygenation with 100% oxygen Intubation Type: IV induction Ventilation: Mask ventilation without difficulty Laryngoscope Size: Mac and 4 Grade View: Grade I Tube type: Oral Tube size: 7.5 mm Number of attempts: 1 Airway Equipment and Method: Stylet and Oral airway Placement Confirmation: ETT inserted through vocal cords under direct vision,  positive ETCO2 and breath sounds checked- equal and bilateral Secured at: 22 cm Tube secured with: Tape Dental Injury: Teeth and Oropharynx as per pre-operative assessment

## 2016-05-09 NOTE — Anesthesia Preprocedure Evaluation (Signed)
Anesthesia Evaluation  Patient identified by MRN, date of birth, ID band Patient awake    Reviewed: Allergy & Precautions, NPO status , Patient's Chart, lab work & pertinent test results  History of Anesthesia Complications Negative for: history of anesthetic complications  Airway Mallampati: II  TM Distance: >3 FB Neck ROM: Full    Dental   Pulmonary shortness of breath, COPD, Current Smoker,    breath sounds clear to auscultation       Cardiovascular hypertension,  Rhythm:Regular Rate:Normal     Neuro/Psych    GI/Hepatic GERD  ,  Endo/Other  diabetesMorbid obesity  Renal/GU      Musculoskeletal  (+) Arthritis ,   Abdominal   Peds  Hematology   Anesthesia Other Findings   Reproductive/Obstetrics                             Anesthesia Physical Anesthesia Plan  ASA: III  Anesthesia Plan: General   Post-op Pain Management:    Induction: Intravenous  Airway Management Planned: Oral ETT  Additional Equipment:   Intra-op Plan:   Post-operative Plan: Extubation in OR  Informed Consent: I have reviewed the patients History and Physical, chart, labs and discussed the procedure including the risks, benefits and alternatives for the proposed anesthesia with the patient or authorized representative who has indicated his/her understanding and acceptance.     Plan Discussed with: CRNA  Anesthesia Plan Comments:         Anesthesia Quick Evaluation

## 2016-05-09 NOTE — Transfer of Care (Signed)
Immediate Anesthesia Transfer of Care Note  Patient: Carla [name]  Procedure(s) Performed: Procedure(s): Posterior Lumbar Interbody Fusion- Lumbar Four- Lumbar Five (N/A)  Patient Location: PACU  Anesthesia Type:General  Level of Consciousness: sedated, patient cooperative and responds to stimulation  Airway & Oxygen Therapy: Patient Spontanous Breathing and Patient connected to nasal cannula oxygen  Post-op Assessment: Report given to RN, Post -op Vital signs reviewed and stable, Patient moving all extremities and Patient moving all extremities X 4  Post vital signs: Reviewed and stable  Last Vitals:  Vitals:   05/09/16 0641 05/09/16 1104  BP: 129/70   Pulse: (!) 55   Resp: 18   Temp:  36.9 C    Last Pain:  Vitals:   05/09/16 0647  TempSrc:   PainSc: 9       Patients Stated Pain Goal: 2 (05/09/16 0647)  Complications: No apparent anesthesia complications

## 2016-05-09 NOTE — Op Note (Signed)
Date of procedure: 05/09/2016  Date of dictation: Same  Service: Neurosurgery  Preoperative diagnosis: L4-L5 degenerative spondylolisthesis with severe stenosis  Postoperative diagnosis: Same  Procedure Name: Bilateral L4-5 decompressive laminotomies with foraminotomies, more than would be required for simple interbody fusion alone.  L4-5 posterior lumbar interbody fusion utilizing interbody expandable cages and local autograft  L4-5 posterior lateral arthrodesis utilizing nonsegmental pedicle screw instrumentation and local autograft  Surgeon:Amaria Mundorf A.Abygayle Deltoro, M.D.  Asst. Surgeon: Conchita Paris  Anesthesia: General  Indication: 65 year old female with severe back and bilateral lower extremity symptoms failing conservative management. Workup demonstrates evidence of marked facet arthropathy and early degenerative grade 1 spondylolisthesis with severe stenosis. Patient is failed conservative management presents now for decompression infusion in hopes of improving her symptoms.  Operative note: After induction of anesthesia, patient position prone onto Wilson frame and appropriate padded. Lumbar region prepped and draped sterilely. Incision made overlying L4-5. Dissection performed bilaterally. Retractor placed. Fluoroscopy used. Levels confirmed. Decompressive laminotomies and foraminotomies then performed using high-speed drill and Kerrison rongeurs to remove the inferior aspect lamina of L4 the entire inferior facet of L4 bilaterally the entire superior facet of L5 bilaterally and the superior rim of the L5 lamina. Lymphoma is elevated and resected these will fashion using Kerrison rongeurs. There is a small hole in the root sleeve of the L4 nerve root on the right side which is oversewn in a watertight fashion. Bilateral discectomies then performed at L4-5. Disc spaces and distracted up to 10 mm. Disc space prepared for interbody fusion. 8 mm Medtronic expandable cage packed with locally harvested  autograft impacted in place and expanded to its full extent. Distractor was removed patient's contralateral side. Disc space was again prepared for interbody fusion. Morselize autograft was packed and interspace. A second cage was then packed into place and expanded fully. Pedicles of L4 and L5 were then verified using surface landmarks and intraoperative fluoroscopy. Sufficient bone overlying the pedicle was then removed using high-speed drill each pedicles and probed using pedicle awl each pedicle awl track was then tapped each screw tap hole was probed and found to be solidly within the bone. 5.75 x 40 mm radius brand screws from [employer] were placed bilaterally at L4 and L5. Transverse processes and residual facet were decorticated. Morselize autograft was packed posterior laterally. Short segment titanium rod was placed over the screw heads. Locking caps and placed over the screws locking caps were engaged with the construct are mild compression. Final images revealed good position the bone graft hardware at proper upper level with normal lamina spine. Wounds and irrigated one final time. Thank Meissen powder was placed the deep wound space. DuraSeal was placed over the dural repair of the right L4 nerve root sleeve. Wounds and close in layers of Vicryl sutures. Steri-Strips sterile dressing were applied. No apparent complications. Patient tolerated procedure well and she returns to the recovery room postop.

## 2016-05-09 NOTE — H&P (Signed)
Carla [name] is an 65 y.o. female.   Chief Complaint: Back and bilateral leg pain HPI: 65 year old female with severe back and bilateral lower extremity pain. Symptoms have become progressively debilitating for the patient. Patient has severe pain with attempts to stand or ambulate. She has become essentially nonambulatory secondary to this pain. She has become very deconditioned from this pain. Pain radiates into both lower extremities particularly with standing or attempting to walk. She has numbness and paresthesias associated with this pain. She has no bowel or bladder dysfunction. Workup demonstrates evidence of marked spondylosis and severe stenosis at L4-5. Patient presents now for decompression and fusion at L4-5.  Past Medical History:  Diagnosis Date  . Anxiety   . Arthritis   . Bronchitis   . Chronic back pain   . Chronic knee pain   . COPD (chronic obstructive pulmonary disease) (HCC)   . 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 Dizziness   . Frequent falls   . GERD (gastroesophageal reflux disease)   . Hypertension   . Memory difficulty 2015  . Neuropathy (HCC)   . Pain management   . Prediabetes   . 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 Shortness of breath dyspnea    with exertion  . 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 Weakness of both legs     Past Surgical History:  Procedure Laterality Date  . APPENDECTOMY  1971  . CHOLECYSTECTOMY N/A 03/12/2016   Procedure: LAPAROSCOPIC CHOLECYSTECTOMY;  Surgeon: Ancil Linsey, MD;  Location: [facility];  Service: General;  Laterality: N/A;  . KNEE CARTILAGE SURGERY Right 2007  . TONSILLECTOMY  1971    Family History  Problem Relation Age of Onset  . Heart failure Father   . Diabetes Father   . Kidney failure Father    Social History:  reports that she has been smoking Cigarettes.  She started smoking about 45 years  ago. She has a 12.50 pack-year smoking history. She has never used smokeless tobacco. She reports that she does not drink alcohol or use drugs.  Allergies:  Allergies  Allergen Reactions  . Bee Venom Anaphylaxis  . Coconut Flavor Anaphylaxis    Per patient  . Mushroom Extract Complex Anaphylaxis    Extreme sweating, vomiting  . Amitriptyline Other (See Comments)    Has nightmares when using, not in right state of mind   . Toradol [Ketorolac Tromethamine] Nausea And Vomiting  . Tramadol Nausea And Vomiting  . Trazodone And Nefazodone Nausea And Vomiting    Medications Prior to Admission  Medication Sig Dispense Refill  . albuterol (PROVENTIL HFA;VENTOLIN HFA) 108 (90 BASE) MCG/ACT inhaler Inhale 2 puffs into the lungs every 6 (six) hours as needed for wheezing. 1 Inhaler 2  . amLODipine (NORVASC) 2.5 MG tablet Take 2 tablets (5 mg total) by mouth daily.    Marland Kitchen aspirin EC 81 MG tablet Take 1 tablet (81 mg total) by mouth daily. (Patient taking differently: Take 81 mg by mouth every morning. )    . Cholecalciferol (VITAMIN D) 2000 units CAPS Take 2,000 Units by mouth daily.     . cyclobenzaprine (FLEXERIL) 10 MG tablet Take 10 mg by mouth 3 (three) times daily as needed for muscle spasms.    . diazepam (VALIUM) 5 MG tablet Take 5 mg by mouth 3 (three) times daily as needed for anxiety.    . dimenhyDRINATE (DRAMAMINE) 50 MG tablet Take 50  mg by mouth every 8 (eight) hours as needed for dizziness.    . DULoxetine (CYMBALTA) 60 MG capsule Take 60 mg by mouth daily.     Marland Kitchen EVZIO 0.4 MG/0.4ML SOAJ Inject 0.4 mLs into the muscle once as needed (for Anaphylaxis/allergic reaction).     . gabapentin (NEURONTIN) 300 MG capsule Take 300 mg by mouth 3 (three) times daily.    . hydrOXYzine (VISTARIL) 25 MG capsule Take 25 mg by mouth at bedtime.    . metoprolol succinate (TOPROL-XL) 50 MG 24 hr tablet Take 50 mg by mouth at bedtime.    Marland Kitchen omeprazole (PRILOSEC) 20 MG capsule Take 1 capsule (20 mg total) by  mouth daily. 30 capsule 0  . ondansetron (ZOFRAN ODT) 8 MG disintegrating tablet Take 1 tablet (8 mg total) by mouth every 8 (eight) hours as needed for nausea or vomiting. 20 tablet 0  . oxyCODONE-acetaminophen (PERCOCET) 10-325 MG tablet Take 1 tablet by mouth every 4 (four) hours as needed for pain. 20 tablet 0  . VOLTAREN 1 % GEL Apply 4 g topically every 8 (eight) hours.       No results found for this or any previous visit (from the past 48 hour(s)). No results found.  Pertinent items noted in HPI and remainder of comprehensive ROS otherwise negative.  Blood pressure 129/70, pulse (!) 55, resp. rate 18, height 5\' 4"  (1.626 m), weight 93.9 kg (207 lb), SpO2 100 %.  Patient is awake and alert. She is oriented and appropriate. Her speech is fluent. Judgment and insight are intact. Her motor examination is normal bilaterally except she has mild dorsiflexion weakness in both feet. Degenerative facet hypoactive. Achilles reflexes are absent. No evidence of long track signs. Gait is not tested. Chest and abdomen are benign. Extremities are free from injury or deformity. Assessment/Plan L4-L5 degenerative spondylolisthesis with stenosis and severe neurogenic claudication. Plan L4-5 bilateral decompressive laminotomies and foraminotomies followed by posterior lumbar body fusion utilizing interbody cages and locally harvested autograft coupled with posterior lateral arthrodesis utilizing nonsegmental pedicle screw instrumentation and local autograft. Risks and benefits of been explained. Patient wishes to proceed.  Carla [name] A 05/09/2016, 7:45 AM

## 2016-05-09 NOTE — Brief Op Note (Signed)
05/09/2016  10:44 AM  PATIENT:  Carla [name]  65 y.o. female  PRE-OPERATIVE DIAGNOSIS:  spondylolisthesis  POST-OPERATIVE DIAGNOSIS:  spondylolisthesis  PROCEDURE:  Procedure(s): Posterior Lumbar Interbody Fusion- Lumbar Four- Lumbar Five (N/A)  SURGEON:  Surgeon(s) and Role:    * Julio Sicks, MD - Primary    * Lisbeth Renshaw, MD - Assisting  PHYSICIAN ASSISTANT:   ASSISTANTS:    ANESTHESIA:   general  EBL:  Total I/O In: 1100 [I.V.:1100] Out: 450 [Urine:350; Blood:100]  BLOOD ADMINISTERED:none  DRAINS: none   LOCAL MEDICATIONS USED:  MARCAINE     SPECIMEN:  No Specimen  DISPOSITION OF SPECIMEN:  N/A  COUNTS:  YES  TOURNIQUET:  * No tourniquets in log *  DICTATION: .Dragon Dictation  PLAN OF CARE: Admit to inpatient   PATIENT DISPOSITION:  PACU - hemodynamically stable.   Delay start of Pharmacological VTE agent (>24hrs) due to surgical blood loss or risk of bleeding: yes

## 2016-05-09 NOTE — Care Management Note (Signed)
Case Management Note  Patient Details  Name: HIRAL DER MRN: [id number] Date of Birth: October 08, 1951  Subjective/Objective:   Pt underwent:Posterior Lumbar Interbody Fusion- Lumbar Four- Lumbar Five. She is from home with family.   Action/Plan: Awaiting PT/OT recommendations. CM following for discharge needs.   Expected Discharge Date:                  Expected Discharge Plan:     In-House Referral:     Discharge planning Services     Post Acute Care Choice:    Choice offered to:     DME Arranged:    DME Agency:     HH Arranged:    HH Agency:     Status of Service:  In process, will continue to follow  If discussed at Long Length of Stay Meetings, dates discussed:    Additional Comments:  Kermit Balo, RN 05/09/2016, 3:05 PM

## 2016-05-09 NOTE — Progress Notes (Signed)
Patient admitted form PACU. Patient alert and oriented x 4. Patient oriented to room and made comfortable.

## 2016-05-10 NOTE — Progress Notes (Signed)
No acute events AVSS Moving legs well Wound looks good PT/OT Dispo planning

## 2016-05-10 NOTE — Evaluation (Signed)
Physical Therapy Evaluation Patient Details Name: Carla [name] MRN: [id number] DOB: Sep 23, 1951 Today's Date: 05/10/2016   History of Present Illness  Patient is 65 yo female s/p :Posterior Lumbar Interbody Fusion- Lumbar Four- Lumbar Five.   Clinical Impression  Patient demonstrates deficits in functional mobility as indicated below. Will need continued skilled PT to address deficits and maximize function. Will see as indicated and progress as tolerated. Recommend ST SNF upon acute discharge.    Follow Up Recommendations SNF;Supervision/Assistance - 24 hour    Equipment Recommendations  None recommended by PT    Recommendations for Other Services       Precautions / Restrictions Precautions Precautions: Back;Fall Precaution Comments: verbally reviewed with patient Required Braces or Orthoses: Spinal Brace Restrictions Weight Bearing Restrictions: No      Mobility  Bed Mobility Overal bed mobility: Needs Assistance Bed Mobility: Rolling;Sidelying to Sit;Sit to Sidelying Rolling: Min guard Sidelying to sit: Min guard     Sit to sidelying: Min assist General bed mobility comments: Min assist to lift LEs to EOB. Consistent verbal cues for sequencing, technique, and maintaining precautions.  Transfers Overall transfer level: Needs assistance Equipment used: Rolling walker (2 wheeled) Transfers: Sit to/from Stand Sit to Stand: Min guard         General transfer comment: Min guard for safety. VCs for hand placement.  Ambulation/Gait Ambulation/Gait assistance: Min assist Ambulation Distance (Feet): 60 Feet Assistive device: Rolling walker (2 wheeled) Gait Pattern/deviations: Step-to pattern;Decreased stride length;Shuffle;Drifts right/left Gait velocity: decreased Gait velocity interpretation: <1.8 ft/sec, indicative of risk for recurrent falls General Gait Details: patient slow and anxious with ambulation. Kept closing eyes and moaning throughout activity.  Attempted to ecnourage relaxation. Patient non-receptive.   Stairs            Wheelchair Mobility    Modified Rankin (Stroke Patients Only)       Balance Overall balance assessment: Needs assistance Sitting-balance support: Feet supported;No upper extremity supported Sitting balance-Leahy Scale: Fair     Standing balance support: Bilateral upper extremity supported Standing balance-Leahy Scale: Poor Standing balance comment: RW for support                             Pertinent Vitals/Pain Pain Assessment: 0-10 Pain Score: 8  Pain Location: head and back Pain Descriptors / Indicators: Aching;Grimacing;Guarding;Moaning;Operative site guarding Pain Intervention(s): Limited activity within patient's tolerance;Monitored during session;Premedicated before session;Repositioned    Home Living Family/patient expects to be discharged to:: Private residence Living Arrangements: Children;Other relatives Available Help at Discharge: Family;Available 24 hours/day Type of Home: House Home Access: Stairs to enter Entrance Stairs-Rails: Doctor, general practice of Steps: 4 Home Layout: One level Home Equipment: Walker - 2 wheels;Walker - 4 wheels;Cane - single point;Bedside commode;Wheelchair - manual;Other (comment) (Leg loop)      Prior Function Level of Independence: Needs assistance   Gait / Transfers Assistance Needed: Patient reports using a RW in the house with family supervision.  And that her family move her around the house by sliding her on a chair.  Reports needing assist to move sit > stand, especially from toilet.  Reports she uses her w/c for mobility outside of the house.  ADL's / Homemaking Assistance Needed: Pt initially states she does all self care mod I, except on her bad days, but upon further questioning, she states she requires assist for LB ADLs - family has to help her don/doff socks, pants, etc.  Hand Dominance   Dominant  Hand: Right    Extremity/Trunk Assessment   Upper Extremity Assessment: Overall WFL for tasks assessed           Lower Extremity Assessment: Generalized weakness (hx of peripheral neuropathy)      Cervical / Trunk Assessment: Other exceptions  Communication   Communication: No difficulties  Cognition Arousal/Alertness: Awake/alert Behavior During Therapy: Anxious Overall Cognitive Status: No family/caregiver present to determine baseline cognitive functioning                      General Comments General comments (skin integrity, edema, etc.): educated extensively regarding compliance with precautions    Exercises        Assessment/Plan    PT Assessment Patient needs continued PT services  PT Diagnosis Difficulty walking;Abnormality of gait;Generalized weakness;Acute pain   PT Problem List Decreased strength;Decreased activity tolerance;Decreased balance;Decreased mobility;Decreased coordination;Decreased knowledge of use of DME;Decreased safety awareness;Decreased knowledge of precautions;Pain  PT Treatment Interventions DME instruction;Gait training;Stair training;Functional mobility training;Therapeutic activities;Therapeutic exercise;Balance training;Patient/family education   PT Goals (Current goals can be found in the Care Plan section) Acute Rehab PT Goals Patient Stated Goal: get better PT Goal Formulation: With patient Time For Goal Achievement: 05/24/16 Potential to Achieve Goals: Good    Frequency Min 5X/week   Barriers to discharge        Co-evaluation PT/OT/SLP Co-Evaluation/Treatment: Yes Reason for Co-Treatment: For patient/therapist safety PT goals addressed during session: Mobility/safety with mobility OT goals addressed during session: ADL's and self-care       End of Session Equipment Utilized During Treatment: Gait belt;Back brace Activity Tolerance: Patient limited by pain Patient left: in bed;with call bell/phone within  reach;with bed alarm set Nurse Communication: Mobility status         Time: 0910-0930 PT Time Calculation (min) (ACUTE ONLY): 20 min   Charges:   PT Evaluation $PT Eval Moderate Complexity: 1 Procedure     PT G CodesFabio Asa 05-20-16, 10:22 AM Charlotte Crumb, PT DPT  [phone]

## 2016-05-10 NOTE — Evaluation (Signed)
Occupational Therapy Evaluation Patient Details Name: Carla [name] MRN: [id number] DOB: 08/19/1951 Today's Date: 05/10/2016    History of Present Illness Patient is 65 yo female s/p :Posterior Lumbar Interbody Fusion- Lumbar Four- Lumbar Five.    Clinical Impression   Pt reports she required assist with ADL PTA. Currently pt overall min guard for functional mobility and requires max assist for ADL. Began back, safety, and ADL education with pt. Pt with difficulty maintaining back precautions during functional activities despite education and max verbal reminders. Recommending SNF for follow up in order to maximize independence and safety with ADL and functional mobility prior to return home. Pt would benefit from continued skilled OT to address established goals.    Follow Up Recommendations  SNF;Supervision/Assistance - 24 hour    Equipment Recommendations  Other (comment) (TBD)    Recommendations for Other Services       Precautions / Restrictions Precautions Precautions: Back;Fall Precaution Comments: verbally reviewed with patient Required Braces or Orthoses: Spinal Brace Restrictions Weight Bearing Restrictions: No      Mobility Bed Mobility Overal bed mobility: Needs Assistance Bed Mobility: Rolling;Sidelying to Sit;Sit to Sidelying Rolling: Min guard Sidelying to sit: Min guard     Sit to sidelying: Min assist General bed mobility comments: Min assist to lift LEs to EOB. Consistent verbal cues for sequencing, technique, and maintaining precautions.  Transfers Overall transfer level: Needs assistance Equipment used: Rolling walker (2 wheeled) Transfers: Sit to/from Stand Sit to Stand: Min guard         General transfer comment: Min guard for safety. VCs for hand placement.    Balance Overall balance assessment: Needs assistance Sitting-balance support: Feet supported;No upper extremity supported Sitting balance-Leahy Scale: Fair     Standing  balance support: Bilateral upper extremity supported Standing balance-Leahy Scale: Poor Standing balance comment: RW for support                            ADL Overall ADL's : Needs assistance/impaired Eating/Feeding: Set up;Sitting   Grooming: Min guard;Standing   Upper Body Bathing: Minimal assitance;Sitting   Lower Body Bathing: Maximal assistance;Sit to/from stand   Upper Body Dressing : Maximal assistance;Sitting Upper Body Dressing Details (indicate cue type and reason): to don brace Lower Body Dressing: Maximal assistance;Sit to/from stand   Toilet Transfer: Min guard;Ambulation;BSC;RW Toilet Transfer Details (indicate cue type and reason): Simulated by sit to stand from EOB and functional mobility in room. Toileting- Clothing Manipulation and Hygiene: Maximal assistance;Sit to/from stand       Functional mobility during ADLs: Min guard;Rolling walker General ADL Comments: Eudcated pt on back precautions and log roll technique for bed mobility. Pt with difficulty maintinaing back precautions during functional activities requiring VCs throughout.     Vision Additional Comments: Pt c/o dizziness, difficulty keeping eyes open during functional mobility.   Perception     Praxis      Pertinent Vitals/Pain Pain Assessment: 0-10 Pain Score: 8  Pain Location: head and back Pain Descriptors / Indicators: Aching;Grimacing;Guarding;Moaning;Operative site guarding Pain Intervention(s): Limited activity within patient's tolerance;Monitored during session;Premedicated before session;Repositioned     Hand Dominance Right   Extremity/Trunk Assessment Upper Extremity Assessment Upper Extremity Assessment: Overall WFL for tasks assessed   Lower Extremity Assessment Lower Extremity Assessment: Defer to PT evaluation   Cervical / Trunk Assessment Cervical / Trunk Assessment: Other exceptions Cervical / Trunk Exceptions: s/p lumbar sx   Communication  Communication Communication: No difficulties  Cognition Arousal/Alertness: Awake/alert Behavior During Therapy: Anxious Overall Cognitive Status: No family/caregiver present to determine baseline cognitive functioning                     General Comments       Exercises       Shoulder Instructions      Home Living Family/patient expects to be discharged to:: Private residence Living Arrangements: Children;Other relatives Available Help at Discharge: Family;Available 24 hours/day Type of Home: House Home Access: Stairs to enter [employer] of Steps: 4 Entrance Stairs-Rails: Right;Left Home Layout: One level     Bathroom Shower/Tub: Producer, television/film/video: Standard     Home Equipment: Environmental consultant - 2 wheels;Walker - 4 wheels;Cane - single point;Bedside commode;Wheelchair - manual;Other (comment) (Leg loop)          Prior Functioning/Environment Level of Independence: Needs assistance  Gait / Transfers Assistance Needed: Patient reports using a RW in the house with family supervision.  And that her family move her around the house by sliding her on a chair.  Reports needing assist to move sit > stand, especially from toilet.  Reports she uses her w/c for mobility outside of the house. ADL's / Homemaking Assistance Needed: Pt initially states she does all self care mod I, except on her bad days, but upon further questioning, she states she requires assist for LB ADLs - family has to help her don/doff socks, pants, etc.         OT Diagnosis: Generalized weakness;Acute pain   OT Problem List: Decreased strength;Decreased activity tolerance;Impaired balance (sitting and/or standing);Decreased safety awareness;Decreased knowledge of use of DME or AE;Decreased knowledge of precautions;Obesity;Pain   OT Treatment/Interventions: Self-care/ADL training;Energy conservation;DME and/or AE instruction;Therapeutic activities;Patient/family education;Balance  training    OT Goals(Current goals can be found in the care plan section) Acute Rehab OT Goals Patient Stated Goal: get better OT Goal Formulation: With patient Time For Goal Achievement: 05/24/16 Potential to Achieve Goals: Good ADL Goals Pt Will Perform Grooming: with supervision;standing Pt Will Perform Lower Body Bathing: with supervision;with adaptive equipment;sit to/from stand Pt Will Perform Lower Body Dressing: with supervision;with adaptive equipment;sit to/from stand Pt Will Transfer to Toilet: with supervision;ambulating;bedside commode Pt Will Perform Toileting - Clothing Manipulation and hygiene: with supervision;sit to/from stand (with or without AE) Additional ADL Goal #1: Pt will independently verbally recall 3/3 back precautions and maintain throughout ADL.  Additional ADL Goal #2: Pt will don/doff back brace with setup as precursor for ADL and functional mobility.  OT Frequency: Min 2X/week   Barriers to D/C:            Co-evaluation PT/OT/SLP Co-Evaluation/Treatment: Yes Reason for Co-Treatment: For patient/therapist safety PT goals addressed during session: Mobility/safety with mobility OT goals addressed during session: ADL's and self-care      End of Session Equipment Utilized During Treatment: Gait belt;Rolling walker;Back brace;Oxygen Nurse Communication: Mobility status (RN observed mobility)  Activity Tolerance: Patient tolerated treatment well Patient left: in bed;with call bell/phone within reach;with bed alarm set   Time: 6301-6010 OT Time Calculation (min): 32 min Charges:  OT General Charges $OT Visit: 1 Procedure OT Evaluation $OT Eval Moderate Complexity: 1 Procedure G-Codes:     Gaye Alken M.S., OTR/L Pager: [phone]  05/10/2016, 9:41 AM

## 2016-05-11 NOTE — Progress Notes (Signed)
No issues overnight. Pt has appropriate back pain. Ambulating with PT.  EXAM:  BP (!) 107/55 (BP Location: Right Arm)   Pulse 71   Temp 98 F (36.7 C) (Oral)   Resp 20   Ht 5\' 4"  (1.626 m)   Wt 93.9 kg (207 lb)   SpO2 96%   BMI 35.53 kg/m   Awake, alert, oriented  Speech fluent, appropriate  CN grossly intact  Good strength   IMPRESSION:  65 y.o. female POD#2 s/p lumbar fusion, progressing slowly.  PLAN: - Will likely need SNF placement - Cont PT/OT

## 2016-05-11 NOTE — Progress Notes (Signed)
Physical Therapy Treatment Patient Details Name: Carla [name] MRN: [id number] DOB: January 12, 1951 Today's Date: 05/11/2016    History of Present Illness Patient is 65 yo female s/p :Posterior Lumbar Interbody Fusion- Lumbar Four- Lumbar Five.     PT Comments    Pt is making steady progress toward goals. Acute PT to continue.  Follow Up Recommendations  SNF;Supervision/Assistance - 24 hour     Equipment Recommendations  None recommended by PT    Precautions / Restrictions Precautions Precautions: Back;Fall Precaution Comments: pt unable to recall precautions. reviewed all with pt today. Required Braces or Orthoses: Spinal Brace Spinal Brace: Lumbar corset;Applied in sitting position Restrictions Weight Bearing Restrictions: No    Mobility  Bed Mobility Overal bed mobility: Needs Assistance     Sidelying to sit: Min assist     Sit to sidelying: Min assist General bed mobility comments: with bed flat and rail used. cues on sequencing and technique to adhere to back precautions. increased time needed. most assistance needed with LE management.  Transfers Overall transfer level: Needs assistance Equipment used: Rolling walker (2 wheeled) Transfers: Sit to/from Stand Sit to Stand: Min guard         General transfer comment: cues on hand placement and weight shifting.  Ambulation/Gait Ambulation/Gait assistance: Min guard;Min assist Ambulation Distance (Feet): 130 Feet Assistive device: Rolling walker (2 wheeled) Gait Pattern/deviations: Step-through pattern;Decreased stride length;Shuffle   Gait velocity interpretation: Below normal speed for age/gender General Gait Details: cues to increase step length and foot clearance needed. one episode of dizziness in hallway with pt needing cues to keep eyes open and fix gaze, min assist for balance at this time. otherwise pt was min guard assist. continues to have a slow, guarded cadence.       Cognition  Arousal/Alertness: Awake/alert Behavior During Therapy: WFL for tasks assessed/performed Overall Cognitive Status: No family/caregiver present to determine baseline cognitive functioning       Memory: Decreased recall of precautions;Decreased short-term memory               Pertinent Vitals/Pain Pain Assessment: 0-10 Pain Score: 8  Pain Location: back and hips Pain Descriptors / Indicators: Aching;Moaning;Grimacing;Guarding Pain Intervention(s): Limited activity within patient's tolerance;Monitored during session;Premedicated before session;Repositioned;Patient requesting pain meds-RN notified     PT Goals (current goals can now be found in the care plan section) Acute Rehab PT Goals Patient Stated Goal: get better PT Goal Formulation: With patient Time For Goal Achievement: 05/24/16 Potential to Achieve Goals: Good Progress towards PT goals: Progressing toward goals    Frequency  Min 5X/week    PT Plan Current plan remains appropriate    End of Session Equipment Utilized During Treatment: Gait belt;Back brace Activity Tolerance: Patient tolerated treatment well Patient left: in bed;with call bell/phone within reach;with bed alarm set     Time: 1050-1115 PT Time Calculation (min) (ACUTE ONLY): 25 min  Charges:  $Gait Training: 8-22 mins $Therapeutic Activity: 8-22 mins           Sallyanne Kuster 05/11/2016, 12:32 PM   Sallyanne Kuster, PTA, CLT Acute Rehab Services [phone] 05/11/16, 12:33 PM
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
## 2016-05-12 MED FILL — Sodium Chloride IV Soln 0.9%: INTRAVENOUS | Qty: 1000 | Status: AC

## 2016-05-12 MED FILL — Heparin Sodium (Porcine) Inj 1000 Unit/ML: INTRAMUSCULAR | Qty: 30 | Status: AC

## 2016-05-12 NOTE — Progress Notes (Signed)
Postop day 3. Overall patient doing reasonably well. Patient complains of the appropriate amount of soreness. Patient is making progress with physical therapy although I still think that home discharge is unlikely. Probably will need SNIF placement.  Awake and alert. Afebrile. Vital signs are stable. Motor and sensory examination intact. Abdomen soft. Wound clean and dry.  Progressing slowly. Continue rehabilitation efforts.

## 2016-05-12 NOTE — Progress Notes (Signed)
Physical Therapy Treatment Patient Details Name: Carla [name] MRN: [id number] DOB: January 02, 1951 Today's Date: 05/12/2016    History of Present Illness Patient is 65 yo female s/p :Posterior Lumbar Interbody Fusion- Lumbar Four- Lumbar Five.     PT Comments    Pt not progressing with ambulation distance today due to increased pain and dizziness. Ambulated 79' with RW and min A with bilateral knee weakness end of ambulation that led to sitting. Pt has tolerated sitting EOB for more than an hour this morning and is improving with bed mobility. PT will continue to follow.   Follow Up Recommendations  SNF;Supervision/Assistance - 24 hour     Equipment Recommendations  None recommended by PT    Recommendations for Other Services       Precautions / Restrictions Precautions Precautions: Back;Fall Precaution Comments: pt unable to recal precautions, reviewed with her and she verbalized understanding Required Braces or Orthoses: Spinal Brace Spinal Brace: Lumbar corset;Applied in sitting position Restrictions Weight Bearing Restrictions: No    Mobility  Bed Mobility Overal bed mobility: Needs Assistance Bed Mobility: Sit to Sidelying         Sit to sidelying: Min assist General bed mobility comments: min A for LE's back into bed and positioning in SL to decreased pain  Transfers Overall transfer level: Needs assistance Equipment used: Rolling walker (2 wheeled) Transfers: Sit to/from [employer] Sit to Stand: Min guard         General transfer comment: pt stood from bed and recliner multiple times without requiring physical assist but min-guard for safety as pt moving very slowly and BP has been low with subsequent dizziness.   Ambulation/Gait Ambulation/Gait assistance: Min guard;Min assist Ambulation Distance (Feet): 65 Feet Assistive device: Rolling walker (2 wheeled) Gait Pattern/deviations: Decreased stride length;Shuffle;Step-to pattern Gait  velocity: decreased Gait velocity interpretation: Below normal speed for age/gender General Gait Details: vc's for step through pattern and pt able to do this, still with very short step length. At 49' pt began to have increased pain and giving way of bilateral knees, she did not want to sit but was encouraged to do so for safety. Pt tearful because of pain. Bilateral knee weakness continued with SPT back to bed.    Stairs            Wheelchair Mobility    Modified Rankin (Stroke Patients Only)       Balance Overall balance assessment: Needs assistance Sitting-balance support: Feet supported;No upper extremity supported Sitting balance-Leahy Scale: Good Sitting balance - Comments: pt sitting EOB for meals and able to don brace independently in sitting   Standing balance support: Bilateral upper extremity supported Standing balance-Leahy Scale: Poor Standing balance comment: dependent on RW in standing                    Cognition Arousal/Alertness: Awake/alert Behavior During Therapy: WFL for tasks assessed/performed Overall Cognitive Status: No family/caregiver present to determine baseline cognitive functioning       Memory: Decreased recall of precautions;Decreased short-term memory              Exercises      General Comments General comments (skin integrity, edema, etc.): BP before ambulation 117/58. Pt dizzy after ambulation and BP taken in SL and was 159/130 but pt was moving arm and do not feel this reading was correct. Next reading was 111/44.      Pertinent Vitals/Pain Pain Assessment: Faces Faces Pain Scale: Hurts worst Pain Location: back  after ambulating Pain Descriptors / Indicators: Moaning;Aching;Crying Pain Intervention(s): Patient requesting pain meds-RN notified;Monitored during session;Limited activity within patient's tolerance    Home Living                      Prior Function            PT Goals (current goals can  now be found in the care plan section) Acute Rehab PT Goals Patient Stated Goal: get better PT Goal Formulation: With patient Time For Goal Achievement: 05/24/16 Potential to Achieve Goals: Good Progress towards PT goals: Not progressing toward goals - comment (increased pain and dizziness)    Frequency  Min 5X/week    PT Plan Current plan remains appropriate    Co-evaluation             End of Session Equipment Utilized During Treatment: Gait belt;Back brace Activity Tolerance: Patient limited by pain;Other (comment) (limited by dizziness) Patient left: in bed;with bed alarm set;with nursing/sitter in room;with call bell/phone within reach     Time: 1142-1208 PT Time Calculation (min) (ACUTE ONLY): 26 min  Charges:  $Gait Training: 8-22 mins $Therapeutic Activity: 8-22 mins                    G Codes:     Lyanne Co, PT  Acute Rehab Services  [phone]  Pecola Leisure, [place] 05/12/2016, 1:20 PM

## 2016-05-12 NOTE — Care Management Important Message (Signed)
Important Message  Patient Details  Name: Carla [name] MRN: [id number] Date of Birth: Mar 04, 1951   Medicare Important Message Given:  Yes    Bernadette Hoit 05/12/2016, 3:32 PM

## 2016-05-12 NOTE — Care Management Note (Signed)
Case Management Note  Patient Details  Name: Carla [name] MRN: [id number] Date of Birth: 10-Feb-1951  Subjective/Objective:                    Action/Plan: Plan is SNF at discharge. CM following for d/c needs.   Expected Discharge Date:                  Expected Discharge Plan:  Skilled Nursing Facility  In-House Referral:     Discharge planning Services     Post Acute Care Choice:    Choice offered to:     DME Arranged:    DME Agency:     HH Arranged:    HH Agency:     Status of Service:  In process, will continue to follow  If discussed at Long Length of Stay Meetings, dates discussed:    Additional Comments:  Kermit Balo, RN 05/12/2016, 3:57 PM

## 2016-05-12 NOTE — Progress Notes (Signed)
Pt states she hasn't had a BM since admission.  Will order prune juice for breakfast for her and ask following nurse to follow up with pt and call MD if needed

## 2016-05-12 NOTE — NC FL2 (Signed)
[place] MEDICAID FL2 LEVEL OF CARE SCREENING TOOL     IDENTIFICATION  Patient Name: Carla [name] Birthdate: 08/03/1951 Sex: female Admission Date (Current Location): 05/09/2016  [facility] and [place] Number:  [employer] and Address:  [facility], [address] [place], [place] [address]      Provider Number: [id number]  Attending Physician Name and Address:  Julio Sicks, MD  Relative Name and Phone Number:       Current Level of Care: Hospital Recommended Level of Care: Skilled Nursing Facility Prior Approval Number:    Date Approved/Denied:   PASRR Number: [id number]  Discharge Plan: SNF    Current Diagnoses: Patient Active Problem List   Diagnosis Date Noted  . Degenerative spondylolisthesis 05/09/2016  . Intractable nausea and vomiting 03/10/2016  . Abdominal pain 03/10/2016  . Syncope and collapse 03/08/2016  . Abnormal thyroid function test 03/08/2016  . Chest pain, unspecified 03/07/2016  . Epigastric abdominal pain 03/07/2016  . Cholecystitis, acute 03/07/2016  . Chronic pain syndrome 03/07/2016  . Prediabetes 03/07/2016  . Obesity, morbid (HCC) 03/07/2016  . Cholelithiasis 03/07/2016  . Elevated troponin 12/21/2015  . Fall 12/21/2015  . 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 Bilateral leg weakness   . Erythrocytosis   . Essential hypertension 06/29/2015  . 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 Anxiety attack 03/08/2012  . COPD (chronic obstructive pulmonary disease) (HCC) 03/05/2012    Orientation RESPIRATION BLADDER Height & Weight     Self, Situation, Place  Normal Continent Weight: 93.9 kg (207 lb) Height:   (162.6 cm)  BEHAVIORAL SYMPTOMS/MOOD NEUROLOGICAL BOWEL NUTRITION STATUS      Continent Diet (see DC summary)  AMBULATORY STATUS COMMUNICATION OF NEEDS Skin   Limited Assist Verbally Surgical wounds                       Personal Care  Assistance Level of Assistance  Bathing, Dressing Bathing Assistance: Limited assistance   Dressing Assistance: Limited assistance     Functional Limitations Info             SPECIAL CARE FACTORS FREQUENCY  PT (By licensed PT), OT (By licensed OT)     PT Frequency: 5/wk OT Frequency: 5/wk            Contractures      Additional Factors Info  Code Status Code Status Info: FULL             Current Medications (05/12/2016):  This is the current hospital active medication list Current Facility-Administered Medications  Medication Dose Route Frequency Provider Last Rate Last Dose  . acetaminophen (TYLENOL) tablet 650 mg  650 mg Oral Q4H PRN Julio Sicks, MD       Or  . acetaminophen (TYLENOL) suppository 650 mg  650 mg Rectal Q4H PRN Julio Sicks, MD      . albuterol (PROVENTIL) (2.5 MG/3ML) 0.083% nebulizer solution 2.5 mg  2.5 mg Inhalation Q6H PRN Julio Sicks, MD      . amLODipine (NORVASC) tablet 5 mg  5 mg Oral Daily Julio Sicks, MD   5 mg at 05/11/16 0930  . cholecalciferol (VITAMIN D) tablet 2,000 Units  2,000 Units Oral Daily Julio Sicks, MD   2,000 Units at 05/12/16 [phone]  . cyclobenzaprine (FLEXERIL) tablet 10 mg  10 mg Oral TID PRN Julio Sicks, MD   10 mg at 05/10/16 2139  . diazepam (VALIUM) tablet 5 mg  5 mg Oral TID PRN Julio Sicks, MD      . diazepam (VALIUM) tablet 5-10 mg  5-10 mg Oral Q6H PRN Julio Sicks, MD   5 mg at 05/12/16 0152  . dimenhyDRINATE (DRAMAMINE) tablet 50 mg  50 mg Oral Q8H PRN Julio Sicks, MD      . DULoxetine (CYMBALTA) DR capsule 60 mg  60 mg Oral Daily Julio Sicks, MD   60 mg at 05/12/16 0925  . gabapentin (NEURONTIN) capsule 300 mg  300 mg Oral TID Julio Sicks, MD   300 mg at 05/12/16 0925  . HYDROcodone-acetaminophen (NORCO/VICODIN) 5-325 MG per tablet 1-2 tablet  1-2 tablet Oral Q4H PRN Julio Sicks, MD   2 tablet at 05/12/16 1235  . HYDROmorphone (DILAUDID) injection 0.5-1 mg  0.5-1 mg Intravenous Q2H PRN Julio Sicks, MD   1 mg at 05/11/16 2010  .  hydrOXYzine (ATARAX/VISTARIL) tablet 25 mg  25 mg Oral QHS Julio Sicks, MD   25 mg at 05/11/16 2012  . menthol-cetylpyridinium (CEPACOL) lozenge 3 mg  1 lozenge Oral PRN Julio Sicks, MD       Or  . phenol (CHLORASEPTIC) mouth spray 1 spray  1 spray Mouth/Throat PRN Julio Sicks, MD      . metoprolol succinate (TOPROL-XL) 24 hr tablet 50 mg  50 mg Oral QHS Julio Sicks, MD   50 mg at 05/11/16 2013  . naloxone [facility]) injection 0.4 mg  0.4 mg Intramuscular Once PRN Julio Sicks, MD      . ondansetron [facility]) injection 4 mg  4 mg Intravenous Q4H PRN Julio Sicks, MD      . ondansetron (ZOFRAN-ODT) disintegrating tablet 8 mg  8 mg Oral Q8H PRN Julio Sicks, MD   8 mg at 05/10/16 1438  . oxyCODONE-acetaminophen (PERCOCET/ROXICET) 5-325 MG per tablet 1 tablet  1 tablet Oral Q4H PRN Julio Sicks, MD   1 tablet at 05/12/16 0556   And  . oxyCODONE (Oxy IR/ROXICODONE) immediate release tablet 5 mg  5 mg Oral Q4H PRN Julio Sicks, MD   5 mg at 05/11/16 0930  . pantoprazole (PROTONIX) EC tablet 40 mg  40 mg Oral Daily Julio Sicks, MD   40 mg at 05/12/16 9323     Discharge Medications: Please see discharge summary for a list of discharge medications.  Relevant Imaging Results:  Relevant Lab Results:   Additional Information SS#: [id number]  Izora Ribas, [place]

## 2016-05-12 NOTE — Clinical Social Work Note (Signed)
Clinical Social Work Assessment  Patient Details  Name: Carla [name] MRN: [id number] Date of Birth: Sep 16, 1951  Date of referral:  05/12/16               Reason for consult:  Facility Placement                Permission sought to share information with:  Facility Industrial/product designer granted to share information::  Yes, Verbal Permission Granted  Name::        Agency::  SNFs  Relationship::     Contact Information:     Housing/Transportation Living arrangements for the past 2 months:  Single Family Home Source of Information:  Patient Patient Interpreter Needed:  None Criminal Activity/Legal Involvement Pertinent to Current Situation/Hospitalization:  No - Comment as needed Significant Relationships:  Adult Children Lives with:  Adult Children Do you feel safe going back to the place where you live?  Yes Need for family participation in patient care:  No (Coment)  Care giving concerns:  Pt lives at home with MR dtr and another adult dtr- pt does not feel as if they can offer physical assistance at this time   Office manager / plan:  CSW spoke with pt concerning plan for time of DC.  Pt reports that she has felt weak with PT and understands their recommendation for SNF stay.  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  Employment status:    Insurance information:  Medicare PT Recommendations:  Skilled Nursing Facility Information / Referral to community resources:  Skilled Nursing Facility  Patient/Family's Response to care:  Pt is agreeable to short term SNF stay and would like to stay in [place] if possible.  Patient/Family's Understanding of and Emotional  Response to Diagnosis, Current Treatment, and Prognosis:  Pt seems to have good understanding of diagnosis/treatment plan and is hopeful she will feel more like herself soon.  Emotional Assessment Appearance:  Appears stated age Attitude/Demeanor/Rapport:    Affect (typically observed):  Appropriate, Accepting Orientation:  Oriented to Self, Oriented to Place, Oriented to  Time, Oriented to Situation Alcohol / Substance use:  Not Applicable Psych involvement (Current and /or in the community):  No (Comment)  Discharge Needs  Concerns to be addressed:  Care Coordination Readmission within the last 30 days:  No Current discharge risk:  Lack of support system, Physical Impairment Barriers to Discharge:  Continued Medical Work up   [employer], [place], LCSW 05/12/2016, 1:22 PM

## 2016-05-12 NOTE — Progress Notes (Signed)
Occupational Therapy Treatment Patient Details Name: Carla [name] MRN: [id number] DOB: [birth year] Today's Date: 05/12/2016    History of present illness Patient is 65 yo female s/p :Posterior Lumbar Interbody Fusion- Lumbar Four- Lumbar Five.    OT comments  Pt is able to don brace MOD I this session and progress from EOB to chair. Pt positioned with lunch and self feeding. RN called and notified to help patient back to bed after completion of lunch.    Follow Up Recommendations  SNF;Supervision/Assistance - 24 hour    Equipment Recommendations  Other (comment) (defer to [facility])    Recommendations for Other Services      Precautions / Restrictions Precautions Precautions: Back;Fall Precaution Comments: back handout provided and reviewed in detail. pt able to recall precautions prior to issued handout Required Braces or Orthoses: Spinal Brace Spinal Brace: Lumbar corset;Applied in sitting position Restrictions Weight Bearing Restrictions: No       Mobility Bed Mobility Overal bed mobility: Needs Assistance Bed Mobility: Supine to Sit     Supine to sit: Min guard   Sit to sidelying: Min assist General bed mobility comments: excellent return demo of log rolling  Transfers Overall transfer level: Needs assistance Equipment used: Rolling walker (2 wheeled) Transfers: Sit to/from [facility] Sit to Stand: Min assist Stand pivot transfers: Min guard       General transfer comment: pt stood from bed and recliner multiple times without requiring physical assist but min-guard for safety as pt moving very slowly and BP has been low with subsequent dizziness.     Balance Overall balance assessment: Needs assistance Sitting-balance support: Bilateral upper extremity supported;Feet supported Sitting balance-Leahy Scale: Good Sitting balance - Comments: pt sitting EOB for meals and able to don brace independently in sitting   Standing balance support:  Bilateral upper extremity supported;During functional activity Standing balance-Leahy Scale: Poor Standing balance comment: dependent on RW in standing                   ADL Overall ADL's : Needs assistance/impaired Eating/Feeding: Independent;Sitting   Grooming: Wash/dry face;Modified independent;Sitting                   Toilet Transfer: Minimal assistance;RW;Stand-pivot Toilet Transfer Details (indicate cue type and reason): simulated eob to chair. pt with good handplacement and incr time to complete transfer           General ADL Comments: Pt able to recall precautions and educated on precautions with adls. pt with no recall of lifting restrictions. pt agreeable to SNF placement       Vision                     Perception     Praxis      Cognition   Behavior During Therapy: [facility] for tasks assessed/performed Overall Cognitive Status: Impaired/Different from baseline Area of Impairment: Safety/judgement;Awareness     Memory: Decreased short-term memory    Safety/Judgement: Decreased awareness of deficits;Decreased awareness of safety Awareness: Anticipatory   General Comments: pt reports baseline STM deficits     Extremity/Trunk Assessment               Exercises     Shoulder Instructions       General Comments      Pertinent Vitals/ Pain       Pain Assessment: Faces Faces Pain Scale: Hurts even more Pain Location: back Pain Descriptors / Indicators: Constant;Operative site guarding Pain Intervention(s):  Limited activity within patient's tolerance;Repositioned;Premedicated before session;Monitored during session  Home Living                                          Prior Functioning/Environment              Frequency Min 2X/week     Progress Toward Goals  OT Goals(current goals can now be found in the care plan section)  Progress towards OT goals: Progressing toward goals  Acute Rehab OT  Goals Patient Stated Goal: to be able to do it right OT Goal Formulation: With patient Time For Goal Achievement: 05/24/16 Potential to Achieve Goals: Good ADL Goals Pt Will Perform Grooming: with supervision;standing Pt Will Perform Lower Body Bathing: with supervision;with adaptive equipment;sit to/from stand Pt Will Perform Lower Body Dressing: with supervision;with adaptive equipment;sit to/from stand Pt Will Transfer to Toilet: with supervision;ambulating;bedside commode Pt Will Perform Toileting - Clothing Manipulation and hygiene: with supervision;sit to/from stand Additional ADL Goal #1: Pt will independently verbally recall 3/3 back precautions and maintain throughout ADL.  (met) Additional ADL Goal #2: met  Plan Discharge plan remains appropriate    Co-evaluation                 End of Session Equipment Utilized During Treatment: Gait belt;Back brace;Rolling walker   Activity Tolerance Patient tolerated treatment well   Patient Left in chair;with call bell/phone within reach;with chair alarm set   Nurse Communication Mobility status;Precautions        Time: 4383-8184 OT Time Calculation (min): 18 min  Charges: OT General Charges $OT Visit: 1 Procedure OT Treatments $Self Care/Home Management : 8-22 mins  Parke Poisson B 05/12/2016, 3:13 PM   Carla [name]   OTR/L Pager: [phone] Office: [phone] .

## 2016-05-12 NOTE — Progress Notes (Signed)
Asked patient what year it was and she replied "2016", asked the month she said "June, in the first two weeks of June", asked what hospital she was at, she looked around the room and pointed at the sign on the wall and said"im here, [place]". Patient states she has been having difficulty with her memory since before christmas and that her PCP put her on something but stopped it because it wasn't helping. Will continue to monitor closely.

## 2016-05-13 HISTORY — PX: LUMBAR FUSION: SHX111

## 2016-05-13 MED ORDER — DIAZEPAM 5 MG PO TABS: 5.0000 mg | ORAL_TABLET | Freq: Three times a day (TID) | ORAL | 0 refills | Status: DC | PRN

## 2016-05-13 MED ORDER — OXYCODONE-ACETAMINOPHEN 10-325 MG PO TABS: 1.0000 | ORAL_TABLET | ORAL | 0 refills | Status: DC | PRN

## 2016-05-13 NOTE — Progress Notes (Signed)
Physical Therapy Treatment Patient Details Name: Carla [name] MRN: [id number] DOB: 1951-04-06 Today's Date: 05/13/2016    History of Present Illness Patient is 65 yo female s/p :Posterior Lumbar Interbody Fusion- Lumbar Four- Lumbar Five.     PT Comments    Patient is progressing toward mobility goals. Recalled 3/3 precautions beginning of session. Pt continues to demo cognitive impairments and appears confused at times needing cues to attend to tasks. Current plan remains appropriate.   Follow Up Recommendations  SNF;Supervision/Assistance - 24 hour     Equipment Recommendations  None recommended by PT    Recommendations for Other Services       Precautions / Restrictions Precautions Precautions: Back;Fall Precaution Comments: pt able to recall all back precautions beginning of session Required Braces or Orthoses: Spinal Brace Spinal Brace: Lumbar corset;Applied in sitting position Restrictions Weight Bearing Restrictions: No    Mobility  Bed Mobility Overal bed mobility: Needs Assistance Bed Mobility: Sidelying to Sit;Sit to Sidelying Rolling: Min guard Sidelying to sit: Min guard Supine to sit: Min guard   Sit to sidelying: Min guard General bed mobility comments: min guard for safety; carry over of sequencing and technique; maintained back precautions  Transfers Overall transfer level: Needs assistance Equipment used: Rolling walker (2 wheeled) Transfers: Sit to/from Stand Sit to Stand: Min guard         General transfer comment: cues for hand placement  Ambulation/Gait Ambulation/Gait assistance: Min guard Ambulation Distance (Feet): 100 Feet Assistive device: Rolling walker (2 wheeled) Gait Pattern/deviations: Step-through pattern;Decreased step length - left;Decreased stride length;Trunk flexed Gait velocity: decreased   General Gait Details: cues for cadence, bilat step length, and posture; safe use of AD   Stairs            Wheelchair  Mobility    Modified Rankin (Stroke Patients Only)       Balance Overall balance assessment: Needs assistance Sitting-balance support: Bilateral upper extremity supported;Feet supported Sitting balance-Leahy Scale: Good     Standing balance support: Bilateral upper extremity supported;During functional activity Standing balance-Leahy Scale: Poor                      Cognition Arousal/Alertness: Awake/alert Behavior During Therapy: WFL for tasks assessed/performed Overall Cognitive Status: Within Functional Limits for tasks assessed                 General Comments: pt reports "oh I thought someone else was in here. I go places in my head when I am in pain" Pt verbalized seeing things prior to surg and this was not new for her    Exercises      General Comments        Pertinent Vitals/Pain Pain Assessment: 0-10 Pain Score: 6  Faces Pain Scale: Hurts little more Pain Location: back Pain Descriptors / Indicators: Aching;Guarding;Sore Pain Intervention(s): Limited activity within patient's tolerance;Monitored during session;Repositioned;Patient requesting pain meds-RN notified    Home Living                      Prior Function            PT Goals (current goals can now be found in the care plan section) Acute Rehab PT Goals Patient Stated Goal: none stated PT Goal Formulation: With patient Time For Goal Achievement: 05/24/16 Potential to Achieve Goals: Good Progress towards PT goals: Progressing toward goals    Frequency  Min 5X/week    PT Plan Current plan remains  appropriate    Co-evaluation             End of Session Equipment Utilized During Treatment: Gait belt;Back brace Activity Tolerance: Patient tolerated treatment well Patient left: in bed;with bed alarm set;with nursing/sitter in room;with call bell/phone within reach     Time: 0913-0941 PT Time Calculation (min) (ACUTE ONLY): 28 min  Charges:  $Gait Training:  8-22 mins $Therapeutic Activity: 8-22 mins                    G Codes:      Derek [place], PTA Pager: [phone]   05/13/2016, 11:58 AM

## 2016-05-13 NOTE — Clinical Social Work Placement (Signed)
   CLINICAL SOCIAL WORK PLACEMENT  NOTE  Date:  05/13/2016  Patient Details  Name: Carla [name] MRN: [id number] Date of Birth: [birth year]  Clinical Social Work is seeking post-discharge placement for this patient at the Skilled  Nursing Facility level of care (*CSW will initial, date and re-position this form in  chart as items are completed):  Yes   Patient/family provided with [facility] Clinical Social Work Department's list of facilities offering this level of care within the geographic area requested by the patient (or if unable, by the patient's family).  Yes   Patient/family informed of their freedom to choose among providers that offer the needed level of care, that participate in Medicare, Medicaid or managed care program needed by the patient, have an available bed and are willing to accept the patient.  Yes   Patient/family informed of [facility]'s ownership interest in [facility] and [facility], as well as of the fact that they are under no obligation to receive care at these facilities.  PASRR submitted to EDS on 05/12/16     PASRR number received on 05/12/16     Existing PASRR number confirmed on       FL2 transmitted to all facilities in geographic area requested by pt/family on 05/12/16     FL2 transmitted to all facilities within larger geographic area on       Patient informed that his/her managed care company has contracts with or will negotiate with certain facilities, including the following:        Yes   Patient/family informed of bed offers received.  Patient [facility] bed at [facility]     Physician recommends and patient chooses bed at      Patient to be transferred to [facility] on 05/13/16.  Patient to be transferred to facility by Pt's daughter, [name]     Patient family notified on 05/13/16 of transfer.  Name of family member notified:  Pt's daughter, [name]. Pt spoke to her daughter.     PHYSICIAN        Additional Comment:    _______________________________________________ Dede Query, LCSW 05/13/2016, 11:59 AM

## 2016-05-13 NOTE — Progress Notes (Signed)
Occupational Therapy Treatment Patient Details Name: Carla [name] MRN: [id number] DOB: Sep 25, 1951 Today's Date: 05/13/2016    History of present illness Patient is 65 yo female s/p :Posterior Lumbar Interbody Fusion- Lumbar Four- Lumbar Five.    OT comments  Pt very pleasant and agreeable to OOB for AE education followed by lunch. Pt with good return demo of AE and continues to need SNF level care.pt requires bed rails and incr time to exit the bed.    Follow Up Recommendations  SNF;Supervision/Assistance - 24 hour    Equipment Recommendations  Other (comment) (defer to SNF)    Recommendations for Other Services      Precautions / Restrictions Precautions Precautions: Back;Fall Precaution Comments: pt able to recall all back precautions beginning of session Required Braces or Orthoses: Spinal Brace Spinal Brace: Lumbar corset;Applied in sitting position Restrictions Weight Bearing Restrictions: No       Mobility Bed Mobility Overal bed mobility: Needs Assistance Bed Mobility: Sidelying to Sit;Sit to Sidelying Rolling: Min guard Sidelying to sit: Min guard Supine to sit: Min guard   Sit to sidelying: Min guard General bed mobility comments: min guard for safety; carry over of sequencing and technique; maintained back precautions  Transfers Overall transfer level: Needs assistance Equipment used: Rolling walker (2 wheeled) Transfers: Sit to/from Stand Sit to Stand: Min guard         General transfer comment: cues for hand placement    Balance Overall balance assessment: Needs assistance Sitting-balance support: Bilateral upper extremity supported;Feet supported Sitting balance-Leahy Scale: Good     Standing balance support: Bilateral upper extremity supported;During functional activity Standing balance-Leahy Scale: Poor                     ADL Overall ADL's : Needs assistance/impaired Eating/Feeding: Independent   Grooming: Wash/dry hands;Set  up;Sitting Grooming Details (indicate cue type and reason): provided wash cloth for hand hygiene             Lower Body Dressing: Sit to/from stand;Min guard;With adaptive equipment Lower Body Dressing Details (indicate cue type and reason): pt with good return demo of sock aide and reacher Toilet Transfer: Min Administrator Details (indicate cue type and reason): simulated eob to chair          Functional mobility during ADLs: Min guard General ADL Comments: pt completed bed mobility to chair for LB AE education and then lunch arrival      Vision                     Perception     Praxis      Cognition   Behavior During Therapy: [facility] for tasks assessed/performed Overall Cognitive Status: Within Functional Limits for tasks assessed                  General Comments: pt reports "oh I thought someone else was in here. I go places in my head when I am in pain" Pt verbalized seeing things prior to surg and this was not new for her    Extremity/Trunk Assessment               Exercises     Shoulder Instructions       General Comments      Pertinent Vitals/ Pain       Pain Assessment: 0-10 Pain Score: 6  Faces Pain Scale: Hurts little more Pain Location: back Pain Descriptors / Indicators: Aching;Guarding;Sore Pain Intervention(s): Limited  activity within patient's tolerance;Monitored during session;Repositioned;Patient requesting pain meds-RN notified  Home Living                                          Prior Functioning/Environment              Frequency Min 2X/week     Progress Toward Goals  OT Goals(current goals can now be found in the care plan section)  Progress towards OT goals: Progressing toward goals  Acute Rehab OT Goals Patient Stated Goal: none stated OT Goal Formulation: With patient Time For Goal Achievement: 05/24/16 Potential to Achieve Goals: Good ADL Goals Pt Will Perform  Grooming: with supervision;standing Pt Will Perform Lower Body Bathing: with supervision;with adaptive equipment;sit to/from stand Pt Will Perform Lower Body Dressing: with supervision;with adaptive equipment;sit to/from stand Pt Will Transfer to Toilet: with supervision;ambulating;bedside commode Pt Will Perform Toileting - Clothing Manipulation and hygiene: with supervision;sit to/from stand Additional ADL Goal #1: Pt will independently verbally recall 3/3 back precautions and maintain throughout ADL.   Plan Discharge plan remains appropriate    Co-evaluation                 End of Session Equipment Utilized During Treatment: Back brace;Rolling walker   Activity Tolerance Patient tolerated treatment well   Patient Left in chair;with call bell/phone within reach;with chair alarm set   Nurse Communication Mobility status;Precautions        Time: 1610-9604 OT Time Calculation (min): 19 min  Charges: OT General Charges $OT Visit: 1 Procedure OT Treatments $Self Care/Home Management : 8-22 mins  Boone Master B 05/13/2016, 11:58 AM   Mateo Flow   OTR/L Pager: [phone] Office: [phone] .

## 2016-05-13 NOTE — Care Management Note (Signed)
Case Management Note  Patient Details  Name: Carla [name] MRN: [id number] Date of Birth: 10-04-1951  Subjective/Objective:                    Action/Plan: Pt discharging to [facility] today. No further needs per CM.   Expected Discharge Date:                  Expected Discharge Plan:  Skilled Nursing Facility  In-House Referral:  Clinical Social Work  Discharge planning Services  CM Consult  Post Acute Care Choice:    Choice offered to:     DME Arranged:    DME Agency:     HH Arranged:    HH Agency:     Status of Service:  Completed, signed off  If discussed at [employer], dates discussed:    Additional Comments:  Kermit Balo, RN 05/13/2016, 2:06 PM

## 2016-05-13 NOTE — Progress Notes (Signed)
Post op day 4.  Pain well controlled. No LE sxns.  Making progress with PT  VSSAF wd CDI. Motor and sens stable. Chest and abdomen benign.  Overall progressing well. Deconditioning and patient's preexistent overall decline make need for further convalescence and therapy in a nursing home environment necessary. Okay for transfer when bed available.

## 2016-05-13 NOTE — Progress Notes (Signed)
Pt for discharge to [facility] in [place] today. Discharge orders received. IV  dcd with dressing clean dry and intact. Discharge instructions and prescriptions given with verbalized understanding. Family at bedside to assist with discharge. Staff brought patient to lobby via wheelchair at this time. Transported to home by family member.

## 2016-05-13 NOTE — Discharge Instructions (Signed)

## 2016-05-13 NOTE — Discharge Summary (Signed)
Physician Discharge Summary  Patient ID: Carla [name] MRN: [id number] DOB/AGE: Jun 29, 1951 65 y.o.  Admit date: 05/09/2016 Discharge date: 05/13/2016  Admission Diagnoses:  Discharge Diagnoses:  Active Problems:   Degenerative spondylolisthesis   Discharged Condition: fair  Hospital Course: Patient admitted to the hospital where she underwent an uncomplicated L4-5 decompression and fusion for treatment of her symptomatic lumbar stenosis. Postoperative she is progressing reasonably well. Preoperative back and lower extremity pain are much improved. The patient has been very deconditioned preoperatively and has had superimposed mild cognitive decline. Plan is for discharge to skilled nursing facility for further convalescence and therapy.  Consults:   Significant Diagnostic Studies:   Treatments:   Discharge Exam: Blood pressure (!) 111/43, pulse (!) 50, temperature 98.1 F (36.7 C), temperature source Oral, resp. rate 18, height 5\' 4"  (1.626 m), weight 93.9 kg (207 lb), SpO2 93 %. Awake and alert. Oriented and appropriate. Cranial nerve function intact. Motor and sensory function of her extremities normal. Wound clean and dry. Chest and abdomen benign.  Disposition: 01-Home or Self Care     Medication List    TAKE these medications   albuterol 108 (90 Base) MCG/ACT inhaler Commonly known as:  PROVENTIL HFA;VENTOLIN HFA Inhale 2 puffs into the lungs every 6 (six) hours as needed for wheezing.   amLODipine 2.5 MG tablet Commonly known as:  NORVASC Take 2 tablets (5 mg total) by mouth daily.   aspirin EC 81 MG tablet Take 1 tablet (81 mg total) by mouth daily. What changed:  when to take this   cyclobenzaprine 10 MG tablet Commonly known as:  FLEXERIL Take 10 mg by mouth 3 (three) times daily as needed for muscle spasms.   diazepam 5 MG tablet Commonly known as:  VALIUM Take 1 tablet (5 mg total) by mouth 3 (three) times daily as needed for anxiety.    dimenhyDRINATE 50 MG tablet Commonly known as:  DRAMAMINE Take 50 mg by mouth every 8 (eight) hours as needed for dizziness.   DULoxetine 60 MG capsule Commonly known as:  CYMBALTA Take 60 mg by mouth daily.   EVZIO 0.4 MG/0.4ML Soaj Generic drug:  Naloxone HCl Inject 0.4 mLs into the muscle once as needed (for Anaphylaxis/allergic reaction).   gabapentin 300 MG capsule Commonly known as:  NEURONTIN Take 300 mg by mouth 3 (three) times daily.   hydrOXYzine 25 MG capsule Commonly known as:  VISTARIL Take 25 mg by mouth at bedtime.   metoprolol succinate 50 MG 24 hr tablet Commonly known as:  TOPROL-XL Take 50 mg by mouth at bedtime.   omeprazole 20 MG capsule Commonly known as:  PRILOSEC Take 1 capsule (20 mg total) by mouth daily.   ondansetron 8 MG disintegrating tablet Commonly known as:  ZOFRAN ODT Take 1 tablet (8 mg total) by mouth every 8 (eight) hours as needed for nausea or vomiting.   oxyCODONE-acetaminophen 10-325 MG tablet Commonly known as:  PERCOCET Take 1 tablet by mouth every 4 (four) hours as needed for pain.   Vitamin D 2000 units Caps Take 2,000 Units by mouth daily.   VOLTAREN 1 % Gel Generic drug:  diclofenac sodium Apply 4 g topically every 8 (eight) hours.        Signed: Jerris Keltz A 05/13/2016, 11:41 AM

## 2016-05-13 NOTE — Clinical Social Work Note (Signed)
Pt is ready for discharge today and will go to [facility]. Facility has received discharge information and is ready to admit pt. Pt's daughter, [name], will provide transportation to facility. Pt made arrangements for transfer. RN will call report. CSW will sign off as no further needs identified.   RN Report Info: Report # [phone] RM #[id number]  Dede Query, MSW, LCSW  Clinical Social Worker [phone]

## 2016-05-15 ENCOUNTER — Encounter: Payer: Self-pay | Admitting: Internal Medicine

## 2016-05-15 ENCOUNTER — Non-Acute Institutional Stay (SKILLED_NURSING_FACILITY): Payer: Medicare Other | Admitting: Internal Medicine

## 2016-05-15 DIAGNOSIS — E876 Hypokalemia: Secondary | ICD-10-CM | POA: Diagnosis not present

## 2016-05-15 DIAGNOSIS — M48061 Spinal stenosis, lumbar region without neurogenic claudication: Secondary | ICD-10-CM

## 2016-05-15 DIAGNOSIS — R42 Dizziness and giddiness: Secondary | ICD-10-CM

## 2016-05-15 DIAGNOSIS — I10 Essential (primary) hypertension: Secondary | ICD-10-CM

## 2016-05-15 DIAGNOSIS — J449 Chronic obstructive pulmonary disease, unspecified: Secondary | ICD-10-CM | POA: Diagnosis not present

## 2016-05-15 DIAGNOSIS — K5900 Constipation, unspecified: Secondary | ICD-10-CM

## 2016-05-15 DIAGNOSIS — M4806 Spinal stenosis, lumbar region: Secondary | ICD-10-CM | POA: Diagnosis not present

## 2016-05-15 DIAGNOSIS — F331 Major depressive disorder, recurrent, moderate: Secondary | ICD-10-CM

## 2016-05-15 NOTE — Progress Notes (Signed)
Provider:   Location:    Nursing Home Room Number: 130 Place of Service:  SNF (31)  PCP: [name], MD Patient Care Team: [name], MD as PCP - General (Family Medicine)  Extended Emergency Contact Information Primary Emergency Contact: Bray,Cheri Address: [address]          [place], [place] [address] [place] Home Phone: [phone] Mobile Phone: [phone] Relation: Daughter Secondary Emergency Contact: Leta Jung[place], [place] [address] [place] Home Phone: [phone] Relation: Other   Goals of Care: Advanced Directive information Advanced Directives 05/15/2016  Does patient have an advance directive? Yes  Type of Advance Directive -  Does patient want to make changes to advanced directive? No - Patient declined  Copy of advanced directive(s) in chart? Yes  Would patient like information on creating an advanced directive? -  Pre-existing out of facility DNR order (yellow form or pink MOST form) -      Chief Complaint  Patient presents with  . New Admit To SNF    New admit to [facility]    HPI: Patient is a 65 y.o. female seen today for admission to SNF for rehab after L4 -L5 Decompression and fusion for symptomatic lumbar stenosis. Patient is doing well. She does have pain at surgical site and according to the PT did really well with therapy. She had no C/O cough , Chest pain, or SOB. No abdominal pain. Appetite is good. Just C/O constipation. Patient says her mood is little better and is very motivated to move back with her older daughter.  Past Medical History:  Diagnosis Date  . Anxiety   . Arthritis   . Bronchitis   . Chronic back pain   . Chronic knee pain   . COPD (chronic obstructive pulmonary disease) (HCC)   . 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 Dizziness   . Frequent falls   . GERD (gastroesophageal reflux disease)   . Hypertension   . Memory difficulty 2015  . Neuropathy (HCC)   . Pain management     . Prediabetes   . 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 Shortness of breath dyspnea    with exertion  . 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 Weakness of both legs    Past Surgical History:  Procedure Laterality Date  . APPENDECTOMY  1971  . CHOLECYSTECTOMY N/A 03/12/2016   Procedure: LAPAROSCOPIC CHOLECYSTECTOMY;  Surgeon: Ancil Linsey, MD;  Location: [facility];  Service: General;  Laterality: N/A;  . KNEE CARTILAGE SURGERY Right 2007  . TONSILLECTOMY  1971  Smoking History Patient says she quit smoking 1 month ago.  Social History   Social History  . Marital status: Widowed    Spouse name: N/A  . Number of children: 2  . Years of education: 12   Occupational History  . Retired Statistician and [employer]    Social History Main Topics  . Smoking status: Current Every Day Smoker    Packs/day: 0.50    Years: 25.00    Types: Cigarettes    Start date: 08/05/1970  . Smokeless tobacco: Never Used     Comment: 04/30/15 less than 1 PPD  . Alcohol use No     Comment: Very rare  . Drug use: No  . Sexual activity: Not on file   Other Topics Concern  . Not on file  Social History Narrative   Lives at home with your 2 daughters, son-in-law, 4 grandkids and dogs   Right and left handed   Caffeine use: very rare .Has coke 2 times a week.   Multiple uncles and aunts on her father's side have heart disease.       Functional Status Survey:    Family History  Problem Relation Age of Onset  . Heart failure Father   . Diabetes Father   . Kidney failure Father       Allergies  Allergen Reactions  . Bee Venom Anaphylaxis  . Coconut Flavor Anaphylaxis    Per patient  . Mushroom Extract Complex Anaphylaxis    Extreme sweating, vomiting  . Amitriptyline Other (See Comments)    Has nightmares when using, not in right state of mind   . Toradol [Ketorolac Tromethamine] Nausea And  Vomiting  . Tramadol Nausea And Vomiting  . Trazodone And Nefazodone Nausea And Vomiting      Medication List       Accurate as of 05/15/16 10:11 AM. Always use your most recent med list.          albuterol 108 (90 Base) MCG/ACT inhaler Commonly known as:  PROVENTIL HFA;VENTOLIN HFA Inhale 2 puffs into the lungs every 6 (six) hours as needed for wheezing.   amLODipine 2.5 MG tablet Commonly known as:  NORVASC Take 2 tablets (5 mg total) by mouth daily.   aspirin EC 81 MG tablet Take 1 tablet (81 mg total) by mouth daily.   cyclobenzaprine 10 MG tablet Commonly known as:  FLEXERIL Take 10 mg by mouth 3 (three) times daily as needed for muscle spasms.   diazepam 5 MG tablet Commonly known as:  VALIUM Take 1 tablet (5 mg total) by mouth 3 (three) times daily as needed for anxiety.   dimenhyDRINATE 50 MG tablet Commonly known as:  DRAMAMINE Take 50 mg by mouth every 8 (eight) hours as needed for dizziness.   DULoxetine 60 MG capsule Commonly known as:  CYMBALTA Take 60 mg by mouth daily.   EVZIO 0.4 MG/0.4ML Soaj Generic drug:  Naloxone HCl Inject 0.4 mLs into the muscle once as needed (for Anaphylaxis/allergic reaction).   gabapentin 300 MG capsule Commonly known as:  NEURONTIN Take 300 mg by mouth 3 (three) times daily.   hydrOXYzine 25 MG capsule Commonly known as:  VISTARIL Take 25 mg by mouth at bedtime.   metoprolol succinate 50 MG 24 hr tablet Commonly known as:  TOPROL-XL Take 50 mg by mouth at bedtime.   omeprazole 20 MG capsule Commonly known as:  PRILOSEC Take 1 capsule (20 mg total) by mouth daily.   ondansetron 8 MG disintegrating tablet Commonly known as:  ZOFRAN ODT Take 1 tablet (8 mg total) by mouth every 8 (eight) hours as needed for nausea or vomiting.   oxyCODONE-acetaminophen 10-325 MG tablet Commonly known as:  PERCOCET Take 1 tablet by mouth every 4 (four) hours as needed for pain.   Vitamin D 2000 units Caps Take 2,000 Units by  mouth daily.   VOLTAREN 1 % Gel Generic drug:  diclofenac sodium Apply 4 g topically every 8 (eight) hours.       Review of Systems  Constitutional: Negative for appetite change, chills, diaphoresis and fatigue.  HENT: Negative.   Respiratory: Negative for cough, choking, chest tightness and shortness of breath.   Gastrointestinal: Positive for constipation. Negative for abdominal pain and blood in stool.  Genitourinary: Negative.   Musculoskeletal:  Positive for back pain. Negative for joint swelling and neck pain.  Neurological: Positive for dizziness and light-headedness. Negative for seizures and numbness.  Psychiatric/Behavioral: Negative for agitation. The patient is nervous/anxious.     There were no vitals filed for this visit. There is no height or weight on file to calculate BMI. Physical Exam  Constitutional: She appears well-developed and well-nourished.  HENT:  Head: Normocephalic.  Cardiovascular: Normal rate and regular rhythm.   Pulmonary/Chest: Breath sounds normal.  Abdominal: Soft. Bowel sounds are normal. There is no tenderness. There is no rebound.  Musculoskeletal: She exhibits no edema.  Neurological:  Patient was alert but was very confused about the dates and name of the place. But was able to do normal conversation.  Skin: Skin is warm.  Her Surgery site looked clean and had dressing on the Incision.    Labs reviewed: Basic Metabolic Panel:  Recent Labs  16/10/96 1441  04/12/16 2142 04/17/16 1302 05/02/16 0946  NA  --   < > 138 140 137  K  --   < > 3.4* 3.4* 4.0  CL  --   < > 104 105 103  CO2  --   < > GLUCOSE  --   < > 99 104* 98  BUN  --   < > CREATININE  --   < > 0.86 0.63 0.74  CALCIUM  --   < > 9.3 9.1 9.4  MG 1.8  --   --   --   --   PHOS 2.8  --   --   --   --   < > = values in this interval not displayed. Liver Function Tests:  Recent Labs  03/31/16 1419 04/12/16 2142 04/17/16 1302  AST ALT  ALKPHOS 94 98 91  BILITOT 0.9 0.8 0.7  PROT 7.7 7.4 7.1  ALBUMIN 4.0 4.1 3.9    CBC:  Recent Labs  03/10/16 1338  04/12/16 2142 04/17/16 1302 05/02/16 0946  WBC 11.1*  < > 14.8* 11.2* 9.7  NEUTROABS 6.8  --   --  4.2 4.4  HGB 14.5  < > 16.4* 16.2* 14.8  HCT 45.1  < > 49.3* 49.0* 46.5*  MCV 91.9  < > 91.1 91.9 93.4  PLT 266  < > 216 244 274  < > = values in this interval not displayed. Cardiac Enzymes: BNP: Invalid input(s): POCBNP Lab Results  Component Value Date   HGBA1C 6.5 (H) 03/07/2016   Lab Results  Component Value Date   TSH 3.083 03/10/2016   Lab Results  Component Value Date   VITAMINB12 315 06/29/2015   Imaging and Procedures obtained prior to SNF admission: CT scan Multiple done on 07/01 was negative for any Acute changes are any white matter changes.  Assessment/Plan  1 S/P Fusion for Lumber stenosis.  Patient is doing well. Pain seems to be well controlled on Cyclobenzaprine and Percocet. Will continue for now.  2. Essential Hypertension. BP in facility is 114/68. Continue Amlodipine and Metoprolol.  3.COPD. Patient says she has quit smoking. Will continue using PRN inhalers.  4 Dizziness. Patient is not having any active problems right now. D/W patient that she is on lot of medications like Neurontin and Diazepam which can cause confusion and Fall. She needs to be tapered off Diazepam. 5. Constipation  Will treat aggressively and start Constipation protocol. 6 Hypokalemia. Probably Post surgical. Check  BMP in 7 days. 7 Depression. Continue Cymbalta.

## 2016-05-22 ENCOUNTER — Other Ambulatory Visit: Payer: Self-pay | Admitting: *Deleted

## 2016-05-22 MED ORDER — OXYCODONE-ACETAMINOPHEN 10-325 MG PO TABS: 1.0000 | ORAL_TABLET | ORAL | 0 refills | Status: DC | PRN

## 2016-05-22 NOTE — Telephone Encounter (Signed)
[facility] #[phone] Fax: [phone]   

## 2016-06-01 ENCOUNTER — Encounter (HOSPITAL_COMMUNITY): Payer: Self-pay | Admitting: Emergency Medicine

## 2016-06-01 ENCOUNTER — Emergency Department (HOSPITAL_COMMUNITY)
Admission: EM | Admit: 2016-06-01 | Discharge: 2016-06-01 | Disposition: A | Discharge status: Skilled Nursing Facility | Payer: Medicare Other | Attending: Emergency Medicine | Admitting: Emergency Medicine

## 2016-06-01 ENCOUNTER — Emergency Department (HOSPITAL_COMMUNITY): Payer: Medicare Other

## 2016-06-01 DIAGNOSIS — S2020XA Contusion of thorax, unspecified, initial encounter: Secondary | ICD-10-CM | POA: Diagnosis not present

## 2016-06-01 DIAGNOSIS — Y999 Unspecified external cause status: Secondary | ICD-10-CM | POA: Diagnosis not present

## 2016-06-01 DIAGNOSIS — W06XXXA Fall from bed, initial encounter: Secondary | ICD-10-CM | POA: Diagnosis not present

## 2016-06-01 DIAGNOSIS — J449 Chronic obstructive pulmonary disease, unspecified: Secondary | ICD-10-CM | POA: Insufficient documentation

## 2016-06-01 DIAGNOSIS — Z7982 Long term (current) use of aspirin: Secondary | ICD-10-CM | POA: Insufficient documentation

## 2016-06-01 DIAGNOSIS — S300XXA Contusion of lower back and pelvis, initial encounter: Secondary | ICD-10-CM | POA: Diagnosis not present

## 2016-06-01 DIAGNOSIS — Y939 Activity, unspecified: Secondary | ICD-10-CM | POA: Insufficient documentation

## 2016-06-01 DIAGNOSIS — F17210 Nicotine dependence, cigarettes, uncomplicated: Secondary | ICD-10-CM | POA: Diagnosis not present

## 2016-06-01 DIAGNOSIS — W19XXXA Unspecified fall, initial encounter: Secondary | ICD-10-CM

## 2016-06-01 DIAGNOSIS — M549 Dorsalgia, unspecified: Secondary | ICD-10-CM | POA: Diagnosis present

## 2016-06-01 DIAGNOSIS — T07XXXA Unspecified multiple injuries, initial encounter: Secondary | ICD-10-CM

## 2016-06-01 DIAGNOSIS — S0083XA Contusion of other part of head, initial encounter: Secondary | ICD-10-CM | POA: Diagnosis not present

## 2016-06-01 DIAGNOSIS — R42 Dizziness and giddiness: Secondary | ICD-10-CM | POA: Diagnosis not present

## 2016-06-01 DIAGNOSIS — Z79899 Other long term (current) drug therapy: Secondary | ICD-10-CM | POA: Insufficient documentation

## 2016-06-01 DIAGNOSIS — I10 Essential (primary) hypertension: Secondary | ICD-10-CM | POA: Insufficient documentation

## 2016-06-01 DIAGNOSIS — Y92128 Other place in nursing home as the place of occurrence of the external cause: Secondary | ICD-10-CM | POA: Insufficient documentation

## 2016-06-01 MED ORDER — HYDROCODONE-ACETAMINOPHEN 5-325 MG PO TABS
1.0000 | ORAL_TABLET | Freq: Once | ORAL | Status: AC
  Administered 2016-06-01: 1 via ORAL
  Filled 2016-06-01: qty 1

## 2016-06-01 MED ORDER — ONDANSETRON HCL 4 MG PO TABS
4.0000 mg | ORAL_TABLET | Freq: Once | ORAL | Status: AC
  Administered 2016-06-01: 4 mg via ORAL
  Filled 2016-06-01: qty 1

## 2016-06-01 MED ORDER — METHOCARBAMOL 500 MG PO TABS
500.0000 mg | ORAL_TABLET | Freq: Once | ORAL | Status: AC
  Administered 2016-06-01: 500 mg via ORAL
  Filled 2016-06-01: qty 1

## 2016-06-01 NOTE — ED Notes (Signed)
[facility] was called at this time and made aware pt is up for discharge and they will send staff with her personal w/c for discharge back.

## 2016-06-01 NOTE — Discharge Instructions (Signed)
D CT scan of the head and neck are negative for acute problems. The x-rays of the left shoulder, left ribs, and pelvis are negative for acute problems. Please use Tylenol and ibuprofen for soreness. Please return to the emergency department if any changes, problems, or concerns.

## 2016-06-01 NOTE — ED Notes (Signed)
This nurse called [facility] again and they stated they would have staff to come over and pick up patient to transport back.

## 2016-06-01 NOTE — ED Provider Notes (Signed)
[facility]-EM[facility] DEPT Provider Note   CSN: [id number] Arrival date & time: 06/01/16  3220     History   Chief Complaint Chief Complaint  Patient presents with  . Fall    HPI Carla [name] is a 65 y.o. female.  Patient is a 65 year old female resident of a local nursing facility who fell from the bed today.  The history is obtained from the patient and from notes from the nursing facility. The patient fell from the bed. She complains of pain over the left eyebrow, neck pain, shoulder pain, left rib pain, and left pelvis pain. She denies any loss of consciousness.   The history is provided by the patient and the nursing home.  Fall  This is a new problem.    Past Medical History:  Diagnosis Date  . Anxiety   . Arthritis   . Bronchitis   . Chronic back pain   . Chronic knee pain   . COPD (chronic obstructive pulmonary disease) (HCC)   . 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 Dizziness   . Frequent falls   . GERD (gastroesophageal reflux disease)   . Hypertension   . Memory difficulty 2015  . Neuropathy (HCC)   . Pain management   . Prediabetes   . 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 Shortness of breath dyspnea    with exertion  . 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 Weakness of both legs     Patient Active Problem List   Diagnosis Date Noted  . Degenerative spondylolisthesis 05/09/2016  . Intractable nausea and vomiting 03/10/2016  . Abdominal pain 03/10/2016  . Abnormal thyroid function test 03/08/2016  . Chest pain, unspecified 03/07/2016  . Epigastric abdominal pain 03/07/2016  . Cholecystitis, acute 03/07/2016  . Chronic pain syndrome 03/07/2016  . Prediabetes 03/07/2016  . Obesity, morbid (HCC) 03/07/2016  . Elevated troponin 12/21/2015  . Fall 12/21/2015  . 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 Erythrocytosis   . Essential  hypertension 06/29/2015  . 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 Anxiety attack 03/08/2012  . COPD (chronic obstructive pulmonary disease) (HCC) 03/05/2012    Past Surgical History:  Procedure Laterality Date  . APPENDECTOMY  1971  . BACK SURGERY     Lumbar Fusion  . CHOLECYSTECTOMY N/A 03/12/2016   Procedure: LAPAROSCOPIC CHOLECYSTECTOMY;  Surgeon: Ancil Linsey, MD;  Location: [facility];  Service: General;  Laterality: N/A;  . KNEE CARTILAGE SURGERY Right 2007  . TONSILLECTOMY  1971    OB History    Gravida Para Term Preterm AB Living   2 2 2          SAB TAB Ectopic Multiple Live Births                   Home Medications    Prior to Admission medications   Medication Sig Start Date End Date Taking? Authorizing Provider  albuterol (PROVENTIL HFA;VENTOLIN HFA) 108 (90 BASE) MCG/ACT inhaler Inhale 2 puffs into the lungs every 6 (six) hours as needed for wheezing. 06/21/13   Nimish Normajean Glasgow, MD  amLODipine (NORVASC) 2.5 MG tablet Take 2 tablets (5 mg total) by mouth daily. 06/30/15   Otis Brace, MD  aspirin EC 81 MG tablet Take 1 tablet (81 mg total) by mouth daily. Patient taking differently: Take 81 mg by mouth every morning.  12/22/15   Henderson Cloud, MD  Cholecalciferol (  VITAMIN D) 2000 units CAPS Take 2,000 Units by mouth daily.     Historical Provider, MD  cyclobenzaprine (FLEXERIL) 10 MG tablet Take 10 mg by mouth 3 (three) times daily as needed for muscle spasms.    Historical Provider, MD  diazepam (VALIUM) 5 MG tablet Take 1 tablet (5 mg total) by mouth 3 (three) times daily as needed for anxiety. 05/13/16   Julio SicksHenry Pool, MD  dimenhyDRINATE (DRAMAMINE) 50 MG tablet Take 50 mg by mouth every 8 (eight) hours as needed for dizziness.    Historical Provider, MD  DULoxetine (CYMBALTA) 60 MG capsule Take 60 mg by mouth daily.     Historical Provider, MD  EVZIO 0.4 MG/0.4ML SOAJ Inject 0.4 mLs into the muscle once as needed (for  Anaphylaxis/allergic reaction).  02/19/15   Historical Provider, MD  gabapentin (NEURONTIN) 300 MG capsule Take 300 mg by mouth 3 (three) times daily.    Historical Provider, MD  hydrOXYzine (VISTARIL) 25 MG capsule Take 25 mg by mouth at bedtime.    Historical Provider, MD  metoprolol succinate (TOPROL-XL) 50 MG 24 hr tablet Take 50 mg by mouth at bedtime. 12/14/15   Historical Provider, MD  omeprazole (PRILOSEC) 20 MG capsule Take 1 capsule (20 mg total) by mouth daily. 06/30/15   Otis BraceMarjan Rabbani, MD  ondansetron (ZOFRAN ODT) 8 MG disintegrating tablet Take 1 tablet (8 mg total) by mouth every 8 (eight) hours as needed for nausea or vomiting. 03/13/16   Erick BlinksJehanzeb Memon, MD  oxyCODONE-acetaminophen (PERCOCET) 10-325 MG tablet Take 1 tablet by mouth every 4 (four) hours as needed for pain. Max APAP 3gm/24 hrs from all sources 05/22/16   Tiffany L Reed, DO  VOLTAREN 1 % GEL Apply 4 g topically every 8 (eight) hours.  02/06/15   Historical Provider, MD    Family History Family History  Problem Relation Age of Onset  . Heart failure Father   . Diabetes Father   . Kidney failure Father     Social History Social History  Substance Use Topics  . Smoking status: Current Every Day Smoker    Packs/day: 0.50    Years: 25.00    Types: Cigarettes    Start date: 08/05/1970  . Smokeless tobacco: Never Used     Comment: 04/30/15 less than 1 PPD  . Alcohol use No     Comment: Very rare     Allergies   Bee venom; Coconut flavor; Mushroom extract complex; Amitriptyline; Toradol [ketorolac tromethamine]; Tramadol; and Trazodone and nefazodone   Review of Systems Review of Systems  Musculoskeletal: Positive for arthralgias and back pain.  Neurological: Positive for dizziness.  Psychiatric/Behavioral: The patient is nervous/anxious.        Pseudodementia  All other systems reviewed and are negative.    Physical Exam Updated Vital Signs BP (!) 106/53 (BP Location: Right Arm)   Pulse (!) 50   Temp  97.6 F (36.4 C) (Oral)   Resp 18   SpO2 96%   Physical Exam  Constitutional: She is oriented to person, place, and time. She appears well-developed and well-nourished.  Non-toxic appearance.  HENT:  Head: Normocephalic.    Right Ear: Tympanic membrane and external ear normal.  Left Ear: Tympanic membrane and external ear normal.  Eyes: EOM and lids are normal. Pupils are equal, round, and reactive to light.  Neck: Normal range of motion. Neck supple. Carotid bruit is not present.  C collar in place. Trachea midline.  Cardiovascular: Normal rate, regular rhythm, normal heart  sounds, intact distal pulses and normal pulses.   Pulmonary/Chest: Breath sounds normal. No respiratory distress. She exhibits tenderness. She exhibits no deformity.    Abdominal: Soft. Bowel sounds are normal. There is no tenderness. There is no guarding.  Musculoskeletal: Normal range of motion.       Left shoulder: She exhibits pain.  Pelvis pain and left hip soreness. No shortening noted. DP 2+. C collar in place.  Lymphadenopathy:       Head (right side): No submandibular adenopathy present.       Head (left side): No submandibular adenopathy present.    She has no cervical adenopathy.  Neurological: She is alert and oriented to person, place, and time. She has normal strength. No cranial nerve deficit or sensory deficit.  Skin: Skin is warm and dry.  Psychiatric: She has a normal mood and affect. Her speech is normal.  Nursing note and vitals reviewed.    ED Treatments / Results  Labs (all labs ordered are listed, but only abnormal results are displayed) Labs Reviewed - No data to display  EKG  EKG Interpretation None       Radiology No results found.  Procedures Procedures (including critical care time)  Medications Ordered in ED Medications - No data to display   Initial Impression / Assessment and Plan / ED Course  I have reviewed the triage vital signs and the nursing  notes.  Pertinent labs & imaging results that were available during my care of the patient were reviewed by me and considered in my medical decision making (see chart for details).  Clinical Course    **I have reviewed nursing notes, vital signs, and all appropriate lab and imaging results for this patient.*  Final Clinical Impressions(s) / ED Diagnoses  Vital signs well within normal limits. CT scan of the head reveals no fracture. There is no evidence of any intracranial abnormality on. CT of the cervical spine reveals no fracture present on. There is some spinal stenosis due to posterior spurring at the C5-C6 area. There is no fracture of the hips or pelvis noted. There is some symmetric narrowing at both hip joints. There is osteoarthritis changes involving the left shoulder, but no fracture or dislocation. There is no fracture noted on the left ribs and chest x-ray.  Patient treated in the emergency department with Robaxin and Norco for soreness. The patient will be evaluated by the physician for the nursing facility for additional management. I discussed the findings with the patient in terms which he understands. Feels it is safe for the patient be discharged back to the nursing facility.    Final diagnoses:  Fall, initial encounter  Contusion, multiple sites    New Prescriptions New Prescriptions   No medications on file     Ivery Quale, PA-C 06/01/16 1044    Eber Hong, MD 06/01/16 1048

## 2016-06-01 NOTE — ED Notes (Signed)
PT stated she was transferring herself from wheelchair to bed at the nursing home and lost grip on her left foot and slid to the floor on her left side. PT c/o left shoulder to elbow pain and left hip and knee pain.

## 2016-06-01 NOTE — ED Triage Notes (Signed)
Fall today at [facility] while trying to get in bed. Lumbar Fusion on 05/13/16 at [facility]. C/o head pain and back pain.

## 2016-06-04 ENCOUNTER — Non-Acute Institutional Stay (SKILLED_NURSING_FACILITY): Payer: Medicare Other | Admitting: Internal Medicine

## 2016-06-04 DIAGNOSIS — I10 Essential (primary) hypertension: Secondary | ICD-10-CM

## 2016-06-04 DIAGNOSIS — W19XXXS Unspecified fall, sequela: Secondary | ICD-10-CM | POA: Diagnosis not present

## 2016-06-04 DIAGNOSIS — Y92129 Unspecified place in nursing home as the place of occurrence of the external cause: Principal | ICD-10-CM

## 2016-06-04 NOTE — Progress Notes (Signed)
This is an acute visit.  Level care skilled.  Facility [facility].  Chief complaint-acute visit follow-up ER visit after fall.  History of present illness.  Patient is a 65 year old female seen today status post fall over the weekend with an ER visit-patient was complaining of diffuse pain and was sent to the ER-apparently this was a mechanical fall.-She fell out of bed  ER workup was unremarkable-x-rays of her chest as well as spine were unremarkable CT of the head did not show any acute process.  She has returned the facility is not really complaining of pain at this time she does have Percocet as needed every 4 hours also Voltaren gel when necessary as well.  She also continues on Neurontin 300 mg 3 times a day.  I also note that time she has listed low blood pressures with systolics in the 90s apparently she's asymptomatic however. He does apparently have some history of dizziness which is chronic she is not complaining of that today.  Per review of blood pressures these systolics in the 90s appear to be fairly rare I see  readings of 120/78-118/70-134/84-109/64-I do see a 94/41 and a 91/56 but these do not appear to be common  I did take her blood pressure manually she was lying in bed and blood pressure was 120/72-she had been sleeping but will order orthostatic blood pressures when possible lying and standing daily so we can get an eye on any possible orthostatic hypotension.  Apparently she had not complained of dizziness or syncopal-type feelings before her mechanical fall.  Currently she is resting in bed comfortably vital signs are stable she has no acute complaints  She is here for rehabilitation after L4-L5 decompression and fusion for symptomatic lumbar stenosis.     Past Medical History:  Diagnosis Date  . Anxiety   . Arthritis   . Bronchitis   . Chronic back pain   . Chronic knee pain   . COPD (chronic obstructive pulmonary disease) (HCC)   .  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 Dizziness   . Frequent falls   . GERD (gastroesophageal reflux disease)   . Hypertension   . Memory difficulty 2015  . Neuropathy (HCC)   . Pain management   . Prediabetes   . 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 Shortness of breath dyspnea    with exertion  . 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 Weakness of both legs         Patient Active Problem List   Diagnosis Date Noted  . Degenerative spondylolisthesis 05/09/2016  . Intractable nausea and vomiting 03/10/2016  . Abdominal pain 03/10/2016  . Abnormal thyroid function test 03/08/2016  . Chest pain, unspecified 03/07/2016  . Epigastric abdominal pain 03/07/2016  . Cholecystitis, acute 03/07/2016  . Chronic pain syndrome 03/07/2016  . Prediabetes 03/07/2016  . Obesity, morbid (HCC) 03/07/2016  . Elevated troponin 12/21/2015  . Fall 12/21/2015  . 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 Erythrocytosis   . Essential hypertension 06/29/2015  . 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 Anxiety attack 03/08/2012  . COPD (chronic obstructive pulmonary disease) (HCC) 03/05/2012         Past Surgical History:  Procedure Laterality Date  . APPENDECTOMY  1971  . BACK SURGERY     Lumbar Fusion  . CHOLECYSTECTOMY N/A 03/12/2016   Procedure: LAPAROSCOPIC CHOLECYSTECTOMY;  Surgeon: Ancil Linsey, MD;  Location: [facility];  Service: General;  Laterality: N/A;  . KNEE CARTILAGE SURGERY Right 2007  . TONSILLECTOMY  1971            OB History    Gravida Para Term Preterm AB Living   2 2 2          SAB TAB Ectopic Multiple Live Births                   Home Medications                                Prior to Admission medications   Medication Sig Start Date End Date Taking? Authorizing Provider  albuterol  (PROVENTIL HFA;VENTOLIN HFA) 108 (90 BASE) MCG/ACT inhaler Inhale 2 puffs into the lungs every 6 (six) hours as needed for wheezing. 06/21/13   [name], MD  amLODipine (NORVASC) 2.5 MG tablet Take 2 tablets (5 mg total) by mouth daily. 06/30/15   Otis BraceMarjan Rabbani, MD  aspirin EC 81 MG tablet Take 1 tablet (81 mg total) by mouth daily. Patient taking differently: Take 81 mg by mouth every morning.  12/22/15   Henderson CloudEstela Y Hernandez Acosta, MD  Cholecalciferol (VITAMIN D) 2000 units CAPS Take 2,000 Units by mouth daily.     Historical Provider, MD  cyclobenzaprine (FLEXERIL) 10 MG tablet Take 10 mg by mouth 3 (three) times daily as needed for muscle spasms.    Historical Provider, MD  diazepam (VALIUM) 5 MG tablet Take 1 tablet (5 mg total) by mouth 3 (three) times daily as needed for anxiety. 05/13/16   Julio SicksHenry Pool, MD  dimenhyDRINATE (DRAMAMINE) 50 MG tablet Take 50 mg by mouth every 8 (eight) hours as needed for dizziness.    Historical Provider, MD  DULoxetine (CYMBALTA) 60 MG capsule Take 60 mg by mouth daily.     Historical Provider, MD  EVZIO 0.4 MG/0.4ML SOAJ Inject 0.4 mLs into the muscle once as needed (for Anaphylaxis/allergic reaction).  02/19/15   Historical Provider, MD  gabapentin (NEURONTIN) 300 MG capsule Take 300 mg by mouth 3 (three) times daily.    Historical Provider, MD  hydrOXYzine (VISTARIL) 25 MG capsule Take 25 mg by mouth at bedtime.    Historical Provider, MD  metoprolol succinate (TOPROL-XL) 50 MG 24 hr tablet Take 50 mg by mouth at bedtime. 12/14/15   Historical Provider, MD  omeprazole (PRILOSEC) 20 MG capsule Take 1 capsule (20 mg total) by mouth daily. 06/30/15   Otis BraceMarjan Rabbani, MD  ondansetron (ZOFRAN ODT) 8 MG disintegrating tablet Take 1 tablet (8 mg total) by mouth every 8 (eight) hours as needed for nausea or vomiting. 03/13/16   Erick BlinksJehanzeb Memon, MD  oxyCODONE-acetaminophen (PERCOCET) 10-325 MG tablet Take 1 tablet by mouth every 4 (four) hours  as needed for pain. Max APAP 3gm/24 hrs from all sources 05/22/16   Tiffany L Reed, DO  VOLTAREN 1 % GEL Apply 4 g topically every 8 (eight) hours.  02/06/15   Historical Provider, MD    Family History      Family History  Problem Relation Age of Onset  . Heart failure Father   . Diabetes Father   . Kidney failure Father     Social History       Social History  Substance Use Topics  . Smoking status: Current Every Day Smoker    Packs/day: 0.50    Years: 25.00    Types:  Cigarettes    Start date: 08/05/1970  . Smokeless tobacco: Never Used     Comment: 04/30/15 less than 1 PPD  . Alcohol use No     Comment: Very rare     Allergies                     Bee venom; Coconut flavor; Mushroom extract complex; Amitriptyline; Toradol [ketorolac tromethamine]; Tramadol; and Trazodone and nefazodone   Review of Systems In general is no complaints of fever or chills.  Skin is not complaining of rashes or itching currently surgical site low back appears to be healing unremarkably.  Head ears eyes nose mouth and throat does not complaining of any visual changes or sore throat.  Resp--- no complaints of cough or shortness of breath she does have a history COPD.  Cardiac does not complaining of chest pain  GI is not complaining of abdominal discomfort nausea vomiting diarrhea constipation.  Muscle skeletal has an extensive history here of discomfort but is not complaining of pain currently.  Neurologic is not complaining currently of dizziness or headache does have some history of somewhat chronic dizziness it appears.  Psych is not complaining of overt depression or anxiety-but does apparently have a history of anxiety she is on diazepam when necessary.  Physical exam.  She is afebrile pulse is 60 respirations 16 blood pressure 120/72.  In general this is a pleasant late middle-aged female in no distress lying comfortably in bed she been sleeping but  was easily arousable.  Her skin is warm and dry she does a well-healed surgical scar midline low back.  Eyes pupils appear reactive light sclerae and conjunctivae clear visual acuity appears grossly intact.  Oropharynx clear mucous membranes moist.  Chest is clear to auscultation there is no labored breathing.  Heart is regular rate and rhythm without murmur gallop or rub.  Abdomen is somewhat obese soft nontender there are positive bowel sounds.  Musculoskeletal is able to move all extremities 4 limited exam since she is lying in bed but could not appreciate any pain with passive range of motion of her legs or arms.  Neurologic is grossly intact her speech is clear no lateralizing findings  Psych-she is grossly oriented and conversant apparently does have some confusion at times which is not new.  Labs.  05/02/2016.  WBC 9.7 hemoglobin 14.8 platelets 274  Sodium 137 potassium 4 BUN 13 creatinine 0.74.  Assessment and plan.  #1-fall  with no apparent injury-patient appears to be at her baseline today again she had a fairly extensive ER workup which was nondiagnostic-at this point her pain appears to be controlled she does have orders for Percocet 10-325 milligrams every 4 hours when necessary she is also has Voltaren gel-is on Neurontin 300 mg 3 times a day.  #2 hypertension-hypotension?-She continues on Toprol-XL 50 mg a day she is also on Norvasc 5 mg a day at this point blood pressure appears reasonably well controlled I see occasional hesystolics in the 90s these are not  Common  , however-will order blood pressures lying and standing daily done manually with a log in provider book for review here.  [id number]  .

## 2016-06-05 ENCOUNTER — Other Ambulatory Visit: Payer: Self-pay

## 2016-06-05 MED ORDER — OXYCODONE-ACETAMINOPHEN 10-325 MG PO TABS: 1.0000 | ORAL_TABLET | ORAL | 0 refills | Status: DC | PRN

## 2016-06-05 NOTE — Telephone Encounter (Signed)
RX faxed to [facility] @ [phone]. Phone number [phone]  

## 2016-06-10 ENCOUNTER — Encounter: Payer: Self-pay | Admitting: Internal Medicine

## 2016-06-10 ENCOUNTER — Non-Acute Institutional Stay (SKILLED_NURSING_FACILITY): Payer: Medicare Other | Admitting: Internal Medicine

## 2016-06-10 DIAGNOSIS — F410 Panic disorder [episodic paroxysmal anxiety] without agoraphobia: Secondary | ICD-10-CM | POA: Diagnosis not present

## 2016-06-10 DIAGNOSIS — M4806 Spinal stenosis, lumbar region: Secondary | ICD-10-CM | POA: Diagnosis not present

## 2016-06-10 DIAGNOSIS — F329 Major depressive disorder, single episode, unspecified: Secondary | ICD-10-CM

## 2016-06-10 DIAGNOSIS — M48062 Spinal stenosis, lumbar region with neurogenic claudication: Secondary | ICD-10-CM

## 2016-06-10 DIAGNOSIS — F32A Depression, unspecified: Secondary | ICD-10-CM

## 2016-06-10 DIAGNOSIS — J449 Chronic obstructive pulmonary disease, unspecified: Secondary | ICD-10-CM | POA: Diagnosis not present

## 2016-06-10 DIAGNOSIS — I10 Essential (primary) hypertension: Secondary | ICD-10-CM | POA: Diagnosis not present

## 2016-06-10 NOTE — Progress Notes (Signed)
Location:   [facility] Room Number: 130/P Place of Service:  SNF (31) Provider:  Charlesetta Garibaldi, MD  Patient Care Team: [name], MD as PCP - [facility])  Extended Emergency Contact Information Primary Emergency Contact: Bray,Cheri Address: [address]          [place], [place] [address] [place] Home Phone: [phone] Mobile Phone: [phone] Relation: Daughter Secondary Emergency Contact: Leta Jung[place], [place] [address] [place] Home Phone: [phone] Relation: Other  Code Status:  Full Code Goals of care: Advanced Directive information Advanced Directives 06/10/2016  Does patient have an advance directive? Yes  Type of Advance Directive (No Data)  Does patient want to make changes to advanced directive? No - Patient declined  Copy of advanced directive(s) in chart? Yes  Would patient like information on creating an advanced directive? -  Pre-existing out of facility DNR order (yellow form or pink MOST form) -     Chief Complaint  Patient presents with  . Medical Management of Chronic Issues    Roitine Visit   Including follow-up of L4-L5 decompression and fusion for lumbar stenosis-hypertension-depression-hypokalemia-neuropathy-anxiety-.   HPI:  Carla [name] is a 65 y.o. female seen today for medical management of chronic diseases as noted above.  She was originally admitted here for rehabilitation after an L4-L5 decompression and fusion for symptomatic lumbar stenosis.  She appears to be doing well with therapy per discussion with the therapist today.  She is receiving a muscle relaxer as well as Percocet as needed for pain.  She recently did sustain a fall and went to the ER where she was not found to have any acute issue-she has return the facility and appears again to be doing fairly well.  There has been some questionable history of hypotension with systolics listed in the 90s  however I did take it manually recently and got a systolic of 120-I also took her blood pressure standing today and it was 120/80-I also spoke with her therapist states that she has done orthostatics as well and these have been stable.  She does have a history of hypertension and continues on Norvasc as well as Lopressor.  She does at times have a history of anxiety she is on diazepam 5 mg 3 times a day when necessary we have been tied to titrate this down secondary to some complaints of weakness in dizziness.  She does have a history of depression continues on Cymbalta and this appears stable she says at times she will feel somewhat down but apparently this is getting better-says she feels a bit down today because the weather is somewhat rare rate.  Currently she is sitting in her chair comfortably and has no acute complaints.  .     Past Medical History:  Diagnosis Date  . Anxiety   . Arthritis   . Bronchitis   . Chronic back pain   . Chronic knee pain   . COPD (chronic obstructive pulmonary disease) (HCC)   . 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 Dizziness   . Frequent falls   . GERD (gastroesophageal reflux disease)   . Hypertension   . Memory difficulty 2015  . Neuropathy (HCC)   . Pain management   . Prediabetes   . 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 Shortness of breath dyspnea    with exertion  .  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 Weakness of both legs    Past Surgical History:  Procedure Laterality Date  . APPENDECTOMY  1971  . BACK SURGERY     Lumbar Fusion  . CHOLECYSTECTOMY N/A 03/12/2016   Procedure: LAPAROSCOPIC CHOLECYSTECTOMY;  Surgeon: Ancil Linsey, MD;  Location: [facility];  Service: General;  Laterality: N/A;  . KNEE CARTILAGE SURGERY Right 2007  . TONSILLECTOMY  1971    Allergies  Allergen Reactions  . Bee Venom Anaphylaxis  . Coconut  Flavor Anaphylaxis    Per patient  . Mushroom Extract Complex Anaphylaxis    Extreme sweating, vomiting  . Amitriptyline Other (See Comments)    Has nightmares when using, not in right state of mind   . Toradol [Ketorolac Tromethamine] Nausea And Vomiting  . Tramadol Nausea And Vomiting  . Trazodone And Nefazodone Nausea And Vomiting      Medication List       Accurate as of 06/10/16  2:55 PM. Always use your most recent med list.          albuterol 108 (90 Base) MCG/ACT inhaler Commonly known as:  PROVENTIL HFA;VENTOLIN HFA Inhale 2 puffs into the lungs every 6 (six) hours as needed for wheezing.   amLODipine 2.5 MG tablet Commonly known as:  NORVASC Take 2 tablets (5 mg total) by mouth daily.   aspirin EC 81 MG tablet Take 81 mg by mouth daily.   BIOFREEZE 4 % Gel Generic drug:  Menthol (Topical Analgesic) Apply to  Patients POC PRN   cyclobenzaprine 10 MG tablet Commonly known as:  FLEXERIL Take 10 mg by mouth 3 (three) times daily as needed for muscle spasms.   diazepam 5 MG tablet Commonly known as:  VALIUM Take 1 tablet (5 mg total) by mouth 3 (three) times daily as needed for anxiety.   dimenhyDRINATE 50 MG tablet Commonly known as:  DRAMAMINE Take 50 mg by mouth every 8 (eight) hours as needed for dizziness.   DULoxetine 60 MG capsule Commonly known as:  CYMBALTA Take 60 mg by mouth daily.   EVZIO 0.4 MG/0.4ML Soaj Generic drug:  Naloxone HCl Inject 0.4 mLs into the muscle once as needed (for Anaphylaxis/allergic reaction).   gabapentin 300 MG capsule Commonly known as:  NEURONTIN Take 300 mg by mouth 3 (three) times daily.   hydrOXYzine 25 MG capsule Commonly known as:  VISTARIL Take 25 mg by mouth at bedtime.   metoprolol succinate 50 MG 24 hr tablet Commonly known as:  TOPROL-XL Take 50 mg by mouth at bedtime.   MILK OF MAGNESIA PO 30 ml by mouth as needed for constipation   omeprazole 20 MG capsule Commonly known as:  PRILOSEC Take 1  capsule (20 mg total) by mouth daily.   ondansetron 8 MG disintegrating tablet Commonly known as:  ZOFRAN ODT Take 1 tablet (8 mg total) by mouth every 8 (eight) hours as needed for nausea or vomiting.   oxyCODONE-acetaminophen 10-325 MG tablet Commonly known as:  PERCOCET Take 1 tablet by mouth every 4 (four) hours as needed for pain. Max APAP 3gm/24 hrs from all sources   SENOKOT S 8.6-50 MG tablet Generic drug:  senna-docusate Take 1 tablet by mouth 2 (two) times daily.   Vitamin D 2000 units Caps Take 2,000 Units by mouth daily.   VOLTAREN 1 % Gel Generic drug:  diclofenac sodium Apply 4 g topically every 8 (eight) hours.       Review of  Systems   In general i no complaints of fever or chills.  Skin is not complaining of rashes or itching currently surgical site low back   Head ears eyes nose mouth and throat does not complaining of any visual changes or sore throat.  Resp--- no complaints of cough or shortness of breath she does have a history COPD. has when necessary nebulizers  Cardiac does not complaining of chest pain  GI is not complaining of abdominal discomfort nausea vomiting diarrhea constipation.  Muscle skeletal has an extensive history here of discomfort but is not complaining of pain currently doing well with therapy per discussion with her therapist.  Neurologic is not complaining currently of dizziness or headache does have some history of somewhat chronic dizziness it appears.  Psych is not complaining of overt depression or anxiety-but does apparently have a history of anxiety she is on diazepam when necessary.   Immunization History  Administered Date(s) Administered  . Influenza,inj,Quad PF,36+ Mos 06/21/2013   Pertinent  Health Maintenance Due  Topic Date Due  . PAP SMEAR  08/05/1974  . MAMMOGRAM  08/06/2003  . COLONOSCOPY  08/06/2003  . INFLUENZA VACCINE  05/13/2016   Fall Risk  07/16/2015 04/30/2015  Falls in the past year?  Yes Yes  Number falls in past yr: 2 or more 2 or more  Injury with Fall? Yes No  Risk Factor Category  High Fall Risk -  Risk for fall due to : History of fall(s);Impaired mobility;Impaired balance/gait;Other (Comment) Impaired mobility  Risk for fall due to (comments): - "knees give out, get dizzy"  Follow up Falls prevention discussed -   Functional Status Survey:   Richard 98.4 pulse 92 respirations 22 blood pressure taken manually standing was 120/80-previous blood pressures 120/73-120/76 18/74 I see listed 96/43 but this appears to be fairly unusual-I suspect this may be a machine variant-    Body mass index is 36.9 kg/m. Physical Exam In general this is a pleasant late middle-aged female in no distress sitting comfortably in her chair  Her skin is warm and dry she does a well-healed surgical scar midline low back.  Eyes pupils appear reactive light sclerae and conjunctivae clear visual acuity appears grossly intact.  Oropharynx clear mucous membranes moist.  Chest is clear to auscultation there is no labored breathing.  Heart is regular rate and rhythm without murmur gallop or rub She has trace lower extremity edema bilaterally.  Abdomen is somewhat obese soft nontender there are positive bowel sounds.  Musculoskeletal is able to move all extremities 4  Is able to stand without assistance-and ambulate she is doing well with therapy. Per strength appears to be intact also 4 extremities.  Neurologic is grossly intact her speech is clear no lateralizing findings  Psych-she is grossly oriented and conversant apparently does have some confusion at times which is not new--was conversant today talking about the weather and how her joints appear to be affected at times by the weather.  Labs. Labs reviewed:  Recent Labs  06/29/15 1441  04/12/16 2142 04/17/16 1302 05/02/16 0946  NA  --   < > 138 140 137  K  --   < > 3.4* 3.4* 4.0  CL  --   < > 104 105 103    CO2  --   < > 23 28 28   GLUCOSE  --   < > 99 104* 98  BUN  --   < > 14 12 13   CREATININE  --   < >  0.86 0.63 0.74  CALCIUM  --   < > 9.3 9.1 9.4  MG 1.8  --   --   --   --   PHOS 2.8  --   --   --   --   < > = values in this interval not displayed.  Recent Labs  03/31/16 1419 04/12/16 2142 04/17/16 1302  AST 26 25 23   ALT 18 20 19   ALKPHOS 94 98 91  BILITOT 0.9 0.8 0.7  PROT 7.7 7.4 7.1  ALBUMIN 4.0 4.1 3.9    Recent Labs  03/10/16 1338  04/12/16 2142 04/17/16 1302 05/02/16 0946  WBC 11.1*  < > 14.8* 11.2* 9.7  NEUTROABS 6.8  --   --  4.2 4.4  HGB 14.5  < > 16.4* 16.2* 14.8  HCT 45.1  < > 49.3* 49.0* 46.5*  MCV 91.9  < > 91.1 91.9 93.4  PLT 266  < > 216 244 274  < > = values in this interval not displayed. Lab Results  Component Value Date   TSH 3.083 03/10/2016   Lab Results  Component Value Date   HGBA1C 6.5 (H) 03/07/2016   No results found for: CHOL, HDL, LDLCALC, LDLDIRECT, TRIG, CHOLHDL  Significant Diagnostic Results in last 30 days:  Dg Ribs Unilateral W/chest Left  Result Date: 06/01/2016 CLINICAL DATA:  Pain following fall EXAM: LEFT RIBS AND CHEST - 3+ VIEW COMPARISON:  Chest radiograph Mar 10, 2016 FINDINGS: Frontal chest as well as oblique and cone-down lower rib images were obtained. Lungs are clear. Heart size and pulmonary vascularity are normal. No adenopathy. No demonstrable pneumothorax or pleural effusion. There is no appreciable rib fracture. IMPRESSION: No appreciable rib fracture. No edema or consolidation. No pneumothorax evident. Electronically Signed   By: Bretta Bang III M.D.   On: 06/01/2016 09:21   Ct Head Wo Contrast  Result Date: 06/01/2016 CLINICAL DATA:  Larey Seat off bed today, hit forehead EXAM: CT HEAD WITHOUT CONTRAST CT CERVICAL SPINE WITHOUT CONTRAST TECHNIQUE: Multidetector CT imaging of the head and cervical spine was performed following the standard protocol without intravenous contrast. Multiplanar CT image  reconstructions of the cervical spine were also generated. COMPARISON:  04/17/2016 FINDINGS: CT HEAD FINDINGS No skull fracture is noted. Mild mucosal thickening with air-fluid level posterior aspect of the left maxillary sinus. The mastoid air cells are unremarkable. No acute cortical infarction. No hydrocephalus. The gray and white-matter differentiation is preserved. No mass lesion is noted on this unenhanced scan. CT CERVICAL SPINE FINDINGS Axial images of the cervical spine shows no acute fracture or subluxation. Computer processed images shows no acute fracture or subluxation. There are degenerative changes C1-C2 articulation. Mild disc space flattening with mild anterior spurring at C4-C5 level. There is moderate disc space flattening with mild anterior and moderate posterior spurring at C5-C6 level. Moderate anterior spurring noted lower endplate of C7. Mild anterior spurring lower endplate of T1 vertebral body. There is mild to moderate spinal canal stenosis due to posterior spurring at C5-C6 level. No prevertebral soft tissue swelling. Cervical airway is patent. Please note there is thickening of esophageal wall. Findings are highly suspicious for gastroesophageal reflux. Further correlation with endoscopy is recommended. No pneumothorax in visualized lung apices. IMPRESSION: 1. No acute intracranial abnormality. There is mild mucosal thickening with air-fluid level posterior aspect of the left maxillary sinus. No definite acute cortical infarction. 2. No cervical spine acute fracture or subluxation. 3. Multilevel degenerative changes as described above. There is moderate spinal  canal stenosis due to posterior spurring at C5-C6 level. 4. There is thickening of esophageal wall. Findings highly suspicious for reflux esophagitis. Further correlation with endoscopy is recommended. Electronically Signed   By: Natasha Mead M.D.   On: 06/01/2016 09:27   Ct Cervical Spine Wo Contrast  Result Date:  06/01/2016 CLINICAL DATA:  Larey Seat off bed today, hit forehead EXAM: CT HEAD WITHOUT CONTRAST CT CERVICAL SPINE WITHOUT CONTRAST TECHNIQUE: Multidetector CT imaging of the head and cervical spine was performed following the standard protocol without intravenous contrast. Multiplanar CT image reconstructions of the cervical spine were also generated. COMPARISON:  04/17/2016 FINDINGS: CT HEAD FINDINGS No skull fracture is noted. Mild mucosal thickening with air-fluid level posterior aspect of the left maxillary sinus. The mastoid air cells are unremarkable. No acute cortical infarction. No hydrocephalus. The gray and white-matter differentiation is preserved. No mass lesion is noted on this unenhanced scan. CT CERVICAL SPINE FINDINGS Axial images of the cervical spine shows no acute fracture or subluxation. Computer processed images shows no acute fracture or subluxation. There are degenerative changes C1-C2 articulation. Mild disc space flattening with mild anterior spurring at C4-C5 level. There is moderate disc space flattening with mild anterior and moderate posterior spurring at C5-C6 level. Moderate anterior spurring noted lower endplate of C7. Mild anterior spurring lower endplate of T1 vertebral body. There is mild to moderate spinal canal stenosis due to posterior spurring at C5-C6 level. No prevertebral soft tissue swelling. Cervical airway is patent. Please note there is thickening of esophageal wall. Findings are highly suspicious for gastroesophageal reflux. Further correlation with endoscopy is recommended. No pneumothorax in visualized lung apices. IMPRESSION: 1. No acute intracranial abnormality. There is mild mucosal thickening with air-fluid level posterior aspect of the left maxillary sinus. No definite acute cortical infarction. 2. No cervical spine acute fracture or subluxation. 3. Multilevel degenerative changes as described above. There is moderate spinal canal stenosis due to posterior spurring at  C5-C6 level. 4. There is thickening of esophageal wall. Findings highly suspicious for reflux esophagitis. Further correlation with endoscopy is recommended. Electronically Signed   By: Natasha Mead M.D.   On: 06/01/2016 09:27   Dg Shoulder Left  Result Date: 06/01/2016 CLINICAL DATA:  Pain following fall EXAM: LEFT SHOULDER - 2+ VIEW COMPARISON:  December 23, 2015 FINDINGS: Frontal and Y scapular images were obtained. There is no appreciable fracture or dislocation. There is moderate generalized osteoarthritic change. No erosive change. Visualized left lung is clear. IMPRESSION: Moderate osteoarthritic change.  No acute fracture or dislocation. Electronically Signed   By: Bretta Bang III M.D.   On: 06/01/2016 09:19   Dg Hip Unilat W Or Wo Pelvis 2-3 Views Left  Result Date: 06/01/2016 CLINICAL DATA:  Pain following fall EXAM: DG HIP (WITH OR WITHOUT PELVIS) 2-3V LEFT COMPARISON:  None. FINDINGS: Frontal pelvis as well as frontal and lateral left hip images were obtained. There is no apparent fracture or dislocation. There is slight symmetric narrowing of both hip joints. No erosive change. There are multiple foci of vascular calcification in the pelvis. There is postoperative change in the visualize lower lumbar region. IMPRESSION: Mild symmetric narrowing both hip joints. No fracture or dislocation. Areas of atherosclerosis in the pelvic arterial vascular regions. Electronically Signed   By: Bretta Bang III M.D.   On: 06/01/2016 09:18    Assessment/Plan . #1 history of lumbar stenosis status post L4-L5 decompression and fusion. Doing well with physical therapy pain appeared to be relatively well-controlled on  Percocet every 4 hours when necessary as well as Neurontin 300 mg 3 times a day and Cymbalta 60 mg a day she also is receiving a muscle relaxer Flexeril.  #2-history hypertension-this appears stable per manual readings I also discussed this with her therapist who says her blood pressure  is been stable during therapy she has done orthostatics as well as-she does continue on Norvasc and Lopressor at this point will monitor-I do not see frequent systolics below 100 and this may be due more to machine variation than true lower blood pressure.  #3 depression this appears stable on Cymbalta at times she says she feel a little but appears to be doing better in this regards generally upbeat continues to be pleasant.  #4 listed history of hypokalemia she is not on potassium supplementation Will update a metabolic panel to insure stability here.  #5 history of COPD she is on nebulizers as needed this is not really been an issue during her stay here.  #6 history of anxiety she is on diazepam 5 mg 3 times a day when necessary we have been trying to minimize the use here secondary to fall risk and dizziness at this point will monitor.  #7 history of dizziness she does continue on ranitidine as needed-I don't believe this is been needed much but will continue to monitor.  #8 history of GERD she continues on Prilosec this has not been an issue during her stay.  #9 history of question pseudodementia-depression-again she is on Cymbalta this appears to be stable apparently has increased confusion at times-but was pleasant and cooperative and appropriate when I have visited her.  [id number]-of note greater than 35 minutes spent assessing patient-discussing her status with nursing staff as well as with her therapist at bedside-reviewing her chart-her labs-and coordinating and formulating a plan of care  #8 history constipation she is on Senokot-S twice a day this has not been an issue to my knowledge during stay here.

## 2016-06-11 ENCOUNTER — Other Ambulatory Visit (HOSPITAL_COMMUNITY)
Admission: RE | Admit: 2016-06-11 | Discharge: 2016-06-11 | Disposition: A | Discharge status: Home / Self Care | Payer: Medicare Other | Source: Skilled Nursing Facility | Attending: Internal Medicine | Admitting: Internal Medicine

## 2016-06-11 ENCOUNTER — Non-Acute Institutional Stay (SKILLED_NURSING_FACILITY): Payer: Medicare Other | Admitting: Internal Medicine

## 2016-06-11 ENCOUNTER — Emergency Department (HOSPITAL_COMMUNITY)
Admission: EM | Admit: 2016-06-11 | Discharge: 2016-06-12 | Disposition: A | Discharge status: Home / Self Care | Payer: Medicare Other | Attending: Emergency Medicine | Admitting: Emergency Medicine

## 2016-06-11 ENCOUNTER — Encounter (HOSPITAL_COMMUNITY): Payer: Self-pay

## 2016-06-11 DIAGNOSIS — F17210 Nicotine dependence, cigarettes, uncomplicated: Secondary | ICD-10-CM | POA: Insufficient documentation

## 2016-06-11 DIAGNOSIS — J449 Chronic obstructive pulmonary disease, unspecified: Secondary | ICD-10-CM | POA: Insufficient documentation

## 2016-06-11 DIAGNOSIS — D72829 Elevated white blood cell count, unspecified: Secondary | ICD-10-CM | POA: Diagnosis not present

## 2016-06-11 DIAGNOSIS — Z79899 Other long term (current) drug therapy: Secondary | ICD-10-CM | POA: Diagnosis not present

## 2016-06-11 DIAGNOSIS — M2550 Pain in unspecified joint: Secondary | ICD-10-CM | POA: Insufficient documentation

## 2016-06-11 DIAGNOSIS — Z48811 Encounter for surgical aftercare following surgery on the nervous system: Secondary | ICD-10-CM | POA: Insufficient documentation

## 2016-06-11 DIAGNOSIS — I10 Essential (primary) hypertension: Secondary | ICD-10-CM | POA: Diagnosis not present

## 2016-06-11 DIAGNOSIS — Z7982 Long term (current) use of aspirin: Secondary | ICD-10-CM | POA: Insufficient documentation

## 2016-06-11 DIAGNOSIS — Z9181 History of falling: Secondary | ICD-10-CM | POA: Insufficient documentation

## 2016-06-11 DIAGNOSIS — R7989 Other specified abnormal findings of blood chemistry: Secondary | ICD-10-CM | POA: Diagnosis present

## 2016-06-11 DIAGNOSIS — R1013 Epigastric pain: Secondary | ICD-10-CM | POA: Insufficient documentation

## 2016-06-11 DIAGNOSIS — R293 Abnormal posture: Secondary | ICD-10-CM | POA: Insufficient documentation

## 2016-06-11 DIAGNOSIS — D751 Secondary polycythemia: Secondary | ICD-10-CM | POA: Insufficient documentation

## 2016-06-11 LAB — CBC WITH DIFFERENTIAL/PLATELET
BASOS ABS: 0 10*3/uL (ref 0.0–0.1)
BASOS PCT: 0 %
Eosinophils Absolute: 0.5 10*3/uL (ref 0.0–0.7)
Eosinophils Relative: 3 %
HEMATOCRIT: 43.1 % (ref 36.0–46.0)
HEMOGLOBIN: 13.9 g/dL (ref 12.0–15.0)
LYMPHS PCT: 35 %
Lymphs Abs: 5.7 10*3/uL — ABNORMAL HIGH (ref 0.7–4.0)
MCH: 30.2 pg (ref 26.0–34.0)
MCHC: 32.3 g/dL (ref 30.0–36.0)
MCV: 93.7 fL (ref 78.0–100.0)
MONOS PCT: 9 %
Monocytes Absolute: 1.5 10*3/uL — ABNORMAL HIGH (ref 0.1–1.0)
NEUTROS ABS: 8.5 10*3/uL — AB (ref 1.7–7.7)
NEUTROS PCT: 53 %
Platelets: 286 10*3/uL (ref 150–400)
RBC: 4.6 MIL/uL (ref 3.87–5.11)
RDW: 13.5 % (ref 11.5–15.5)
WBC: 16.2 10*3/uL — ABNORMAL HIGH (ref 4.0–10.5)

## 2016-06-11 LAB — BASIC METABOLIC PANEL
ANION GAP: 6 (ref 5–15)
BUN: 13 mg/dL (ref 6–20)
CALCIUM: 8.7 mg/dL — AB (ref 8.9–10.3)
CO2: 25 mmol/L (ref 22–32)
CREATININE: 0.7 mg/dL (ref 0.44–1.00)
Chloride: 105 mmol/L (ref 101–111)
GFR calc non Af Amer: >60 mL/min (ref >60)
Glucose, Bld: 131 mg/dL — ABNORMAL HIGH (ref 65–99)
Potassium: 4.7 mmol/L (ref 3.5–5.1)
SODIUM: 136 mmol/L (ref 135–145)

## 2016-06-11 LAB — URINALYSIS, ROUTINE W REFLEX MICROSCOPIC
Bilirubin Urine: NEGATIVE
Glucose, UA: NEGATIVE mg/dL
Hgb urine dipstick: NEGATIVE
Ketones, ur: NEGATIVE mg/dL
Leukocytes, UA: NEGATIVE
Nitrite: NEGATIVE
Protein, ur: NEGATIVE mg/dL
Specific Gravity, Urine: >1.03 — ABNORMAL HIGH (ref 1.005–1.030)
pH: 5.5 (ref 5.0–8.0)

## 2016-06-11 NOTE — ED Provider Notes (Signed)
[facility] DEPT Provider Note   CSN: [id number] Arrival date & time: 06/11/16  2137  By signing my name below, I, Rosario Adie, attest that this documentation has been prepared under the direction and in the presence of Raeford Razor, MD. Electronically Signed: Rosario Adie, ED Scribe. 06/11/16. 10:20 PM.  History   Chief Complaint Chief Complaint  Patient presents with  . Abnormal Lab   The history is provided by the patient and medical records. No language interpreter was used.    HPI Comments: Carla [name] is a 65 y.o. female who is ~1 month s/p Posterior Lumbar Interbody Fusion, who presents to the Emergency Department for evaluation. There are no associated symptoms. Pt reports that she was being seen by Dr Dutch Quint at [facility] for a f/u appointment s/p back surgery. Pt reports that at her appointment they checked her WBC three times and each time it was elevated. Her blood work was otherwise unremarkable. She has been attending PT since her back surgery, and notes that her back pain has improved. She denies any worsening back pain over the past few days. No recent bursts of steroids. Normal food and fluid intake. Denies nausea, vomiting, fever, abdominal pain, dysuria, hematuria, frequency, urgency, cough, SOB, wheezing, pain otherwise, or any other associated symptoms.   Past Medical History:  Diagnosis Date  . Anxiety   . Arthritis   . Bronchitis   . Chronic back pain   . Chronic knee pain   . COPD (chronic obstructive pulmonary disease) (HCC)   . 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 Dizziness   . Frequent falls   . GERD (gastroesophageal reflux disease)   . Hypertension   . Memory difficulty 2015  . Neuropathy (HCC)   . Pain management   . Prediabetes   . 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 Shortness of breath dyspnea    with exertion  . 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 Weakness of both legs    Patient Active Problem List   Diagnosis Date Noted  . Contusion, multiple sites 06/01/2016  . Degenerative spondylolisthesis 05/09/2016  . Intractable nausea and vomiting 03/10/2016  . Abdominal pain 03/10/2016  . Abnormal thyroid function test 03/08/2016  . Chest pain, unspecified 03/07/2016  . Epigastric abdominal pain 03/07/2016  . Cholecystitis, acute 03/07/2016  . Chronic pain syndrome 03/07/2016  . Prediabetes 03/07/2016  . Obesity, morbid (HCC) 03/07/2016  . Elevated troponin 12/21/2015  . Fall 12/21/2015  . 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 Erythrocytosis   . Essential hypertension 06/29/2015  . 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 Anxiety attack 03/08/2012  . COPD (chronic obstructive pulmonary disease) (HCC) 03/05/2012   Past Surgical History:  Procedure Laterality Date  . APPENDECTOMY  1971  . BACK SURGERY     Lumbar Fusion  . CHOLECYSTECTOMY N/A 03/12/2016   Procedure: LAPAROSCOPIC CHOLECYSTECTOMY;  Surgeon: Ancil Linsey, MD;  Location: [facility];  Service: General;  Laterality: N/A;  . KNEE CARTILAGE SURGERY Right 2007  . TONSILLECTOMY  1971   OB History    Gravida Para Term Preterm AB Living   2 2 2          SAB TAB Ectopic Multiple Live Births                 Home Medications    Prior to Admission medications   Medication Sig Start Date  End Date Taking? Authorizing Provider  albuterol (PROVENTIL HFA;VENTOLIN HFA) 108 (90 BASE) MCG/ACT inhaler Inhale 2 puffs into the lungs every 6 (six) hours as needed for wheezing. 06/21/13   Nimish Normajean Glasgow, MD  amLODipine (NORVASC) 2.5 MG tablet Take 2 tablets (5 mg total) by mouth daily. 06/30/15   Otis Brace, MD  aspirin EC 81 MG tablet Take 81 mg by mouth daily.    Historical Provider, MD  Cholecalciferol (VITAMIN D) 2000 units CAPS Take 2,000 Units by mouth daily.     Historical Provider, MD    cyclobenzaprine (FLEXERIL) 10 MG tablet Take 10 mg by mouth 3 (three) times daily as needed for muscle spasms.    Historical Provider, MD  diazepam (VALIUM) 5 MG tablet Take 1 tablet (5 mg total) by mouth 3 (three) times daily as needed for anxiety. 05/13/16   Julio Sicks, MD  dimenhyDRINATE (DRAMAMINE) 50 MG tablet Take 50 mg by mouth every 8 (eight) hours as needed for dizziness.    Historical Provider, MD  DULoxetine (CYMBALTA) 60 MG capsule Take 60 mg by mouth daily.     Historical Provider, MD  EVZIO 0.4 MG/0.4ML SOAJ Inject 0.4 mLs into the muscle once as needed (for Anaphylaxis/allergic reaction).  02/19/15   Historical Provider, MD  gabapentin (NEURONTIN) 300 MG capsule Take 300 mg by mouth 3 (three) times daily.    Historical Provider, MD  hydrOXYzine (VISTARIL) 25 MG capsule Take 25 mg by mouth at bedtime.    Historical Provider, MD  Magnesium Hydroxide (MILK OF MAGNESIA PO) 30 ml by mouth as needed for constipation    Historical Provider, MD  Menthol, Topical Analgesic, (BIOFREEZE) 4 % GEL Apply to  Patients POC PRN    Historical Provider, MD  metoprolol succinate (TOPROL-XL) 50 MG 24 hr tablet Take 50 mg by mouth at bedtime. 12/14/15   Historical Provider, MD  omeprazole (PRILOSEC) 20 MG capsule Take 1 capsule (20 mg total) by mouth daily. 06/30/15   Otis Brace, MD  ondansetron (ZOFRAN ODT) 8 MG disintegrating tablet Take 1 tablet (8 mg total) by mouth every 8 (eight) hours as needed for nausea or vomiting. 03/13/16   Erick Blinks, MD  oxyCODONE-acetaminophen (PERCOCET) 10-325 MG tablet Take 1 tablet by mouth every 4 (four) hours as needed for pain. Max APAP 3gm/24 hrs from all sources 06/05/16   Tiffany L Reed, DO  senna-docusate (SENOKOT S) 8.6-50 MG tablet Take 1 tablet by mouth 2 (two) times daily.    Historical Provider, MD  VOLTAREN 1 % GEL Apply 4 g topically every 8 (eight) hours.  02/06/15   Historical Provider, MD   Family History Family History  Problem Relation Age of Onset  .  Heart failure Father   . Diabetes Father   . Kidney failure Father    Social History Social History  Substance Use Topics  . Smoking status: Current Every Day Smoker    Packs/day: 0.50    Years: 25.00    Types: Cigarettes    Start date: 08/05/1970  . Smokeless tobacco: Never Used     Comment: 04/30/15 less than 1 PPD  . Alcohol use No     Comment: Very rare   Allergies   Bee venom; Coconut flavor; Mushroom extract complex; Amitriptyline; Toradol [ketorolac tromethamine]; Tramadol; and Trazodone and nefazodone  Review of Systems Review of Systems  Constitutional: Negative for fever.  HENT: Negative for ear pain.   Eyes: Negative for pain.  Respiratory: Negative for cough, shortness of breath  and wheezing.   Cardiovascular: Negative for chest pain.  Gastrointestinal: Negative for abdominal pain, nausea, rectal pain and vomiting.  Genitourinary: Negative for dysuria, flank pain, frequency, hematuria, pelvic pain, urgency and vaginal pain.  Musculoskeletal: Negative for arthralgias, back pain, myalgias and neck pain.  Neurological: Negative for headaches.  All other systems reviewed and are negative.  Physical Exam Updated Vital Signs BP 141/72 (BP Location: Left Arm)   Pulse 69   Temp 97.3 F (36.3 C) (Oral)   Resp 20   Ht 5\' 4"  (1.626 m)   Wt 215 lb (97.5 kg)   SpO2 100%   BMI 36.90 kg/m   Physical Exam  Constitutional: She appears well-developed and well-nourished.  HENT:  Head: Normocephalic and atraumatic.  Eyes: Conjunctivae are normal. Right eye exhibits no discharge. Left eye exhibits no discharge.  Cardiovascular: Normal rate, regular rhythm and normal heart sounds.   No murmur heard. Pulmonary/Chest: Effort normal and breath sounds normal. No respiratory distress. She has no wheezes. She has no rales.  Abdominal: Soft. She exhibits no distension and no mass. There is no tenderness. There is no rebound and no guarding.  Musculoskeletal: Normal range of  motion.  Neurological: She is alert. Coordination normal.  Skin: Skin is warm and dry. No rash noted. She is not diaphoretic. No erythema.  Psychiatric: She has a normal mood and affect. Her behavior is normal.  Nursing note and vitals reviewed.  ED Treatments / Results  DIAGNOSTIC STUDIES: Oxygen Saturation is 100% on RA, normal by my interpretation.   COORDINATION OF CARE: 10:17 PM-Discussed next steps with pt. Pt verbalized understanding and is agreeable with the plan.   Labs (all labs ordered are listed, but only abnormal results are displayed) Labs Reviewed - No data to display  Radiology No results found.  Procedures Procedures   Medications Ordered in ED Medications - No data to display  Initial Impression / Assessment and Plan / ED Course  I have reviewed the triage vital signs and the nursing notes.  Pertinent labs & imaging results that were available during my care of the patient were reviewed by me and considered in my medical decision making (see chart for details).  Clinical Course   64yF sent for evaluation of leukocytosis of 16,000. She has no acute complaints. She is afebrile. Appears well. Back has been improving since surgery. Looks like it is healing well on exam. I do not see much need for an emergent work-up. Will check UA. I think this is something that can otherwise be monitored and she can have further w/u if she would develop symptoms.   Final Clinical Impressions(s) / ED Diagnoses   Final diagnoses:  Leukocytosis    New Prescriptions New Prescriptions   No medications on file    I personally preformed the services scribed in my presence. The recorded information has been reviewed is accurate. Raeford RazorStephen Jamieon Lannen, MD.     Raeford RazorStephen Jone Panebianco, MD 06/25/16 [id number]

## 2016-06-11 NOTE — Discharge Instructions (Addendum)
Her urinalysis does not show an infection. Her only new complaint is her underlying osteoarthritis has flared up this week. Please discuss with Dr Chales Abrahams how he/she wants to treat this. Monitor her for fever and recheck if she develops any new specific symptoms.

## 2016-06-11 NOTE — Progress Notes (Signed)
This is an acute visit.  Level care skilled.  Facility [facility].  Chief complaint acute visit follow-up leukocytosis.  History of present illness.  Patient is a pleasant 65 year old female initially admitted here for rehabilitation after an L4-L5 decompression and fusion for symptomatic lumbar stenosis-she has been working with physical therapy is doing fairly well.  She was seen for routine visit yesterday and routine lab work was ordered which came back surprisingly  with a white count of 16,200 there is no differential listed.  She is not complaining of any fever or chills-she denies any dysuria cough or congestion-nursing staff does not report any issues which would lead one believed she has a septic process.  The surgical site lower back appears to be healing fairly unremarkably-she appears to be in good spirits today sitting comfortably in her wheelchair does not really show evidence of any overt of infection  She says her joints hurt some but apparently this is somewhat chronic and not an acute change       Past Medical History:  Diagnosis Date  . Anxiety   . Arthritis   . Bronchitis   . Chronic back pain   . Chronic knee pain   . COPD (chronic obstructive pulmonary disease) (HCC)   . 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 Dizziness   . Frequent falls   . GERD (gastroesophageal reflux disease)   . Hypertension   . Memory difficulty 2015  . Neuropathy (HCC)   . Pain management   . Prediabetes   . 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 Shortness of breath dyspnea    with exertion  . 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 Weakness of both legs         Past Surgical History:  Procedure Laterality Date  . APPENDECTOMY  1971  . BACK SURGERY     Lumbar Fusion  . CHOLECYSTECTOMY N/A 03/12/2016   Procedure: LAPAROSCOPIC  CHOLECYSTECTOMY;  Surgeon: Ancil Linsey, MD;  Location: [facility];  Service: General;  Laterality: N/A;  . KNEE CARTILAGE SURGERY Right 2007  . TONSILLECTOMY  1971         Allergies  Allergen Reactions  . Bee Venom Anaphylaxis  . Coconut Flavor Anaphylaxis    Per patient  . Mushroom Extract Complex Anaphylaxis    Extreme sweating, vomiting  . Amitriptyline Other (See Comments)    Has nightmares when using, not in right state of mind   . Toradol [Ketorolac Tromethamine] Nausea And Vomiting  . Tramadol Nausea And Vomiting  . Trazodone And Nefazodone Nausea And Vomiting          Medication List           Accurate as of 06/10/16  2:55 PM. Always use your most recent med list.           albuterol 108 (90 Base) MCG/ACT inhaler Commonly known as:  PROVENTIL HFA;VENTOLIN HFA Inhale 2 puffs into the lungs every 6 (six) hours as needed for wheezing.  amLODipine 2.5 MG tablet Commonly known as:  NORVASC Take 2 tablets (5 mg total) by mouth daily.  aspirin EC 81 MG tablet Take 81 mg by mouth daily.  BIOFREEZE 4 % Gel Generic drug:  Menthol (Topical Analgesic) Apply to  Patients POC PRN  cyclobenzaprine 10 MG tablet Commonly known as:  FLEXERIL Take 10 mg by mouth 3 (three) times daily as needed for muscle spasms.  diazepam  5 MG tablet Commonly known as:  VALIUM Take 1 tablet (5 mg total) by mouth 3 (three) times daily as needed for anxiety.  dimenhyDRINATE 50 MG tablet Commonly known as:  DRAMAMINE Take 50 mg by mouth every 8 (eight) hours as needed for dizziness.  DULoxetine 60 MG capsule Commonly known as:  CYMBALTA Take 60 mg by mouth daily.  EVZIO 0.4 MG/0.4ML Soaj Generic drug:  Naloxone HCl Inject 0.4 mLs into the muscle once as needed (for Anaphylaxis/allergic reaction).  gabapentin 300 MG capsule Commonly known as:  NEURONTIN Take 300 mg by mouth 3 (three) times daily.  hydrOXYzine 25 MG capsule Commonly known as:  VISTARIL Take 25 mg by  mouth at bedtime.  metoprolol succinate 50 MG 24 hr tablet Commonly known as:  TOPROL-XL Take 50 mg by mouth at bedtime.  MILK OF MAGNESIA PO 30 ml by mouth as needed for constipation  omeprazole 20 MG capsule Commonly known as:  PRILOSEC Take 1 capsule (20 mg total) by mouth daily.  ondansetron 8 MG disintegrating tablet Commonly known as:  ZOFRAN ODT Take 1 tablet (8 mg total) by mouth every 8 (eight) hours as needed for nausea or vomiting.  oxyCODONE-acetaminophen 10-325 MG tablet Commonly known as:  PERCOCET Take 1 tablet by mouth every 4 (four) hours as needed for pain. Max APAP 3gm/24 hrs from all sources  SENOKOT S 8.6-50 MG tablet Generic drug:  senna-docusate Take 1 tablet by mouth 2 (two) times daily.  Vitamin D 2000 units Caps Take 2,000 Units by mouth daily.  VOLTAREN 1 % Gel Generic drug:  diclofenac sodium Apply 4 g topically every 8 (eight) hours.      Review of Systems   In general  no complaints of fever or chills.  Skin is not complaining of rashes or itching has surgical site low back   Head ears eyes nose mouth and throat does not complaining of any visual changes or sore throat.  Resp---no complaints of cough or shortness of breath she does have a history COPD. has when necessary nebulizers  Cardiac does not complaining of chest pain  GI is not complaining of abdominal discomfort nausea vomiting diarrhea constipation.  You denies dysuria -- burning with urination  Muscle skeletal has an extensive history here of discomfort but is not complaining of acute pain currently doing well with therapy per discussion with her therapist.--Says her joints tend to her general  Neurologic is not complaining currently of dizziness or headache does have some history of somewhat chronic dizziness it appears.  Psych is not complaining of overt depression or anxiety-but does apparently have a history of anxiety she is on diazepam when necessary.         Immunization History  Administered Date(s) Administered  . Influenza,inj,Quad PF,36+ Mos 06/21/2013       Pertinent  Health Maintenance Due  Topic Date Due  . PAP SMEAR  08/05/1974  . MAMMOGRAM  08/06/2003  . COLONOSCOPY  08/06/2003  . INFLUENZA VACCINE  05/13/2016   Fall Risk  07/16/2015 04/30/2015  Falls in the past year? Yes Yes  Number falls in past yr: 2 or more 2 or more  Injury with Fall? Yes No  Risk Factor Category  High Fall Risk -  Risk for fall due to : History of fall(s);Impaired mobility;Impaired balance/gait;Other (Comment) Impaired mobility  Risk for fall due to (comments): - "knees give out, get dizzy"  Follow up Falls prevention discussed -   Functional Status Survey:  Temperature is 98.0 pulse 76 respirations 20 blood pressure 122/72     Physical Exam In general this is a pleasant late middle-aged female in no distress sitting comfortably in her chair  Her skin is warm and dry she does a well-healed surgical scar midline low back.-There is some quite minimal tenderness to palpation around the site it appears to be dried  healing unremarkably no active drainage--very minimal slight open area mid site but this does not appear acutely concerning  Eyes pupils appear reactive light sclerae and conjunctivae clear visual acuity appears grossly intact.  Oropharynx clear mucous membranes moist.  Chest is clear to auscultation there is no labored breathing.  Heart is regular rate and rhythm without murmur gallop or rub She has trace lower extremity edema bilaterally.  Abdomen is somewhat obese soft nontender there are positive bowel sounds.  Musculoskeletal is able to move all extremities 4  Is able to stand without assistance-and ambulate she is doing well with therapy.   Neurologic is grossly intact her speech is clear no lateralizing findings  Psych-she is grossly oriented and conversant apparently does have some confusion at times which  is not new--was conversant today  And cooperative and very patient with exam  Labs. 06/11/2016.  WBC 16.2 hemoglobin 13.9 platelets 286.  Sodium 136 potassium 4.7 BUN 13 creatinine 0.7  Assessment plan.  #1 leukocytosis-is a significant change from prior labs-she does not really exhibit any signs of infection overtly such as cough congestion fever chills denies dysuria.  Will order a stat CBC with differential to ensure the accuracy of the original lab. Before starting additional investigation such as a chest x-ray and urine culture   Addendum - later this evening I did call to see on the progress of her labs-apparently  labs were not obtainable-apparently lab stated they did not to their knowledge receive the labs-secondary to the significantly elevated white count would like to get some Clarity on this this  evening-will send her to the ER to get labs-and appropriate studies if indicated.--  [id number]

## 2016-06-11 NOTE — ED Notes (Signed)
Pt states she is unable to urinate at this time.  Given soft drink and explained to pt to use call bell to alert staff when she has to urinate.

## 2016-06-11 NOTE — ED Triage Notes (Signed)
Pt. From [facility] due to abnormal labs per staff pt. Has elevated WBC pt. Denies complaints

## 2016-06-12 ENCOUNTER — Non-Acute Institutional Stay (SKILLED_NURSING_FACILITY): Payer: Medicare Other | Admitting: Internal Medicine

## 2016-06-12 ENCOUNTER — Encounter: Payer: Self-pay | Admitting: Internal Medicine

## 2016-06-12 DIAGNOSIS — I10 Essential (primary) hypertension: Secondary | ICD-10-CM | POA: Diagnosis not present

## 2016-06-12 DIAGNOSIS — D72829 Elevated white blood cell count, unspecified: Secondary | ICD-10-CM

## 2016-06-12 DIAGNOSIS — R42 Dizziness and giddiness: Secondary | ICD-10-CM

## 2016-06-12 LAB — CBC WITH DIFFERENTIAL/PLATELET
Basophils Absolute: 0.1 10*3/uL (ref 0.0–0.1)
Basophils Relative: 0 %
Eosinophils Absolute: 0.5 10*3/uL (ref 0.0–0.7)
Eosinophils Relative: 3 %
HCT: 41.9 % (ref 36.0–46.0)
Hemoglobin: 13.5 g/dL (ref 12.0–15.0)
Lymphocytes Relative: 45 %
Lymphs Abs: 6.7 10*3/uL — ABNORMAL HIGH (ref 0.7–4.0)
MCH: 29.7 pg (ref 26.0–34.0)
MCHC: 32.2 g/dL (ref 30.0–36.0)
MCV: 92.1 fL (ref 78.0–100.0)
Monocytes Absolute: 1.3 10*3/uL — ABNORMAL HIGH (ref 0.1–1.0)
Monocytes Relative: 9 %
Neutro Abs: 6.5 10*3/uL (ref 1.7–7.7)
Neutrophils Relative %: 43 %
Platelets: 276 10*3/uL (ref 150–400)
RBC: 4.55 MIL/uL (ref 3.87–5.11)
RDW: 13.3 % (ref 11.5–15.5)
WBC: 15 10*3/uL — ABNORMAL HIGH (ref 4.0–10.5)

## 2016-06-12 LAB — BASIC METABOLIC PANEL
Anion gap: 9 (ref 5–15)
BUN: 13 mg/dL (ref 6–20)
CO2: 28 mmol/L (ref 22–32)
Calcium: 9.1 mg/dL (ref 8.9–10.3)
Chloride: 97 mmol/L — ABNORMAL LOW (ref 101–111)
Creatinine, Ser: 0.59 mg/dL (ref 0.44–1.00)
GFR calc Af Amer: >60 mL/min (ref >60)
GFR calc non Af Amer: >60 mL/min (ref >60)
Glucose, Bld: 115 mg/dL — ABNORMAL HIGH (ref 65–99)
Potassium: 3.9 mmol/L (ref 3.5–5.1)
Sodium: 134 mmol/L — ABNORMAL LOW (ref 135–145)

## 2016-06-12 NOTE — ED Provider Notes (Addendum)
00:15 Pt left at change of shift to get results of her UA. Pt has been in [facility] since back surgery on July 28th for rehab with intent to go home. Reports has joint aches in all her joints from her knees (needs bilateral TKR) which is typical of her arthritis that started this week (we had a temperature change with the highs about 65 2 days ago). She had blood work done today on a routine basis and her white blood cell count was noted to be elevated at 16K at 2:30 this afternoon. She reports they did her blood again at 8 PM however that is not in our system. She was sent to the ED for evaluation. UA was done.  [facility] Physician  Dr Einar CrowAnjali Gupta  Patient's noted to have moderate joint swelling sleep of her joints of her fingers, she states her wrists hurt also but they do not appear to be swollen. There is no redness of the skin noted. Her joints look like severe osteoarthritis. She does not have ulnar deviation to suggest rheumatoid arthritis.  Results for orders placed or performed during the hospital encounter of 06/11/16  Urinalysis, Routine w reflex microscopic (not at [facility])  Result Value Ref Range   Color, Urine YELLOW YELLOW   APPearance CLEAR CLEAR   Specific Gravity, Urine >1.030 (H) 1.005 - 1.030   pH 5.5 5.0 - 8.0   Glucose, UA NEGATIVE NEGATIVE mg/dL   Hgb urine dipstick NEGATIVE NEGATIVE   Bilirubin Urine NEGATIVE NEGATIVE   Ketones, ur NEGATIVE NEGATIVE mg/dL   Protein, ur NEGATIVE NEGATIVE mg/dL   Nitrite NEGATIVE NEGATIVE   Leukocytes, UA NEGATIVE NEGATIVE  CBC with Differential  Result Value Ref Range   WBC 15.0 (H) 4.0 - 10.5 K/uL   RBC 4.55 3.87 - 5.11 MIL/uL   Hemoglobin 13.5 12.0 - 15.0 g/dL   HCT 16.141.9 09.636.0 - 04.546.0 %   MCV 92.1 78.0 - 100.0 fL   MCH 29.7 26.0 - 34.0 pg   MCHC 32.2 30.0 - 36.0 g/dL   RDW 40.913.3 81.111.5 - 91.415.5 %   Platelets 276 150 - 400 K/uL   Neutrophils Relative % 43 %   Neutro Abs 6.5 1.7 - 7.7 K/uL   Lymphocytes Relative 45 %   Lymphs Abs 6.7 (H)  0.7 - 4.0 K/uL   Monocytes Relative 9 %   Monocytes Absolute 1.3 (H) 0.1 - 1.0 K/uL   Eosinophils Relative 3 %   Eosinophils Absolute 0.5 0.0 - 0.7 K/uL   Basophils Relative 0 %   Basophils Absolute 0.1 0.0 - 0.1 K/uL   WBC Morphology WHITE COUNT CONFIRMED ON SMEAR   Basic metabolic panel  Result Value Ref Range   Sodium 134 (L) 135 - 145 mmol/L   Potassium 3.9 3.5 - 5.1 mmol/L   Chloride 97 (L) 101 - 111 mmol/L   CO2 28 22 - 32 mmol/L   Glucose, Bld 115 (H) 65 - 99 mg/dL   BUN 13 6 - 20 mg/dL   Creatinine, Ser 7.820.59 0.44 - 1.00 mg/dL   Calcium 9.1 8.9 - 95.610.3 mg/dL   GFR calc non Af Amer >60 >60 mL/min   GFR calc Af Amer >60 >60 mL/min   Anion gap 9 5 - 15   Laboratory interpretation all normal except concentrated UA    Dg Ribs Unilateral W/chest Left  Result Date: 06/01/2016 CLINICAL DATA:  Pain following fall  IMPRESSION: No appreciable rib fracture. No edema or consolidation. No pneumothorax evident.  Electronically Signed   By: Bretta Bang III M.D.   On: 06/01/2016 09:21   Ct Head Wo Contrast  Ct Cervical Spine Wo Contrast  Result Date: 06/01/2016 CLINICAL DATA:  Larey Seat off bed today, hit forehead . IMPRESSION: 1. No acute intracranial abnormality. There is mild mucosal thickening with air-fluid level posterior aspect of the left maxillary sinus. No definite acute cortical infarction. 2. No cervical spine acute fracture or subluxation. 3. Multilevel degenerative changes as described above. There is moderate spinal canal stenosis due to posterior spurring at C5-C6 level. 4. There is thickening of esophageal wall. Findings highly suspicious for reflux esophagitis. Further correlation with endoscopy is recommended. Electronically Signed   By: Natasha Mead M.D.   On: 06/01/2016 09:27   Dg Shoulder Left  Result Date: 06/01/2016 CLINICAL DATA:  Pain following fall . IMPRESSION: Moderate osteoarthritic change.  No acute fracture or dislocation. Electronically Signed   By: Bretta Bang III M.D.   On: 06/01/2016 09:19   Dg Hip Unilat W Or Wo Pelvis 2-3 Views Left  Result Date: 06/01/2016 CLINICAL DATA:  Pain following fall . IMPRESSION: Mild symmetric narrowing both hip joints. No fracture or dislocation. Areas of atherosclerosis in the pelvic arterial vascular regions. Electronically Signed   By: Bretta Bang III M.D.   On: 06/01/2016 09:18    At this point patient has an elevated WBC of unknown etiology.  She has been afebrile with only new symptoms of her joints hurting with flare up of her underlying arthritis. She will be sent back to her nursing home to continue her treatment.   01:00 AM when nurse called her facility to send her back they refused, stating they wanted another CBC and BMET to be done and possibly a CXR.   02:40 AM pt again verifies she has no cough, no SOB, no chest pain. CXR not indicated at this time. Pt sent back to her facility. Her WBC may be related to her arthritis flare-up.    Diagnoses that have been ruled out:  None  Diagnoses that are still under consideration:  None  Final diagnoses:  Leukocytosis  Joint pain    Plan discharge  Devoria Albe, MD, Concha Pyo, MD 06/12/16 [id number]    Devoria Albe, MD 06/12/16 [id number]

## 2016-06-12 NOTE — ED Notes (Signed)
Gave report to nurse at [facility].  Nurse states she is sending someone to come get the patient

## 2016-06-12 NOTE — Progress Notes (Signed)
Location:   [facility] Room Number: 130/P Place of Service:  SNF (31) Provider:  Victorino DecemberAnjali,Lawrence Roldan  BLUTH, KIRK, MD  Patient Care Team: Ernestine ConradKirk Bluth, MD as PCP - General (Family Medicine)  Extended Emergency Contact Information Primary Emergency Contact: Bray,Cheri Address: [address]          [place], [place] [address] [place] Home Phone: [phone] Mobile Phone: [phone] Relation: Daughter Secondary Emergency Contact: Leta JunglingBray,David          [place], [place] [address] [place] Home Phone: [phone] Relation: Other  Code Status:  Full Code Goals of care: Advanced Directive information Advanced Directives 06/12/2016  Does patient have an advance directive? Yes  Type of Advance Directive (No Data)  Does patient want to make changes to advanced directive? No - Patient declined  Copy of advanced directive(s) in chart? Yes  Would patient like information on creating an advanced directive? -  Pre-existing out of facility DNR order (yellow form or pink MOST form) -     Chief Complaint  Patient presents with  . Acute Visit    HPI:  Pt is a 65 y.o. female seen today for an acute visit for Leucocytosis and dizziness.Patient had incidental finding of leucocytosis and was send to ED yesterday night. Her CX Ray and UA was all negative. Patient is not C/O any fever or chills.She is not having any dysuria. She does have occasional dizziness. When she tries to work with therapist. She also says her pain is better since surgery.   Past Medical History:  Diagnosis Date  . Anxiety   . Arthritis   . Bronchitis   . Chronic back pain   . Chronic knee pain   . COPD (chronic obstructive pulmonary disease) (HCC)   . 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 Dizziness   . Frequent falls   . GERD (gastroesophageal reflux disease)   . Hypertension   . Memory difficulty 2015  . Neuropathy (HCC)   . Pain management   . Prediabetes   .  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 Shortness of breath dyspnea    with exertion  . 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 Weakness of both legs    Past Surgical History:  Procedure Laterality Date  . APPENDECTOMY  1971  . BACK SURGERY     Lumbar Fusion  . CHOLECYSTECTOMY N/A 03/12/2016   Procedure: LAPAROSCOPIC CHOLECYSTECTOMY;  Surgeon: Ancil LinseyJason Evan Davis, MD;  Location: [facility];  Service: General;  Laterality: N/A;  . KNEE CARTILAGE SURGERY Right 2007  . TONSILLECTOMY  1971    Allergies  Allergen Reactions  . Bee Venom Anaphylaxis  . Coconut Flavor Anaphylaxis    Per patient  . Mushroom Extract Complex Anaphylaxis    Extreme sweating, vomiting  . Amitriptyline Other (See Comments)    Has nightmares when using, not in right state of mind   . Toradol [Ketorolac Tromethamine] Nausea And Vomiting  . Tramadol Nausea And Vomiting  . Trazodone And Nefazodone Nausea And Vomiting      Medication List       Accurate as of 06/12/16  2:35 PM. Always use your most recent med list.          albuterol 108 (90 Base) MCG/ACT inhaler Commonly known as:  PROVENTIL HFA;VENTOLIN HFA Inhale 2 puffs into the lungs every 6 (six) hours as needed for wheezing.   amLODipine 2.5  MG tablet Commonly known as:  NORVASC Take 2 tablets (5 mg total) by mouth daily.   aspirin EC 81 MG tablet Take 81 mg by mouth daily.   BIOFREEZE 4 % Gel Generic drug:  Menthol (Topical Analgesic) Apply 1 application topically daily as needed (for pain-knees and back). Apply to  Patients POC PRN   cyclobenzaprine 10 MG tablet Commonly known as:  FLEXERIL Take 10 mg by mouth 3 (three) times daily.   diazepam 5 MG tablet Commonly known as:  VALIUM Take 1 tablet (5 mg total) by mouth 3 (three) times daily as needed for anxiety.   dimenhyDRINATE 50 MG tablet Commonly known as:  DRAMAMINE Take 50 mg by mouth  every 8 (eight) hours as needed for dizziness.   DULoxetine 60 MG capsule Commonly known as:  CYMBALTA Take 60 mg by mouth daily.   EVZIO 0.4 MG/0.4ML Soaj Generic drug:  Naloxone HCl Inject 0.4 mLs into the muscle once as needed (for Anaphylaxis/allergic reaction).   gabapentin 300 MG capsule Commonly known as:  NEURONTIN Take 300 mg by mouth 3 (three) times daily.   hydrOXYzine 25 MG capsule Commonly known as:  VISTARIL Take 25 mg by mouth at bedtime.   metoprolol succinate 50 MG 24 hr tablet Commonly known as:  TOPROL-XL Take 50 mg by mouth at bedtime.   MILK OF MAGNESIA PO Give 30 ml by mouth as needed for constipation   omeprazole 20 MG capsule Commonly known as:  PRILOSEC Take 1 capsule (20 mg total) by mouth daily.   ondansetron 8 MG disintegrating tablet Commonly known as:  ZOFRAN ODT Take 1 tablet (8 mg total) by mouth every 8 (eight) hours as needed for nausea or vomiting.   oxyCODONE-acetaminophen 10-325 MG tablet Commonly known as:  PERCOCET Take 1 tablet by mouth every 4 (four) hours as needed for pain. Max APAP 3gm/24 hrs from all sources   SENOKOT S 8.6-50 MG tablet Generic drug:  senna-docusate Take 1 tablet by mouth 2 (two) times daily.   Vitamin D 2000 units Caps Take 2,000 Units by mouth daily.   VOLTAREN 1 % Gel Generic drug:  diclofenac sodium Apply 4 g topically every 8 (eight) hours.       Review of Systems  Respiratory: Negative.   Cardiovascular: Negative.   Genitourinary: Negative.   Neurological: Positive for dizziness, weakness and light-headedness.    Immunization History  Administered Date(s) Administered  . Influenza,inj,Quad PF,36+ Mos 06/21/2013   Pertinent  Health Maintenance Due  Topic Date Due  . PAP SMEAR  08/05/1974  . MAMMOGRAM  08/06/2003  . COLONOSCOPY  08/06/2003  . INFLUENZA VACCINE  09/12/2016 (Originally 05/13/2016)   Fall Risk  07/16/2015 04/30/2015  Falls in the past year? Yes Yes  Number falls in past  yr: 2 or more 2 or more  Injury with Fall? Yes No  Risk Factor Category  High Fall Risk -  Risk for fall due to : History of fall(s);Impaired mobility;Impaired balance/gait;Other (Comment) Impaired mobility  Risk for fall due to (comments): - "knees give out, get dizzy"  Follow up Falls prevention discussed -   Functional Status Survey:    Vitals:   06/12/16 1435  BP: 101/60  Pulse: (!) 48  Resp: 17  Temp: 97.4 F (36.3 C)  TempSrc: Oral   There is no height or weight on file to calculate BMI. Physical Exam  Constitutional: She is oriented to person, place, and time. She appears well-developed and well-nourished.  HENT:  Head: Normocephalic.  Eyes: Pupils are equal, round, and reactive to light.  Neck: Neck supple.  Cardiovascular: Normal rate and regular rhythm.   Pulmonary/Chest: Breath sounds normal.  Abdominal: Soft. Bowel sounds are normal.  Musculoskeletal: She exhibits no edema.  Incision Site looks good and healing.  Neurological: She is alert and oriented to person, place, and time.    Labs reviewed:  Recent Labs  06/29/15 1441  05/02/16 0946 06/11/16 0730 06/12/16 0122  NA  --   < > 137 136 134*  K  --   < > 4.0 4.7 3.9  CL  --   < > 103 105 97*  CO2  --   < > 28 25 28   GLUCOSE  --   < > 98 131* 115*  BUN  --   < > 13 13 13   CREATININE  --   < > 0.74 0.70 0.59  CALCIUM  --   < > 9.4 8.7* 9.1  MG 1.8  --   --   --   --   PHOS 2.8  --   --   --   --   < > = values in this interval not displayed.  Recent Labs  03/31/16 1419 04/12/16 2142 04/17/16 1302  AST 26 25 23   ALT 18 20 19   ALKPHOS 94 98 91  BILITOT 0.9 0.8 0.7  PROT 7.7 7.4 7.1  ALBUMIN 4.0 4.1 3.9    Recent Labs  05/02/16 0946 06/11/16 1430 06/12/16 0122  WBC 9.7 16.2* 15.0*  NEUTROABS 4.4 8.5* 6.5  HGB 14.8 13.9 13.5  HCT 46.5* 43.1 41.9  MCV 93.4 93.7 92.1  PLT 274 286 276   Lab Results  Component Value Date   TSH 3.083 03/10/2016   Lab Results  Component Value Date     HGBA1C 6.5 (H) 03/07/2016   No results found for: CHOL, HDL, LDLCALC, LDLDIRECT, TRIG, CHOLHDL  Significant Diagnostic Results in last 30 days:  Dg Ribs Unilateral W/chest Left  Result Date: 06/01/2016 CLINICAL DATA:  Pain following fall EXAM: LEFT RIBS AND CHEST - 3+ VIEW COMPARISON:  Chest radiograph Mar 10, 2016 FINDINGS: Frontal chest as well as oblique and cone-down lower rib images were obtained. Lungs are clear. Heart size and pulmonary vascularity are normal. No adenopathy. No demonstrable pneumothorax or pleural effusion. There is no appreciable rib fracture. IMPRESSION: No appreciable rib fracture. No edema or consolidation. No pneumothorax evident. Electronically Signed   By: Bretta Bang III M.D.   On: 06/01/2016 09:21   Ct Head Wo Contrast  Result Date: 06/01/2016 CLINICAL DATA:  Larey Seat off bed today, hit forehead EXAM: CT HEAD WITHOUT CONTRAST CT CERVICAL SPINE WITHOUT CONTRAST TECHNIQUE: Multidetector CT imaging of the head and cervical spine was performed following the standard protocol without intravenous contrast. Multiplanar CT image reconstructions of the cervical spine were also generated. COMPARISON:  04/17/2016 FINDINGS: CT HEAD FINDINGS No skull fracture is noted. Mild mucosal thickening with air-fluid level posterior aspect of the left maxillary sinus. The mastoid air cells are unremarkable. No acute cortical infarction. No hydrocephalus. The gray and white-matter differentiation is preserved. No mass lesion is noted on this unenhanced scan. CT CERVICAL SPINE FINDINGS Axial images of the cervical spine shows no acute fracture or subluxation. Computer processed images shows no acute fracture or subluxation. There are degenerative changes C1-C2 articulation. Mild disc space flattening with mild anterior spurring at C4-C5 level. There is moderate disc space flattening with mild anterior and moderate posterior  spurring at C5-C6 level. Moderate anterior spurring noted lower  endplate of C7. Mild anterior spurring lower endplate of T1 vertebral body. There is mild to moderate spinal canal stenosis due to posterior spurring at C5-C6 level. No prevertebral soft tissue swelling. Cervical airway is patent. Please note there is thickening of esophageal wall. Findings are highly suspicious for gastroesophageal reflux. Further correlation with endoscopy is recommended. No pneumothorax in visualized lung apices. IMPRESSION: 1. No acute intracranial abnormality. There is mild mucosal thickening with air-fluid level posterior aspect of the left maxillary sinus. No definite acute cortical infarction. 2. No cervical spine acute fracture or subluxation. 3. Multilevel degenerative changes as described above. There is moderate spinal canal stenosis due to posterior spurring at C5-C6 level. 4. There is thickening of esophageal wall. Findings highly suspicious for reflux esophagitis. Further correlation with endoscopy is recommended. Electronically Signed   By: Natasha Mead M.D.   On: 06/01/2016 09:27   Ct Cervical Spine Wo Contrast  Result Date: 06/01/2016 CLINICAL DATA:  Larey Seat off bed today, hit forehead EXAM: CT HEAD WITHOUT CONTRAST CT CERVICAL SPINE WITHOUT CONTRAST TECHNIQUE: Multidetector CT imaging of the head and cervical spine was performed following the standard protocol without intravenous contrast. Multiplanar CT image reconstructions of the cervical spine were also generated. COMPARISON:  04/17/2016 FINDINGS: CT HEAD FINDINGS No skull fracture is noted. Mild mucosal thickening with air-fluid level posterior aspect of the left maxillary sinus. The mastoid air cells are unremarkable. No acute cortical infarction. No hydrocephalus. The gray and white-matter differentiation is preserved. No mass lesion is noted on this unenhanced scan. CT CERVICAL SPINE FINDINGS Axial images of the cervical spine shows no acute fracture or subluxation. Computer processed images shows no acute fracture or  subluxation. There are degenerative changes C1-C2 articulation. Mild disc space flattening with mild anterior spurring at C4-C5 level. There is moderate disc space flattening with mild anterior and moderate posterior spurring at C5-C6 level. Moderate anterior spurring noted lower endplate of C7. Mild anterior spurring lower endplate of T1 vertebral body. There is mild to moderate spinal canal stenosis due to posterior spurring at C5-C6 level. No prevertebral soft tissue swelling. Cervical airway is patent. Please note there is thickening of esophageal wall. Findings are highly suspicious for gastroesophageal reflux. Further correlation with endoscopy is recommended. No pneumothorax in visualized lung apices. IMPRESSION: 1. No acute intracranial abnormality. There is mild mucosal thickening with air-fluid level posterior aspect of the left maxillary sinus. No definite acute cortical infarction. 2. No cervical spine acute fracture or subluxation. 3. Multilevel degenerative changes as described above. There is moderate spinal canal stenosis due to posterior spurring at C5-C6 level. 4. There is thickening of esophageal wall. Findings highly suspicious for reflux esophagitis. Further correlation with endoscopy is recommended. Electronically Signed   By: Natasha Mead M.D.   On: 06/01/2016 09:27   Dg Shoulder Left  Result Date: 06/01/2016 CLINICAL DATA:  Pain following fall EXAM: LEFT SHOULDER - 2+ VIEW COMPARISON:  December 23, 2015 FINDINGS: Frontal and Y scapular images were obtained. There is no appreciable fracture or dislocation. There is moderate generalized osteoarthritic change. No erosive change. Visualized left lung is clear. IMPRESSION: Moderate osteoarthritic change.  No acute fracture or dislocation. Electronically Signed   By: Bretta Bang III M.D.   On: 06/01/2016 09:19   Dg Hip Unilat W Or Wo Pelvis 2-3 Views Left  Result Date: 06/01/2016 CLINICAL DATA:  Pain following fall EXAM: DG HIP (WITH OR  WITHOUT PELVIS) 2-3V LEFT COMPARISON:  None. FINDINGS: Frontal pelvis as well as frontal and lateral left hip images were obtained. There is no apparent fracture or dislocation. There is slight symmetric narrowing of both hip joints. No erosive change. There are multiple foci of vascular calcification in the pelvis. There is postoperative change in the visualize lower lumbar region. IMPRESSION: Mild symmetric narrowing both hip joints. No fracture or dislocation. Areas of atherosclerosis in the pelvic arterial vascular regions. Electronically Signed   By: Bretta Bang III M.D.   On: 06/01/2016 09:18    Assessment/Plan  Essential hypertension  Patient BP has been systolic from 95-100. Will Stop her Amlodipine. Check vitals more often.  Dizziness  And falls related to multiple drugs .D/W patient again will try to decrease both Neurontin and diazepam.  Leucocytosis Patient had extensive W/U for infection and everything is negative. Will follow clinically. Repeat CBC in few weeks.      Family/ staff Communication:   Labs/tests ordered:

## 2016-06-19 ENCOUNTER — Encounter: Payer: Self-pay | Admitting: Internal Medicine

## 2016-06-20 ENCOUNTER — Non-Acute Institutional Stay (SKILLED_NURSING_FACILITY): Payer: Medicare Other | Admitting: Internal Medicine

## 2016-06-20 DIAGNOSIS — M4310 Spondylolisthesis, site unspecified: Secondary | ICD-10-CM | POA: Diagnosis not present

## 2016-06-20 DIAGNOSIS — F329 Major depressive disorder, single episode, unspecified: Secondary | ICD-10-CM | POA: Diagnosis not present

## 2016-06-20 DIAGNOSIS — M4806 Spinal stenosis, lumbar region: Secondary | ICD-10-CM

## 2016-06-20 DIAGNOSIS — I10 Essential (primary) hypertension: Secondary | ICD-10-CM | POA: Diagnosis not present

## 2016-06-20 DIAGNOSIS — J449 Chronic obstructive pulmonary disease, unspecified: Secondary | ICD-10-CM

## 2016-06-20 DIAGNOSIS — M48062 Spinal stenosis, lumbar region with neurogenic claudication: Secondary | ICD-10-CM

## 2016-06-20 DIAGNOSIS — F32A Depression, unspecified: Secondary | ICD-10-CM

## 2016-06-20 NOTE — Progress Notes (Signed)
This is a discharge note.  Level care skilled.  Facility [facility].  Chief complaint discharge note.   History of present illness.  Patient is a pleasant 65 year old female was here for rehabilitation after undergoing an L4-L5 decompression and fusion for symptomatic lumbar stenosis.  She has done quite well with therapy he continues to wear back brace she will need continued PT and OT as well as home health support at home.  She will be living apparently with her daughter.  In regards to pain she does receive Percocet as well as a muscle relaxer when necessary.  She's had a couple recent acute issues including routine blood work that showed a white count of over 16,000-she did go to the ER but workup was negative for any infection she continues to deny any fever or chills increased cough congestion or dysuria appears to be doing well in this regards this will need follow-up by her primary care provider.  She also recently had a fall and went to the ER where again workup was negative for any acute fracture or process  At times her blood pressure well or lung a bit low although manual readings appear to be fairly satisfactory-she does have some dizziness which is somewhat chronic as well-Dr. Chales Abrahams did take her off her Norvasc and we a decrease her Neurontin from 3 times a day down to 2 times a day also discontinue the Norvasc and she appears to be tolerating this well.  Recent blood pressures 126/50-134/94 I also see 158/82. 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  Currently she has no acute complaints is looking forward again to going home  Past Medical History:  Diagnosis Date  . Anxiety   . Arthritis   . Bronchitis   . Chronic back pain   . Chronic knee pain   . COPD (chronic  obstructive pulmonary disease) (HCC)   . 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 Dizziness   . Frequent falls   . GERD (gastroesophageal reflux disease)   . Hypertension   . Memory difficulty 2015  . Neuropathy (HCC)   . Pain management   . Prediabetes   . 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 Shortness of breath dyspnea    with exertion  . 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 Weakness of both legs         Past Surgical History:  Procedure Laterality Date  . APPENDECTOMY  1971  . BACK SURGERY     Lumbar Fusion  . CHOLECYSTECTOMY N/A 03/12/2016   Procedure: LAPAROSCOPIC CHOLECYSTECTOMY;  Surgeon: Ancil Linsey, MD;  Location: [facility];  Service: General;  Laterality: N/A;  . KNEE CARTILAGE SURGERY Right 2007  . TONSILLECTOMY  1971         Allergies  Allergen Reactions  . Bee Venom Anaphylaxis  . Coconut Flavor Anaphylaxis    Per patient  . Mushroom Extract Complex Anaphylaxis    Extreme sweating, vomiting  . Amitriptyline Other (See Comments)    Has nightmares when using, not in right state of mind   . Toradol [Ketorolac Tromethamine] Nausea And Vomiting  . Tramadol Nausea And Vomiting  . Trazodone And Nefazodone Nausea And Vomiting          Medication List  albuterol 108 (90 Base) MCG/ACT inhaler Commonly known as:  PROVENTIL HFA;VENTOLIN HFA Inhale 2 puffs into the lungs every 6 (six) hours as needed for wheezing.   Marland Kitchen  aspirin EC 81 MG tablet Take 81 mg by mouth daily.  BIOFREEZE 4 % Gel Generic drug:  Menthol (Topical Analgesic) Apply 1 application topically daily as needed (for pain-knees and back). Apply to  Patients POC PRN  cyclobenzaprine 10 MG tablet Commonly known as:  FLEXERIL Take 10 mg by mouth 3 (three) times daily.  diazepam 5 MG tablet Commonly known as:  VALIUM Take 1 tablet (5 mg total)  by mouth 3 (three) times daily as needed for anxiety.  dimenhyDRINATE 50 MG tablet Commonly known as:  DRAMAMINE Take 50 mg by mouth every 8 (eight) hours as needed for dizziness.  DULoxetine 60 MG capsule Commonly known as:  CYMBALTA Take 60 mg by mouth daily.  EVZIO 0.4 MG/0.4ML Soaj Generic drug:  Naloxone HCl Inject 0.4 mLs into the muscle once as needed (for Anaphylaxis/allergic reaction).  gabapentin 300 MG capsule Commonly known as:  NEURONTIN Take 300 mg by mouth 2  times daily.  hydrOXYzine 25 MG capsule Commonly known as:  VISTARIL Take 25 mg by mouth at bedtime.  metoprolol succinate 50 MG 24 hr tablet Commonly known as:  TOPROL-XL Take 50 mg by mouth at bedtime.  MILK OF MAGNESIA PO Give 30 ml by mouth as needed for constipation  omeprazole 20 MG capsule Commonly known as:  PRILOSEC Take 1 capsule (20 mg total) by mouth daily.  ondansetron 8 MG disintegrating tablet Commonly known as:  ZOFRAN ODT Take 1 tablet (8 mg total) by mouth every 8 (eight) hours as needed for nausea or vomiting.  oxyCODONE-acetaminophen 10-325 MG tablet Commonly known as:  PERCOCET Take 1 tablet by mouth every 4 (four) hours as needed for pain. Max APAP 3gm/24 hrs from all sources  SENOKOT S 8.6-50 MG tablet Generic drug:  senna-docusate Take 1 tablet by mouth 2 (two) times daily.  Vitamin D 2000 units Caps Take 2,000 Units by mouth daily.  VOLTAREN 1 % Gel Generic drug:  diclofenac sodium Apply 4 g topically every 8 (eight) hours.      Review of Systems   . Review of Systems   In generali no complaints of fever or chills.  Skin is not complaining of rashes or itching currently surgical site low back   Head ears eyes nose mouth and throat does not complaining of any visual changes or sore throat.  Resp---no complaints of cough or shortness of breath she does have a history COPD. has when necessary nebulizers  Cardiac does not complaining of chest pain  GI is not  complaining of abdominal discomfort nausea vomiting diarrhea constipation.  Muscle skeletal has an extensive history here of discomfort but is not complaining of pain currently doing well with therapy her previous discussion with her therapist  Neurologic is not complaining currently of dizziness or headache does have some history of somewhat chronic dizziness --recent medication changes were made including reducing the frequency of her Neurontin and discontinuing the Norvasc  Psych is not complaining of overt depression or anxiety-but does apparently have a history of anxiety she is on diazepam when necessary.    Temperature 98.1 pulse 61 respirations 20 blood pressure 120/50  Physical Exam In general this is a pleasant late middle-aged female in no distress sitting comfortably in her chair  Her skin is warm and dry she does a well-healed surgical  scar midline low back. I do not appreciate any tenderness or sign of infection  Eyes pupils appear reactive light sclerae and conjunctivae clear visual acuity appears grossly intact.  Oropharynx clear mucous membranes moist.  Chest is clear to auscultation there is no labored breathing.  Heart is regular rate and rhythm without murmur gallop or rub She has trace lower extremity edema bilaterally.  Abdomen is somewhat obese soft nontender there are positive bowel sounds.  Musculoskeletal is able to move all extremities 4  Is able to stand without assistance-and ambulate she is doing well with therapy.  strength appears to be intact also 4 extremities.  Neurologic is grossly intact her speech is clear no lateralizing findings  Psych-she is grossly oriented and conversant apparently does have some confusion at times which is not new- At times takes a while to answer questions                N     Labs reviewed:  Recent Labs (within last 365 days)   Recent Labs  06/29/15 1441  05/02/16 0946  06/11/16 0730 06/12/16 0122  NA  --   < > 137 136 134*  K  --   < > 4.0 4.7 3.9  CL  --   < > 103 105 97*  CO2  --   < > 28 25 28   GLUCOSE  --   < > 98 131* 115*  BUN  --   < > 13 13 13   CREATININE  --   < > 0.74 0.70 0.59  CALCIUM  --   < > 9.4 8.7* 9.1  MG 1.8  --   --   --   --   PHOS 2.8  --   --   --   --   < > = values in this interval not displayed.    Recent Labs (within last 365 days)   Recent Labs  03/31/16 1419 04/12/16 2142 04/17/16 1302  AST 26 25 23   ALT 18 20 19   ALKPHOS 94 98 91  BILITOT 0.9 0.8 0.7  PROT 7.7 7.4 7.1  ALBUMIN 4.0 4.1 3.9      Recent Labs (within last 365 days)   Recent Labs  05/02/16 0946 06/11/16 1430 06/12/16 0122  WBC 9.7 16.2* 15.0*  NEUTROABS 4.4 8.5* 6.5  HGB 14.8 13.9 13.5  HCT 46.5* 43.1 41.9  MCV 93.4 93.7 92.1  PLT 274 286 276     Recent Labs       Lab Results  Component Value Date   TSH 3.083 03/10/2016     Recent Labs       Lab Results  Component Value Date   HGBA1C 6.5 (H) 03/07/2016     Recent Labs  No results found for: CHOL, HDL, LDLCALC, LDLDIRECT, TRIG, CHOLHDL    Significant Diagnostic Results in last 30 days:   Imaging Results  Dg Ribs Unilateral W/chest Left  Result Date: 06/01/2016 CLINICAL DATA:  Pain following fall EXAM: LEFT RIBS AND CHEST - 3+ VIEW COMPARISON:  Chest radiograph Mar 10, 2016 FINDINGS: Frontal chest as well as oblique and cone-down lower rib images were obtained. Lungs are clear. Heart size and pulmonary vascularity are normal. No adenopathy. No demonstrable pneumothorax or pleural effusion. There is no appreciable rib fracture. IMPRESSION: No appreciable rib fracture. No edema or consolidation. No pneumothorax evident. Electronically Signed   By: Bretta Bang III M.D.   On: 06/01/2016 09:21   Ct Head Wo  Contrast  Result Date: 06/01/2016 CLINICAL DATA:  Larey SeatFell off bed today, hit forehead EXAM: CT HEAD WITHOUT CONTRAST CT CERVICAL SPINE WITHOUT CONTRAST  TECHNIQUE: Multidetector CT imaging of the head and cervical spine was performed following the standard protocol without intravenous contrast. Multiplanar CT image reconstructions of the cervical spine were also generated. COMPARISON:  04/17/2016 FINDINGS: CT HEAD FINDINGS No skull fracture is noted. Mild mucosal thickening with air-fluid level posterior aspect of the left maxillary sinus. The mastoid air cells are unremarkable. No acute cortical infarction. No hydrocephalus. The gray and white-matter differentiation is preserved. No mass lesion is noted on this unenhanced scan. CT CERVICAL SPINE FINDINGS Axial images of the cervical spine shows no acute fracture or subluxation. Computer processed images shows no acute fracture or subluxation. There are degenerative changes C1-C2 articulation. Mild disc space flattening with mild anterior spurring at C4-C5 level. There is moderate disc space flattening with mild anterior and moderate posterior spurring at C5-C6 level. Moderate anterior spurring noted lower endplate of C7. Mild anterior spurring lower endplate of T1 vertebral body. There is mild to moderate spinal canal stenosis due to posterior spurring at C5-C6 level. No prevertebral soft tissue swelling. Cervical airway is patent. Please note there is thickening of esophageal wall. Findings are highly suspicious for gastroesophageal reflux. Further correlation with endoscopy is recommended. No pneumothorax in visualized lung apices. IMPRESSION: 1. No acute intracranial abnormality. There is mild mucosal thickening with air-fluid level posterior aspect of the left maxillary sinus. No definite acute cortical infarction. 2. No cervical spine acute fracture or subluxation. 3. Multilevel degenerative changes as described above. There is moderate spinal canal stenosis due to posterior spurring at C5-C6 level. 4. There is thickening of esophageal wall. Findings highly suspicious for reflux esophagitis. Further  correlation with endoscopy is recommended. Electronically Signed   By: Natasha MeadLiviu  Pop M.D.   On: 06/01/2016 09:27   Ct Cervical Spine Wo Contrast  Result Date: 06/01/2016 CLINICAL DATA:  Larey SeatFell off bed today, hit forehead EXAM: CT HEAD WITHOUT CONTRAST CT CERVICAL SPINE WITHOUT CONTRAST TECHNIQUE: Multidetector CT imaging of the head and cervical spine was performed following the standard protocol without intravenous contrast. Multiplanar CT image reconstructions of the cervical spine were also generated. COMPARISON:  04/17/2016 FINDINGS: CT HEAD FINDINGS No skull fracture is noted. Mild mucosal thickening with air-fluid level posterior aspect of the left maxillary sinus. The mastoid air cells are unremarkable. No acute cortical infarction. No hydrocephalus. The gray and white-matter differentiation is preserved. No mass lesion is noted on this unenhanced scan. CT CERVICAL SPINE FINDINGS Axial images of the cervical spine shows no acute fracture or subluxation. Computer processed images shows no acute fracture or subluxation. There are degenerative changes C1-C2 articulation. Mild disc space flattening with mild anterior spurring at C4-C5 level. There is moderate disc space flattening with mild anterior and moderate posterior spurring at C5-C6 level. Moderate anterior spurring noted lower endplate of C7. Mild anterior spurring lower endplate of T1 vertebral body. There is mild to moderate spinal canal stenosis due to posterior spurring at C5-C6 level. No prevertebral soft tissue swelling. Cervical airway is patent. Please note there is thickening of esophageal wall. Findings are highly suspicious for gastroesophageal reflux. Further correlation with endoscopy is recommended. No pneumothorax in visualized lung apices. IMPRESSION: 1. No acute intracranial abnormality. There is mild mucosal thickening with air-fluid level posterior aspect of the left maxillary sinus. No definite acute cortical infarction. 2. No  cervical spine acute fracture or subluxation. 3. Multilevel degenerative changes  as described above. There is moderate spinal canal stenosis due to posterior spurring at C5-C6 level. 4. There is thickening of esophageal wall. Findings highly suspicious for reflux esophagitis. Further correlation with endoscopy is recommended. Electronically Signed   By: Natasha Mead M.D.   On: 06/01/2016 09:27   Dg Shoulder Left  Result Date: 06/01/2016 CLINICAL DATA:  Pain following fall EXAM: LEFT SHOULDER - 2+ VIEW COMPARISON:  December 23, 2015 FINDINGS: Frontal and Y scapular images were obtained. There is no appreciable fracture or dislocation. There is moderate generalized osteoarthritic change. No erosive change. Visualized left lung is clear. IMPRESSION: Moderate osteoarthritic change.  No acute fracture or dislocation. Electronically Signed   By: Bretta Bang III M.D.   On: 06/01/2016 09:19   Dg Hip Unilat W Or Wo Pelvis 2-3 Views Left  Result Date: 06/01/2016 CLINICAL DATA:  Pain following fall EXAM: DG HIP (WITH OR WITHOUT PELVIS) 2-3V LEFT COMPARISON:  None. FINDINGS: Frontal pelvis as well as frontal and lateral left hip images were obtained. There is no apparent fracture or dislocation. There is slight symmetric narrowing of both hip joints. No erosive change. There are multiple foci of vascular calcification in the pelvis. There is postoperative change in the visualize lower lumbar region. IMPRESSION: Mild symmetric narrowing both hip joints. No fracture or dislocation. Areas of atherosclerosis in the pelvic arterial vascular regions. Electronically Signed   By: Bretta Bang III M.D.   On: 06/01/2016 09:18     Assessment and plan. #1 history of lumbar stenosis status post L4-L5 decompression and fusion. Doing well with physical therapy pain appeared to be relatively well-controlled on Percocet every 4 hours when necessary as well as Neurontin 300 mg 2times a day and Cymbalta 60 mg a day she  also is receiving a muscle relaxer Flexeril.--She will need continued PT and OT at home as well as home health support for multiple medical issues  #2-history hypertension-this appears stable She has somewhat variable blood pressure readings but we are trying to avoid hypotension-her Norvasc has been discontinued she continues on Lopressor.   is not reporting dizziness currently although apparently this has been somewhat chronic   #3 depression this appears stable on Cymbalta at times she says she feel a little but appears to be doing better in this regards generally upbeat continues to be pleasant.  #4 listed history of hypokalemia she is not on potassium supplementation potassium has been stable most recently 3.9 on lab done 06/12/2016  #5 history of COPD she is on nebulizers as needed this is not really been an issue during her stay here.  #6 history of anxiety she is on diazepam 5 mg 3 times a day when necessary we have been trying to minimize the use here secondary to fall risk and dizziness at this point will defer to primary care provider since she is about to go home   .  #7 history of GERD she continues on Prilosec this has not been an issue during her stay.  #9 history of question pseudodementia-depression-again she is on Cymbalta this appears to be stable apparently has increased confusion at times-but has been pleasant and conversant when I have visited her.  #10 leukocytosis again no clear etiology was found per ER workup-she continues to be afebrile does not complain of chest congestion cough fever chills or dysuria follow-up with primary care provider as warranted     Again patient will be going home with her daughter will need continued PT and OT  for further strengthening with her history of recent surgery as well as home health support for multiple medical issues-.  [id number] note greater than 30 minutes spent on this discharge summary-greater than 50% of  time spent coordinating a plan of care for numerous diagnoses.

## 2016-06-26 ENCOUNTER — Encounter (HOSPITAL_COMMUNITY)
Admission: RE | Admit: 2016-06-26 | Discharge: 2016-06-26 | Disposition: A | Discharge status: Home / Self Care | Payer: Medicare Other | Source: Skilled Nursing Facility | Attending: Internal Medicine | Admitting: Internal Medicine

## 2016-06-26 DIAGNOSIS — Z48811 Encounter for surgical aftercare following surgery on the nervous system: Secondary | ICD-10-CM | POA: Insufficient documentation

## 2016-06-26 DIAGNOSIS — R1013 Epigastric pain: Secondary | ICD-10-CM | POA: Insufficient documentation

## 2016-06-26 DIAGNOSIS — R293 Abnormal posture: Secondary | ICD-10-CM | POA: Insufficient documentation

## 2016-06-26 DIAGNOSIS — D72829 Elevated white blood cell count, unspecified: Secondary | ICD-10-CM | POA: Insufficient documentation

## 2016-06-26 DIAGNOSIS — R262 Difficulty in walking, not elsewhere classified: Secondary | ICD-10-CM | POA: Insufficient documentation

## 2016-06-27 ENCOUNTER — Emergency Department (HOSPITAL_COMMUNITY): Payer: Medicare Other

## 2016-06-27 ENCOUNTER — Encounter (HOSPITAL_COMMUNITY): Payer: Self-pay | Admitting: Cardiology

## 2016-06-27 ENCOUNTER — Emergency Department (HOSPITAL_COMMUNITY)
Admission: EM | Admit: 2016-06-27 | Discharge: 2016-06-27 | Disposition: A | Discharge status: Home / Self Care | Payer: Medicare Other | Attending: Emergency Medicine | Admitting: Emergency Medicine

## 2016-06-27 DIAGNOSIS — R079 Chest pain, unspecified: Secondary | ICD-10-CM

## 2016-06-27 DIAGNOSIS — I10 Essential (primary) hypertension: Secondary | ICD-10-CM | POA: Diagnosis not present

## 2016-06-27 DIAGNOSIS — Z79899 Other long term (current) drug therapy: Secondary | ICD-10-CM | POA: Diagnosis not present

## 2016-06-27 DIAGNOSIS — J449 Chronic obstructive pulmonary disease, unspecified: Secondary | ICD-10-CM | POA: Diagnosis not present

## 2016-06-27 DIAGNOSIS — Z87891 Personal history of nicotine dependence: Secondary | ICD-10-CM | POA: Diagnosis not present

## 2016-06-27 DIAGNOSIS — Z7982 Long term (current) use of aspirin: Secondary | ICD-10-CM | POA: Insufficient documentation

## 2016-06-27 LAB — CBC
HCT: 39.7 % (ref 36.0–46.0)
HEMOGLOBIN: 12.7 g/dL (ref 12.0–15.0)
MCH: 29.9 pg (ref 26.0–34.0)
MCHC: 32 g/dL (ref 30.0–36.0)
MCV: 93.4 fL (ref 78.0–100.0)
PLATELETS: 285 10*3/uL (ref 150–400)
RBC: 4.25 MIL/uL (ref 3.87–5.11)
RDW: 13.8 % (ref 11.5–15.5)
WBC: 12.3 10*3/uL — ABNORMAL HIGH (ref 4.0–10.5)

## 2016-06-27 LAB — BASIC METABOLIC PANEL
ANION GAP: 9 (ref 5–15)
BUN: 13 mg/dL (ref 6–20)
CALCIUM: 8.9 mg/dL (ref 8.9–10.3)
CO2: 30 mmol/L (ref 22–32)
CREATININE: 0.76 mg/dL (ref 0.44–1.00)
Chloride: 100 mmol/L — ABNORMAL LOW (ref 101–111)
GFR calc Af Amer: >60 mL/min (ref >60)
GLUCOSE: 142 mg/dL — AB (ref 65–99)
Potassium: 3.7 mmol/L (ref 3.5–5.1)
Sodium: 139 mmol/L (ref 135–145)

## 2016-06-27 LAB — HEPATIC FUNCTION PANEL
ALT: 26 U/L (ref 14–54)
AST: 33 U/L (ref 15–41)
Albumin: 3.4 g/dL — ABNORMAL LOW (ref 3.5–5.0)
Alkaline Phosphatase: 121 U/L (ref 38–126)
TOTAL PROTEIN: 6.7 g/dL (ref 6.5–8.1)
Total Bilirubin: 0.3 mg/dL (ref 0.3–1.2)

## 2016-06-27 LAB — TROPONIN I

## 2016-06-27 MED ORDER — HYDROCODONE-ACETAMINOPHEN 5-325 MG PO TABS
1.0000 | ORAL_TABLET | Freq: Once | ORAL | Status: AC
  Administered 2016-06-27: 1 via ORAL
  Filled 2016-06-27: qty 1

## 2016-06-27 MED ORDER — MORPHINE SULFATE (PF) 4 MG/ML IV SOLN
4.0000 mg | Freq: Once | INTRAVENOUS | Status: AC
  Administered 2016-06-27: 4 mg via INTRAVENOUS
  Filled 2016-06-27: qty 1

## 2016-06-27 NOTE — ED Notes (Signed)
Patient given discharge instruction, verbalized understand. IV removed, band aid applied. Patient wheelchair out of the department.  

## 2016-06-27 NOTE — ED Provider Notes (Signed)
[facility] Provider Note   CSN: [id number] Arrival date & time: 06/27/16  1801     History   Chief Complaint Chief Complaint  Patient presents with  . Chest Pain    HPI Carla [name] is a 65 y.o. female.  Patient reports she had chest pain that was transient at 8 AM this morning.  She had no chest pain throughout the day.  Her chest pain then began again 1 hour ago.  His reported as a sharp pain in her anterior chest without radiation.  She was given aspirin by EMS and brought to the ER for evaluation.  Patient ports no shortness of breath.  Denies unilateral leg swelling.  No history DVT or pulmonary embolism.  She reports no shortness of breath at this time.  She reports anterior chest pain that is sharp and not worse with movement.  Some of his worse with palpation.  There is no pleuritic component.       Past Medical History:  Diagnosis Date  . Anxiety   . Arthritis   . Bronchitis   . Chronic back pain   . Chronic knee pain   . COPD (chronic obstructive pulmonary disease) (HCC)   . 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 Dizziness   . Frequent falls   . GERD (gastroesophageal reflux disease)   . Hypertension   . Memory difficulty 2015  . Neuropathy (HCC)   . Pain management   . Prediabetes   . 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 Shortness of breath dyspnea    with exertion  . 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 Weakness of both legs     Patient Active Problem List   Diagnosis Date Noted  . Contusion, multiple sites 06/01/2016  . Degenerative spondylolisthesis 05/09/2016  . Intractable nausea and vomiting 03/10/2016  . Abdominal pain 03/10/2016  . Abnormal thyroid function test 03/08/2016  . Chest pain, unspecified 03/07/2016  . Epigastric abdominal pain 03/07/2016  . Cholecystitis, acute 03/07/2016  . Chronic pain syndrome 03/07/2016  .  Prediabetes 03/07/2016  . Obesity, morbid (HCC) 03/07/2016  . Elevated troponin 12/21/2015  . Fall 12/21/2015  . 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 Erythrocytosis   . Essential hypertension 06/29/2015  . 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 Anxiety attack 03/08/2012  . COPD (chronic obstructive pulmonary disease) (HCC) 03/05/2012    Past Surgical History:  Procedure Laterality Date  . APPENDECTOMY  1971  . BACK SURGERY     Lumbar Fusion  . CHOLECYSTECTOMY N/A 03/12/2016   Procedure: LAPAROSCOPIC CHOLECYSTECTOMY;  Surgeon: Ancil Linsey, MD;  Location: [facility];  Service: General;  Laterality: N/A;  . KNEE CARTILAGE SURGERY Right 2007  . TONSILLECTOMY  1971    OB History    Gravida Para Term Preterm AB Living   2 2 2          SAB TAB Ectopic Multiple Live Births                   Home Medications    Prior to Admission medications   Medication Sig Start Date End Date Taking? Authorizing Provider  albuterol (PROVENTIL HFA;VENTOLIN HFA) 108 (90 BASE) MCG/ACT inhaler Inhale 2 puffs into the lungs every 6 (six) hours as needed for wheezing. 06/21/13  Yes Nimish Normajean Glasgow, MD  amLODipine (NORVASC) 2.5 MG tablet Take 2 tablets (5 mg total) by mouth daily.  06/30/15  Yes Marjan Rabbani, MD  aspirin EC 81 MG tablet Take 81 mg by mouth daily.   Yes Historical Provider, MD  Cholecalciferol (VITAMIN D) 2000 units CAPS Take 2,000 Units by mouth daily.    Yes Historical Provider, MD  cyclobenzaprine (FLEXERIL) 10 MG tablet Take 10 mg by mouth 3 (three) times daily.    Yes Historical Provider, MD  diazepam (VALIUM) 5 MG tablet Take 1 tablet (5 mg total) by mouth 3 (three) times daily as needed for anxiety. 05/13/16  Yes Julio Sicks, MD  dimenhyDRINATE (DRAMAMINE) 50 MG tablet Take 50 mg by mouth every 8 (eight) hours as needed for dizziness.   Yes Historical Provider, MD  diphenhydrAMINE (BENADRYL) 25 MG tablet Take 25-50 mg by mouth daily as needed  for itching or allergies.   Yes Historical Provider, MD  DULoxetine (CYMBALTA) 60 MG capsule Take 60 mg by mouth daily.    Yes Historical Provider, MD  gabapentin (NEURONTIN) 100 MG capsule Take 200 mg by mouth 2 (two) times daily.    Yes Historical Provider, MD  hydrOXYzine (VISTARIL) 25 MG capsule Take 25 mg by mouth at bedtime.   Yes Historical Provider, MD  Magnesium Hydroxide (MILK OF MAGNESIA PO) Give 30 ml by mouth as needed for constipation   Yes Historical Provider, MD  metoprolol succinate (TOPROL-XL) 50 MG 24 hr tablet Take 50 mg by mouth at bedtime. 12/14/15  Yes Historical Provider, MD  omeprazole (PRILOSEC) 20 MG capsule Take 1 capsule (20 mg total) by mouth daily. 06/30/15  Yes Marjan Rabbani, MD  ondansetron (ZOFRAN ODT) 8 MG disintegrating tablet Take 1 tablet (8 mg total) by mouth every 8 (eight) hours as needed for nausea or vomiting. 03/13/16  Yes Erick Blinks, MD  oxyCODONE-acetaminophen (PERCOCET) 10-325 MG tablet Take 1 tablet by mouth every 4 (four) hours as needed for pain. Max APAP 3gm/24 hrs from all sources 06/05/16  Yes Tiffany L Reed, DO  EVZIO 0.4 MG/0.4ML SOAJ Inject 0.4 mLs into the muscle once as needed (for Anaphylaxis/allergic reaction).  02/19/15   Historical Provider, MD    Family History Family History  Problem Relation Age of Onset  . Heart failure Father   . Diabetes Father   . Kidney failure Father     Social History Social History  Substance Use Topics  . Smoking status: Former Smoker    Packs/day: 0.50    Years: 25.00    Types: Cigarettes    Start date: 08/05/1970  . Smokeless tobacco: Never Used     Comment: 04/30/15 less than 1 PPD  . Alcohol use No     Comment: Very rare     Allergies   Bee venom; Coconut flavor; Mushroom extract complex; Amitriptyline; Toradol [ketorolac tromethamine]; Tramadol; and Trazodone and nefazodone   Review of Systems Review of Systems  All other systems reviewed and are negative.    Physical Exam Updated  Vital Signs BP 121/55   Pulse (!) 48   Temp 98 F (36.7 C) (Oral)   Resp 19   Ht 5\' 4"  (1.626 m)   Wt 217 lb (98.4 kg)   SpO2 93%   BMI 37.25 kg/m   Physical Exam  Constitutional: She is oriented to person, place, and time. She appears well-developed and well-nourished. No distress.  HENT:  Head: Normocephalic and atraumatic.  Eyes: EOM are normal.  Neck: Normal range of motion.  Cardiovascular: Normal rate, regular rhythm and normal heart sounds.   Pulmonary/Chest: Effort normal and breath  sounds normal. She exhibits tenderness (Along the costal chondral border left greater than right).  Abdominal: Soft. She exhibits no distension. There is no tenderness.  Musculoskeletal: Normal range of motion.  Neurological: She is alert and oriented to person, place, and time.  Skin: Skin is warm and dry.  Psychiatric: She has a normal mood and affect. Judgment normal.  Nursing note and vitals reviewed.    ED Treatments / Results  Labs (all labs ordered are listed, but only abnormal results are displayed) Labs Reviewed  BASIC METABOLIC PANEL - Abnormal; Notable for the following:       Result Value   Chloride 100 (*)    Glucose, Bld 142 (*)    All other components within normal limits  CBC - Abnormal; Notable for the following:    WBC 12.3 (*)    All other components within normal limits  HEPATIC FUNCTION PANEL - Abnormal; Notable for the following:    Albumin 3.4 (*)    Bilirubin, Direct <0.1 (*)    All other components within normal limits  TROPONIN I  TROPONIN I    EKG  EKG Interpretation  Date/Time:  Friday June 27 2016 18:03:49 EDT Ventricular Rate:  60 PR Interval:    QRS Duration: 57 QT Interval:  688 QTC Calculation: 688 R Axis:   59 Text Interpretation:  Sinus rhythm Anteroseptal infarct, old Borderline T abnormalities, inferior leads Prolonged QT interval No significant change was found Confirmed by Sonal Dorwart  MD, Caryn Bee ([id number]) on 06/27/2016 6:22:39 PM         Radiology Dg Chest 2 View  Result Date: 06/27/2016 CLINICAL DATA:  Acute onset of intermittent generalized chest pain. Shortness of breath. Decreased O2 saturation. Initial encounter. EXAM: CHEST  2 VIEW COMPARISON:  Chest radiograph performed 06/01/2016 FINDINGS: The lungs are well-aerated. Vascular congestion is noted. Increased interstitial markings raise concern for mild pulmonary edema. There is no evidence of pleural effusion or pneumothorax. The heart is mildly enlarged. No acute osseous abnormalities are seen. IMPRESSION: Vascular congestion and mild cardiomegaly. Increased interstitial markings raise concern for mild pulmonary edema. Electronically Signed   By: Roanna Raider M.D.   On: 06/27/2016 18:52    Procedures Procedures (including critical care time)  Medications Ordered in ED Medications  morphine 4 MG/ML injection 4 mg (4 mg Intravenous Given 06/27/16 1823)  HYDROcodone-acetaminophen (NORCO/VICODIN) 5-325 MG per tablet 1 tablet (1 tablet Oral Given 06/27/16 2134)     Initial Impression / Assessment and Plan / ED Course  I have reviewed the triage vital signs and the nursing notes.  Pertinent labs & imaging results that were available during my care of the patient were reviewed by me and considered in my medical decision making (see chart for details).  Clinical Course    Overall the patient is well-appearing.  As reported by nursing staff that she was hypoxic but a my valuation her O2 sats never fell below 92%.  She reports no shortness of breath at this time.  Pain is resolving in the emergency department.  Doubt ACS.  Doubt PE.  No unilateral leg swelling.  Overall well-appearing.  She understands to return to the ER for new or worsening symptoms.  I suspect some of this is either gastroesophageal reflux disease or musculoskeletal pain.  Final Clinical Impressions(s) / ED Diagnoses   Final diagnoses:  Chest pain, unspecified chest pain type    New  Prescriptions Discharge Medication List as of 06/27/2016  9:27 PM  Azalia BilisKevin Elianys Conry, MD 06/27/16 2249

## 2016-06-27 NOTE — ED Notes (Signed)
Pt 88% on RA. 2L [place] applied

## 2016-06-27 NOTE — ED Triage Notes (Signed)
Chest pain that started at 8 am and pain went away.  Chest pain returned an hour ago.   Rates pain 9/10.  EMS gave 4 baby aspirin.

## 2016-07-04 ENCOUNTER — Encounter: Payer: Self-pay | Admitting: Internal Medicine

## 2016-07-04 NOTE — Progress Notes (Signed)
This encounter was created in error - please disregard.

## 2016-07-08 ENCOUNTER — Emergency Department (HOSPITAL_COMMUNITY)
Admission: EM | Admit: 2016-07-08 | Discharge: 2016-07-08 | Disposition: A | Discharge status: Home / Self Care | Payer: Medicare Other | Attending: Emergency Medicine | Admitting: Emergency Medicine

## 2016-07-08 ENCOUNTER — Emergency Department (HOSPITAL_COMMUNITY): Payer: Medicare Other

## 2016-07-08 ENCOUNTER — Encounter (HOSPITAL_COMMUNITY): Payer: Self-pay | Admitting: *Deleted

## 2016-07-08 DIAGNOSIS — F439 Reaction to severe stress, unspecified: Secondary | ICD-10-CM | POA: Diagnosis not present

## 2016-07-08 DIAGNOSIS — J449 Chronic obstructive pulmonary disease, unspecified: Secondary | ICD-10-CM | POA: Insufficient documentation

## 2016-07-08 DIAGNOSIS — I10 Essential (primary) hypertension: Secondary | ICD-10-CM | POA: Insufficient documentation

## 2016-07-08 DIAGNOSIS — R404 Transient alteration of awareness: Secondary | ICD-10-CM | POA: Insufficient documentation

## 2016-07-08 DIAGNOSIS — Z87891 Personal history of nicotine dependence: Secondary | ICD-10-CM | POA: Diagnosis not present

## 2016-07-08 DIAGNOSIS — R4182 Altered mental status, unspecified: Secondary | ICD-10-CM | POA: Diagnosis present

## 2016-07-08 DIAGNOSIS — F54 Psychological and behavioral factors associated with disorders or diseases classified elsewhere: Secondary | ICD-10-CM

## 2016-07-08 DIAGNOSIS — Z7982 Long term (current) use of aspirin: Secondary | ICD-10-CM | POA: Diagnosis not present

## 2016-07-08 DIAGNOSIS — Z79899 Other long term (current) drug therapy: Secondary | ICD-10-CM | POA: Insufficient documentation

## 2016-07-08 LAB — COMPREHENSIVE METABOLIC PANEL
ALK PHOS: 125 U/L (ref 38–126)
ALT: 18 U/L (ref 14–54)
ANION GAP: 12 (ref 5–15)
AST: 33 U/L (ref 15–41)
Albumin: 4.2 g/dL (ref 3.5–5.0)
BUN: 8 mg/dL (ref 6–20)
CALCIUM: 9.4 mg/dL (ref 8.9–10.3)
CO2: 25 mmol/L (ref 22–32)
Chloride: 102 mmol/L (ref 101–111)
Creatinine, Ser: 0.78 mg/dL (ref 0.44–1.00)
GFR calc non Af Amer: >60 mL/min (ref >60)
Glucose, Bld: 114 mg/dL — ABNORMAL HIGH (ref 65–99)
Potassium: 3.6 mmol/L (ref 3.5–5.1)
Sodium: 139 mmol/L (ref 135–145)
TOTAL PROTEIN: 8.2 g/dL — AB (ref 6.5–8.1)
Total Bilirubin: 0.8 mg/dL (ref 0.3–1.2)

## 2016-07-08 LAB — AMMONIA: AMMONIA: 19 umol/L (ref 9–35)

## 2016-07-08 LAB — URINALYSIS, ROUTINE W REFLEX MICROSCOPIC
Bilirubin Urine: NEGATIVE
Glucose, UA: NEGATIVE mg/dL
Ketones, ur: NEGATIVE mg/dL
LEUKOCYTES UA: NEGATIVE
NITRITE: NEGATIVE
SPECIFIC GRAVITY, URINE: 1.015 (ref 1.005–1.030)
pH: 6 (ref 5.0–8.0)

## 2016-07-08 LAB — CBC
HCT: 45.1 % (ref 36.0–46.0)
HEMOGLOBIN: 14.9 g/dL (ref 12.0–15.0)
MCH: 29.9 pg (ref 26.0–34.0)
MCHC: 33 g/dL (ref 30.0–36.0)
MCV: 90.6 fL (ref 78.0–100.0)
Platelets: 392 10*3/uL (ref 150–400)
RBC: 4.98 MIL/uL (ref 3.87–5.11)
RDW: 14.4 % (ref 11.5–15.5)
WBC: 9.8 10*3/uL (ref 4.0–10.5)

## 2016-07-08 LAB — URINE MICROSCOPIC-ADD ON

## 2016-07-08 MED ORDER — SODIUM CHLORIDE 0.9 % IV BOLUS (SEPSIS)
500.0000 mL | Freq: Once | INTRAVENOUS | Status: AC
  Administered 2016-07-08: 500 mL via INTRAVENOUS

## 2016-07-08 MED ORDER — HYDROCODONE-ACETAMINOPHEN 5-325 MG PO TABS
2.0000 | ORAL_TABLET | Freq: Once | ORAL | Status: AC
  Administered 2016-07-08: 2 via ORAL
  Filled 2016-07-08: qty 2
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
## 2016-07-08 NOTE — Discharge Instructions (Signed)
If you were given medicines take as directed.  If you are on coumadin or contraceptives realize their levels and effectiveness is altered by many different medicines.  If you have any reaction (rash, tongues swelling, other) to the medicines stop taking and see a physician.    If your blood pressure was elevated in the ER make sure you follow up for management with a primary doctor or return for chest pain, shortness of breath or stroke symptoms.  Please follow up as directed and return to the ER or see a physician for new or worsening symptoms.  Thank you. Vitals:   07/08/16 1110 07/08/16 1130 07/08/16 1230 07/08/16 1300  BP: 129/59 128/56 142/68 153/67  Pulse: 68 72 73 79  Resp: 16 17 16 22   Temp:      TempSrc:      SpO2: 98% 97% 100% 100%  Weight:      Height:
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
## 2016-07-08 NOTE — ED Notes (Signed)
Pt returned from CT at this time.  

## 2016-07-08 NOTE — ED Notes (Signed)
Pt to CT at this time.

## 2016-07-08 NOTE — ED Notes (Signed)
Pt is tearful during assessment, states she doesn't know why she is here today. Denies any pain, SOB, N/V/D/. When asked the month pt responds with "summer" multiple times, pt notified we are in fall and the month is September. Able to state name, dob, and that she is in a hospital.

## 2016-07-24 ENCOUNTER — Other Ambulatory Visit: Payer: Self-pay | Admitting: Internal Medicine

## 2016-07-25 ENCOUNTER — Other Ambulatory Visit: Payer: Self-pay | Admitting: Internal Medicine

## 2016-08-18 DIAGNOSIS — I219 Acute myocardial infarction, unspecified: Secondary | ICD-10-CM

## 2016-08-18 HISTORY — DX: Acute myocardial infarction, unspecified: I21.9

## 2016-08-19 ENCOUNTER — Encounter (HOSPITAL_COMMUNITY): Payer: Self-pay | Admitting: General Practice

## 2016-08-19 ENCOUNTER — Inpatient Hospital Stay (HOSPITAL_COMMUNITY)
Admission: AD | Admit: 2016-08-19 | Discharge: 2016-08-21 | DRG: 247 | Disposition: A | Discharge status: Home Health | Payer: Medicare Other | Source: Other Acute Inpatient Hospital | Attending: Cardiovascular Disease | Admitting: Cardiovascular Disease

## 2016-08-19 ENCOUNTER — Encounter (HOSPITAL_COMMUNITY)
Admission: AD | Disposition: A | Discharge status: Home Health | Payer: Self-pay | Source: Other Acute Inpatient Hospital | Attending: Cardiovascular Disease

## 2016-08-19 DIAGNOSIS — I10 Essential (primary) hypertension: Secondary | ICD-10-CM | POA: Diagnosis present

## 2016-08-19 DIAGNOSIS — R7303 Prediabetes: Secondary | ICD-10-CM | POA: Diagnosis present

## 2016-08-19 DIAGNOSIS — I214 Non-ST elevation (NSTEMI) myocardial infarction: Secondary | ICD-10-CM | POA: Diagnosis present

## 2016-08-19 DIAGNOSIS — Z7982 Long term (current) use of aspirin: Secondary | ICD-10-CM

## 2016-08-19 DIAGNOSIS — Z5329 Procedure and treatment not carried out because of patient's decision for other reasons: Secondary | ICD-10-CM | POA: Diagnosis not present

## 2016-08-19 DIAGNOSIS — Z6835 Body mass index (BMI) 35.0-35.9, adult: Secondary | ICD-10-CM

## 2016-08-19 DIAGNOSIS — M549 Dorsalgia, unspecified: Secondary | ICD-10-CM | POA: Diagnosis not present

## 2016-08-19 DIAGNOSIS — Z79899 Other long term (current) drug therapy: Secondary | ICD-10-CM

## 2016-08-19 DIAGNOSIS — K219 Gastro-esophageal reflux disease without esophagitis: Secondary | ICD-10-CM | POA: Diagnosis present

## 2016-08-19 DIAGNOSIS — I25110 Atherosclerotic heart disease of native coronary artery with unstable angina pectoris: Secondary | ICD-10-CM | POA: Diagnosis present

## 2016-08-19 DIAGNOSIS — Z981 Arthrodesis status: Secondary | ICD-10-CM

## 2016-08-19 DIAGNOSIS — M1990 Unspecified osteoarthritis, unspecified site: Secondary | ICD-10-CM | POA: Diagnosis not present

## 2016-08-19 DIAGNOSIS — G629 Polyneuropathy, unspecified: Secondary | ICD-10-CM | POA: Diagnosis present

## 2016-08-19 DIAGNOSIS — Z87891 Personal history of nicotine dependence: Secondary | ICD-10-CM | POA: Diagnosis not present

## 2016-08-19 DIAGNOSIS — F6811 Factitious disorder with predominantly psychological signs and symptoms: Secondary | ICD-10-CM | POA: Diagnosis present

## 2016-08-19 DIAGNOSIS — Z91018 Allergy to other foods: Secondary | ICD-10-CM

## 2016-08-19 DIAGNOSIS — R269 Unspecified abnormalities of gait and mobility: Secondary | ICD-10-CM | POA: Diagnosis present

## 2016-08-19 DIAGNOSIS — Z91030 Bee allergy status: Secondary | ICD-10-CM

## 2016-08-19 DIAGNOSIS — Z9181 History of falling: Secondary | ICD-10-CM

## 2016-08-19 DIAGNOSIS — M48062 Spinal stenosis, lumbar region with neurogenic claudication: Secondary | ICD-10-CM | POA: Diagnosis not present

## 2016-08-19 DIAGNOSIS — Z885 Allergy status to narcotic agent status: Secondary | ICD-10-CM

## 2016-08-19 DIAGNOSIS — F329 Major depressive disorder, single episode, unspecified: Secondary | ICD-10-CM | POA: Diagnosis not present

## 2016-08-19 DIAGNOSIS — J449 Chronic obstructive pulmonary disease, unspecified: Secondary | ICD-10-CM | POA: Diagnosis present

## 2016-08-19 DIAGNOSIS — Z79891 Long term (current) use of opiate analgesic: Secondary | ICD-10-CM

## 2016-08-19 DIAGNOSIS — Z8249 Family history of ischemic heart disease and other diseases of the circulatory system: Secondary | ICD-10-CM

## 2016-08-19 DIAGNOSIS — M25561 Pain in right knee: Secondary | ICD-10-CM | POA: Diagnosis present

## 2016-08-19 DIAGNOSIS — Z888 Allergy status to other drugs, medicaments and biological substances status: Secondary | ICD-10-CM

## 2016-08-19 DIAGNOSIS — I249 Acute ischemic heart disease, unspecified: Secondary | ICD-10-CM | POA: Diagnosis present

## 2016-08-19 DIAGNOSIS — F419 Anxiety disorder, unspecified: Secondary | ICD-10-CM | POA: Diagnosis present

## 2016-08-19 DIAGNOSIS — Z7901 Long term (current) use of anticoagulants: Secondary | ICD-10-CM

## 2016-08-19 DIAGNOSIS — G8929 Other chronic pain: Secondary | ICD-10-CM | POA: Diagnosis present

## 2016-08-19 DIAGNOSIS — Z955 Presence of coronary angioplasty implant and graft: Secondary | ICD-10-CM

## 2016-08-19 HISTORY — DX: Acute myocardial infarction, unspecified: I21.9

## 2016-08-19 HISTORY — PX: CARDIAC CATHETERIZATION: SHX172

## 2016-08-19 HISTORY — DX: Unspecified chronic bronchitis: J42

## 2016-08-19 HISTORY — DX: Angina pectoris, unspecified: I20.9

## 2016-08-19 HISTORY — DX: Prediabetes: R73.03

## 2016-08-19 HISTORY — DX: Migraine, unspecified, not intractable, without status migrainosus: G43.909

## 2016-08-19 HISTORY — DX: Pneumonia, unspecified organism: J18.9

## 2016-08-19 LAB — CBC WITH DIFFERENTIAL/PLATELET
BASOS ABS: 0 10*3/uL (ref 0.0–0.1)
Basophils Relative: 1 %
EOS PCT: 4 %
Eosinophils Absolute: 0.3 10*3/uL (ref 0.0–0.7)
HCT: 43.6 % (ref 36.0–46.0)
Hemoglobin: 13.7 g/dL (ref 12.0–15.0)
LYMPHS PCT: 60 %
Lymphs Abs: 5.1 10*3/uL — ABNORMAL HIGH (ref 0.7–4.0)
MCH: 28.7 pg (ref 26.0–34.0)
MCHC: 31.4 g/dL (ref 30.0–36.0)
MCV: 91.2 fL (ref 78.0–100.0)
MONO ABS: 0.7 10*3/uL (ref 0.1–1.0)
Monocytes Relative: 8 %
Neutro Abs: 2.5 10*3/uL (ref 1.7–7.7)
Neutrophils Relative %: 29 %
PLATELETS: 227 10*3/uL (ref 150–400)
RBC: 4.78 MIL/uL (ref 3.87–5.11)
RDW: 13.5 % (ref 11.5–15.5)
WBC: 8.6 10*3/uL (ref 4.0–10.5)

## 2016-08-19 LAB — LIPID PANEL
CHOLESTEROL: 173 mg/dL (ref 0–200)
HDL: 39 mg/dL — ABNORMAL LOW (ref >40)
LDL Cholesterol: 101 mg/dL — ABNORMAL HIGH (ref 0–99)
Total CHOL/HDL Ratio: 4.4 RATIO
Triglycerides: 163 mg/dL — ABNORMAL HIGH (ref <150)
VLDL: 33 mg/dL (ref 0–40)

## 2016-08-19 LAB — PROTIME-INR
INR: 1.07
Prothrombin Time: 13.9 seconds (ref 11.4–15.2)

## 2016-08-19 LAB — BASIC METABOLIC PANEL
ANION GAP: 7 (ref 5–15)
BUN: 7 mg/dL (ref 6–20)
CALCIUM: 9 mg/dL (ref 8.9–10.3)
CO2: 26 mmol/L (ref 22–32)
CREATININE: 0.49 mg/dL (ref 0.44–1.00)
Chloride: 109 mmol/L (ref 101–111)
GFR calc Af Amer: >60 mL/min (ref >60)
GLUCOSE: 92 mg/dL (ref 65–99)
Potassium: 3.8 mmol/L (ref 3.5–5.1)
Sodium: 142 mmol/L (ref 135–145)

## 2016-08-19 LAB — TROPONIN I
TROPONIN I: 0.11 ng/mL — AB (ref <0.03)
Troponin I: 0.12 ng/mL (ref <0.03)

## 2016-08-19 LAB — MRSA PCR SCREENING: MRSA BY PCR: NEGATIVE

## 2016-08-19 SURGERY — LEFT HEART CATH AND CORONARY ANGIOGRAPHY
Anesthesia: LOCAL

## 2016-08-19 MED ORDER — NITROGLYCERIN 0.4 MG SL SUBL: 0.4000 mg | SUBLINGUAL_TABLET | SUBLINGUAL | Status: DC | PRN

## 2016-08-19 MED ORDER — DIAZEPAM 5 MG PO TABS
5.0000 mg | ORAL_TABLET | Freq: Three times a day (TID) | ORAL | Status: DC | PRN
  Administered 2016-08-19 – 2016-08-20 (×3): 5 mg via ORAL
  Filled 2016-08-19 (×3): qty 1

## 2016-08-19 MED ORDER — IOPAMIDOL (ISOVUE-370) INJECTION 76%
INTRAVENOUS | Status: AC
  Filled 2016-08-19: qty 100

## 2016-08-19 MED ORDER — MAGNESIUM HYDROXIDE 400 MG/5ML PO SUSP: 30.0000 mL | Freq: Every evening | ORAL | Status: DC | PRN

## 2016-08-19 MED ORDER — IOPAMIDOL (ISOVUE-370) INJECTION 76%
INTRAVENOUS | Status: DC | PRN
  Administered 2016-08-19: 75 mL via INTRA_ARTERIAL

## 2016-08-19 MED ORDER — DULOXETINE HCL 60 MG PO CPEP
60.0000 mg | ORAL_CAPSULE | Freq: Every day | ORAL | Status: DC
  Administered 2016-08-20 – 2016-08-21 (×2): 60 mg via ORAL
  Filled 2016-08-19 (×2): qty 1

## 2016-08-19 MED ORDER — SODIUM CHLORIDE 0.9 % WEIGHT BASED INFUSION
3.0000 mL/kg/h | INTRAVENOUS | Status: AC
  Administered 2016-08-19: 19:00:00 3 mL/kg/h via INTRAVENOUS

## 2016-08-19 MED ORDER — HYDRALAZINE HCL 20 MG/ML IJ SOLN
INTRAMUSCULAR | Status: AC
  Filled 2016-08-19: qty 1

## 2016-08-19 MED ORDER — AMLODIPINE BESYLATE 5 MG PO TABS
5.0000 mg | ORAL_TABLET | Freq: Every day | ORAL | Status: DC
  Administered 2016-08-19 – 2016-08-21 (×3): 5 mg via ORAL
  Filled 2016-08-19 (×3): qty 1

## 2016-08-19 MED ORDER — PANTOPRAZOLE SODIUM 40 MG PO TBEC
40.0000 mg | DELAYED_RELEASE_TABLET | Freq: Every day | ORAL | Status: DC
  Administered 2016-08-20 – 2016-08-21 (×2): 40 mg via ORAL
  Filled 2016-08-19 (×2): qty 1

## 2016-08-19 MED ORDER — ASPIRIN EC 81 MG PO TBEC: 81.0000 mg | DELAYED_RELEASE_TABLET | Freq: Every day | ORAL | Status: DC

## 2016-08-19 MED ORDER — FENTANYL CITRATE (PF) 100 MCG/2ML IJ SOLN
INTRAMUSCULAR | Status: DC | PRN
  Administered 2016-08-19: 50 ug via INTRAVENOUS
  Administered 2016-08-19: 25 ug via INTRAVENOUS

## 2016-08-19 MED ORDER — METOPROLOL SUCCINATE ER 50 MG PO TB24
50.0000 mg | ORAL_TABLET | Freq: Every day | ORAL | Status: DC
  Administered 2016-08-19 – 2016-08-20 (×2): 50 mg via ORAL
  Filled 2016-08-19 (×2): qty 1

## 2016-08-19 MED ORDER — ALBUTEROL SULFATE (2.5 MG/3ML) 0.083% IN NEBU: 2.5000 mg | INHALATION_SOLUTION | Freq: Four times a day (QID) | RESPIRATORY_TRACT | Status: DC | PRN

## 2016-08-19 MED ORDER — HYDROMORPHONE HCL 1 MG/ML IJ SOLN
1.0000 mg | Freq: Once | INTRAMUSCULAR | Status: AC
  Administered 2016-08-19: 1 mg via INTRAVENOUS
  Filled 2016-08-19: qty 1

## 2016-08-19 MED ORDER — FENTANYL CITRATE (PF) 100 MCG/2ML IJ SOLN
INTRAMUSCULAR | Status: AC
  Filled 2016-08-19: qty 2

## 2016-08-19 MED ORDER — ONDANSETRON HCL 4 MG/2ML IJ SOLN: 4.0000 mg | Freq: Four times a day (QID) | INTRAMUSCULAR | Status: DC | PRN

## 2016-08-19 MED ORDER — HYDRALAZINE HCL 20 MG/ML IJ SOLN
INTRAMUSCULAR | Status: DC | PRN
  Administered 2016-08-19: 10 mg via INTRAVENOUS

## 2016-08-19 MED ORDER — LIDOCAINE HCL (PF) 1 % IJ SOLN
INTRAMUSCULAR | Status: AC
  Filled 2016-08-19: qty 30

## 2016-08-19 MED ORDER — ENSURE ENLIVE PO LIQD
237.0000 mL | Freq: Two times a day (BID) | ORAL | Status: DC
  Administered 2016-08-20: 11:00:00 237 mL via ORAL
  Filled 2016-08-19 (×8): qty 237

## 2016-08-19 MED ORDER — NITROGLYCERIN 1 MG/10 ML FOR IR/CATH LAB
INTRA_ARTERIAL | Status: DC | PRN
  Administered 2016-08-19 (×2): 200 ug via INTRACORONARY

## 2016-08-19 MED ORDER — SODIUM CHLORIDE 0.9 % IV SOLN: 250.0000 mL | INTRAVENOUS | Status: DC | PRN

## 2016-08-19 MED ORDER — ALBUTEROL SULFATE HFA 108 (90 BASE) MCG/ACT IN AERS: 2.0000 | INHALATION_SPRAY | Freq: Four times a day (QID) | RESPIRATORY_TRACT | Status: DC | PRN

## 2016-08-19 MED ORDER — TICAGRELOR 90 MG PO TABS
ORAL_TABLET | ORAL | Status: AC
  Filled 2016-08-19: qty 2

## 2016-08-19 MED ORDER — OXYCODONE HCL 5 MG PO TABS
5.0000 mg | ORAL_TABLET | ORAL | Status: DC | PRN
  Administered 2016-08-19 – 2016-08-20 (×3): 5 mg via ORAL
  Filled 2016-08-19 (×3): qty 1

## 2016-08-19 MED ORDER — SODIUM CHLORIDE 0.9% FLUSH: 3.0000 mL | Freq: Two times a day (BID) | INTRAVENOUS | Status: DC

## 2016-08-19 MED ORDER — HEPARIN (PORCINE) IN NACL 2-0.9 UNIT/ML-% IJ SOLN
INTRAMUSCULAR | Status: DC | PRN
  Administered 2016-08-19: 1000 mL via INTRA_ARTERIAL

## 2016-08-19 MED ORDER — BIVALIRUDIN BOLUS VIA INFUSION - CUPID
INTRAVENOUS | Status: DC | PRN
Start: 2016-08-19 — End: 2016-08-19
  Administered 2016-08-19: 70.425 mg via INTRAVENOUS

## 2016-08-19 MED ORDER — BIVALIRUDIN 250 MG IV SOLR
INTRAVENOUS | Status: AC
  Filled 2016-08-19: qty 250

## 2016-08-19 MED ORDER — MIDAZOLAM HCL 2 MG/2ML IJ SOLN
INTRAMUSCULAR | Status: DC | PRN
  Administered 2016-08-19: 1 mg via INTRAVENOUS

## 2016-08-19 MED ORDER — NITROGLYCERIN IN D5W 200-5 MCG/ML-% IV SOLN
INTRAVENOUS | Status: AC
  Filled 2016-08-19: qty 250

## 2016-08-19 MED ORDER — OXYCODONE HCL 5 MG PO TABS: 5.0000 mg | ORAL_TABLET | ORAL | Status: DC | PRN

## 2016-08-19 MED ORDER — ACETAMINOPHEN 325 MG PO TABS: 650.0000 mg | ORAL_TABLET | ORAL | Status: DC | PRN

## 2016-08-19 MED ORDER — SODIUM CHLORIDE 0.9 % IV SOLN
INTRAVENOUS | Status: DC | PRN
  Administered 2016-08-19: 1.75 mg/kg/h via INTRAVENOUS

## 2016-08-19 MED ORDER — HYDROXYZINE PAMOATE 25 MG PO CAPS
25.0000 mg | ORAL_CAPSULE | Freq: Every day | ORAL | Status: DC
  Administered 2016-08-19: 23:00:00 25 mg via ORAL
  Filled 2016-08-19 (×2): qty 1

## 2016-08-19 MED ORDER — DIPHENHYDRAMINE HCL 25 MG PO CAPS
25.0000 mg | ORAL_CAPSULE | Freq: Every day | ORAL | Status: DC | PRN
  Administered 2016-08-20 – 2016-08-21 (×2): 25 mg via ORAL
  Filled 2016-08-19: qty 2
  Filled 2016-08-19 (×2): qty 1
  Filled 2016-08-19 (×2): qty 2

## 2016-08-19 MED ORDER — OXYCODONE-ACETAMINOPHEN 5-325 MG PO TABS: 1.0000 | ORAL_TABLET | ORAL | Status: DC | PRN

## 2016-08-19 MED ORDER — TICAGRELOR 90 MG PO TABS
90.0000 mg | ORAL_TABLET | Freq: Two times a day (BID) | ORAL | Status: DC
  Administered 2016-08-20 – 2016-08-21 (×4): 90 mg via ORAL
  Filled 2016-08-19 (×4): qty 1

## 2016-08-19 MED ORDER — HYDRALAZINE HCL 20 MG/ML IJ SOLN: 10.0000 mg | INTRAMUSCULAR | Status: DC | PRN

## 2016-08-19 MED ORDER — ONDANSETRON HCL 4 MG/2ML IJ SOLN
INTRAMUSCULAR | Status: AC
  Filled 2016-08-19: qty 2

## 2016-08-19 MED ORDER — FENTANYL CITRATE (PF) 100 MCG/2ML IJ SOLN: 50.0000 ug | INTRAMUSCULAR | Status: DC | PRN

## 2016-08-19 MED ORDER — SODIUM CHLORIDE 0.9% FLUSH: 3.0000 mL | INTRAVENOUS | Status: DC | PRN

## 2016-08-19 MED ORDER — IOPAMIDOL (ISOVUE-370) INJECTION 76%
INTRAVENOUS | Status: DC | PRN
  Administered 2016-08-19: 80 mL via INTRA_ARTERIAL

## 2016-08-19 MED ORDER — MIDAZOLAM HCL 2 MG/2ML IJ SOLN
INTRAMUSCULAR | Status: AC
  Filled 2016-08-19: qty 2

## 2016-08-19 MED ORDER — OXYCODONE-ACETAMINOPHEN 5-325 MG PO TABS
1.0000 | ORAL_TABLET | ORAL | Status: DC | PRN
  Administered 2016-08-19 – 2016-08-20 (×2): 1 via ORAL
  Filled 2016-08-19 (×2): qty 1

## 2016-08-19 MED ORDER — ONDANSETRON HCL 4 MG/2ML IJ SOLN
INTRAMUSCULAR | Status: DC | PRN
  Administered 2016-08-19: 4 mg via INTRAVENOUS

## 2016-08-19 MED ORDER — GABAPENTIN 100 MG PO CAPS
200.0000 mg | ORAL_CAPSULE | Freq: Two times a day (BID) | ORAL | Status: DC
  Administered 2016-08-19 – 2016-08-21 (×4): 200 mg via ORAL
  Filled 2016-08-19 (×4): qty 2

## 2016-08-19 MED ORDER — NITROGLYCERIN IN D5W 200-5 MCG/ML-% IV SOLN
INTRAVENOUS | Status: DC | PRN
  Administered 2016-08-19: 10 ug/min via INTRAVENOUS

## 2016-08-19 MED ORDER — DIMENHYDRINATE 50 MG PO TABS
50.0000 mg | ORAL_TABLET | Freq: Three times a day (TID) | ORAL | Status: DC | PRN
  Filled 2016-08-19: qty 1

## 2016-08-19 MED ORDER — FENTANYL CITRATE (PF) 100 MCG/2ML IJ SOLN
INTRAMUSCULAR | Status: DC | PRN
  Administered 2016-08-19: 25 ug via INTRAVENOUS

## 2016-08-19 MED ORDER — HEPARIN (PORCINE) IN NACL 100-0.45 UNIT/ML-% IJ SOLN
1050.0000 [IU]/h | INTRAMUSCULAR | Status: DC
  Administered 2016-08-19: 1050 [IU]/h via INTRAVENOUS
  Filled 2016-08-19: qty 250

## 2016-08-19 MED ORDER — ONDANSETRON 8 MG PO TBDP
8.0000 mg | ORAL_TABLET | Freq: Three times a day (TID) | ORAL | Status: DC | PRN
  Filled 2016-08-19: qty 1

## 2016-08-19 MED ORDER — SODIUM CHLORIDE 0.9% FLUSH
3.0000 mL | Freq: Two times a day (BID) | INTRAVENOUS | Status: DC
  Administered 2016-08-19 – 2016-08-20 (×3): 3 mL via INTRAVENOUS

## 2016-08-19 MED ORDER — ATROPINE SULFATE 1 MG/10ML IJ SOSY
PREFILLED_SYRINGE | INTRAMUSCULAR | Status: AC
  Filled 2016-08-19: qty 10

## 2016-08-19 MED ORDER — ASPIRIN 81 MG PO CHEW: 81.0000 mg | CHEWABLE_TABLET | Freq: Every day | ORAL | Status: DC

## 2016-08-19 MED ORDER — ATORVASTATIN CALCIUM 80 MG PO TABS
80.0000 mg | ORAL_TABLET | Freq: Every day | ORAL | Status: DC
  Administered 2016-08-20: 18:00:00 80 mg via ORAL
  Filled 2016-08-19: qty 1

## 2016-08-19 MED ORDER — ASPIRIN EC 81 MG PO TBEC
81.0000 mg | DELAYED_RELEASE_TABLET | Freq: Every day | ORAL | Status: DC
  Administered 2016-08-19 – 2016-08-21 (×3): 81 mg via ORAL
  Filled 2016-08-19 (×3): qty 1

## 2016-08-19 MED ORDER — LIDOCAINE HCL (PF) 1 % IJ SOLN
INTRAMUSCULAR | Status: DC | PRN
Start: 2016-08-19 — End: 2016-08-19
  Administered 2016-08-19: 15 mL via INTRADERMAL

## 2016-08-19 MED ORDER — SODIUM CHLORIDE 0.9 % IV SOLN
INTRAVENOUS | Status: DC
  Administered 2016-08-19: 15:00:00 via INTRAVENOUS

## 2016-08-19 MED ORDER — TICAGRELOR 90 MG PO TABS
ORAL_TABLET | ORAL | Status: DC | PRN
  Administered 2016-08-19: 180 mg via ORAL

## 2016-08-19 MED ORDER — HEPARIN (PORCINE) IN NACL 2-0.9 UNIT/ML-% IJ SOLN
INTRAMUSCULAR | Status: AC
  Filled 2016-08-19: qty 1500

## 2016-08-19 SURGICAL SUPPLY — 14 items
BALLN MOZEC 2.0X15 (BALLOONS) ×2
BALLOON MOZEC 2.0X15 (BALLOONS) ×1 IMPLANT
CATH INFINITI 5FR MULTPACK ANG (CATHETERS) ×2 IMPLANT
CATH VISTA GUIDE 6FR XB3.5 (CATHETERS) ×2 IMPLANT
GUIDEWIRE 3MM J TIP .035 145 (WIRE) ×2 IMPLANT
KIT ENCORE 26 ADVANTAGE (KITS) ×2 IMPLANT
KIT HEART LEFT (KITS) ×2 IMPLANT
PACK CARDIAC CATHETERIZATION (CUSTOM PROCEDURE TRAY) ×2 IMPLANT
SHEATH PINNACLE 5F 10CM (SHEATH) ×2 IMPLANT
SHEATH PINNACLE 6F 10CM (SHEATH) ×2 IMPLANT
STENT RESOLUTE INTEG 2.75X22 (Permanent Stent) ×2 IMPLANT
SYR MEDRAD MARK V 150ML (SYRINGE) ×2 IMPLANT
TRANSDUCER W/STOPCOCK (MISCELLANEOUS) ×2 IMPLANT
WIRE COUGAR XT STRL 190CM (WIRE) ×2 IMPLANT

## 2016-08-19 NOTE — Progress Notes (Signed)
ANTICOAGULATION CONSULT NOTE - Initial Consult  Pharmacy Consult for Heparin Indication: chest pain/ACS  Allergies  Allergen Reactions  . Bee Venom Anaphylaxis  . Coconut Flavor Anaphylaxis    Per patient  . Mushroom Extract Complex Anaphylaxis    Extreme sweating, vomiting  . Amitriptyline Other (See Comments)    Has nightmares when using, not in right state of mind   . Toradol [Ketorolac Tromethamine] Nausea And Vomiting  . Tramadol Nausea And Vomiting  . Trazodone And Nefazodone Nausea And Vomiting    Patient Measurements: Height: 5\' 4"  (162.6 cm) Weight: 207 lb (93.9 kg) IBW/kg (Calculated) : 54.7 Heparin Dosing Weight:  76 kg  Vital Signs: Temp: 97.6 F (36.4 C) (11/07 1226) Temp Source: Oral (11/07 1226) BP: 148/76 (11/07 1226) Pulse Rate: 72 (11/07 1226)  Labs:  Recent Labs  08/19/16 1320  HGB 13.7  HCT 43.6  PLT 227    CrCl cannot be calculated (Patient's most recent lab result is older than the maximum 21 days allowed.).   Medical History: Past Medical History:  Diagnosis Date  . Anxiety   . Arthritis   . Bronchitis   . Chronic back pain   . Chronic knee pain   . COPD (chronic obstructive pulmonary disease) (HCC)   . 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 Dizziness   . Frequent falls   . GERD (gastroesophageal reflux disease)   . Hypertension   . Memory difficulty 2015  . Neuropathy (HCC)   . Pain management   . Prediabetes   . 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 Shortness of breath dyspnea    with exertion  . 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 Weakness of both legs     Medications:  F/u med rec  Assessment: 65 y/o F with 1d h/o CP and weakness. EKG is sinus rhythm with non-specific ST-T changes. Transferred from [place].   [facility] labs: H/H 14.1/43/4, Plts 248 Ddimer 0.42 ProBNP 147 K=4, Scr 0.4, Glucose 145, Albumin  4.1, Alkphos 102  Anticoag: IV heparin for ACS. Started at [facility] but RN reports it was turned off by EMS due to rash that was on her hands. Off for unknown length of time.  Goal of Therapy:  Heparin level 0.3-0.7 units/ml Monitor platelets by anticoagulation protocol: Yes   Plan:  Received Heparin 3000 unit bolus at [facility] 937, infusion 1120 units/hr. -Resume IV heparin at 1050 units/hr -Check heparin level in 6 hrs. - Daily HL and CBC   Chelbi Herber S. Merilynn Finland, PharmD, BCPS Clinical Staff Pharmacist Pager [phone]  Misty Stanley Stillinger 08/19/2016,1:59 PM

## 2016-08-19 NOTE — Interval H&P Note (Signed)
History and Physical Interval Note:  08/19/2016 4:20 PM  Carla [name]  has presented today for surgery, with the diagnosis of cp  The various methods of treatment have been discussed with the patient and family. After consideration of risks, benefits and other options for treatment, the patient has consented to  Procedure(s): Left Heart Cath and Coronary Angiography (N/A) as a surgical intervention .  The patient's history has been reviewed, patient examined, no change in status, stable for surgery.  I have reviewed the patient's chart and labs.  Questions were answered to the patient's satisfaction.     [name]

## 2016-08-19 NOTE — H&P (Signed)
Referring Physician:  Nathaniel ManSherry M Dalesandro is an 65 y.o. female.                       Chief Complaint: Chest pain  HPI: 65 year old female has 1 day history of typical chest pain followed by weakness yesterday evening while walking her dog. Her Troponin level is minimally elevated and EKG is sinus rhythm with non-specific ST-T changes. She has PMD of Hypertension, Prediabetes, Back pain with lower back surgery 3 months ago and Arhtritis.  Past Medical History:  Diagnosis Date  . Anxiety   . Arthritis   . Bronchitis   . Chronic back pain   . Chronic knee pain   . COPD (chronic obstructive pulmonary disease) (HCC)   . 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 Dizziness   . Frequent falls   . GERD (gastroesophageal reflux disease)   . Hypertension   . Memory difficulty 2015  . Neuropathy (HCC)   . Pain management   . Prediabetes   . 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 Shortness of breath dyspnea    with exertion  . 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 Weakness of both legs       Past Surgical History:  Procedure Laterality Date  . APPENDECTOMY  1971  . BACK SURGERY     Lumbar Fusion  . CHOLECYSTECTOMY N/A 03/12/2016   Procedure: LAPAROSCOPIC CHOLECYSTECTOMY;  Surgeon: Ancil LinseyJason Evan Davis, MD;  Location: [facility];  Service: General;  Laterality: N/A;  . KNEE CARTILAGE SURGERY Right 2007  . TONSILLECTOMY  1971    Family History  Problem Relation Age of Onset  . Heart failure Father   . Diabetes Father   . Kidney failure Father    Social History:  reports that she has quit smoking. Her smoking use included Cigarettes. She started smoking about 46 years ago. She has a 12.50 pack-year smoking history. She has never used smokeless tobacco. She reports that she does not drink alcohol or use drugs.  Allergies:  Allergies  Allergen Reactions  . Bee Venom Anaphylaxis  . Coconut  Flavor Anaphylaxis    Per patient  . Mushroom Extract Complex Anaphylaxis    Extreme sweating, vomiting  . Amitriptyline Other (See Comments)    Has nightmares when using, not in right state of mind   . Toradol [Ketorolac Tromethamine] Nausea And Vomiting  . Tramadol Nausea And Vomiting  . Trazodone And Nefazodone Nausea And Vomiting    Medications Prior to Admission  Medication Sig Dispense Refill  . albuterol (PROVENTIL HFA;VENTOLIN HFA) 108 (90 BASE) MCG/ACT inhaler Inhale 2 puffs into the lungs every 6 (six) hours as needed for wheezing. 1 Inhaler 2  . amLODipine (NORVASC) 2.5 MG tablet Take 2 tablets (5 mg total) by mouth daily.    Marland Kitchen. aspirin EC 81 MG tablet Take 81 mg by mouth daily.    . Cholecalciferol (VITAMIN D) 2000 units CAPS Take 2,000 Units by mouth daily.     . cyclobenzaprine (FLEXERIL) 10 MG tablet Take 10 mg by mouth 3 (three) times daily.     . diazepam (VALIUM) 5 MG tablet Take 1 tablet (5 mg total) by mouth 3 (three) times daily as needed for anxiety. 60 tablet 0  . dimenhyDRINATE (DRAMAMINE) 50 MG tablet Take 50 mg by mouth every 8 (eight) hours as needed for dizziness.    . diphenhydrAMINE (BENADRYL) 25 MG  tablet Take 25-50 mg by mouth daily as needed for itching or allergies.    . DULoxetine (CYMBALTA) 60 MG capsule Take 60 mg by mouth daily.     Marland Kitchen EVZIO 0.4 MG/0.4ML SOAJ Inject 0.4 mLs into the muscle once as needed (for Anaphylaxis/allergic reaction).     . gabapentin (NEURONTIN) 100 MG capsule Take 200 mg by mouth 2 (two) times daily.     . hydrOXYzine (VISTARIL) 25 MG capsule Take 25 mg by mouth at bedtime.    . Magnesium Hydroxide (MILK OF MAGNESIA PO) Give 30 ml by mouth as needed for constipation    . metoprolol succinate (TOPROL-XL) 50 MG 24 hr tablet Take 50 mg by mouth at bedtime.    Marland Kitchen omeprazole (PRILOSEC) 20 MG capsule Take 1 capsule (20 mg total) by mouth daily. 30 capsule 0  . ondansetron (ZOFRAN ODT) 8 MG disintegrating tablet Take 1 tablet (8 mg  total) by mouth every 8 (eight) hours as needed for nausea or vomiting. 20 tablet 0  . oxyCODONE-acetaminophen (PERCOCET) 10-325 MG tablet Take 1 tablet by mouth every 4 (four) hours as needed for pain. Max APAP 3gm/24 hrs from all sources (Patient not taking: Reported on 07/08/2016) 180 tablet 0    No results found for this or any previous visit (from the past 48 hour(s)). No results found.  Review Of Systems Constitutional: No fever, chills , weight loss or gain. Eyes: No vision change, Wears glasses, no contact lens or catararact surgery. Ears: No hearing loss, No tinnitus. Respiratory: No asthma, positive COPD and shortness of breath. No hemoptysis. Cardiovascular: Recent chest pain, no palpitation or leg edema. Gastrointestinal: No nausea, vomiting or diarrhea or constipation. No GI bleed. No hepatitis. Genitourinary: No dysuria, hematuria or kidney stone. No incontinance. Neurological: No headache, stroke or seizures.  Psychiatry: No psych facility admission for anxiety, depression or suicide. No detox. Skin: No rash. Musculoskeletal: Positive joint pain, no fibromyalgia. Positive back pain. Lymphadenopathy: No lymphadenopathy. Hematology: No anemia or easy bruising.   Blood pressure (!) 148/76, pulse 72, temperature 97.6 F (36.4 C), temperature source Oral, resp. rate 12, SpO2 100 %. There is no height or weight on file to calculate BMI. General appearance: alert, cooperative, appears stated age and mild distress Head: Normocephalic, atraumatic Eyes: Pink conjunctivae/corneas clear. PERRL, EOM's intact. Neck: No adenopathy, no carotid bruit, no JVD, supple, symmetrical, trachea midline and thyroid not enlarged. Resp: Clear to auscultation bilaterally. Forced expiratory wheeze. Cardio: Regular rate and rhythm, S1, S2 normal, no murmur, click, rub or gallop GI: soft, non-tender; bowel sounds normal; no masses,  no organomegaly Extremities: No cyanosis or edema Skin: Warm and  dry. No rashes or lesions Neurologic: Alert and oriented X 3, normal strength and tone. Normal coordination.  Assessment/Plan Acute coronary syndrome Hypertension COPD Obesity  Admit/IV heparin/Cardiac catheterization with possible angioplasty.  Ricki Rodriguez, MD  08/19/2016, 1:16 PM

## 2016-08-19 NOTE — Progress Notes (Signed)
I have reviewed the labs, physical exam and clinical presentation and also reviewed the angiograms. Presentation is consistent with ACS, LAD appears to be a culprit and she has a chronic stable distal RCA stenosis. QUESTIONS answered, discussed with the patient, she is amenable.

## 2016-08-20 ENCOUNTER — Encounter (HOSPITAL_COMMUNITY): Payer: Self-pay | Admitting: Cardiovascular Disease

## 2016-08-20 LAB — CBC
HCT: 42.4 % (ref 36.0–46.0)
Hemoglobin: 13.6 g/dL (ref 12.0–15.0)
MCH: 29 pg (ref 26.0–34.0)
MCHC: 32.1 g/dL (ref 30.0–36.0)
MCV: 90.4 fL (ref 78.0–100.0)
PLATELETS: 223 10*3/uL (ref 150–400)
RBC: 4.69 MIL/uL (ref 3.87–5.11)
RDW: 13.4 % (ref 11.5–15.5)
WBC: 9.5 10*3/uL (ref 4.0–10.5)

## 2016-08-20 LAB — BASIC METABOLIC PANEL
Anion gap: 6 (ref 5–15)
BUN: 6 mg/dL (ref 6–20)
CALCIUM: 8.7 mg/dL — AB (ref 8.9–10.3)
CHLORIDE: 107 mmol/L (ref 101–111)
CO2: 24 mmol/L (ref 22–32)
CREATININE: 0.56 mg/dL (ref 0.44–1.00)
GFR calc non Af Amer: >60 mL/min (ref >60)
GLUCOSE: 157 mg/dL — AB (ref 65–99)
Potassium: 3.5 mmol/L (ref 3.5–5.1)
Sodium: 137 mmol/L (ref 135–145)

## 2016-08-20 LAB — LIPID PANEL
CHOL/HDL RATIO: 4.6 ratio
CHOLESTEROL: 164 mg/dL (ref 0–200)
HDL: 36 mg/dL — AB (ref >40)
LDL Cholesterol: 99 mg/dL (ref 0–99)
TRIGLYCERIDES: 144 mg/dL (ref <150)
VLDL: 29 mg/dL (ref 0–40)

## 2016-08-20 LAB — TROPONIN I: Troponin I: 0.21 ng/mL (ref <0.03)

## 2016-08-20 LAB — POCT ACTIVATED CLOTTING TIME: ACTIVATED CLOTTING TIME: 412 s

## 2016-08-20 MED ORDER — OXYCODONE HCL 5 MG PO TABS
5.0000 mg | ORAL_TABLET | ORAL | Status: DC | PRN
  Administered 2016-08-20 – 2016-08-21 (×3): 5 mg via ORAL
  Filled 2016-08-20 (×3): qty 1

## 2016-08-20 MED ORDER — ANGIOPLASTY BOOK
Freq: Once | Status: AC
  Administered 2016-08-20: 03:00:00 1
  Filled 2016-08-20: qty 1

## 2016-08-20 MED ORDER — OXYCODONE-ACETAMINOPHEN 5-325 MG PO TABS
1.0000 | ORAL_TABLET | ORAL | Status: DC | PRN
  Administered 2016-08-20 – 2016-08-21 (×3): 1 via ORAL
  Filled 2016-08-20 (×3): qty 1

## 2016-08-20 MED ORDER — HYDROXYZINE HCL 25 MG PO TABS
25.0000 mg | ORAL_TABLET | Freq: Every day | ORAL | Status: DC
  Administered 2016-08-20: 25 mg via ORAL
  Filled 2016-08-20: qty 1

## 2016-08-20 MED ORDER — HEART ATTACK BOUNCING BOOK
Freq: Once | Status: AC
  Administered 2016-08-20: 22:00:00
  Filled 2016-08-20: qty 1

## 2016-08-20 NOTE — Progress Notes (Signed)
CARDIAC REHAB PHASE I   PRE:  Rate/Rhythm: 71 SR  BP:  Supine:   Sitting: 110/76  Standing:    SaO2: 98%RA  MODE:  Ambulation: 180 ft   POST:  Rate/Rhythm: 73 SR  BP:  Supine: 112/62  Sitting:   Standing:    SaO2: 98%RA [id number] Pt was up in room when I entered and she needed to go to bathroom. Unsteady. Assisted pt in bathroom and then walked her to wash her hands. Stated she needed to sit quickly. Had pt sit and rest. We then walked 180 ft on RA with gait belt use, rolling walker and one assist. One assist followed with wheelchair and pt had to sit once to rest her back and legs. Pt stated she fell about a week ago. She lives alone. Would recommend PT eval here to see for discharge needs. Pt has rollator at home. Stressed importance of calling for help when up and put bed alarm on after pt settled in bed. Discussed CRP 2 and pt stated she could arrange transportation so will refer to [facility]. Discussed NTG use, importance of brilinta with stent, heart healthy diet and watching carbs. Will follow up tomorrow. Pt needs to see case manager re brilinta.   Luetta Nutting, RN BSN  08/20/2016 9:08 AM

## 2016-08-20 NOTE — Care Management Note (Addendum)
Case Management Note  Patient Details  Name: Carla [name] MRN: [id number] Date of Birth: 1951/07/21  Subjective/Objective:     S/p coronary stent intervention, she lives alone , she is active with [facility] for [facility] Social worker/HHPT, would like to resume.  She has pcp Dr. [name] at [facility].  Patient has a w/chair, bsc, hospital bed and rolling walker.  Narda Amber with [employer] with [facility] with the integrated health care team,  helps patient with what ever she needs.  Patient has an apartment at [address] [place] in [place]  [address]. NCM notified Lupita Leash with [facility]  That patient would like to resume.  NCM gave patient a 30 day savings card for brilinta.  She goes to [facility], they do not have in stock , will need to get script to them today so they can order the brilinta.    08/21/16 Letha Cape RN BSN - MD gave patient some samples of brilinta before she was dc to home.  [facility] rep Clydie Braun notified of discharge.             Action/Plan:   Expected Discharge Date:                  Expected Discharge Plan:  Home w Home Health Services  In-House Referral:     Discharge planning Services  CM Consult  Post Acute Care Choice:  Resumption of Svcs/PTA Provider Choice offered to:  Patient  DME Arranged:    DME Agency:     HH Arranged:  RN, PT HH Agency:  [facility]  Status of Service:  Completed, signed off  If discussed at Long Length of Stay Meetings, dates discussed:    Additional Comments:  Leone Haven, RN 08/20/2016, 10:21 AM

## 2016-08-20 NOTE — Progress Notes (Signed)
.  BRILINTA 90 MG BID  * NOT ON FORMULARY *   2. CLOPIDOGREL 75 MG    COVER- YES  CO-PAY- ZERO DOLLARS  PRIOR APPROVAL- NO  PHARMACY : [facility]

## 2016-08-20 NOTE — Progress Notes (Signed)
Site area: R groin  Site Prior to Removal:  Level 0  Minutes Beginning at 22:40  Manual:   yes  Patient Status During Pull:  Anxious Tearful  Post Pull Groin Site:  Level {NUMBERS; 0  Post Pull Instructions Given:  Yes  Post Pull Pulses Present:  Yes  Dressing Applied:  Yes  Comments:  Teaching reinforced upon completion. Pt tolerated procedure fairly well

## 2016-08-20 NOTE — Progress Notes (Signed)
Ref: Ernestine ConradBLUTH, KIRK, MD   Subjective:  Feeling better. No chest pain. VS stable. Decreased mobility due to back pain.  Objective:  Vital Signs in the last 24 hours: Temp:  [97.5 F (36.4 C)-97.9 F (36.6 C)] 97.7 F (36.5 C) (11/08 1537) Pulse Rate:  [47-82] 73 (11/08 1537) Cardiac Rhythm: Normal sinus rhythm (11/08 0700) Resp:  [12-28] 16 (11/08 1537) BP: (89-183)/(41-82) 111/46 (11/08 1537) SpO2:  [92 %-97 %] 93 % (11/08 1537) Weight:  [88.7 kg (195 lb 8.8 oz)] 88.7 kg (195 lb 8.8 oz) (11/08 0400)  Physical Exam: BP Readings from Last 1 Encounters:  08/20/16 (!) 111/46    Wt Readings from Last 1 Encounters:  08/20/16 88.7 kg (195 lb 8.8 oz)    Weight change:  Body mass index is 33.57 kg/m. HEENT: Branch/AT, Eyes- PERL, EOMI, Conjunctiva-Pink, Sclera-Non-icteric Neck: No JVD, No bruit, Trachea midline. Lungs:  Clear, Bilateral. Cardiac:  Regular rhythm, normal S1 and S2, no S3. II/VI systolic murmur. Abdomen:  Soft, non-tender. BS present. Back: Lower back scar of surgery. Extremities:  No edema present. No cyanosis. No clubbing. CNS: AxOx3, Cranial nerves grossly intact, moves all 4 extremities.  Skin: Warm and dry.   Intake/Output from previous day: 11/07 0701 - 11/08 0700 In: 34.1 [I.V.:34.1] Out: 900 [Urine:900]    Lab Results: BMET    Component Value Date/Time   NA 137 08/20/2016 0046   NA 142 08/19/2016 1320   NA 139 07/08/2016 1031   K 3.5 08/20/2016 0046   K 3.8 08/19/2016 1320   K 3.6 07/08/2016 1031   CL 107 08/20/2016 0046   CL 109 08/19/2016 1320   CL 102 07/08/2016 1031   CO2 24 08/20/2016 0046   CO2 26 08/19/2016 1320   CO2 25 07/08/2016 1031   GLUCOSE 157 (H) 08/20/2016 0046   GLUCOSE 92 08/19/2016 1320   GLUCOSE 114 (H) 07/08/2016 1031   BUN 6 08/20/2016 0046   BUN 7 08/19/2016 1320   BUN 8 07/08/2016 1031   CREATININE 0.56 08/20/2016 0046   CREATININE 0.49 08/19/2016 1320   CREATININE 0.78 07/08/2016 1031   CALCIUM 8.7 (L) 08/20/2016  0046   CALCIUM 9.0 08/19/2016 1320   CALCIUM 9.4 07/08/2016 1031   GFRNONAA >60 08/20/2016 0046   GFRNONAA >60 08/19/2016 1320   GFRNONAA >60 07/08/2016 1031   GFRAA >60 08/20/2016 0046   GFRAA >60 08/19/2016 1320   GFRAA >60 07/08/2016 1031   CBC    Component Value Date/Time   WBC 9.5 08/20/2016 0046   RBC 4.69 08/20/2016 0046   HGB 13.6 08/20/2016 0046   HCT 42.4 08/20/2016 0046   HCT 46.2 07/16/2015 1628   PLT 223 08/20/2016 0046   PLT 363 07/16/2015 1628   MCV 90.4 08/20/2016 0046   MCV 90 07/16/2015 1628   MCH 29.0 08/20/2016 0046   MCHC 32.1 08/20/2016 0046   RDW 13.4 08/20/2016 0046   RDW 13.6 07/16/2015 1628   LYMPHSABS 5.1 (H) 08/19/2016 1320   LYMPHSABS 5.6 (H) 07/16/2015 1628   MONOABS 0.7 08/19/2016 1320   EOSABS 0.3 08/19/2016 1320   EOSABS 0.0 07/16/2015 1628   BASOSABS 0.0 08/19/2016 1320   BASOSABS 0.0 07/16/2015 1628   HEPATIC Function Panel  Recent Labs  04/17/16 1302 06/27/16 1805 07/08/16 1031  PROT 7.1 6.7 8.2*   HEMOGLOBIN A1C No components found for: HGA1C,  MPG CARDIAC ENZYMES Lab Results  Component Value Date   CKTOTAL 29 (L) 06/30/2015   CKMB 4.2 (H) 03/08/2012  TROPONINI 0.21 (HH) 08/20/2016   TROPONINI 0.11 (HH) 08/19/2016   TROPONINI 0.12 (HH) 08/19/2016   BNP No results for input(s): PROBNP in the last 8760 hours. TSH  Recent Labs  12/21/15 1435 03/07/16 0624 03/10/16 1600  TSH 2.655 8.202* 3.083   CHOLESTEROL  Recent Labs  08/19/16 1920 08/20/16 0046  CHOL 173 164    Scheduled Meds: . amLODipine  5 mg Oral Daily  . aspirin EC  81 mg Oral Daily  . atorvastatin  80 mg Oral q1800  . DULoxetine  60 mg Oral Daily  . feeding supplement (ENSURE ENLIVE)  237 mL Oral BID BM  . gabapentin  200 mg Oral BID  . hydrOXYzine  25 mg Oral QHS  . metoprolol succinate  50 mg Oral QHS  . pantoprazole  40 mg Oral Daily  . sodium chloride flush  3 mL Intravenous Q12H  . ticagrelor  90 mg Oral BID   Continuous  Infusions: PRN Meds:.sodium chloride, acetaminophen, albuterol, diazepam, dimenhyDRINATE, diphenhydrAMINE, fentaNYL (SUBLIMAZE) injection, hydrALAZINE, magnesium hydroxide, nitroGLYCERIN, ondansetron (ZOFRAN) IV, ondansetron, oxyCODONE-acetaminophen **AND** oxyCODONE, sodium chloride flush  Assessment/Plan: Small NSTEMI S/P LAD stent. Hypertension COPD Obesity  Increase activity. Home in AM.   LOS: 1 day    Orpah Cobb  MD  08/20/2016, 6:50 PM

## 2016-08-21 MED ORDER — CYCLOBENZAPRINE HCL 10 MG PO TABS
5.0000 mg | ORAL_TABLET | Freq: Once | ORAL | Status: AC
  Administered 2016-08-21: 11:00:00 5 mg via ORAL
  Filled 2016-08-21: qty 1

## 2016-08-21 MED ORDER — LISINOPRIL 5 MG PO TABS: 5.0000 mg | ORAL_TABLET | Freq: Every day | ORAL | 3 refills | Status: DC

## 2016-08-21 MED ORDER — LISINOPRIL 5 MG PO TABS: 5.0000 mg | ORAL_TABLET | Freq: Every day | ORAL | Status: DC

## 2016-08-21 MED ORDER — TICAGRELOR 90 MG PO TABS: 90.0000 mg | ORAL_TABLET | Freq: Two times a day (BID) | ORAL | 6 refills | Status: DC

## 2016-08-21 MED ORDER — ATORVASTATIN CALCIUM 80 MG PO TABS: 80.0000 mg | ORAL_TABLET | Freq: Every day | ORAL | 6 refills | Status: AC

## 2016-08-21 MED ORDER — NITROGLYCERIN 0.4 MG SL SUBL: 0.4000 mg | SUBLINGUAL_TABLET | SUBLINGUAL | 1 refills | Status: AC | PRN

## 2016-08-21 MED FILL — Heparin Sodium (Porcine) 2 Unit/ML in Sodium Chloride 0.9%: INTRAMUSCULAR | Qty: 500 | Status: AC

## 2016-08-21 NOTE — Progress Notes (Signed)
CARDIAC REHAB PHASE I   PRE:  Rate/Rhythm: 48 SB  BP:  Sitting: 115/54        SaO2: 97 RA  MODE:  Ambulation: 220 ft   POST:  Rate/Rhythm: 70 SR  BP:  Sitting: 143/87         SaO2: 97 RA  Pt up ad lib in room this morning. Pt ambulated 220 ft on RA, rolling walker, gait belt, assist x2 (to follow with wheelchair, pt also requested to have music playing on tablet device during walk), mostly steady gait, tolerated well.  Pt c/o some increased fatigue with distance, brief standing rest x1, denies any other complaints. Pt able to increase distance today, did not require seated rest break. Pt to recliner after walk, call bell within reach.  [id number] Joylene Grapes, RN, BSN 08/21/2016 8:31 AM

## 2016-08-21 NOTE — Discharge Summary (Signed)
Physician Discharge Summary  Patient ID: Carla [name] MRN: [id number] DOB/AGE: 1951-03-30 65 y.o.  Admit date: 08/19/2016 Discharge date: 08/21/2016  Admission Diagnoses: Chest pain Hypertension COPD Obesity  Discharge Diagnoses:  Principal Problem: *NSTEMI*   Active Problems:   [name]/P LAD stent   COPD   Essential hypertension   Spinal stenosis, lumbar region, with neurogenic claudication   Obesity, morbid (HCC)   Right knee pain   Gait abnormality  Discharged Condition: fair  Hospital Course: 65 year old female presented with 1 day history of typical chest pain followed by weakness day before admission while walking her dog. She had small NSTEMI. She had cardiac cath showing multivessel CAD but proximal LAD 95 % stenosis was critical and was treated with 2.75 mm drug eluting stent. She hit on her right knee while trying to get ready for discharge and was able to hold on to the chair before lowered to ground by me and two nurses on the floor. She was advised to use wheel chair until she is seen by her orthopedic doctor. She refused X-ray on her right knee as she had one done by her orthopedic doctor recently. She also understood her right knee surgery will be postponed for 6-12 months while she is on Brilinta. She will see me in 1 week and Primary care in 1 month.  Consults: cardiology  Significant Diagnostic Studies: labs: Normal CBC, BMET and near normal Lipid panel except low HDL.  EKG: SB with anterolateral ischemia.  Cardiac cath: Multivessel CAD. Drug eluting Stent 2.75 x 22 mm resolute in proximal LAD  Treatments: cardiac meds: metoprolol, amlodipine and Brilinta, Atorvastatin and aspirin.  Discharge Exam: Blood pressure (!) 135/92, pulse 69, temperature 97.8 F (36.6 C), temperature source Oral, resp. rate 18, height 5\' 4"  (1.626 m), weight 93 kg (205 lb), SpO2 98 %. General appearance: alert, cooperative, appears stated age and mild distress Head: Normocephalic,  atraumatic Eyes: Conjunctivae/corneas clear. PERRL, EOM'[name] intact. Fundi benign. Neck: No adenopathy, no carotid bruit, no JVD, supple, symmetrical, trachea midline and thyroid not enlarged. Resp: Clear to auscultation bilaterally Cardio: regular rate and rhythm, S1, S2 normal, no murmur, click, rub or gallop GI: soft, non-tender; bowel sounds normal; no masses,  no organomegaly Extremities: Bilateral knee swelling, moderate left knee tenderness and limited capacity to bend, no cyanosis. Skin: Warm and dry. No rashes or lesions Neurologic: Alert and oriented X 3, normal strength and tone. Slow gait.  Disposition: 01-Home or Self Care  Discharge Instructions    Amb Referral to Cardiac Rehabilitation    Complete by:  As directed    Diagnosis:  Coronary Stents       Medication List    TAKE these medications   albuterol 108 (90 Base) MCG/ACT inhaler Commonly known as:  PROVENTIL HFA;VENTOLIN HFA Inhale 2 puffs into the lungs every 6 (six) hours as needed for wheezing.   amLODipine 2.5 MG tablet Commonly known as:  NORVASC Take 2 tablets (5 mg total) by mouth daily.   aspirin EC 81 MG tablet Take 81 mg by mouth daily.   atorvastatin 80 MG tablet Commonly known as:  LIPITOR Take 1 tablet (80 mg total) by mouth daily at 6 PM.   cyclobenzaprine 10 MG tablet Commonly known as:  FLEXERIL Take 10 mg by mouth 3 (three) times daily.   diazepam 5 MG tablet Commonly known as:  VALIUM Take 1 tablet (5 mg total) by mouth 3 (three) times daily as needed for anxiety.   dimenhyDRINATE  50 MG tablet Commonly known as:  DRAMAMINE Take 50 mg by mouth every 8 (eight) hours as needed for dizziness.   diphenhydrAMINE 25 MG tablet Commonly known as:  BENADRYL Take 25-50 mg by mouth daily as needed for itching or allergies.   DULoxetine 60 MG capsule Commonly known as:  CYMBALTA Take 60 mg by mouth daily.   EVZIO 0.4 MG/0.4ML Soaj Generic drug:  Naloxone HCl Inject 0.4 mLs into the  muscle once as needed (for Anaphylaxis/allergic reaction).   gabapentin 100 MG capsule Commonly known as:  NEURONTIN Take 200 mg by mouth 2 (two) times daily.   hydrOXYzine 25 MG capsule Commonly known as:  VISTARIL Take 25 mg by mouth at bedtime.   metoprolol succinate 50 MG 24 hr tablet Commonly known as:  TOPROL-XL Take 50 mg by mouth at bedtime.   MILK OF MAGNESIA PO Give 30 ml by mouth as needed for constipation   nitroGLYCERIN 0.4 MG SL tablet Commonly known as:  NITROSTAT Place 1 tablet (0.4 mg total) under the tongue every 5 (five) minutes x 3 doses as needed for chest pain.   omeprazole 20 MG capsule Commonly known as:  PRILOSEC Take 1 capsule (20 mg total) by mouth daily.   ondansetron 8 MG disintegrating tablet Commonly known as:  ZOFRAN ODT Take 1 tablet (8 mg total) by mouth every 8 (eight) hours as needed for nausea or vomiting.   oxyCODONE-acetaminophen 10-325 MG tablet Commonly known as:  PERCOCET Take 1 tablet by mouth every 4 (four) hours as needed for pain. Max APAP 3gm/24 hrs from all sources   ticagrelor 90 MG Tabs tablet Commonly known as:  BRILINTA Take 1 tablet (90 mg total) by mouth 2 (two) times daily.   Vitamin D 2000 units Caps Take 2,000 Units by mouth daily.   Lisinopril 5 mg. One PO daily.   Follow-up Information    Ernestine ConradBLUTH, KIRK, MD Follow up in 1 month([name]).   Specialty:  Family Medicine Contact information: [name] [place] [address] [phone]        Ricki RodriguezKADAKIA,Torrey Horseman S, MD. Schedule an appointment as soon as possible for a visit in 1 week([name]).   Specialty:  Cardiology Contact information: [address] [place] [address] [phone]           Signed: Ricki RodriguezKADAKIA,Carla [name] 08/21/2016, 10:31 AM

## 2016-08-29 ENCOUNTER — Emergency Department (HOSPITAL_COMMUNITY): Payer: Medicare Other

## 2016-08-29 ENCOUNTER — Encounter (HOSPITAL_COMMUNITY): Payer: Self-pay | Admitting: *Deleted

## 2016-08-29 ENCOUNTER — Emergency Department (HOSPITAL_COMMUNITY)
Admission: EM | Admit: 2016-08-29 | Discharge: 2016-08-29 | Disposition: A | Discharge status: Home / Self Care | Payer: Medicare Other | Attending: Emergency Medicine | Admitting: Emergency Medicine

## 2016-08-29 DIAGNOSIS — R0789 Other chest pain: Secondary | ICD-10-CM | POA: Diagnosis present

## 2016-08-29 DIAGNOSIS — F17210 Nicotine dependence, cigarettes, uncomplicated: Secondary | ICD-10-CM | POA: Insufficient documentation

## 2016-08-29 DIAGNOSIS — I10 Essential (primary) hypertension: Secondary | ICD-10-CM | POA: Insufficient documentation

## 2016-08-29 DIAGNOSIS — Z7982 Long term (current) use of aspirin: Secondary | ICD-10-CM | POA: Insufficient documentation

## 2016-08-29 DIAGNOSIS — J449 Chronic obstructive pulmonary disease, unspecified: Secondary | ICD-10-CM | POA: Insufficient documentation

## 2016-08-29 DIAGNOSIS — Z955 Presence of coronary angioplasty implant and graft: Secondary | ICD-10-CM | POA: Insufficient documentation

## 2016-08-29 DIAGNOSIS — Z7901 Long term (current) use of anticoagulants: Secondary | ICD-10-CM | POA: Diagnosis not present

## 2016-08-29 DIAGNOSIS — I2510 Atherosclerotic heart disease of native coronary artery without angina pectoris: Secondary | ICD-10-CM | POA: Insufficient documentation

## 2016-08-29 DIAGNOSIS — R079 Chest pain, unspecified: Secondary | ICD-10-CM

## 2016-08-29 LAB — CBC WITH DIFFERENTIAL/PLATELET
Basophils Absolute: 0 10*3/uL (ref 0.0–0.1)
Basophils Relative: 0 %
Eosinophils Absolute: 0.1 10*3/uL (ref 0.0–0.7)
Eosinophils Relative: 1 %
HCT: 44.9 % (ref 36.0–46.0)
Hemoglobin: 15 g/dL (ref 12.0–15.0)
Lymphocytes Relative: 50 %
Lymphs Abs: 5.2 10*3/uL — ABNORMAL HIGH (ref 0.7–4.0)
MCH: 29.2 pg (ref 26.0–34.0)
MCHC: 33.4 g/dL (ref 30.0–36.0)
MCV: 87.4 fL (ref 78.0–100.0)
Monocytes Absolute: 0.7 10*3/uL (ref 0.1–1.0)
Monocytes Relative: 7 %
Neutro Abs: 4.4 10*3/uL (ref 1.7–7.7)
Neutrophils Relative %: 42 %
Platelets: 262 10*3/uL (ref 150–400)
RBC: 5.14 MIL/uL — ABNORMAL HIGH (ref 3.87–5.11)
RDW: 13.1 % (ref 11.5–15.5)
WBC: 10.5 10*3/uL (ref 4.0–10.5)

## 2016-08-29 LAB — I-STAT TROPONIN, ED: Troponin i, poc: 0.02 ng/mL (ref 0.00–0.08)

## 2016-08-29 LAB — BASIC METABOLIC PANEL
Anion gap: 10 (ref 5–15)
BUN: 9 mg/dL (ref 6–20)
CO2: 23 mmol/L (ref 22–32)
Calcium: 9.3 mg/dL (ref 8.9–10.3)
Chloride: 106 mmol/L (ref 101–111)
Creatinine, Ser: 0.67 mg/dL (ref 0.44–1.00)
GFR calc Af Amer: >60 mL/min (ref >60)
GFR calc non Af Amer: >60 mL/min (ref >60)
Glucose, Bld: 85 mg/dL (ref 65–99)
Potassium: 3.2 mmol/L — ABNORMAL LOW (ref 3.5–5.1)
Sodium: 139 mmol/L (ref 135–145)

## 2016-08-29 MED ORDER — POTASSIUM CHLORIDE ER 10 MEQ PO TBCR
10.0000 meq | EXTENDED_RELEASE_TABLET | Freq: Every day | ORAL | Status: DC
  Administered 2016-08-29: 10 meq via ORAL
  Filled 2016-08-29: qty 1

## 2016-08-29 MED ORDER — RANOLAZINE ER 500 MG PO TB12: 500.0000 mg | ORAL_TABLET | Freq: Two times a day (BID) | ORAL | 0 refills | Status: DC

## 2016-08-29 MED ORDER — ONDANSETRON HCL 4 MG/2ML IJ SOLN
4.0000 mg | Freq: Once | INTRAMUSCULAR | Status: AC
  Administered 2016-08-29: 4 mg via INTRAVENOUS
  Filled 2016-08-29: qty 2

## 2016-08-29 MED ORDER — RANOLAZINE ER 500 MG PO TB12
500.0000 mg | ORAL_TABLET | Freq: Once | ORAL | Status: AC
  Administered 2016-08-29: 500 mg via ORAL
  Filled 2016-08-29: qty 1

## 2016-08-29 MED ORDER — POTASSIUM CHLORIDE ER 10 MEQ PO TBCR: 10.0000 meq | EXTENDED_RELEASE_TABLET | Freq: Every day | ORAL | 0 refills | Status: DC

## 2016-08-29 NOTE — ED Notes (Signed)
Patient transported to X-ray 

## 2016-08-29 NOTE — ED Notes (Signed)
[facility] arrived to transport patient. 

## 2016-08-29 NOTE — Discharge Instructions (Signed)
You have an appointment with your cardiologist on Tuesday. Please keep this scheduled appointment.  Take your potassium pill once daily. Take Ranexa every twelve hours.  Return to ER for new or worsening symptoms, any additional concerns.

## 2016-08-29 NOTE — ED Triage Notes (Signed)
Pt arrives from home via [facility]. Pt was seen by the community paramedic today when she complained to her about CP and SOB (pt states she constantly has CP and that isn't new for her). Paramedic gave her a nitro which dropped her BP and she called 911. Upon EMS arrival pt BP was 99/65. Pt was given 324mg  of ASA, 150 NS pta and her BP is now stable. Pt had a cardiac stent placed last week.

## 2016-08-29 NOTE — ED Notes (Signed)
Pt departed in care of [facility] and in NAD.  

## 2016-08-29 NOTE — ED Notes (Signed)
[employer] called. Spoke with Brett Canales and asked if he wold open up the patients apartment so she can be d/c. He states he will go and open the apartment. [facility] has been called.

## 2016-08-29 NOTE — ED Provider Notes (Signed)
[facility]-EMER[facility]ENCY DEPT Provider Note   CSN: [id number] Arrival date & time: 08/29/16  1420     History   Chief Complaint Chief Complaint  Patient presents with  . Shortness of Breath    HPI Carla [name] is a 65 y.o. female.  The history is provided by the patient and medical records. No language interpreter was used.     Carla [name] is a 65 y.o. female  with a PMH of CAD s/p stent placement last week who presents to the Emergency Department complaining of chest pain described as a pressure like something sitting on her chest which began this morning. As stated symptoms include shortness of breath and nausea. Se tried using her Symbicort inhaler with little relief. She then decided to go to the community paramedic crew she was given a nitroglycerin. Per EMS, blood pressure dropped to 2 nitroglycerin and was 99/65 and EMS was called. She was given 324 aspirin in route. Upon arrival blood pressure was 148/76. He states that the nitroglycerin made her feel queasy, but is not sure that helped with her chest pain. She later stated that her chest pain feels improved, so questionable whether nitroglycerin aided in chest pain relief or not. Pain initially 7/10 and now 2/10 but not chest pain free. Denies fevers, abdominal pain, back pain. Followed by cardiology, Dr. Algie Coffer and was seen in clinic yesterday where all was going well.    Past Medical History:  Diagnosis Date  . Anginal pain (HCC)   . Anxiety   . Arthritis    "qwhere" (08/19/2016)  . Chronic bronchitis (HCC)   . Chronic knee pain   . Chronic lower back pain   . COPD (chronic obstructive pulmonary disease) (HCC)   . 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 Dizziness   . Frequent falls   . GERD (gastroesophageal reflux disease)   . Hypertension   . Memory difficulty 2015  . Migraine    "4-5/year" (08/19/2016)  . Myocardial infarction 08/18/2016  . Neuropathy (HCC)   . Pain management   . Pneumonia "several times"    . Pre-diabetes   . 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 Shortness of breath dyspnea    with exertion  . 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 Weakness of both legs     Patient Active Problem List   Diagnosis Date Noted  . Acute coronary syndrome (HCC) 08/19/2016    Class: Acute  . Contusion, multiple sites 06/01/2016  . Degenerative spondylolisthesis 05/09/2016  . Intractable nausea and vomiting 03/10/2016  . Abdominal pain 03/10/2016  . Abnormal thyroid function test 03/08/2016  . Chest pain, unspecified 03/07/2016  . Epigastric abdominal pain 03/07/2016  . Cholecystitis, acute 03/07/2016  . Chronic pain syndrome 03/07/2016  . Prediabetes 03/07/2016  . Obesity, morbid (HCC) 03/07/2016  . Elevated troponin 12/21/2015  . Fall 12/21/2015  . 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 Erythrocytosis   . Essential hypertension 06/29/2015  . 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 Anxiety attack 03/08/2012  . COPD (chronic obstructive pulmonary disease) (HCC) 03/05/2012    Past Surgical History:  Procedure Laterality Date  . APPENDECTOMY  1971  . BACK SURGERY    . CARDIAC CATHETERIZATION N/A 08/19/2016   Procedure: Left Heart Cath and Coronary Angiography;  Surgeon: Orpah Cobb, MD;  Location: [facility] IN[facility];  Service: Cardiovascular;  Laterality: N/A;  . CARDIAC CATHETERIZATION N/A 08/19/2016  Procedure: Coronary Stent Intervention;  Surgeon: Yates Decamp, MD;  Location: [facility] INVASIVE CV LAB;  Service: Cardiovascular;  Laterality: N/A;  . CHOLECYSTECTOMY N/A 03/12/2016   Procedure: LAPAROSCOPIC CHOLECYSTECTOMY;  Surgeon: Ancil Linsey, MD;  Location: [facility] ORS;  Service: General;  Laterality: N/A;  . CORONARY ANGIOPLASTY WITH STENT PLACEMENT  08/19/2016  . KNEE ARTHROSCOPY Right 2007  . LUMBAR FUSION  05/2016   L4-L5 decompression and  fusion   . TONSILLECTOMY  1971    OB History    Gravida Para Term Preterm AB Living   2 2 2          SAB TAB Ectopic Multiple Live Births                   Home Medications    Prior to Admission medications   Medication Sig Start Date End Date Taking? Authorizing Provider  albuterol (PROVENTIL HFA;VENTOLIN HFA) 108 (90 BASE) MCG/ACT inhaler Inhale 2 puffs into the lungs every 6 (six) hours as needed for wheezing. 06/21/13   Nimish Normajean Glasgow, MD  amLODipine (NORVASC) 2.5 MG tablet Take 2 tablets (5 mg total) by mouth daily. 06/30/15   Otis Brace, MD  aspirin EC 81 MG tablet Take 81 mg by mouth daily.    Historical Provider, MD  atorvastatin (LIPITOR) 80 MG tablet Take 1 tablet (80 mg total) by mouth daily at 6 PM. 08/21/16   Orpah Cobb, MD  Cholecalciferol (VITAMIN D) 2000 units CAPS Take 2,000 Units by mouth daily.     Historical Provider, MD  cyclobenzaprine (FLEXERIL) 10 MG tablet Take 10 mg by mouth 3 (three) times daily.     Historical Provider, MD  diazepam (VALIUM) 5 MG tablet Take 1 tablet (5 mg total) by mouth 3 (three) times daily as needed for anxiety. 05/13/16   Julio Sicks, MD  dimenhyDRINATE (DRAMAMINE) 50 MG tablet Take 50 mg by mouth every 8 (eight) hours as needed for dizziness.    Historical Provider, MD  diphenhydrAMINE (BENADRYL) 25 MG tablet Take 25-50 mg by mouth daily as needed for itching or allergies.    Historical Provider, MD  DULoxetine (CYMBALTA) 60 MG capsule Take 60 mg by mouth daily.     Historical Provider, MD  EVZIO 0.4 MG/0.4ML SOAJ Inject 0.4 mLs into the muscle once as needed (for Anaphylaxis/allergic reaction).  02/19/15   Historical Provider, MD  gabapentin (NEURONTIN) 100 MG capsule Take 200 mg by mouth 2 (two) times daily.     Historical Provider, MD  hydrOXYzine (VISTARIL) 25 MG capsule Take 25 mg by mouth at bedtime.    Historical Provider, MD  lisinopril (PRINIVIL,ZESTRIL) 5 MG tablet Take 1 tablet (5 mg total) by mouth daily. 08/21/16   Orpah Cobb, MD  Magnesium Hydroxide (MILK OF MAGNESIA PO) Give 30 ml by mouth as needed for constipation    Historical Provider, MD  metoprolol succinate (TOPROL-XL) 50 MG 24 hr tablet Take 50 mg by mouth at bedtime. 12/14/15   Historical Provider, MD  nitroGLYCERIN (NITROSTAT) 0.4 MG SL tablet Place 1 tablet (0.4 mg total) under the tongue every 5 (five) minutes x 3 doses as needed for chest pain. 08/21/16   Orpah Cobb, MD  omeprazole (PRILOSEC) 20 MG capsule Take 1 capsule (20 mg total) by mouth daily. 06/30/15   Otis Brace, MD  ondansetron (ZOFRAN ODT) 8 MG disintegrating tablet Take 1 tablet (8 mg total) by mouth every 8 (eight) hours as needed for nausea or vomiting. 03/13/16   Durward Mallard  Memon, MD  oxyCODONE-acetaminophen (PERCOCET) 10-325 MG tablet Take 1 tablet by mouth every 4 (four) hours as needed for pain. Max APAP 3gm/24 hrs from all sources 06/05/16   Tiffany L Reed, DO  potassium chloride (K-DUR) 10 MEQ tablet Take 1 tablet (10 mEq total) by mouth daily. 08/29/16   Chase Picket Rylen Hou, PA-C  ranolazine (RANEXA) 500 MG 12 hr tablet Take 1 tablet (500 mg total) by mouth every 12 (twelve) hours. 08/29/16   Hommer Cunliffe Pilcher Valery Amedee, PA-C  ticagrelor (BRILINTA) 90 MG TABS tablet Take 1 tablet (90 mg total) by mouth 2 (two) times daily. 08/21/16   Orpah Cobb, MD    Family History Family History  Problem Relation Age of Onset  . Heart failure Father   . Diabetes Father   . Kidney failure Father     Social History Social History  Substance Use Topics  . Smoking status: Current Every Day Smoker    Packs/day: 0.12    Years: 35.00    Types: Cigarettes    Start date: 08/05/1970  . Smokeless tobacco: Never Used  . Alcohol use 0.0 oz/week     Comment: 08/19/2016 "sip on NYE"     Allergies   Bee venom; Coconut flavor; Mushroom extract complex; Amitriptyline; Toradol [ketorolac tromethamine]; Tramadol; and Trazodone and nefazodone   Review of Systems Review of Systems  Constitutional:  Positive for diaphoresis. Negative for fever.  HENT: Negative for congestion.   Eyes: Negative for visual disturbance.  Respiratory: Negative for cough and shortness of breath.   Cardiovascular: Positive for chest pain. Negative for palpitations and leg swelling.  Gastrointestinal: Positive for nausea. Negative for abdominal pain and vomiting.  Genitourinary: Negative for dysuria.  Musculoskeletal: Negative for back pain and neck pain.  Skin: Negative for color change.  Neurological: Negative for headaches.     Physical Exam Updated Vital Signs BP 167/86   Pulse (!) 56   Temp 97.6 F (36.4 C) (Oral)   Resp 19   SpO2 100%   Physical Exam  Constitutional: She is oriented to person, place, and time. She appears well-developed and well-nourished. No distress.  HENT:  Head: Normocephalic and atraumatic.  Cardiovascular: Normal rate, regular rhythm and normal heart sounds.   No murmur heard. Pulmonary/Chest: Effort normal and breath sounds normal. No respiratory distress. She has no wheezes. She has no rales. She exhibits tenderness.  Abdominal: Soft. She exhibits no distension. There is no tenderness.  Musculoskeletal: She exhibits no edema.  Neurological: She is alert and oriented to person, place, and time.  Skin: Skin is warm and dry.  Nursing note and vitals reviewed.    ED Treatments / Results  Labs (all labs ordered are listed, but only abnormal results are displayed) Labs Reviewed  CBC WITH DIFFERENTIAL/PLATELET - Abnormal; Notable for the following:       Result Value   RBC 5.14 (*)    Lymphs Abs 5.2 (*)    All other components within normal limits  BASIC METABOLIC PANEL - Abnormal; Notable for the following:    Potassium 3.2 (*)    All other components within normal limits  I-STAT TROPOININ, ED    EKG  EKG Interpretation  Date/Time:  Friday August 29 2016 14:22:26 EST Ventricular Rate:  49 PR Interval:    QRS Duration: 87 QT Interval:  509 QTC  Calculation: 460 R Axis:   64 Text Interpretation:  Sinus bradycardia Abnormal T, consider ischemia, diffuse leads improved t wave inversions Confirmed by MESNER MD, JASON (  [id number]) on 08/29/2016 2:34:31 PM Also confirmed by Juleen ChinaKOHUT  MD, STEPHEN [phone]([id number])  on 08/29/2016 3:43:54 PM       Radiology Dg Chest 2 View  Result Date: 08/29/2016 CLINICAL DATA:  Shortness of breath and chest pain for a couple of months. EXAM: CHEST  2 VIEW COMPARISON:  PA and lateral chest 06/27/2016 and 04/27/2015. FINDINGS: The lungs are clear. Heart size is normal. There is no pneumothorax or pleural effusion. Aortic atherosclerosis is noted. No focal bony abnormality. IMPRESSION: No acute disease. Atherosclerosis. Electronically Signed   By: Drusilla Kannerhomas  Dalessio M.D.   On: 08/29/2016 15:16    Procedures Procedures (including critical care time)  Medications Ordered in ED Medications  ondansetron (ZOFRAN) injection 4 mg (not administered)  potassium chloride (K-DUR) CR tablet 10 mEq (not administered)  ranolazine (RANEXA) 12 hr tablet 500 mg (not administered)     Initial Impression / Assessment and Plan / ED Course  I have reviewed the triage vital signs and the nursing notes.  Pertinent labs & imaging results that were available during my care of the patient were reviewed by me and considered in my medical decision making (see chart for details).  Clinical Course    Nathaniel ManSherry M Keisling is a 65 y.o. female who presents to ED for chest pain that began this morning. Of note, patient is followed by Cardiology, Dr. Algie CofferKadakia. She had a left heart cath with stent placement on 11/07. Patient hemodynamically stable with reassuring heart and lung exam. EKG reviewed with attending and appears improved when compared to prior tracing. Troponin 0.02. K+  3.2. Given recent procedure and concerning history will consult cardiology.  4:32 PM - Dr. Algie CofferKadakia at the bedside.    Per cardiology recommendations, patient safe for discharge  home and has a follow up appointment with Dr. Algie CofferKadakia on Tuesday. Will start patient on Ranexa as well as potassium supplementation (10meq qd).   Patient discussed with Dr. Juleen ChinaKohut who agrees with treatment plan.   Final Clinical Impressions(s) / ED Diagnoses   Final diagnoses:  None    New Prescriptions New Prescriptions   POTASSIUM CHLORIDE (K-DUR) 10 MEQ TABLET    Take 1 tablet (10 mEq total) by mouth daily.   RANOLAZINE (RANEXA) 500 MG 12 HR TABLET    Take 1 tablet (500 mg total) by mouth every 12 (twelve) hours.     [facility] & S Surgical HospitalJaime Pilcher Blasa Raisch, PA-C 08/29/16 1801    Raeford RazorStephen Kohut, MD 09/08/16 1131

## 2016-08-29 NOTE — Consult Note (Signed)
Referring Physician:  KEREN ALVERIO is an 65 y.o. female.                       Chief Complaint: Chest pain   HPI: Patient had chest pain and shortness of breath following an episode of back pain and knee pain. Patient has recent stent placed in proximal LAD but mid and distal LAD and diagonal artery have diffuse disease and are narrow vessel less than 1.5 mm in diameter.  Past Medical History:  Diagnosis Date  . Anginal pain ([place])   . Anxiety   . Arthritis    "qwhere" (08/19/2016)  . Chronic bronchitis ([place])   . Chronic knee pain   . Chronic lower back pain   . COPD (chronic obstructive pulmonary disease) ([place])   . 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 Dizziness   . Frequent falls   . GERD (gastroesophageal reflux disease)   . Hypertension   . Memory difficulty 2015  . Migraine    "4-5/year" (08/19/2016)  . Myocardial infarction 08/18/2016  . Neuropathy ([place])   . Pain management   . Pneumonia "several times"  . Pre-diabetes   . 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 Shortness of breath dyspnea    with exertion  . 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 Weakness of both legs       Past Surgical History:  Procedure Laterality Date  . APPENDECTOMY  1971  . BACK SURGERY    . CARDIAC CATHETERIZATION N/A 08/19/2016   Procedure: Left Heart Cath and Coronary Angiography;  Surgeon: Dixie Dials, MD;  Location: [place] CV LAB;  Service: Cardiovascular;  Laterality: N/A;  . CARDIAC CATHETERIZATION N/A 08/19/2016   Procedure: Coronary Stent Intervention;  Surgeon: Adrian Prows, MD;  Location: [place] CV LAB;  Service: Cardiovascular;  Laterality: N/A;  . CHOLECYSTECTOMY N/A 03/12/2016   Procedure: LAPAROSCOPIC CHOLECYSTECTOMY;  Surgeon: Vickie Epley, MD;  Location: [facility] ORS;  Service: General;  Laterality: N/A;  . CORONARY ANGIOPLASTY WITH STENT PLACEMENT  08/19/2016  .  KNEE ARTHROSCOPY Right 2007  . LUMBAR FUSION  05/2016   L4-L5 decompression and fusion   . TONSILLECTOMY  1971    Family History  Problem Relation Age of Onset  . Heart failure Father   . Diabetes Father   . Kidney failure Father    Social History:  reports that she has been smoking Cigarettes.  She started smoking about 46 years ago. She has a 4.20 pack-year smoking history. She has never used smokeless tobacco. She reports that she drinks alcohol. She reports that she does not use drugs.  Allergies:  Allergies  Allergen Reactions  . Bee Venom Anaphylaxis  . Coconut Flavor Anaphylaxis    Per patient  . Mushroom Extract Complex Anaphylaxis    Extreme sweating, vomiting  . Amitriptyline Other (See Comments)    Has nightmares when using, not in right state of mind   . Toradol [Ketorolac Tromethamine] Nausea And Vomiting  . Tramadol Nausea And Vomiting  . Trazodone And Nefazodone Nausea And Vomiting     (Not in a hospital admission)  Results for orders placed or performed during the hospital encounter of 08/29/16 (from the past 48 hour(s))  CBC with Differential     Status: Abnormal   Collection Time: 08/29/16  2:29 PM  Result Value Ref Range   WBC 10.5 4.0 - 10.5 K/uL  RBC 5.14 (H) 3.87 - 5.11 MIL/uL   Hemoglobin 15.0 12.0 - 15.0 g/dL   HCT 44.9 36.0 - 46.0 %   MCV 87.4 78.0 - 100.0 fL   MCH 29.2 26.0 - 34.0 pg   MCHC 33.4 30.0 - 36.0 g/dL   RDW 13.1 11.5 - 15.5 %   Platelets 262 150 - 400 K/uL   Neutrophils Relative % 42 %   Neutro Abs 4.4 1.7 - 7.7 K/uL   Lymphocytes Relative 50 %   Lymphs Abs 5.2 (H) 0.7 - 4.0 K/uL   Monocytes Relative 7 %   Monocytes Absolute 0.7 0.1 - 1.0 K/uL   Eosinophils Relative 1 %   Eosinophils Absolute 0.1 0.0 - 0.7 K/uL   Basophils Relative 0 %   Basophils Absolute 0.0 0.0 - 0.1 K/uL  Basic metabolic panel     Status: Abnormal   Collection Time: 08/29/16  2:29 PM  Result Value Ref Range   Sodium 139 135 - 145 mmol/L   Potassium  3.2 (L) 3.5 - 5.1 mmol/L   Chloride 106 101 - 111 mmol/L   CO2 23 22 - 32 mmol/L   Glucose, Bld 85 65 - 99 mg/dL   BUN 9 6 - 20 mg/dL   Creatinine, Ser 0.67 0.44 - 1.00 mg/dL   Calcium 9.3 8.9 - 10.3 mg/dL   GFR calc non Af Amer >60 >60 mL/min   GFR calc Af Amer >60 >60 mL/min    Comment: (NOTE) The eGFR has been calculated using the CKD EPI equation. This calculation has not been validated in all clinical situations. eGFR's persistently <60 mL/min signify possible Chronic Kidney Disease.    Anion gap 10 5 - 15  I-Stat Troponin, ED (not at [facility])     Status: None   Collection Time: 08/29/16  2:58 PM  Result Value Ref Range   Troponin i, poc 0.02 0.00 - 0.08 ng/mL   Comment 3            Comment: Due to the release kinetics of cTnI, a negative result within the first hours of the onset of symptoms does not rule out myocardial infarction with certainty. If myocardial infarction is still suspected, repeat the test at appropriate intervals.    Dg Chest 2 View  Result Date: 08/29/2016 CLINICAL DATA:  Shortness of breath and chest pain for a couple of months. EXAM: CHEST  2 VIEW COMPARISON:  PA and lateral chest 06/27/2016 and 04/27/2015. FINDINGS: The lungs are clear. Heart size is normal. There is no pneumothorax or pleural effusion. Aortic atherosclerosis is noted. No focal bony abnormality. IMPRESSION: No acute disease. Atherosclerosis. Electronically Signed   By: Inge Rise M.D.   On: 08/29/2016 15:16    Review Of Systems Constitutional: No fever, chills , weight loss or gain. Eyes: No vision change, Wears glasses, contact lens or catararact surgery. Ears: No hearing loss, No tinnitus. Respiratory: No asthma, Positive COPD and shortness of breath. No hemoptysis. Cardiovascular: Recurrent chest pain, no palpitation or leg edema. Gastrointestinal: No nausea, vomiting or diarrhea or constipation. No GI bleed. No hepatitis. Genitourinary: No dysuria, hematuria or kidney stone.  No incontinance. Neurological: No headache, stroke or seizures.  Psychiatry: No psych facility admission for anxiety, depression or suicide. No detox. Skin: No rash. Musculoskeletal: No joint pain or fibromyalgia. Positive back pain. Lymphadenopathy: No lymphadenopathy Hematology: No anemia or easy bruising.   Blood pressure 167/86, pulse (!) 56, temperature 97.6 F (36.4 C), temperature source Oral, resp. rate 19,  SpO2 100 %. There is no height or weight on file to calculate BMI. General appearance: alert, cooperative, appears stated age and mild distress Head: Normocephalic, atraumatic Eyes: Pink conjunctivae/corneas clear. PERRL, EOM's intact.  Neck: no adenopathy, no carotid bruit, no JVD, supple, symmetrical, trachea midline and thyroid not enlarged. Resp: clear to auscultation bilaterally. Mild wheezing on forced expiration. Chest wall: left precordial tenderness. Cardio: regular rate and rhythm, S1, S2 normal, no murmur, click, rub or gallop GI: soft, non-tender; bowel sounds normal; no masses,  no organomegaly Extremities: Bilateral knee swelling, positive tenderness. No cyanosis. Skin: Warm and dry. No rashes or lesions Neurologic: Alert and oriented X 3, normal strength and tone. Slow gait  Assessment/Plan Chest pain S/P LAD stent Multivessel native vessel CAD COPD Essential hypertension Spinal stenosis, limbar region with neurogenic claudication Obesity Right knee and left knee pain Gait abnormality Hypokalemia  Add Ranolazine 500 mg. twice daily. Potassium 10 meq. daily.  Birdie Riddle, MD  08/29/2016, 5:10 PM

## 2016-09-19 IMAGING — CT CT HEAD W/O CM
4 of 5 series · 14 of 47 positions shown, 15 images · non-contrast
Comparison: CT 06/28/2015

CLINICAL DATA: Fell and hit head.  Headache.

EXAM:
CT HEAD WITHOUT CONTRAST
CT CERVICAL SPINE WITHOUT CONTRAST
TECHNIQUE: Multidetector CT imaging of the head and cervical spine was
performed following the standard protocol without intravenous
contrast. Multiplanar CT image reconstructions of the cervical spine
were also generated.

[Series 2: headseq 4.8 h37s · axial · 0.47mm/px · z∈[+176,+236]mm · 2 of 36 slices shown, 3 images]
[im 12/36  brain]
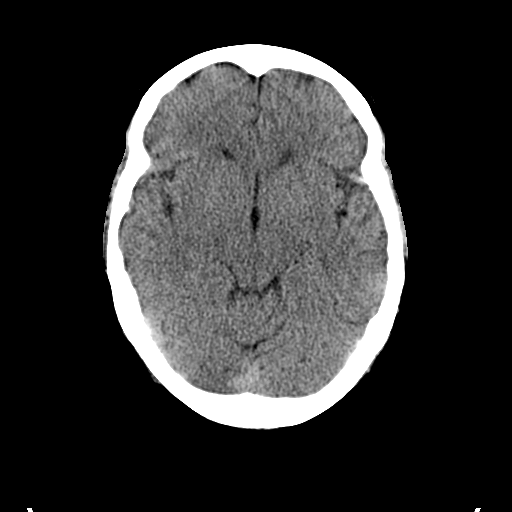
[im 12/36  bone]
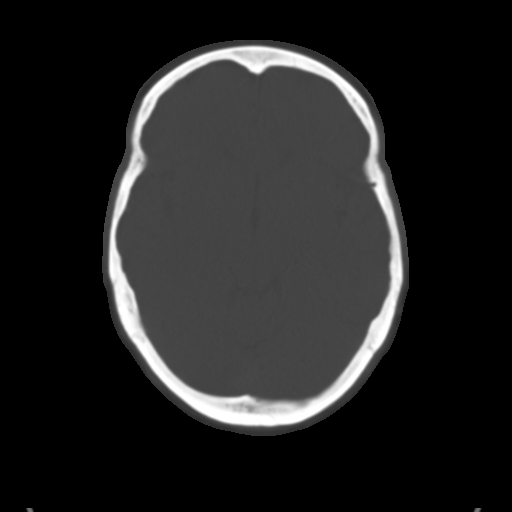
[im 24/36  brain]
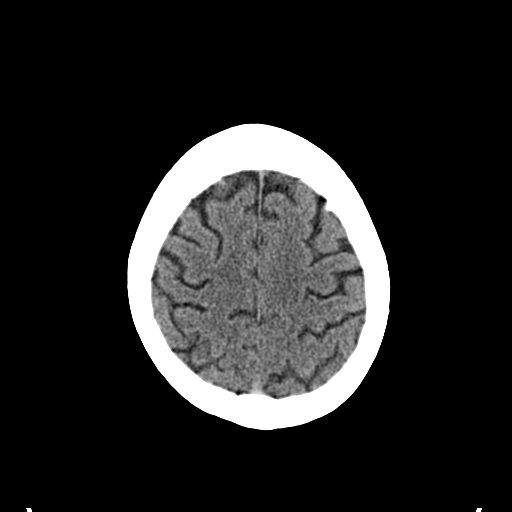

[Series 7: sagittal bone 2.0 · sagittal · 0.28mm/px · 3 of 60 slices shown]
[im 20/60  brain]
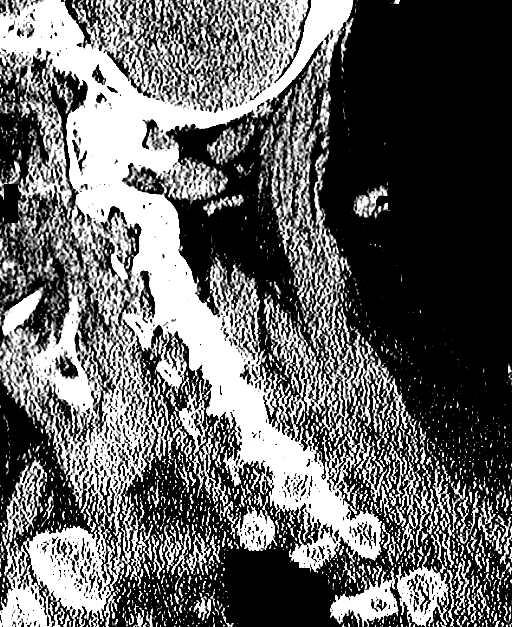
[im 30/60  brain]
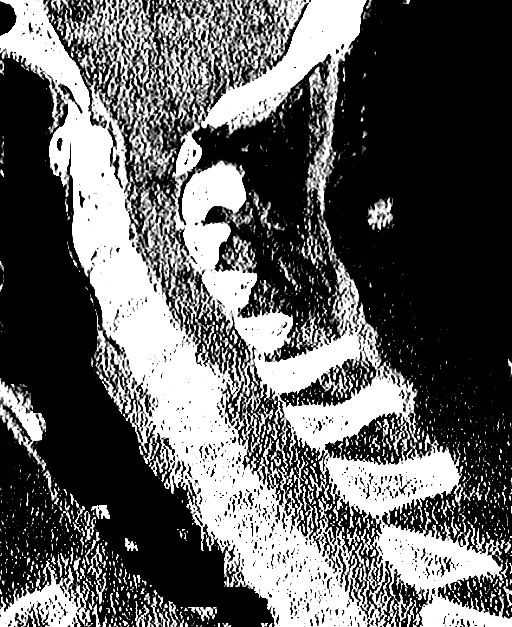
[im 40/60  brain]
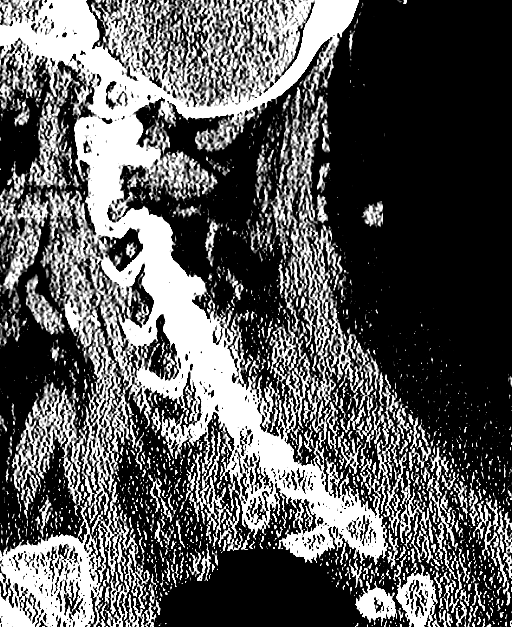

[Series 8: coronal bone 2.0 · coronal · 0.22mm/px · 3 of 54 slices shown]
[im 18/54  brain]
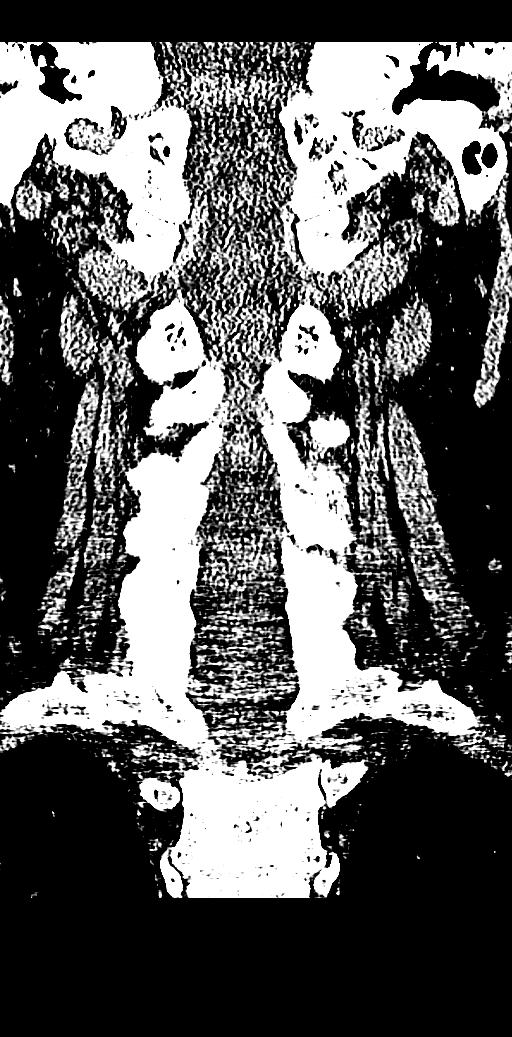
[im 24/54  brain]
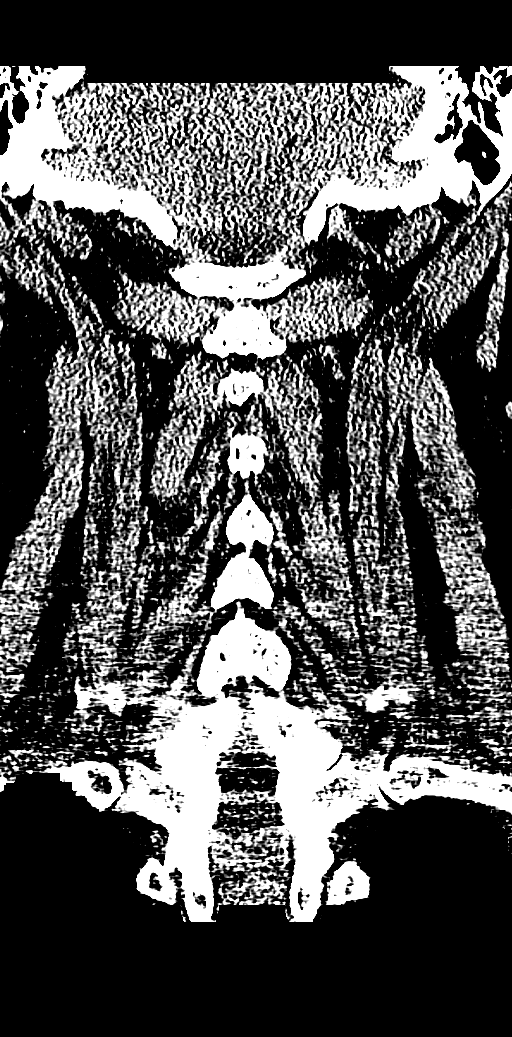
[im 30/54  brain]
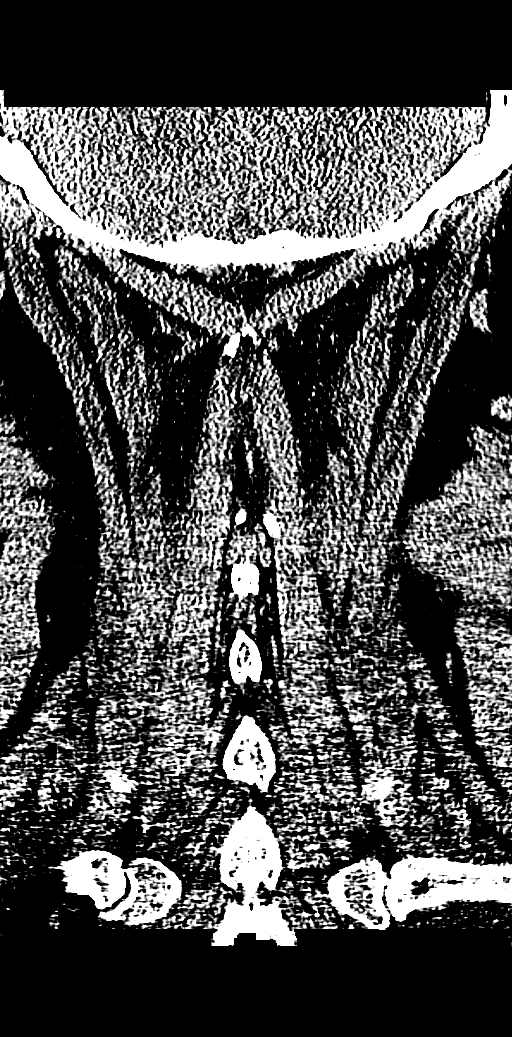

[Series 9: axial bone 2.0 · axial · 0.22mm/px · z∈[-68,+50]mm · 6 of 107 slices shown]
[im 9/107  bone]
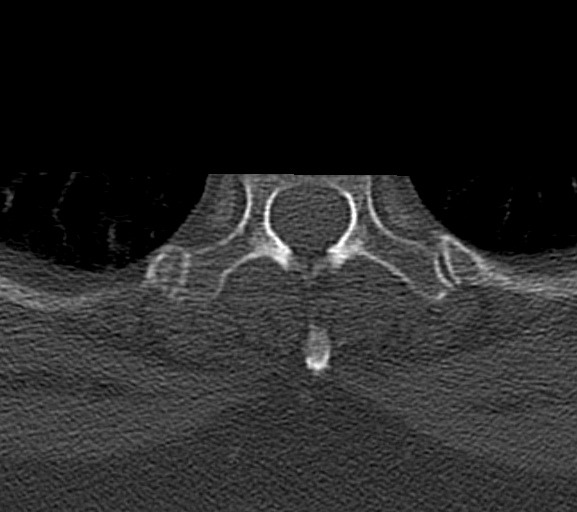
[im 25/107  bone]
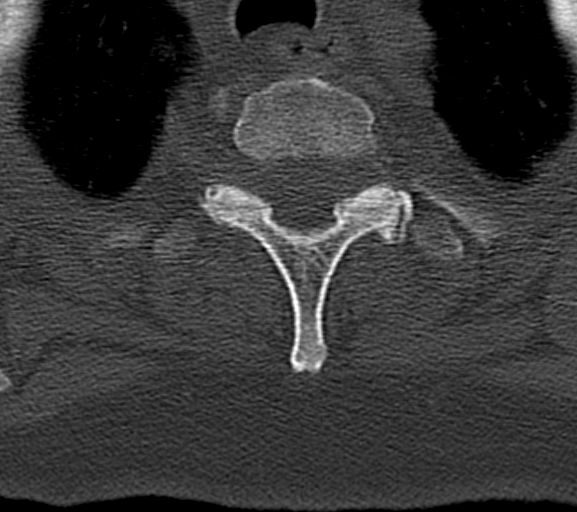
[im 33/107  bone]
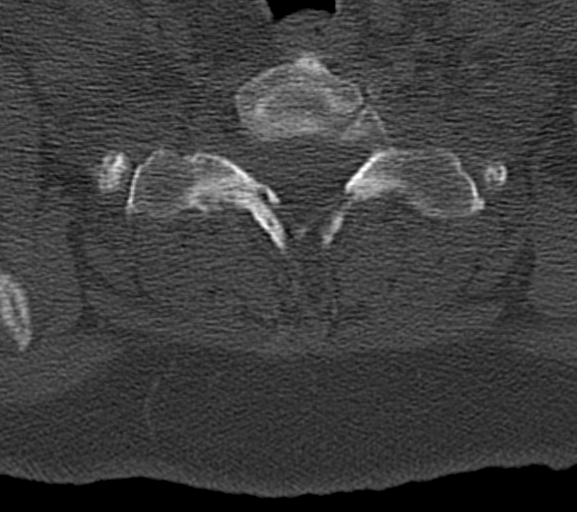
[im 49/107  bone]
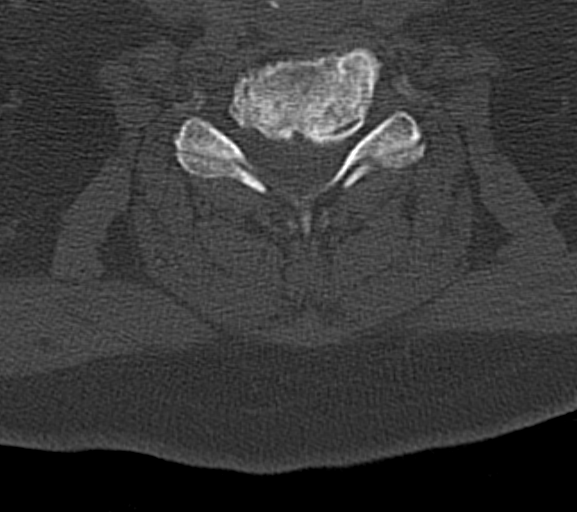
[im 58/107  bone]
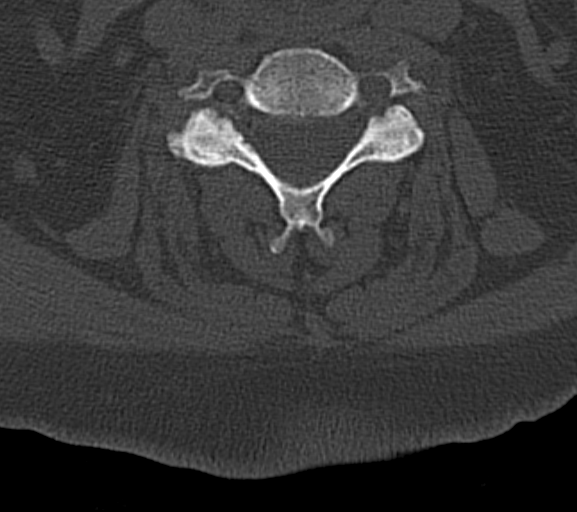
[im 74/107  bone]
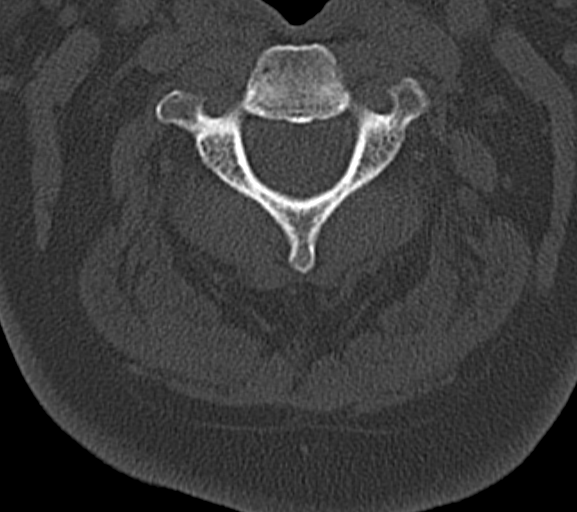

[14 of 47 positions shown; findings below may reference images not displayed]

FINDINGS: CT HEAD FINDINGS

Ventricle size normal. No acute infarct. Negative for intracranial
hemorrhage. Negative for mass or edema.

Negative for skull fracture. Chronic bony thickening left maxillary
sinus due to chronic sinusitis. No air-fluid level.

CT CERVICAL SPINE FINDINGS

Straightening of the cervical lordosis. Normal alignment. Disc
degeneration and spondylosis at C5-6. Left foraminal narrowing due
to spurring at C5-6.

Negative for fracture
IMPRESSION: No acute intracranial abnormality

Cervical spondylosis.  Negative for cervical spine fracture

## 2017-04-07 ENCOUNTER — Emergency Department (HOSPITAL_COMMUNITY)
Admission: EM | Admit: 2017-04-07 | Discharge: 2017-04-07 | Disposition: A | Discharge status: Home / Self Care | Payer: Medicare Other | Attending: Emergency Medicine | Admitting: Emergency Medicine

## 2017-04-07 ENCOUNTER — Encounter (HOSPITAL_COMMUNITY): Payer: Self-pay | Admitting: Emergency Medicine

## 2017-04-07 ENCOUNTER — Emergency Department (HOSPITAL_COMMUNITY): Payer: Medicare Other

## 2017-04-07 DIAGNOSIS — Z955 Presence of coronary angioplasty implant and graft: Secondary | ICD-10-CM | POA: Diagnosis not present

## 2017-04-07 DIAGNOSIS — J449 Chronic obstructive pulmonary disease, unspecified: Secondary | ICD-10-CM | POA: Insufficient documentation

## 2017-04-07 DIAGNOSIS — I10 Essential (primary) hypertension: Secondary | ICD-10-CM | POA: Insufficient documentation

## 2017-04-07 DIAGNOSIS — Z7982 Long term (current) use of aspirin: Secondary | ICD-10-CM | POA: Insufficient documentation

## 2017-04-07 DIAGNOSIS — F17210 Nicotine dependence, cigarettes, uncomplicated: Secondary | ICD-10-CM | POA: Diagnosis not present

## 2017-04-07 DIAGNOSIS — R079 Chest pain, unspecified: Secondary | ICD-10-CM | POA: Diagnosis present

## 2017-04-07 DIAGNOSIS — J0100 Acute maxillary sinusitis, unspecified: Secondary | ICD-10-CM

## 2017-04-07 DIAGNOSIS — Z79899 Other long term (current) drug therapy: Secondary | ICD-10-CM | POA: Diagnosis not present

## 2017-04-07 LAB — BASIC METABOLIC PANEL
Anion gap: 11 (ref 5–15)
BUN: 16 mg/dL (ref 6–20)
CHLORIDE: 103 mmol/L (ref 101–111)
CO2: 26 mmol/L (ref 22–32)
CREATININE: 0.76 mg/dL (ref 0.44–1.00)
Calcium: 9.2 mg/dL (ref 8.9–10.3)
GFR calc Af Amer: >60 mL/min (ref >60)
GLUCOSE: 100 mg/dL — AB (ref 65–99)
Potassium: 4.1 mmol/L (ref 3.5–5.1)
SODIUM: 140 mmol/L (ref 135–145)

## 2017-04-07 LAB — TROPONIN I: Troponin I: <0.03 ng/mL (ref <0.03)

## 2017-04-07 LAB — CBC
HCT: 42.5 % (ref 36.0–46.0)
Hemoglobin: 13.9 g/dL (ref 12.0–15.0)
MCH: 30.2 pg (ref 26.0–34.0)
MCHC: 32.7 g/dL (ref 30.0–36.0)
MCV: 92.2 fL (ref 78.0–100.0)
PLATELETS: 261 10*3/uL (ref 150–400)
RBC: 4.61 MIL/uL (ref 3.87–5.11)
RDW: 15.7 % — AB (ref 11.5–15.5)
WBC: 12.3 10*3/uL — AB (ref 4.0–10.5)

## 2017-04-07 MED ORDER — ACETAMINOPHEN 500 MG PO TABS
1000.0000 mg | ORAL_TABLET | Freq: Once | ORAL | Status: AC
  Administered 2017-04-07: 1000 mg via ORAL
  Filled 2017-04-07: qty 2

## 2017-04-07 MED ORDER — DOXYCYCLINE HYCLATE 100 MG PO CAPS: 100.0000 mg | ORAL_CAPSULE | Freq: Two times a day (BID) | ORAL | 0 refills | Status: DC

## 2017-04-07 MED ORDER — LORATADINE 10 MG PO TABS
10.0000 mg | ORAL_TABLET | Freq: Every day | ORAL | Status: DC
  Administered 2017-04-07: 10 mg via ORAL
  Filled 2017-04-07: qty 1

## 2017-04-07 MED ORDER — LORATADINE 10 MG PO TABS: 10.0000 mg | ORAL_TABLET | Freq: Every day | ORAL | 0 refills | Status: DC

## 2017-04-07 MED ORDER — ACETAMINOPHEN 325 MG PO TABS: 650.0000 mg | ORAL_TABLET | Freq: Four times a day (QID) | ORAL | 0 refills | Status: DC | PRN

## 2017-04-07 MED ORDER — FLUTICASONE PROPIONATE 50 MCG/ACT NA SUSP
2.0000 | Freq: Every day | NASAL | Status: DC
  Administered 2017-04-07: 2 via NASAL
  Filled 2017-04-07: qty 16

## 2017-04-07 MED ORDER — DOXYCYCLINE HYCLATE 100 MG PO TABS
100.0000 mg | ORAL_TABLET | Freq: Once | ORAL | Status: AC
  Administered 2017-04-07: 100 mg via ORAL
  Filled 2017-04-07: qty 1

## 2017-04-07 NOTE — ED Notes (Addendum)
EDP made aware of heart rate. No orders given.

## 2017-04-07 NOTE — ED Notes (Signed)
EKG sent to Dr. Adriana Simas

## 2017-04-07 NOTE — ED Triage Notes (Signed)
Pt reports chest pain starting last night with shortness of breath.

## 2017-04-07 NOTE — ED Provider Notes (Signed)
[facility] DEPT Provider Note   CSN: [id number] Arrival date & time: 04/07/17  1239     History   Chief Complaint Chief Complaint  Patient presents with  . Chest Pain    HPI Carla [name] is a 66 y.o. female.   Chest Pain   This is a new problem. The current episode started 1 to 2 hours ago. The problem has been resolved. Associated symptoms include cough and headaches.  URI   This is a recurrent problem. The current episode started more than 1 week ago. The problem has been gradually worsening. There has been no fever. Associated symptoms include chest pain, congestion, ear pain, headaches, plugged ear sensation, rhinorrhea, sinus pain, sneezing, sore throat and cough. She has tried other medications for the symptoms. The treatment provided no relief.    Past Medical History:  Diagnosis Date  . Anginal pain (HCC)   . Anxiety   . Arthritis    "qwhere" (08/19/2016)  . Chronic bronchitis (HCC)   . Chronic knee pain   . Chronic lower back pain   . COPD (chronic obstructive pulmonary disease) (HCC)   . 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 Dizziness   . Frequent falls   . GERD (gastroesophageal reflux disease)   . Hypertension   . Memory difficulty 2015  . Migraine    "4-5/year" (08/19/2016)  . Myocardial infarction (HCC) 08/18/2016  . Neuropathy   . Pain management   . Pneumonia "several times"  . Pre-diabetes   . 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 Shortness of breath dyspnea    with exertion  . 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 Weakness of both legs     Patient Active Problem List   Diagnosis Date Noted  . Acute coronary syndrome (HCC) 08/19/2016    Class: Acute  . Contusion, multiple sites 06/01/2016  . Degenerative spondylolisthesis 05/09/2016  . Intractable nausea and vomiting 03/10/2016  . Abdominal pain 03/10/2016  . Abnormal thyroid  function test 03/08/2016  . Chest pain, unspecified 03/07/2016  . Epigastric abdominal pain 03/07/2016  . Cholecystitis, acute 03/07/2016  . Chronic pain syndrome 03/07/2016  . Prediabetes 03/07/2016  . Obesity, morbid (HCC) 03/07/2016  . Elevated troponin 12/21/2015  . Fall 12/21/2015  . 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 Erythrocytosis   . Essential hypertension 06/29/2015  . 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 Anxiety attack 03/08/2012  . COPD (chronic obstructive pulmonary disease) (HCC) 03/05/2012    Past Surgical History:  Procedure Laterality Date  . APPENDECTOMY  1971  . BACK SURGERY    . CARDIAC CATHETERIZATION N/A 08/19/2016   Procedure: Left Heart Cath and Coronary Angiography;  Surgeon: Orpah Cobb, MD;  Location: [facility] INVASIVE CV LAB;  Service: Cardiovascular;  Laterality: N/A;  . CARDIAC CATHETERIZATION N/A 08/19/2016   Procedure: Coronary Stent Intervention;  Surgeon: Yates Decamp, MD;  Location: [facility] INVASIVE CV LAB;  Service: Cardiovascular;  Laterality: N/A;  . CHOLECYSTECTOMY N/A 03/12/2016   Procedure: LAPAROSCOPIC CHOLECYSTECTOMY;  Surgeon: Ancil Linsey, MD;  Location: [facility] ORS;  Service: General;  Laterality: N/A;  . CORONARY ANGIOPLASTY WITH STENT PLACEMENT  08/19/2016  . KNEE ARTHROSCOPY Right 2007  . LUMBAR FUSION  05/2016   L4-L5 decompression and fusion   . TONSILLECTOMY  1971    OB History    Gravida Para Term Preterm AB Living   2 2 2  SAB TAB Ectopic Multiple Live Births                   Home Medications    Prior to Admission medications   Medication Sig Start Date End Date Taking? Authorizing Provider  albuterol (PROVENTIL HFA;VENTOLIN HFA) 108 (90 BASE) MCG/ACT inhaler Inhale 2 puffs into the lungs every 6 (six) hours as needed for wheezing. 06/21/13  Yes Gosrani, Nimish C, MD  aspirin EC 81 MG tablet Take 81 mg by mouth daily.   Yes [provider]  atorvastatin (LIPITOR) 80 MG  tablet Take 1 tablet (80 mg total) by mouth daily at 6 PM. 08/21/16  Yes Orpah Cobb, MD  Cholecalciferol (VITAMIN D) 2000 units CAPS Take 2,000 Units by mouth daily.    Yes [provider]  diazepam (VALIUM) 5 MG tablet Take 1 tablet (5 mg total) by mouth 3 (three) times daily as needed for anxiety. 05/13/16  Yes Pool, Sherilyn Cooter, MD  dimenhyDRINATE (DRAMAMINE) 50 MG tablet Take 50 mg by mouth every 8 (eight) hours as needed for dizziness.   Yes [provider]  gabapentin (NEURONTIN) 100 MG capsule Take 600 mg by mouth 3 (three) times daily.    Yes [provider]  lisinopril (PRINIVIL,ZESTRIL) 5 MG tablet Take 1 tablet (5 mg total) by mouth daily. 08/21/16  Yes Orpah Cobb, MD  metoprolol succinate (TOPROL-XL) 50 MG 24 hr tablet Take 25 mg by mouth 2 (two) times daily.  12/14/15  Yes [provider]  nitroGLYCERIN (NITROSTAT) 0.4 MG SL tablet Place 1 tablet (0.4 mg total) under the tongue every 5 (five) minutes x 3 doses as needed for chest pain. 08/21/16  Yes Orpah Cobb, MD  omeprazole (PRILOSEC) 20 MG capsule Take 1 capsule (20 mg total) by mouth daily. 06/30/15  Yes Rabbani, Glendora Score, MD  oxyCODONE-acetaminophen (PERCOCET) 10-325 MG tablet Take 1 tablet by mouth every 4 (four) hours as needed for pain. Max APAP 3gm/24 hrs from all sources Patient taking differently: Take 1 tablet by mouth 4 (four) times daily. Max APAP 3gm/24 hrs from all sources 06/05/16  Yes Reed, Tiffany L, DO  potassium chloride (K-DUR) 10 MEQ tablet Take 1 tablet (10 mEq total) by mouth daily. 08/29/16  Yes Ward, Chase Picket, PA-C  ticagrelor (BRILINTA) 90 MG TABS tablet Take 1 tablet (90 mg total) by mouth 2 (two) times daily. 08/21/16  Yes Orpah Cobb, MD  acetaminophen (TYLENOL) 325 MG tablet Take 2 tablets (650 mg total) by mouth every 6 (six) hours as needed. 04/07/17   Shalom Ware, Barbara Cower, MD  budesonide-formoterol (SYMBICORT) 160-4.5 MCG/ACT inhaler Inhale 2 puffs into the lungs 2 (two) times  daily.    [provider]  cyclobenzaprine (FLEXERIL) 10 MG tablet Take 10 mg by mouth 3 (three) times daily.     [provider]  doxycycline (VIBRAMYCIN) 100 MG capsule Take 1 capsule (100 mg total) by mouth 2 (two) times daily. One po bid x 7 days 04/07/17   Jehu Mccauslin, Barbara Cower, MD  DULoxetine (CYMBALTA) 60 MG capsule Take 60 mg by mouth daily.     [provider]  EVZIO 0.4 MG/0.4ML SOAJ Inject 0.4 mLs into the muscle once as needed (for Anaphylaxis/allergic reaction).  02/19/15   [provider]  hydrOXYzine (VISTARIL) 25 MG capsule Take 25 mg by mouth at bedtime.    [provider]  loratadine (CLARITIN) 10 MG tablet Take 1 tablet (10 mg total) by mouth daily. One po daily x 5 days 04/07/17  Gracey Tolle, Barbara Cower, MD  ondansetron (ZOFRAN ODT) 8 MG disintegrating tablet Take 1 tablet (8 mg total) by mouth every 8 (eight) hours as needed for nausea or vomiting. Patient not taking: Reported on 04/07/2017 03/13/16   Erick Blinks, MD  ranolazine (RANEXA) 500 MG 12 hr tablet Take 1 tablet (500 mg total) by mouth every 12 (twelve) hours. Patient not taking: Reported on 04/07/2017 08/29/16   Ward, Chase Picket, PA-C    Family History Family History  Problem Relation Age of Onset  . Heart failure Father   . Diabetes Father   . Kidney failure Father     Social History Social History  Substance Use Topics  . Smoking status: Current Every Day Smoker    Packs/day: 0.12    Years: 35.00    Types: Cigarettes    Start date: 08/05/1970  . Smokeless tobacco: Never Used  . Alcohol use 0.0 oz/week     Comment: 08/19/2016 "sip on NYE"     Allergies   Bee venom; Coconut flavor; Mushroom extract complex; Amitriptyline; Toradol [ketorolac tromethamine]; Tramadol; and Trazodone and nefazodone   Review of Systems Review of Systems  HENT: Positive for congestion, ear pain, rhinorrhea, sinus pain, sneezing and sore throat.   Respiratory: Positive for cough.     Cardiovascular: Positive for chest pain.  Neurological: Positive for headaches.  All other systems reviewed and are negative.    Physical Exam Updated Vital Signs BP (!) 122/52 (BP Location: Left Arm)   Pulse (!) 46   Temp 98 F (36.7 C) (Oral)   Resp 18   Ht 5\' 4"  (1.626 m)   Wt 90.7 kg (200 lb)   SpO2 99%   BMI 34.33 kg/m   Physical Exam  Constitutional: She appears well-developed and well-nourished.  HENT:  Head: Normocephalic and atraumatic.  Eyes: Conjunctivae and EOM are normal.  Neck: Normal range of motion.  Cardiovascular: Normal rate and regular rhythm.   Pulmonary/Chest: No stridor. No respiratory distress.  Abdominal: Soft. She exhibits no distension.  Neurological: She is alert.  Skin: Skin is warm.  Nursing note and vitals reviewed.    ED Treatments / Results  Labs (all labs ordered are listed, but only abnormal results are displayed) Labs Reviewed  BASIC METABOLIC PANEL - Abnormal; Notable for the following:       Result Value   Glucose, Bld 100 (*)    All other components within normal limits  CBC - Abnormal; Notable for the following:    WBC 12.3 (*)    RDW 15.7 (*)    All other components within normal limits  TROPONIN I    EKG  EKG Interpretation  Date/Time:  Tuesday April 07 2017 12:44:12 EDT Ventricular Rate:  50 PR Interval:  116 QRS Duration: 86 QT Interval:  454 QTC Calculation: 413 R Axis:   43 Text Interpretation:  Sinus bradycardia Otherwise normal ECG previously inverted t waves are now upright Confirmed by Marily Memos [phone]) on 04/07/2017 1:40:26 PM       Radiology Dg Chest 2 View  Result Date: 04/07/2017 CLINICAL DATA:  Vomiting, diarrhea, cough, and chest congestion associated with shortness of breath and mid chest pain. History of asthma -COPD, current smoker. Coronary artery disease with stent placement. EXAM: CHEST  2 VIEW COMPARISON:  Chest x-ray of August 29, 2016 FINDINGS: The lungs are adequately inflated.  There is no focal infiltrate. The interstitial markings are coarse though stable. The heart and pulmonary vascularity are normal. There is calcification in  the wall of the aortic arch. There is no pleural effusion. The bony thorax is unremarkable. IMPRESSION: Mild chronic bronchitic -smoking related changes, stable. No acute cardiopulmonary abnormality. Thoracic aortic atherosclerosis. Electronically Signed   By: David  [place] M.D.   On: 04/07/2017 13:09    Procedures Procedures (including critical care time)  Medications Ordered in ED Medications  fluticasone (FLONASE) 50 MCG/ACT nasal spray 2 spray (2 sprays Each Nare Given 04/07/17 1430)  loratadine (CLARITIN) tablet 10 mg (10 mg Oral Given 04/07/17 1429)  doxycycline (VIBRA-TABS) tablet 100 mg (100 mg Oral Given 04/07/17 1428)  acetaminophen (TYLENOL) tablet 1,000 mg (1,000 mg Oral Given 04/07/17 1428)     Initial Impression / Assessment and Plan / ED Course  I have reviewed the triage vital signs and the nursing notes.  Pertinent labs & imaging results that were available during my care of the patient were reviewed by me and considered in my medical decision making (see chart for details).    Primary complaint is upper respiratory symptoms and had an episode of sharp left sided chest pain earlier today after 2 weeks of coughing and runny nose and sinus pain. CP   HR of 44, h/o same. On metoprolol for BP control. Is asymptomatic (no sob, cp, syncope, light headedness, weakness). Will defer to PCP/cardiologist for further metoprolol control.   Improved symptoms with meds here. Will continue same at home w/ pcp/cards follow up as above. Discussed this with patient and stable for dc at this time.   Final Clinical Impressions(s) / ED Diagnoses   Final diagnoses:  Acute maxillary sinusitis, recurrence not specified    New Prescriptions Discharge Medication List as of 04/07/2017  3:20 PM    START taking these medications   Details    acetaminophen (TYLENOL) 325 MG tablet Take 2 tablets (650 mg total) by mouth every 6 (six) hours as needed., Starting Tue 04/07/2017, Print    doxycycline (VIBRAMYCIN) 100 MG capsule Take 1 capsule (100 mg total) by mouth 2 (two) times daily. One po bid x 7 days, Starting Tue 04/07/2017, Print    loratadine (CLARITIN) 10 MG tablet Take 1 tablet (10 mg total) by mouth daily. One po daily x 5 days, Starting Tue 04/07/2017, Print         Niah Heinle, Barbara Cower, MD 04/07/17 [phone]

## 2017-04-09 ENCOUNTER — Emergency Department (HOSPITAL_COMMUNITY)
Admission: EM | Admit: 2017-04-09 | Discharge: 2017-04-09 | Disposition: A | Discharge status: Home / Self Care | Payer: Medicare Other | Attending: Emergency Medicine | Admitting: Emergency Medicine

## 2017-04-09 ENCOUNTER — Encounter (HOSPITAL_COMMUNITY): Payer: Self-pay | Admitting: Cardiology

## 2017-04-09 ENCOUNTER — Emergency Department (HOSPITAL_COMMUNITY): Payer: Medicare Other

## 2017-04-09 DIAGNOSIS — J449 Chronic obstructive pulmonary disease, unspecified: Secondary | ICD-10-CM | POA: Diagnosis not present

## 2017-04-09 DIAGNOSIS — F17210 Nicotine dependence, cigarettes, uncomplicated: Secondary | ICD-10-CM | POA: Diagnosis not present

## 2017-04-09 DIAGNOSIS — K0889 Other specified disorders of teeth and supporting structures: Secondary | ICD-10-CM | POA: Diagnosis present

## 2017-04-09 DIAGNOSIS — I10 Essential (primary) hypertension: Secondary | ICD-10-CM | POA: Diagnosis not present

## 2017-04-09 DIAGNOSIS — K047 Periapical abscess without sinus: Secondary | ICD-10-CM | POA: Diagnosis not present

## 2017-04-09 DIAGNOSIS — Z7982 Long term (current) use of aspirin: Secondary | ICD-10-CM | POA: Diagnosis not present

## 2017-04-09 LAB — BASIC METABOLIC PANEL
ANION GAP: 8 (ref 5–15)
BUN: 12 mg/dL (ref 6–20)
CALCIUM: 9.2 mg/dL (ref 8.9–10.3)
CO2: 29 mmol/L (ref 22–32)
Chloride: 102 mmol/L (ref 101–111)
Creatinine, Ser: 0.62 mg/dL (ref 0.44–1.00)
Glucose, Bld: 110 mg/dL — ABNORMAL HIGH (ref 65–99)
POTASSIUM: 4.1 mmol/L (ref 3.5–5.1)
Sodium: 139 mmol/L (ref 135–145)

## 2017-04-09 LAB — CBC
HEMATOCRIT: 42.3 % (ref 36.0–46.0)
Hemoglobin: 14.1 g/dL (ref 12.0–15.0)
MCH: 30.1 pg (ref 26.0–34.0)
MCHC: 33.3 g/dL (ref 30.0–36.0)
MCV: 90.2 fL (ref 78.0–100.0)
PLATELETS: 252 10*3/uL (ref 150–400)
RBC: 4.69 MIL/uL (ref 3.87–5.11)
RDW: 15.3 % (ref 11.5–15.5)
WBC: 13.6 10*3/uL — AB (ref 4.0–10.5)

## 2017-04-09 MED ORDER — IOPAMIDOL (ISOVUE-300) INJECTION 61%
75.0000 mL | Freq: Once | INTRAVENOUS | Status: AC | PRN
  Administered 2017-04-09: 75 mL via INTRAVENOUS

## 2017-04-09 MED ORDER — CLINDAMYCIN PHOSPHATE 600 MG/50ML IV SOLN
600.0000 mg | Freq: Once | INTRAVENOUS | Status: AC
  Administered 2017-04-09: 600 mg via INTRAVENOUS
  Filled 2017-04-09: qty 50

## 2017-04-09 MED ORDER — CLINDAMYCIN HCL 300 MG PO CAPS: 300.0000 mg | ORAL_CAPSULE | Freq: Three times a day (TID) | ORAL | 0 refills | Status: AC

## 2017-04-09 MED ORDER — MORPHINE SULFATE (PF) 4 MG/ML IV SOLN
4.0000 mg | Freq: Once | INTRAVENOUS | Status: AC
  Administered 2017-04-09: 4 mg via INTRAVENOUS
  Filled 2017-04-09: qty 1

## 2017-04-09 MED ORDER — CLINDAMYCIN PHOSPHATE 300 MG/50ML IV SOLN: 300.0000 mg | Freq: Once | INTRAVENOUS | Status: DC

## 2017-04-09 NOTE — Discharge Instructions (Signed)
Please read and follow all provided instructions.  Your diagnoses today include:  1. Dental infection     Tests performed today include: Vital signs. See below for your results today.   Medications prescribed:  Take as prescribed   Home care instructions:  Follow any educational materials contained in this packet.  Follow-up instructions: Please follow-up with a dentist for further evaluation of symptoms and treatment   Return instructions:  Please return to the Emergency Department if you do not get better, if you get worse, or new symptoms OR  - Fever (temperature greater than 101.13F)  - Bleeding that does not stop with holding pressure to the area    -Severe pain (please note that you may be more sore the day after your accident)  - Chest Pain  - Difficulty breathing  - Severe nausea or vomiting  - Inability to tolerate food and liquids  - Passing out  - Skin becoming red around your wounds  - Change in mental status (confusion or lethargy)  - New numbness or weakness    Please return if you have any other emergent concerns.  Additional Information:  Your vital signs today were: BP (!) 129/57    Pulse (!) 56    Temp 98.4 F (36.9 C) (Oral)    Resp 16    SpO2 92%  If your blood pressure (BP) was elevated above 135/85 this visit, please have this repeated by your doctor within one month. ---------------

## 2017-04-09 NOTE — ED Provider Notes (Signed)
[facility] DEPT Provider Note   CSN: [id number] Arrival date & time: 04/09/17  0755     History   Chief Complaint Chief Complaint  Patient presents with  . Dental Pain    HPI Carla [name] is a 66 y.o. female.  HPI  66 y.o. female with a hx of COPD, HTN, presents to the Emergency Department today complaining of dental pain x 2 weeks. Seen x 2 days ago in ED for chest pain. Diagnosed with maxillary sinusitis and given Rx Doxycycline. Pt states she took dose in ED and has not taken a dose since being home. States that a car comes by to pick up Rx and get her ABX filled. No one has arrived. Pt does not drive herself. States swelling to right side of face was the size of a grapefruit yesterday. Notes increasing last night. States pain on right side of face with swelling. Noted chills without fever. No N/V. Rates pain 10/10 and worse with opening jaw. Swollen/aching sensation. No CP/SOB/ABD pain. Noted decreased PO intake due to pain. No other symptoms noted.   Past Medical History:  Diagnosis Date  . Anginal pain (HCC)   . Anxiety   . Arthritis    "qwhere" (08/19/2016)  . Chronic bronchitis (HCC)   . Chronic knee pain   . Chronic lower back pain   . COPD (chronic obstructive pulmonary disease) (HCC)   . 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 Dizziness   . Frequent falls   . GERD (gastroesophageal reflux disease)   . Hypertension   . Memory difficulty 2015  . Migraine    "4-5/year" (08/19/2016)  . Myocardial infarction (HCC) 08/18/2016  . Neuropathy   . Pain management   . Pneumonia "several times"  . Pre-diabetes   . 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 Shortness of breath dyspnea    with exertion  . 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 Weakness of both legs     Patient Active Problem List   Diagnosis Date Noted  . Acute coronary syndrome (HCC) 08/19/2016   Class: Acute  . Contusion, multiple sites 06/01/2016  . Degenerative spondylolisthesis 05/09/2016  . Intractable nausea and vomiting 03/10/2016  . Abdominal pain 03/10/2016  . Abnormal thyroid function test 03/08/2016  . Chest pain, unspecified 03/07/2016  . Epigastric abdominal pain 03/07/2016  . Cholecystitis, acute 03/07/2016  . Chronic pain syndrome 03/07/2016  . Prediabetes 03/07/2016  . Obesity, morbid (HCC) 03/07/2016  . Elevated troponin 12/21/2015  . Fall 12/21/2015  . 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 Erythrocytosis   . Essential hypertension 06/29/2015  . 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 Anxiety attack 03/08/2012  . COPD (chronic obstructive pulmonary disease) (HCC) 03/05/2012    Past Surgical History:  Procedure Laterality Date  . APPENDECTOMY  1971  . BACK SURGERY    . CARDIAC CATHETERIZATION N/A 08/19/2016   Procedure: Left Heart Cath and Coronary Angiography;  Surgeon: Orpah Cobb, MD;  Location: [facility] INVASIVE CV LAB;  Service: Cardiovascular;  Laterality: N/A;  . CARDIAC CATHETERIZATION N/A 08/19/2016   Procedure: Coronary Stent Intervention;  Surgeon: Yates Decamp, MD;  Location: [facility] INVASIVE CV LAB;  Service: Cardiovascular;  Laterality: N/A;  . CHOLECYSTECTOMY N/A 03/12/2016   Procedure: LAPAROSCOPIC CHOLECYSTECTOMY;  Surgeon: Ancil Linsey, MD;  Location: [facility] ORS;  Service: General;  Laterality: N/A;  . CORONARY ANGIOPLASTY WITH STENT PLACEMENT  08/19/2016  . KNEE  ARTHROSCOPY Right 2007  . LUMBAR FUSION  05/2016   L4-L5 decompression and fusion   . TONSILLECTOMY  1971    OB History    Gravida Para Term Preterm AB Living   2 2 2          SAB TAB Ectopic Multiple Live Births                   Home Medications    Prior to Admission medications   Medication Sig Start Date End Date Taking? Authorizing Provider  acetaminophen (TYLENOL) 325 MG tablet Take 2 tablets (650 mg total) by mouth every 6 (six)  hours as needed. 04/07/17   Mesner, Barbara Cower, MD  albuterol (PROVENTIL HFA;VENTOLIN HFA) 108 (90 BASE) MCG/ACT inhaler Inhale 2 puffs into the lungs every 6 (six) hours as needed for wheezing. 06/21/13   Wilson Singer, MD  aspirin EC 81 MG tablet Take 81 mg by mouth daily.    [provider]  atorvastatin (LIPITOR) 80 MG tablet Take 1 tablet (80 mg total) by mouth daily at 6 PM. 08/21/16   Orpah Cobb, MD  budesonide-formoterol (SYMBICORT) 160-4.5 MCG/ACT inhaler Inhale 2 puffs into the lungs 2 (two) times daily.    [provider]  Cholecalciferol (VITAMIN D) 2000 units CAPS Take 2,000 Units by mouth daily.     [provider]  cyclobenzaprine (FLEXERIL) 10 MG tablet Take 10 mg by mouth 3 (three) times daily.     [provider]  diazepam (VALIUM) 5 MG tablet Take 1 tablet (5 mg total) by mouth 3 (three) times daily as needed for anxiety. 05/13/16   Julio Sicks, MD  dimenhyDRINATE (DRAMAMINE) 50 MG tablet Take 50 mg by mouth every 8 (eight) hours as needed for dizziness.    [provider]  doxycycline (VIBRAMYCIN) 100 MG capsule Take 1 capsule (100 mg total) by mouth 2 (two) times daily. One po bid x 7 days 04/07/17   Mesner, Barbara Cower, MD  DULoxetine (CYMBALTA) 60 MG capsule Take 60 mg by mouth daily.     [provider]  EVZIO 0.4 MG/0.4ML SOAJ Inject 0.4 mLs into the muscle once as needed (for Anaphylaxis/allergic reaction).  02/19/15   [provider]  gabapentin (NEURONTIN) 100 MG capsule Take 600 mg by mouth 3 (three) times daily.     [provider]  hydrOXYzine (VISTARIL) 25 MG capsule Take 25 mg by mouth at bedtime.    [provider]  lisinopril (PRINIVIL,ZESTRIL) 5 MG tablet Take 1 tablet (5 mg total) by mouth daily. 08/21/16   Orpah Cobb, MD  loratadine (CLARITIN) 10 MG tablet Take 1 tablet (10 mg total) by mouth daily. One po daily x 5 days 04/07/17   Mesner, Barbara Cower, MD  metoprolol succinate (TOPROL-XL) 50 MG 24 hr  tablet Take 25 mg by mouth 2 (two) times daily.  12/14/15   [provider]  nitroGLYCERIN (NITROSTAT) 0.4 MG SL tablet Place 1 tablet (0.4 mg total) under the tongue every 5 (five) minutes x 3 doses as needed for chest pain. 08/21/16   Orpah Cobb, MD  omeprazole (PRILOSEC) 20 MG capsule Take 1 capsule (20 mg total) by mouth daily. 06/30/15   Otis Brace, MD  ondansetron (ZOFRAN ODT) 8 MG disintegrating tablet Take 1 tablet (8 mg total) by mouth every 8 (eight) hours as needed for nausea or vomiting. Patient not taking: Reported on 04/07/2017 03/13/16   Erick Blinks, MD  oxyCODONE-acetaminophen (PERCOCET) 10-325 MG tablet Take 1 tablet by  mouth every 4 (four) hours as needed for pain. Max APAP 3gm/24 hrs from all sources Patient taking differently: Take 1 tablet by mouth 4 (four) times daily. Max APAP 3gm/24 hrs from all sources 06/05/16   Renato Gails, Tiffany L, DO  potassium chloride (K-DUR) 10 MEQ tablet Take 1 tablet (10 mEq total) by mouth daily. 08/29/16   Ward, Chase Picket, PA-C  ranolazine (RANEXA) 500 MG 12 hr tablet Take 1 tablet (500 mg total) by mouth every 12 (twelve) hours. Patient not taking: Reported on 04/07/2017 08/29/16   Ward, Chase Picket, PA-C  ticagrelor (BRILINTA) 90 MG TABS tablet Take 1 tablet (90 mg total) by mouth 2 (two) times daily. 08/21/16   Orpah Cobb, MD    Family History Family History  Problem Relation Age of Onset  . Heart failure Father   . Diabetes Father   . Kidney failure Father     Social History Social History  Substance Use Topics  . Smoking status: Current Every Day Smoker    Packs/day: 0.12    Years: 35.00    Types: Cigarettes    Start date: 08/05/1970  . Smokeless tobacco: Never Used  . Alcohol use 0.0 oz/week     Comment: 08/19/2016 "sip on NYE"     Allergies   Bee venom; Coconut flavor; Mushroom extract complex; Amitriptyline; Toradol [ketorolac tromethamine]; Tramadol; and Trazodone and nefazodone   Review of  Systems Review of Systems ROS reviewed and all are negative for acute change except as noted in the HPI.  Physical Exam Updated Vital Signs BP 106/62 (BP Location: Left Arm)   Pulse (!) 57   Temp 98.4 F (36.9 C) (Oral)   Resp 20   SpO2 98%   Physical Exam  Constitutional: She is oriented to person, place, and time. Vital signs are normal. She appears well-developed and well-nourished. No distress.  HENT:  Head: Normocephalic and atraumatic.  Right Ear: Hearing, tympanic membrane, external ear and ear canal normal.  Left Ear: Hearing, tympanic membrane, external ear and ear canal normal.  Nose: Nose normal.  Mouth/Throat: Uvula is midline, oropharynx is clear and moist and mucous membranes are normal. No trismus in the jaw. No oropharyngeal exudate, posterior oropharyngeal erythema or tonsillar abscesses.  Right lower mandible with facial swelling noted. No erythema. Oral exam shows induration on right lower mandible along gingiva. Poor dentition throughout. No palpable fluctuance. No ludwigs angina palpable on soft tissue. No trimus. Able to open jaw fully, albeit with discomfort. ROM of neck intact without discomfort.     Eyes: Conjunctivae and EOM are normal. Pupils are equal, round, and reactive to light.  Neck: Normal range of motion. Neck supple. No tracheal deviation present.  Cardiovascular: Normal rate, regular rhythm, S1 normal, S2 normal, normal heart sounds, intact distal pulses and normal pulses.   Pulmonary/Chest: Effort normal and breath sounds normal. No respiratory distress. She has no decreased breath sounds. She has no wheezes. She has no rhonchi. She has no rales.  Abdominal: Soft. Normal appearance and bowel sounds are normal. There is no tenderness.  Musculoskeletal: Normal range of motion.  Neurological: She is alert and oriented to person, place, and time.  Skin: Skin is warm and dry.  Psychiatric: She has a normal mood and affect. Her speech is normal and  behavior is normal. Thought content normal.  Nursing note and vitals reviewed.    ED Treatments / Results  Labs (all labs ordered are listed, but only abnormal results are displayed) Labs Reviewed  CBC - Abnormal; Notable for the following:       Result Value   WBC 13.6 (*)    All other components within normal limits  BASIC METABOLIC PANEL - Abnormal; Notable for the following:    Glucose, Bld 110 (*)    All other components within normal limits    EKG  EKG Interpretation None       Radiology Dg Chest 2 View  Result Date: 04/07/2017 CLINICAL DATA:  Vomiting, diarrhea, cough, and chest congestion associated with shortness of breath and mid chest pain. History of asthma -COPD, current smoker. Coronary artery disease with stent placement. EXAM: CHEST  2 VIEW COMPARISON:  Chest x-ray of August 29, 2016 FINDINGS: The lungs are adequately inflated. There is no focal infiltrate. The interstitial markings are coarse though stable. The heart and pulmonary vascularity are normal. There is calcification in the wall of the aortic arch. There is no pleural effusion. The bony thorax is unremarkable. IMPRESSION: Mild chronic bronchitic -smoking related changes, stable. No acute cardiopulmonary abnormality. Thoracic aortic atherosclerosis. Electronically Signed   By: David  [place] M.D.   On: 04/07/2017 13:09   Ct Soft Tissue Neck W Contrast  Result Date: 04/09/2017 CLINICAL DATA:  66 year old female with possible dental abscess right mandible. Pain for 2 weeks. Right facial swelling. Started antibiotics 2 days ago. EXAM: CT NECK WITH CONTRAST TECHNIQUE: Multidetector CT imaging of the neck was performed using the standard protocol following the bolus administration of intravenous contrast. CONTRAST:  75mL ISOVUE-300 IOPAMIDOL (ISOVUE-300) INJECTION 61% COMPARISON:  Prior CT head thin cervical spines 07/08/2016 and earlier. FINDINGS: Pharynx and larynx: Larynx and pharynx soft tissue contours are  within normal limits. Negative parapharyngeal and retropharyngeal spaces. Salivary glands: Soft tissue swelling and stranding surrounding the body of the right mandible. Involvement of the right sublingual space, and secondary involvement of the right sublingual gland as seen on series 2, image 44. Phlegmon type soft tissue changes both anterior and exterior to the mandible. No discrete fluid collection. Bone windows demonstrate abnormal periapical lucency at the right mandible second bicuspid. No visible cortical breakthrough (series 3, image 42), although the bony canal for the right mental nerve is nearby. Widespread dental caries throughout the dentition. The right mandible molars are absent, the right mandible wisdom tooth remains and is carious. Mild secondary inflammation in the right submandibular space. The right submandibular gland is not definitely inflamed. There is asymmetric thickening of the right platysma. Probable anterior right buccal space involvement. The left sublingual and central sublingual spaces, left submandibular space, left submandibular gland, and bilateral parotid glands are within normal limits. Thyroid: Negative. Lymph nodes: Reactive appearing level 1 and right level 3 lymph nodes. No suppurated nodes. Vascular: Major vascular structures in the neck and at the skullbase including the right internal jugular vein are patent. Bilateral carotid bifurcation and proximal ICA atherosclerosis. Partially retropharyngeal course of the right ICA. Limited intracranial: Negative. Visualized orbits: Postoperative changes to the right globe, otherwise negative. Mastoids and visualized paranasal sinuses: Stable chronic left maxillary sinusitis with mucoperiosteal thickening. Other Visualized paranasal sinuses and mastoids are stable and well pneumatized. Skeleton: Dentition findings described above. No mandible fracture or No acute osseous abnormality identified. Cervical spine degeneration. Upper  chest: Normal lung apices. Visualized mediastinum is within normal limits. IMPRESSION: 1. Inflammatory phlegmon surrounding the body of the right mandible with secondarily inflamed right sublingual gland. 2. Odontogenic origin of infection from the right mandible second bicuspid suspected, with superimposed carious dentition. 3. No discrete  subperiosteal or soft tissue abscess at this time. Reactive lymph nodes. No subcutaneous gas or complicating features. 4. No other acute findings. Bilateral carotid atherosclerosis. Chronic left maxillary sinusitis. Electronically Signed   By: Odessa Fleming M.D.   On: 04/09/2017 11:08    Procedures Procedures (including critical care time)  Medications Ordered in ED Medications - No data to display   Initial Impression / Assessment and Plan / ED Course  I have reviewed the triage vital signs and the nursing notes.  Pertinent labs & imaging results that were available during my care of the patient were reviewed by me and considered in my medical decision making (see chart for details).  Final Clinical Impressions(s) / ED Diagnoses  {I have reviewed and evaluated the relevant laboratory values. {I have reviewed and evaluated the relevant imaging studies.  {I have reviewed the relevant previous healthcare records.  {I obtained HPI from historian. {Patient discussed with supervising physician.  ED Course:  Assessment: Pt is a 66 y.o. female with hx COPD, HTN who presents with right sided dental pain x 2 weeks. Noted swelling this AM. No fevers. No N/V. Decrease PO intake due to pain with opening jaw. On exam, pt in NAD. Nontoxic/nonseptic appearing. VSS. Afebrile. Lungs CTA. Heart RRR. Abdomen nontender soft. Right lower mandible with facial swelling noted. No erythema. Oral exam shows induration on right lower mandible along gingiva. Poor dentition throughout. No palpable fluctuance. No ludwigs angina palpable on soft tissue. No trimus. Able to open jaw fully, albeit  with discomfort. ROM of neck intact without discomfort. CBC/BMP unremarkable. Concern for possible dental abscess. Given Clindamycin in ED as well as analgesia. CT Sfot Tissue neck shows no dental abscess. Dental infection noted. Seen by supervising physician.  Plan is to DC home with Clindamycin ABX. DC Doxy. I gave patient referral to dentist and stressed the importance of dental follow up for ultimate management of dental pain. Patient voices understanding and is agreeable to plan. At time of discharge, Patient is in no acute distress. Vital Signs are stable. Patient is able to ambulate. Patient able to tolerate PO.   Review of [place] Drug Abuse Database shows most recent Rx Percocet 10s on 01-22-17 with #105 supply for 27 days.   Disposition/Plan:  DC Home Additional Verbal discharge instructions given and discussed with patient.  Pt Instructed to f/u with Dentistry in the next week for evaluation and treatment of symptoms. Return precautions given Pt acknowledges and agrees with plan  Supervising Physician Bethann Berkshire, MD  Final diagnoses:  Dental infection    New Prescriptions New Prescriptions   No medications on file     Wilber Bihari 04/09/17 [id number]    Bethann Berkshire, MD 04/10/17 1012

## 2017-04-09 NOTE — ED Triage Notes (Signed)
Dental pain times 2 weeks.  Started antibiodic 2 days ago.  Swelling right side of face.

## 2017-05-25 ENCOUNTER — Observation Stay (HOSPITAL_COMMUNITY)
Admission: AD | Admit: 2017-05-25 | Discharge: 2017-05-26 | Disposition: A | Discharge status: Home / Self Care | Payer: Medicare Other | Source: Ambulatory Visit | Attending: Cardiovascular Disease | Admitting: Cardiovascular Disease

## 2017-05-25 ENCOUNTER — Observation Stay (HOSPITAL_COMMUNITY): Payer: Medicare Other

## 2017-05-25 ENCOUNTER — Encounter (HOSPITAL_COMMUNITY): Payer: Self-pay | Admitting: General Practice

## 2017-05-25 DIAGNOSIS — R0602 Shortness of breath: Secondary | ICD-10-CM | POA: Diagnosis not present

## 2017-05-25 DIAGNOSIS — Z955 Presence of coronary angioplasty implant and graft: Secondary | ICD-10-CM | POA: Insufficient documentation

## 2017-05-25 DIAGNOSIS — E669 Obesity, unspecified: Secondary | ICD-10-CM | POA: Insufficient documentation

## 2017-05-25 DIAGNOSIS — Z981 Arthrodesis status: Secondary | ICD-10-CM | POA: Insufficient documentation

## 2017-05-25 DIAGNOSIS — M48062 Spinal stenosis, lumbar region with neurogenic claudication: Secondary | ICD-10-CM | POA: Diagnosis not present

## 2017-05-25 DIAGNOSIS — Z91030 Bee allergy status: Secondary | ICD-10-CM | POA: Insufficient documentation

## 2017-05-25 DIAGNOSIS — G8929 Other chronic pain: Secondary | ICD-10-CM | POA: Insufficient documentation

## 2017-05-25 DIAGNOSIS — R072 Precordial pain: Principal | ICD-10-CM | POA: Diagnosis present

## 2017-05-25 DIAGNOSIS — F329 Major depressive disorder, single episode, unspecified: Secondary | ICD-10-CM | POA: Diagnosis not present

## 2017-05-25 DIAGNOSIS — Z888 Allergy status to other drugs, medicaments and biological substances status: Secondary | ICD-10-CM | POA: Diagnosis not present

## 2017-05-25 DIAGNOSIS — R600 Localized edema: Secondary | ICD-10-CM | POA: Diagnosis not present

## 2017-05-25 DIAGNOSIS — K219 Gastro-esophageal reflux disease without esophagitis: Secondary | ICD-10-CM | POA: Diagnosis not present

## 2017-05-25 DIAGNOSIS — I10 Essential (primary) hypertension: Secondary | ICD-10-CM | POA: Diagnosis not present

## 2017-05-25 DIAGNOSIS — G43909 Migraine, unspecified, not intractable, without status migrainosus: Secondary | ICD-10-CM | POA: Diagnosis not present

## 2017-05-25 DIAGNOSIS — Z9049 Acquired absence of other specified parts of digestive tract: Secondary | ICD-10-CM | POA: Insufficient documentation

## 2017-05-25 DIAGNOSIS — Z9102 Food additives allergy status: Secondary | ICD-10-CM | POA: Insufficient documentation

## 2017-05-25 DIAGNOSIS — I2510 Atherosclerotic heart disease of native coronary artery without angina pectoris: Secondary | ICD-10-CM | POA: Diagnosis not present

## 2017-05-25 DIAGNOSIS — I700 Atherosclerosis of aorta: Secondary | ICD-10-CM | POA: Diagnosis not present

## 2017-05-25 DIAGNOSIS — M549 Dorsalgia, unspecified: Secondary | ICD-10-CM | POA: Diagnosis not present

## 2017-05-25 DIAGNOSIS — R079 Chest pain, unspecified: Secondary | ICD-10-CM | POA: Diagnosis present

## 2017-05-25 DIAGNOSIS — Z79899 Other long term (current) drug therapy: Secondary | ICD-10-CM | POA: Insufficient documentation

## 2017-05-25 DIAGNOSIS — M170 Bilateral primary osteoarthritis of knee: Secondary | ICD-10-CM | POA: Diagnosis not present

## 2017-05-25 DIAGNOSIS — Z6827 Body mass index (BMI) 27.0-27.9, adult: Secondary | ICD-10-CM | POA: Diagnosis not present

## 2017-05-25 DIAGNOSIS — F419 Anxiety disorder, unspecified: Secondary | ICD-10-CM | POA: Insufficient documentation

## 2017-05-25 DIAGNOSIS — F17210 Nicotine dependence, cigarettes, uncomplicated: Secondary | ICD-10-CM | POA: Diagnosis not present

## 2017-05-25 DIAGNOSIS — Z8249 Family history of ischemic heart disease and other diseases of the circulatory system: Secondary | ICD-10-CM | POA: Insufficient documentation

## 2017-05-25 DIAGNOSIS — Z833 Family history of diabetes mellitus: Secondary | ICD-10-CM | POA: Insufficient documentation

## 2017-05-25 DIAGNOSIS — J449 Chronic obstructive pulmonary disease, unspecified: Secondary | ICD-10-CM | POA: Insufficient documentation

## 2017-05-25 DIAGNOSIS — Z841 Family history of disorders of kidney and ureter: Secondary | ICD-10-CM | POA: Insufficient documentation

## 2017-05-25 DIAGNOSIS — Z7982 Long term (current) use of aspirin: Secondary | ICD-10-CM | POA: Insufficient documentation

## 2017-05-25 DIAGNOSIS — E119 Type 2 diabetes mellitus without complications: Secondary | ICD-10-CM | POA: Insufficient documentation

## 2017-05-25 DIAGNOSIS — I252 Old myocardial infarction: Secondary | ICD-10-CM | POA: Diagnosis not present

## 2017-05-25 DIAGNOSIS — I509 Heart failure, unspecified: Secondary | ICD-10-CM

## 2017-05-25 HISTORY — DX: Unspecified asthma, uncomplicated: J45.909

## 2017-05-25 HISTORY — DX: Headache: R51

## 2017-05-25 HISTORY — DX: Headache, unspecified: R51.9

## 2017-05-25 LAB — CBC WITH DIFFERENTIAL/PLATELET
BASOS ABS: 0 10*3/uL (ref 0.0–0.1)
Basophils Relative: 0 %
EOS PCT: 1 %
Eosinophils Absolute: 0.1 10*3/uL (ref 0.0–0.7)
HEMATOCRIT: 35.6 % — AB (ref 36.0–46.0)
Hemoglobin: 11.8 g/dL — ABNORMAL LOW (ref 12.0–15.0)
LYMPHS ABS: 3.5 10*3/uL (ref 0.7–4.0)
LYMPHS PCT: 42 %
MCH: 29.9 pg (ref 26.0–34.0)
MCHC: 33.1 g/dL (ref 30.0–36.0)
MCV: 90.1 fL (ref 78.0–100.0)
Monocytes Absolute: 0.4 10*3/uL (ref 0.1–1.0)
Monocytes Relative: 5 %
NEUTROS ABS: 4.2 10*3/uL (ref 1.7–7.7)
Neutrophils Relative %: 52 %
Platelets: 239 10*3/uL (ref 150–400)
RBC: 3.95 MIL/uL (ref 3.87–5.11)
RDW: 14.5 % (ref 11.5–15.5)
WBC: 8.3 10*3/uL (ref 4.0–10.5)

## 2017-05-25 LAB — COMPREHENSIVE METABOLIC PANEL
ALK PHOS: 90 U/L (ref 38–126)
ALT: 17 U/L (ref 14–54)
AST: 22 U/L (ref 15–41)
Albumin: 3.2 g/dL — ABNORMAL LOW (ref 3.5–5.0)
Anion gap: 8 (ref 5–15)
BILIRUBIN TOTAL: 0.7 mg/dL (ref 0.3–1.2)
BUN: 12 mg/dL (ref 6–20)
CALCIUM: 8.9 mg/dL (ref 8.9–10.3)
CO2: 27 mmol/L (ref 22–32)
CREATININE: 0.57 mg/dL (ref 0.44–1.00)
Chloride: 104 mmol/L (ref 101–111)
Glucose, Bld: 94 mg/dL (ref 65–99)
Potassium: 3.9 mmol/L (ref 3.5–5.1)
Sodium: 139 mmol/L (ref 135–145)
TOTAL PROTEIN: 6 g/dL — AB (ref 6.5–8.1)

## 2017-05-25 LAB — TROPONIN I

## 2017-05-25 LAB — BRAIN NATRIURETIC PEPTIDE: B NATRIURETIC PEPTIDE 5: 126.6 pg/mL — AB (ref 0.0–100.0)

## 2017-05-25 MED ORDER — HEPARIN SODIUM (PORCINE) 5000 UNIT/ML IJ SOLN
5000.0000 [IU] | Freq: Three times a day (TID) | INTRAMUSCULAR | Status: DC
  Administered 2017-05-25 – 2017-05-26 (×4): 5000 [IU] via SUBCUTANEOUS
  Filled 2017-05-25 (×4): qty 1

## 2017-05-25 MED ORDER — LORATADINE 10 MG PO TABS
10.0000 mg | ORAL_TABLET | Freq: Every day | ORAL | Status: DC
  Administered 2017-05-26: 10 mg via ORAL
  Filled 2017-05-25: qty 1

## 2017-05-25 MED ORDER — POTASSIUM CHLORIDE CRYS ER 10 MEQ PO TBCR
10.0000 meq | EXTENDED_RELEASE_TABLET | Freq: Every day | ORAL | Status: DC
  Administered 2017-05-26: 10 meq via ORAL
  Filled 2017-05-25 (×3): qty 1

## 2017-05-25 MED ORDER — ACETAMINOPHEN 325 MG PO TABS: 650.0000 mg | ORAL_TABLET | ORAL | Status: DC | PRN

## 2017-05-25 MED ORDER — TICAGRELOR 90 MG PO TABS
90.0000 mg | ORAL_TABLET | Freq: Two times a day (BID) | ORAL | Status: DC
Start: 2017-05-25 — End: 2017-05-26
  Administered 2017-05-25 – 2017-05-26 (×2): 90 mg via ORAL
  Filled 2017-05-25 (×2): qty 1

## 2017-05-25 MED ORDER — ALBUTEROL SULFATE (2.5 MG/3ML) 0.083% IN NEBU: 2.5000 mg | INHALATION_SOLUTION | Freq: Four times a day (QID) | RESPIRATORY_TRACT | Status: DC | PRN

## 2017-05-25 MED ORDER — DIAZEPAM 5 MG PO TABS: 5.0000 mg | ORAL_TABLET | Freq: Three times a day (TID) | ORAL | Status: DC | PRN

## 2017-05-25 MED ORDER — ONDANSETRON HCL 4 MG/2ML IJ SOLN: 4.0000 mg | Freq: Four times a day (QID) | INTRAMUSCULAR | Status: DC | PRN

## 2017-05-25 MED ORDER — HYDROXYZINE PAMOATE 25 MG PO CAPS
25.0000 mg | ORAL_CAPSULE | Freq: Every day | ORAL | Status: DC
  Filled 2017-05-25: qty 1

## 2017-05-25 MED ORDER — METOPROLOL SUCCINATE ER 25 MG PO TB24
25.0000 mg | ORAL_TABLET | Freq: Two times a day (BID) | ORAL | Status: DC
  Administered 2017-05-25 – 2017-05-26 (×2): 25 mg via ORAL
  Filled 2017-05-25 (×2): qty 1

## 2017-05-25 MED ORDER — SODIUM CHLORIDE 0.9% FLUSH
3.0000 mL | Freq: Two times a day (BID) | INTRAVENOUS | Status: DC
  Administered 2017-05-25 – 2017-05-26 (×3): 3 mL via INTRAVENOUS

## 2017-05-25 MED ORDER — PANTOPRAZOLE SODIUM 40 MG PO TBEC
40.0000 mg | DELAYED_RELEASE_TABLET | Freq: Every day | ORAL | Status: DC
  Administered 2017-05-26: 40 mg via ORAL
  Filled 2017-05-25: qty 1

## 2017-05-25 MED ORDER — DULOXETINE HCL 60 MG PO CPEP
60.0000 mg | ORAL_CAPSULE | Freq: Every day | ORAL | Status: DC
  Administered 2017-05-25 – 2017-05-26 (×2): 60 mg via ORAL
  Filled 2017-05-25 (×2): qty 1

## 2017-05-25 MED ORDER — SODIUM CHLORIDE 0.9% FLUSH: 3.0000 mL | INTRAVENOUS | Status: DC | PRN

## 2017-05-25 MED ORDER — LISINOPRIL 5 MG PO TABS
5.0000 mg | ORAL_TABLET | Freq: Every day | ORAL | Status: DC
Start: 2017-05-26 — End: 2017-05-26
  Administered 2017-05-26: 5 mg via ORAL
  Filled 2017-05-25: qty 1

## 2017-05-25 MED ORDER — OXYCODONE-ACETAMINOPHEN 5-325 MG PO TABS
2.0000 | ORAL_TABLET | Freq: Four times a day (QID) | ORAL | Status: DC | PRN
  Administered 2017-05-25 – 2017-05-26 (×3): 2 via ORAL
  Filled 2017-05-25 (×3): qty 2

## 2017-05-25 MED ORDER — VITAMIN D 1000 UNITS PO TABS
2000.0000 [IU] | ORAL_TABLET | Freq: Every day | ORAL | Status: DC
  Administered 2017-05-25 – 2017-05-26 (×2): 2000 [IU] via ORAL
  Filled 2017-05-25 (×2): qty 2

## 2017-05-25 MED ORDER — MOMETASONE FURO-FORMOTEROL FUM 200-5 MCG/ACT IN AERO
2.0000 | INHALATION_SPRAY | Freq: Two times a day (BID) | RESPIRATORY_TRACT | Status: DC
  Administered 2017-05-25 – 2017-05-26 (×2): 2 via RESPIRATORY_TRACT
  Filled 2017-05-25: qty 8.8

## 2017-05-25 MED ORDER — FUROSEMIDE 10 MG/ML IJ SOLN
40.0000 mg | Freq: Two times a day (BID) | INTRAMUSCULAR | Status: DC
  Administered 2017-05-25 – 2017-05-26 (×3): 40 mg via INTRAVENOUS
  Filled 2017-05-25 (×3): qty 4

## 2017-05-25 MED ORDER — ATORVASTATIN CALCIUM 80 MG PO TABS: 80.0000 mg | ORAL_TABLET | Freq: Every day | ORAL | Status: DC

## 2017-05-25 MED ORDER — SODIUM CHLORIDE 0.9 % IV SOLN: 250.0000 mL | INTRAVENOUS | Status: DC | PRN

## 2017-05-25 MED ORDER — GABAPENTIN 300 MG PO CAPS
600.0000 mg | ORAL_CAPSULE | Freq: Three times a day (TID) | ORAL | Status: DC
  Administered 2017-05-25 – 2017-05-26 (×4): 600 mg via ORAL
  Filled 2017-05-25 (×4): qty 2

## 2017-05-25 NOTE — H&P (Signed)
Referring Physician:  [name] is an 66 y.o. female.                       Chief Complaint: Chest pain and leg edema  HPI: 66 year old female with PMH of hypertension. Diet controlled diabetes, back pain, knee pain has 4 day history of leg edema, shortness of breath and chest pain. No sweating spell but patient in tears off and on. She had LAD stent placed in 08/2016  Past Medical History:  Diagnosis Date  . Anginal pain (HCC)   . Anxiety   . Arthritis    "qwhere" (08/19/2016)  . Chronic bronchitis (HCC)   . Chronic knee pain   . Chronic lower back pain   . COPD (chronic obstructive pulmonary disease) (HCC)   . 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 Dizziness   . Frequent falls   . GERD (gastroesophageal reflux disease)   . Hypertension   . Memory difficulty 2015  . Migraine    "4-5/year" (08/19/2016)  . Myocardial infarction (HCC) 08/18/2016  . Neuropathy   . Pain management   . Pneumonia "several times"  . Pre-diabetes   . 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 Shortness of breath dyspnea    with exertion  . 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 Weakness of both legs       Past Surgical History:  Procedure Laterality Date  . APPENDECTOMY  1971  . BACK SURGERY    . CARDIAC CATHETERIZATION N/A 08/19/2016   Procedure: Left Heart Cath and Coronary Angiography;  Surgeon: Orpah Cobb, MD;  Location: [facility] INVASIVE CV LAB;  Service: Cardiovascular;  Laterality: N/A;  . CARDIAC CATHETERIZATION N/A 08/19/2016   Procedure: Coronary Stent Intervention;  Surgeon: Yates Decamp, MD;  Location: [facility] INVASIVE CV LAB;  Service: Cardiovascular;  Laterality: N/A;  . CHOLECYSTECTOMY N/A 03/12/2016   Procedure: LAPAROSCOPIC CHOLECYSTECTOMY;  Surgeon: Ancil Linsey, MD;  Location: [facility] ORS;  Service: General;  Laterality: N/A;  . CORONARY ANGIOPLASTY WITH STENT PLACEMENT  08/19/2016  .  KNEE ARTHROSCOPY Right 2007  . LUMBAR FUSION  05/2016   L4-L5 decompression and fusion   . TONSILLECTOMY  1971    Family History  Problem Relation Age of Onset  . Heart failure Father   . Diabetes Father   . Kidney failure Father    Social History:  reports that she has been smoking Cigarettes.  She started smoking about 46 years ago. She has a 4.20 pack-year smoking history. She has never used smokeless tobacco. She reports that she drinks alcohol. She reports that she does not use drugs.  Allergies:  Allergies  Allergen Reactions  . Bee Venom Anaphylaxis  . Coconut Flavor Anaphylaxis    Per patient  . Mushroom Extract Complex Anaphylaxis    Extreme sweating, vomiting  . Amitriptyline Other (See Comments)    Has nightmares when using, not in right state of mind   . Toradol [Ketorolac Tromethamine] Nausea And Vomiting  . Tramadol Nausea And Vomiting  . Trazodone And Nefazodone Nausea And Vomiting    Medications Prior to Admission  Medication Sig Dispense Refill  . acetaminophen (TYLENOL) 325 MG tablet Take 2 tablets (650 mg total) by mouth every 6 (six) hours as needed. 30 tablet 0  . albuterol (PROVENTIL HFA;VENTOLIN HFA) 108 (90 BASE) MCG/ACT inhaler Inhale 2 puffs into the lungs every 6 (six) hours as needed  for wheezing. 1 Inhaler 2  . aspirin EC 81 MG tablet Take 81 mg by mouth daily.    Marland Kitchen atorvastatin (LIPITOR) 80 MG tablet Take 1 tablet (80 mg total) by mouth daily at 6 PM. 30 tablet 6  . budesonide-formoterol (SYMBICORT) 160-4.5 MCG/ACT inhaler Inhale 2 puffs into the lungs 2 (two) times daily.    . Cholecalciferol (VITAMIN D) 2000 units CAPS Take 2,000 Units by mouth daily.     . cyclobenzaprine (FLEXERIL) 10 MG tablet Take 10 mg by mouth 3 (three) times daily.     . diazepam (VALIUM) 5 MG tablet Take 1 tablet (5 mg total) by mouth 3 (three) times daily as needed for anxiety. 60 tablet 0  . dimenhyDRINATE (DRAMAMINE) 50 MG tablet Take 50 mg by mouth every 8 (eight)  hours as needed for dizziness.    . DULoxetine (CYMBALTA) 60 MG capsule Take 60 mg by mouth daily.     Marland Kitchen EVZIO 0.4 MG/0.4ML SOAJ Inject 0.4 mLs into the muscle once as needed (for Anaphylaxis/allergic reaction).     . gabapentin (NEURONTIN) 100 MG capsule Take 600 mg by mouth 3 (three) times daily.     . hydrOXYzine (VISTARIL) 25 MG capsule Take 25 mg by mouth at bedtime.    Marland Kitchen lisinopril (PRINIVIL,ZESTRIL) 5 MG tablet Take 1 tablet (5 mg total) by mouth daily. 30 tablet 3  . loratadine (CLARITIN) 10 MG tablet Take 1 tablet (10 mg total) by mouth daily. One po daily x 5 days 5 tablet 0  . metoprolol succinate (TOPROL-XL) 50 MG 24 hr tablet Take 25 mg by mouth 2 (two) times daily.     . nitroGLYCERIN (NITROSTAT) 0.4 MG SL tablet Place 1 tablet (0.4 mg total) under the tongue every 5 (five) minutes x 3 doses as needed for chest pain. 25 tablet 1  . omeprazole (PRILOSEC) 20 MG capsule Take 1 capsule (20 mg total) by mouth daily. 30 capsule 0  . ondansetron (ZOFRAN ODT) 8 MG disintegrating tablet Take 1 tablet (8 mg total) by mouth every 8 (eight) hours as needed for nausea or vomiting. (Patient not taking: Reported on 04/07/2017) 20 tablet 0  . oxyCODONE-acetaminophen (PERCOCET) 10-325 MG tablet Take 1 tablet by mouth every 4 (four) hours as needed for pain. Max APAP 3gm/24 hrs from all sources (Patient taking differently: Take 1 tablet by mouth 4 (four) times daily. Max APAP 3gm/24 hrs from all sources) 180 tablet 0  . potassium chloride (K-DUR) 10 MEQ tablet Take 1 tablet (10 mEq total) by mouth daily. 30 tablet 0  . ranolazine (RANEXA) 500 MG 12 hr tablet Take 1 tablet (500 mg total) by mouth every 12 (twelve) hours. (Patient not taking: Reported on 04/07/2017) 60 tablet 0  . ticagrelor (BRILINTA) 90 MG TABS tablet Take 1 tablet (90 mg total) by mouth 2 (two) times daily. 60 tablet 6  . [DISCONTINUED] doxycycline (VIBRAMYCIN) 100 MG capsule Take 1 capsule (100 mg total) by mouth 2 (two) times daily. One  po bid x 7 days 14 capsule 0    No results found for this or any previous visit (from the past 48 hour(s)). No results found.  Review Of Systems Constitutional: No fever, chills, weight loss or gain. Eyes: No vision change, wears glasses. No discharge or pain. Ears: No hearing loss, No tinnitus. Respiratory: No asthma, positive COPD, pneumonias, shortness of breath. No hemoptysis. Cardiovascular: Positive chest pain, palpitation, leg edema. Gastrointestinal: No nausea, vomiting, diarrhea, constipation. No GI bleed. No hepatitis.  Genitourinary: No dysuria, hematuria, kidney stone. No incontinance. Neurological: No headache, stroke, seizures.  Psychiatry: No psych facility admission for anxiety, depression, suicide. No detox. Skin: No rash. Musculoskeletal: Positive joint pain, fibromyalgia. No neck pain, positive back pain. Lymphadenopathy: No lymphadenopathy. Hematology: No anemia or easy bruising.   Blood pressure (!) 121/49, pulse (!) 56, SpO2 95 %. There is no height or weight on file to calculate BMI. General appearance: alert, cooperative, appears stated age and no distress Head: Normocephalic, atraumatic. Eyes: Brown eyes, pink conjunctiva, corneas clear. PERRL, EOM's intact. Neck: No adenopathy, no carotid bruit, 2 + JVD, supple, symmetrical, trachea midline and thyroid not enlarged. Resp: Clear to auscultation bilaterally. Cardio: Regular rate and rhythm, S1, S2 normal, II/VI systolic murmur, no click, rub or gallop GI: Soft, non-tender; bowel sounds normal; no organomegaly. Extremities: 2 + edema, cyanosis or clubbing. Skin: Warm and dry.  Neurologic: Alert and oriented X 3, normal strength. Walks slowly with a quad cane or walker.  Assessment/Plan Chest pain r/o MI Leg edema and shortness of breath r/o CHF CAD S/P LAD stent Hypertension COPD Obesity  Place in observation. Blood work IV lasix. Nuclear stress test v/s cardiac cath in AM.  Ricki Rodriguez,  MD  05/25/2017, 1:51 PM

## 2017-05-26 ENCOUNTER — Observation Stay (HOSPITAL_COMMUNITY): Payer: Medicare Other

## 2017-05-26 ENCOUNTER — Other Ambulatory Visit: Payer: Self-pay

## 2017-05-26 DIAGNOSIS — R072 Precordial pain: Secondary | ICD-10-CM | POA: Diagnosis not present

## 2017-05-26 DIAGNOSIS — R600 Localized edema: Secondary | ICD-10-CM | POA: Diagnosis not present

## 2017-05-26 DIAGNOSIS — I2510 Atherosclerotic heart disease of native coronary artery without angina pectoris: Secondary | ICD-10-CM | POA: Diagnosis not present

## 2017-05-26 DIAGNOSIS — Z955 Presence of coronary angioplasty implant and graft: Secondary | ICD-10-CM | POA: Diagnosis not present

## 2017-05-26 LAB — TROPONIN I: Troponin I: <0.03 ng/mL (ref <0.03)

## 2017-05-26 LAB — BASIC METABOLIC PANEL
ANION GAP: 9 (ref 5–15)
BUN: 13 mg/dL (ref 6–20)
CO2: 29 mmol/L (ref 22–32)
CREATININE: 0.67 mg/dL (ref 0.44–1.00)
Calcium: 9.1 mg/dL (ref 8.9–10.3)
Chloride: 100 mmol/L — ABNORMAL LOW (ref 101–111)
GFR calc Af Amer: >60 mL/min (ref >60)
GFR calc non Af Amer: >60 mL/min (ref >60)
GLUCOSE: 114 mg/dL — AB (ref 65–99)
POTASSIUM: 3.6 mmol/L (ref 3.5–5.1)
Sodium: 138 mmol/L (ref 135–145)

## 2017-05-26 LAB — HIV ANTIBODY (ROUTINE TESTING W REFLEX): HIV SCREEN 4TH GENERATION: NONREACTIVE

## 2017-05-26 MED ORDER — FUROSEMIDE 40 MG PO TABS: 40.0000 mg | ORAL_TABLET | ORAL | 3 refills | Status: DC

## 2017-05-26 MED ORDER — REGADENOSON 0.4 MG/5ML IV SOLN
0.4000 mg | Freq: Once | INTRAVENOUS | Status: AC
  Administered 2017-05-26: 0.4 mg via INTRAVENOUS
  Filled 2017-05-26: qty 5

## 2017-05-26 MED ORDER — TECHNETIUM TC 99M TETROFOSMIN IV KIT
10.0000 | PACK | Freq: Once | INTRAVENOUS | Status: AC | PRN
  Administered 2017-05-26: 10 via INTRAVENOUS

## 2017-05-26 MED ORDER — TECHNETIUM TC 99M TETROFOSMIN IV KIT
30.0000 | PACK | Freq: Once | INTRAVENOUS | Status: AC | PRN
  Administered 2017-05-26: 30 via INTRAVENOUS

## 2017-05-26 MED ORDER — FUROSEMIDE 40 MG PO TABS: 40.0000 mg | ORAL_TABLET | ORAL | Status: DC

## 2017-05-26 MED ORDER — REGADENOSON 0.4 MG/5ML IV SOLN
INTRAVENOUS | Status: AC
  Filled 2017-05-26: qty 5

## 2017-05-26 MED ORDER — POTASSIUM CHLORIDE ER 10 MEQ PO TBCR: 10.0000 meq | EXTENDED_RELEASE_TABLET | Freq: Two times a day (BID) | ORAL | 0 refills | Status: DC

## 2017-05-26 NOTE — Progress Notes (Signed)
Discharge patient to home, d/c instructions and follow up appointments discussed, verbalized understanding. PIV removed no s/s of infiltration or swelling noted. Son in law to transport  Patient.

## 2017-05-26 NOTE — Discharge Summary (Signed)
Physician Discharge Summary  Patient ID: Carla [name] MRN: [id number] DOB/AGE: 64-Oct-1952 64 y.o.  Admit date: 05/25/2017 Discharge date: 05/26/2017  Admission Diagnoses: Chest pain rule out MI Bilateral leg edema which shortness of breath rule out CHF Coronary artery disease Status post LAD stent Hypertension COPD Obesity  Discharge Diagnoses:  Principal Problem: * Precordial chest pain * Active Problems:   Bilateral leg edema   MI and CHF ruled out   CAD   S/P LAD stent   Essential hypertension   COPD   Obesity   Severe bilateral knee arthritis   Diet controlled diabetes mellitus, II  Discharged Condition: poor  Hospital Course: 66 year old female with past medical history of hypertension, diet-controlled diabetes mellitus, chronic back pain, severe bilateral knee pain had a four-day history of leg edema and shortness of breath and chest pain. She had LAD stent placed in November 2017. Her leg edema improved with the 2 doses of IV Lasix. Her chest x-ray was negative for pulmonary edema and her BNP was marginally elevated at 126 pg. Her troponin I was negative 3.  She underwent a nuclear stress test that showed possible small area of prior infarction in anterior wall without associated regional wall motion abnormality and very mild hypokinesia at the base of the ventricular septum, ejection fraction of 54% with no reversible ischemia. She was given prescription for oral furosemide that she can use once or twice a week as needed for leg edema. She will follow up with primary care physician in one month and see me in 1 week.  Consults: cardiology  Significant Diagnostic Studies: labs: CBC essentially unremarkable. Comprehensive metabolic panel also unremarkable with the exception of total protein of 6 and albumin level of 3.2 g. BNP was marginally elevated at 126 pg. Troponin I was negative 3.  Treatments: cardiac meds: lisinopril (generic), metoprolol, furosemide,  atorvastatin, aspirin and Brilinta.  Discharge Exam: Blood pressure (!) 133/54, pulse 62, temperature 98.7 F (37.1 C), temperature source Oral, resp. rate 14, weight 72.8 kg (160 lb 6.4 oz), SpO2 95 %. General appearance: alert, cooperative and appears stated age. Head: Normocephalic, atraumatic. Eyes: Blue eyes, pink conjunctiva, corneas clear. PERRL, EOM's intact.  Neck: No adenopathy, no carotid bruit, no JVD, supple, symmetrical, trachea midline and thyroid not enlarged. Resp: Clear to auscultation bilaterally. Cardio: Regular rate and rhythm, S1, S2 normal, II/VI systolic murmur, no click, rub or gallop. GI: Soft, non-tender; bowel sounds normal; no organomegaly. Extremities: No edema, cyanosis or clubbing. Severe bilateral knee arthritis. Skin: Warm and dry.  Neurologic: Alert and oriented X 3, normal strength and tone. Slow gait with walker use.  Disposition: 01-Home or Self Care   Allergies as of 05/26/2017      Reactions   Bee Venom Anaphylaxis   Coconut Flavor Anaphylaxis   Per patient   Mushroom Extract Complex Anaphylaxis   Extreme sweating, vomiting   Amitriptyline Other (See Comments)   Has nightmares when using, not in right state of mind    Toradol [ketorolac Tromethamine] Nausea And Vomiting   Tramadol Nausea And Vomiting   Trazodone And Nefazodone Nausea And Vomiting      Medication List    STOP taking these medications   acetaminophen 325 MG tablet Commonly known as:  TYLENOL     TAKE these medications   albuterol 108 (90 Base) MCG/ACT inhaler Commonly known as:  PROVENTIL HFA;VENTOLIN HFA Inhale 2 puffs into the lungs every 6 (six) hours as needed for wheezing.   aspirin EC 81  MG tablet Take 81 mg by mouth daily.   atorvastatin 80 MG tablet Commonly known as:  LIPITOR Take 1 tablet (80 mg total) by mouth daily at 6 PM.   budesonide-formoterol 160-4.5 MCG/ACT inhaler Commonly known as:  SYMBICORT Inhale 2 puffs into the lungs 2 (two) times  daily.   diazepam 5 MG tablet Commonly known as:  VALIUM Take 1 tablet (5 mg total) by mouth 3 (three) times daily as needed for anxiety.   EVZIO 0.4 MG/0.4ML Soaj Generic drug:  Naloxone HCl Inject 0.4 mLs into the muscle once as needed (for Anaphylaxis/allergic reaction).   furosemide 40 MG tablet Commonly known as:  LASIX Take 1 tablet (40 mg total) by mouth every Monday, Wednesday, and Friday. As needed for leg edema.   gabapentin 600 MG tablet Commonly known as:  NEURONTIN Take 600 mg by mouth 3 (three) times daily.   lisinopril 5 MG tablet Commonly known as:  PRINIVIL,ZESTRIL Take 1 tablet (5 mg total) by mouth daily.   metoprolol succinate 50 MG 24 hr tablet Commonly known as:  TOPROL-XL Take 50 mg by mouth daily.   nitroGLYCERIN 0.4 MG SL tablet Commonly known as:  NITROSTAT Place 1 tablet (0.4 mg total) under the tongue every 5 (five) minutes x 3 doses as needed for chest pain.   omeprazole 20 MG capsule Commonly known as:  PRILOSEC Take 1 capsule (20 mg total) by mouth daily.   oxyCODONE-acetaminophen 10-325 MG tablet Commonly known as:  PERCOCET Take 1 tablet by mouth every 4 (four) hours as needed for pain. Max APAP 3gm/24 hrs from all sources What changed:  when to take this  additional instructions   potassium chloride 10 MEQ tablet Commonly known as:  K-DUR Take 1 tablet (10 mEq total) by mouth 2 (two) times daily. What changed:  when to take this   ticagrelor 90 MG Tabs tablet Commonly known as:  BRILINTA Take 1 tablet (90 mg total) by mouth 2 (two) times daily.   Vitamin D 2000 units Caps Take 2,000 Units by mouth daily.      Follow-up Information    Oval Linsey, MD. Schedule an appointment as soon as possible for a visit in 1 month(s).   Specialty:  Internal Medicine Contact information: [address] [place] [address] [phone]        Orpah Cobb, MD. Schedule an appointment as soon as possible for a visit in 1  week(s).   Specialty:  Cardiology Contact information: [address] [place] [address] [phone]           Signed: Ricki Rodriguez 05/26/2017, 2:20 PM
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
## 2017-05-26 NOTE — Care Management Obs Status (Signed)
MEDICARE OBSERVATION STATUS NOTIFICATION   Patient Details  Name: Carla [name] MRN: [id number] Date of Birth: 1951-07-17   Medicare Observation Status Notification Given:  Yes    Elliot Cousin, RN 05/26/2017, 2:43 PM

## 2017-09-23 ENCOUNTER — Emergency Department (HOSPITAL_COMMUNITY): Payer: Medicare Other

## 2017-09-23 ENCOUNTER — Emergency Department (HOSPITAL_COMMUNITY)
Admission: EM | Admit: 2017-09-23 | Discharge: 2017-09-23 | Disposition: A | Discharge status: Home / Self Care | Payer: Medicare Other | Attending: Emergency Medicine | Admitting: Emergency Medicine

## 2017-09-23 ENCOUNTER — Encounter (HOSPITAL_COMMUNITY): Payer: Self-pay | Admitting: Emergency Medicine

## 2017-09-23 DIAGNOSIS — I252 Old myocardial infarction: Secondary | ICD-10-CM | POA: Insufficient documentation

## 2017-09-23 DIAGNOSIS — I10 Essential (primary) hypertension: Secondary | ICD-10-CM | POA: Insufficient documentation

## 2017-09-23 DIAGNOSIS — Z7982 Long term (current) use of aspirin: Secondary | ICD-10-CM | POA: Diagnosis not present

## 2017-09-23 DIAGNOSIS — F17210 Nicotine dependence, cigarettes, uncomplicated: Secondary | ICD-10-CM | POA: Diagnosis not present

## 2017-09-23 DIAGNOSIS — J449 Chronic obstructive pulmonary disease, unspecified: Secondary | ICD-10-CM | POA: Insufficient documentation

## 2017-09-23 DIAGNOSIS — Z9114 Patient's other noncompliance with medication regimen: Secondary | ICD-10-CM | POA: Diagnosis not present

## 2017-09-23 DIAGNOSIS — Z91030 Bee allergy status: Secondary | ICD-10-CM | POA: Diagnosis not present

## 2017-09-23 DIAGNOSIS — J45909 Unspecified asthma, uncomplicated: Secondary | ICD-10-CM | POA: Diagnosis not present

## 2017-09-23 DIAGNOSIS — Z79899 Other long term (current) drug therapy: Secondary | ICD-10-CM | POA: Diagnosis not present

## 2017-09-23 DIAGNOSIS — R4701 Aphasia: Secondary | ICD-10-CM | POA: Diagnosis present

## 2017-09-23 LAB — DIFFERENTIAL
BASOS ABS: 0 10*3/uL (ref 0.0–0.1)
Basophils Relative: 0 %
Eosinophils Absolute: 0.2 10*3/uL (ref 0.0–0.7)
Eosinophils Relative: 2 %
LYMPHS ABS: 2.8 10*3/uL (ref 0.7–4.0)
Lymphocytes Relative: 30 %
MONOS PCT: 12 %
Monocytes Absolute: 1.1 10*3/uL — ABNORMAL HIGH (ref 0.1–1.0)
NEUTROS ABS: 5.4 10*3/uL (ref 1.7–7.7)
Neutrophils Relative %: 56 %

## 2017-09-23 LAB — RAPID URINE DRUG SCREEN, HOSP PERFORMED
AMPHETAMINES: NOT DETECTED
BENZODIAZEPINES: POSITIVE — AB
Barbiturates: NOT DETECTED
Cocaine: NOT DETECTED
OPIATES: POSITIVE — AB
TETRAHYDROCANNABINOL: NOT DETECTED

## 2017-09-23 LAB — COMPREHENSIVE METABOLIC PANEL
ALBUMIN: 3.2 g/dL — AB (ref 3.5–5.0)
ALT: 20 U/L (ref 14–54)
ANION GAP: 6 (ref 5–15)
AST: 24 U/L (ref 15–41)
Alkaline Phosphatase: 90 U/L (ref 38–126)
BUN: 15 mg/dL (ref 6–20)
CHLORIDE: 99 mmol/L — AB (ref 101–111)
CO2: 31 mmol/L (ref 22–32)
Calcium: 9 mg/dL (ref 8.9–10.3)
Creatinine, Ser: 0.63 mg/dL (ref 0.44–1.00)
GFR calc Af Amer: >60 mL/min (ref >60)
GLUCOSE: 79 mg/dL (ref 65–99)
POTASSIUM: 3.7 mmol/L (ref 3.5–5.1)
Sodium: 136 mmol/L (ref 135–145)
TOTAL PROTEIN: 6.1 g/dL — AB (ref 6.5–8.1)
Total Bilirubin: 0.3 mg/dL (ref 0.3–1.2)

## 2017-09-23 LAB — CBC
HEMATOCRIT: 41.6 % (ref 36.0–46.0)
HEMOGLOBIN: 12.9 g/dL (ref 12.0–15.0)
MCH: 29.4 pg (ref 26.0–34.0)
MCHC: 31 g/dL (ref 30.0–36.0)
MCV: 94.8 fL (ref 78.0–100.0)
Platelets: 284 10*3/uL (ref 150–400)
RBC: 4.39 MIL/uL (ref 3.87–5.11)
RDW: 15.4 % (ref 11.5–15.5)
WBC: 9.6 10*3/uL (ref 4.0–10.5)

## 2017-09-23 LAB — URINALYSIS, ROUTINE W REFLEX MICROSCOPIC
BILIRUBIN URINE: NEGATIVE
Glucose, UA: 50 mg/dL — AB
HGB URINE DIPSTICK: NEGATIVE
KETONES UR: NEGATIVE mg/dL
Leukocytes, UA: NEGATIVE
NITRITE: NEGATIVE
PH: 6 (ref 5.0–8.0)
Protein, ur: NEGATIVE mg/dL
SPECIFIC GRAVITY, URINE: 1.017 (ref 1.005–1.030)

## 2017-09-23 LAB — PROTIME-INR
INR: 0.92
Prothrombin Time: 12.3 seconds (ref 11.4–15.2)

## 2017-09-23 LAB — LIPASE, BLOOD: LIPASE: 18 U/L (ref 11–51)

## 2017-09-23 LAB — TROPONIN I: Troponin I: <0.03 ng/mL (ref <0.03)

## 2017-09-23 LAB — APTT: APTT: 29 s (ref 24–36)

## 2017-09-23 LAB — ETHANOL: Alcohol, Ethyl (B): <10 mg/dL (ref <10)

## 2017-09-23 MED ORDER — NALOXONE HCL 0.4 MG/ML IJ SOLN
0.4000 mg | Freq: Once | INTRAMUSCULAR | Status: AC
  Administered 2017-09-23: 0.4 mg via INTRAVENOUS
  Filled 2017-09-23: qty 1

## 2017-09-23 NOTE — ED Triage Notes (Signed)
Pt brought in by ems after daughter called with suspected stroke.  Slurred, slow speech noted with LKW last night before 9pm.  Pt has pinpoint pupils and states she had not slept in about 4 days prior to last night.  Family cannot access pt meds as she has them locked in safe and will not give them access.

## 2017-09-23 NOTE — ED Provider Notes (Signed)
[facility] EMERGENCY DEPARTMENT Provider Note   CSN: [id number] Arrival date & time: 09/23/17  0815     History   Chief Complaint Chief Complaint  Patient presents with  . Aphasia    HPI Carla [name] is a 66 y.o. female.  The history is provided by a relative, the EMS personnel and the patient. The history is limited by the condition of the patient (slurred speech).    Pt was seen at 0840.  Per EMS, pt, and family report, c/o unknown onset and persistence of constant "slurred speech" that was noticed this morning. LKW last night before 2100. Pt states she has not "felt well" in general for the past 3 to 4 days and has not slept. Family told EMS that they cannot access pt's meds because she has them locked in a safe. Pt does not have one specific complaint when asked and will start to talk randomly, ie: first deny CP or nausea, then say she has both symptoms, then she denies again.    Past Medical History:  Diagnosis Date  . Anginal pain (HCC)   . Anxiety   . Arthritis    "qwhere" (08/19/2016)  . Asthma   . Chronic back pain    "mainly mid to lower" (05/25/2017)  . Chronic bronchitis (HCC)   . Chronic knee pain   . COPD (chronic obstructive pulmonary disease) (HCC)   . Daily headache    (05/25/2017)  . 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 Dizziness   . Frequent falls   . GERD (gastroesophageal reflux disease)   . Hypertension   . Memory difficulty 2015  . Migraine    "4-5/year" (08/19/2016); "@ least once/month now" (05/25/2017)  . Myocardial infarction (HCC) 08/18/2016  . Neuropathy   . Pain management   . Pneumonia "several times"  . Pre-diabetes   . 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 Shortness of breath dyspnea    with exertion  . 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 Weakness of both legs     Patient Active Problem List   Diagnosis Date Noted  .  Bilateral leg edema 05/25/2017  . Precordial chest pain 05/25/2017  . Acute coronary syndrome (HCC) 08/19/2016    Class: Acute  . Contusion, multiple sites 06/01/2016  . Degenerative spondylolisthesis 05/09/2016  . Intractable nausea and vomiting 03/10/2016  . Abdominal pain 03/10/2016  . Abnormal thyroid function test 03/08/2016  . Chest pain, unspecified 03/07/2016  . Epigastric abdominal pain 03/07/2016  . Cholecystitis, acute 03/07/2016  . Chronic pain syndrome 03/07/2016  . Prediabetes 03/07/2016  . Obesity, morbid (HCC) 03/07/2016  . Elevated troponin 12/21/2015  . Fall 12/21/2015  . 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 Erythrocytosis   . Essential hypertension 06/29/2015  . 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 Anxiety attack 03/08/2012  . COPD (chronic obstructive pulmonary disease) (HCC) 03/05/2012    Past Surgical History:  Procedure Laterality Date  . APPENDECTOMY  1971  . BACK SURGERY    . CARDIAC CATHETERIZATION N/A 08/19/2016   Procedure: Left Heart Cath and Coronary Angiography;  Surgeon: Orpah Cobb, MD;  Location: [facility] INVASIVE CV LAB;  Service: Cardiovascular;  Laterality: N/A;  . CARDIAC CATHETERIZATION N/A 08/19/2016   Procedure: Coronary Stent Intervention;  Surgeon: Yates Decamp, MD;  Location: [facility] INVASIVE CV LAB;  Service: Cardiovascular;  Laterality: N/A;  . CHOLECYSTECTOMY N/A 03/12/2016   Procedure: LAPAROSCOPIC CHOLECYSTECTOMY;  Surgeon: Ancil LinseyJason Evan Davis, MD;  Location: [facility] ORS;  Service: General;  Laterality: N/A;  . KNEE ARTHROSCOPY Right 2007  . LUMBAR FUSION  05/2016   L4-L5 decompression and fusion   . TONSILLECTOMY  1971    OB History    Gravida Para Term Preterm AB Living   2 2 2          SAB TAB Ectopic Multiple Live Births                   Home Medications    Prior to Admission medications   Medication Sig Start Date End Date Taking? Authorizing Provider  albuterol (PROVENTIL HFA;VENTOLIN  HFA) 108 (90 BASE) MCG/ACT inhaler Inhale 2 puffs into the lungs every 6 (six) hours as needed for wheezing. 06/21/13  Yes Gosrani, Nimish C, MD  aspirin EC 81 MG tablet Take 81 mg by mouth daily.   Yes [provider]  atorvastatin (LIPITOR) 80 MG tablet Take 1 tablet (80 mg total) by mouth daily at 6 PM. 08/21/16  Yes Orpah CobbKadakia, Ajay, MD  budesonide-formoterol (SYMBICORT) 160-4.5 MCG/ACT inhaler Inhale 2 puffs into the lungs 2 (two) times daily.   Yes [provider]  Cholecalciferol (VITAMIN D) 2000 units CAPS Take 2,000 Units by mouth daily.    Yes [provider]  diazepam (VALIUM) 5 MG tablet Take 1 tablet (5 mg total) by mouth 3 (three) times daily as needed for anxiety. 05/13/16  Yes Pool, Sherilyn CooterHenry, MD  diphenhydrAMINE (BENADRYL) 25 mg capsule Take 50 mg by mouth every 6 (six) hours as needed.   Yes [provider]  EVZIO 0.4 MG/0.4ML SOAJ Inject 0.4 mLs into the muscle once as needed (for Anaphylaxis/allergic reaction).  02/19/15  Yes [provider]  furosemide (LASIX) 40 MG tablet Take 1 tablet (40 mg total) by mouth every Monday, Wednesday, and Friday. As needed for leg edema. 05/27/17  Yes Orpah CobbKadakia, Ajay, MD  gabapentin (NEURONTIN) 600 MG tablet Take 600 mg by mouth 3 (three) times daily.    Yes [provider]  lisinopril (PRINIVIL,ZESTRIL) 5 MG tablet Take 1 tablet (5 mg total) by mouth daily. 08/21/16  Yes Orpah CobbKadakia, Ajay, MD  metoprolol succinate (TOPROL-XL) 50 MG 24 hr tablet Take 50 mg by mouth daily.  12/14/15  Yes [provider]  nitroGLYCERIN (NITROSTAT) 0.4 MG SL tablet Place 1 tablet (0.4 mg total) under the tongue every 5 (five) minutes x 3 doses as needed for chest pain. 08/21/16  Yes Orpah CobbKadakia, Ajay, MD  omeprazole (PRILOSEC) 20 MG capsule Take 1 capsule (20 mg total) by mouth daily. 06/30/15  Yes Rabbani, Glendora ScoreMarjan, MD  oxyCODONE-acetaminophen (PERCOCET) 10-325 MG tablet Take 1 tablet by mouth every 4 (four) hours as needed for pain. Max  APAP 3gm/24 hrs from all sources Patient taking differently: Take 1 tablet by mouth 4 (four) times daily. Max APAP 3gm/24 hrs from all sources 06/05/16  Yes Reed, Tiffany L, DO  potassium chloride (K-DUR) 10 MEQ tablet Take 1 tablet (10 mEq total) by mouth 2 (two) times daily. 05/26/17  Yes Orpah CobbKadakia, Ajay, MD  ticagrelor (BRILINTA) 90 MG TABS tablet Take 1 tablet (90 mg total) by mouth 2 (two) times daily. 08/21/16  Yes Orpah CobbKadakia, Ajay, MD    Family History Family History  Problem Relation Age of Onset  . Heart failure Father   . Diabetes Father   . Kidney failure Father     Social History Social History   Tobacco Use  . Smoking status: Current  Every Day Smoker    Packs/day: 0.12    Years: 40.00    Pack years: 4.80    Types: Cigarettes    Start date: 08/05/1970  . Smokeless tobacco: Never Used  Substance Use Topics  . Alcohol use: Yes    Alcohol/week: 0.0 oz    Comment: 05/25/2017  "sip on NYE; if that"  . Drug use: No     Allergies   Bee venom; Coconut flavor; Mushroom extract complex; Amitriptyline; Toradol [ketorolac tromethamine]; Tramadol; and Trazodone and nefazodone   Review of Systems Review of Systems  Unable to perform ROS: Mental status change     Physical Exam Updated Vital Signs BP (!) 154/66 (BP Location: Left Arm)   Pulse 60   Temp (!) 97.4 F (36.3 C)   Resp (!) 9   Ht 5\' 4"  (1.626 m)   Wt 64.4 kg (142 lb)   SpO2 100%   BMI 24.37 kg/m    10:42:35 Orthostatic Vital Signs LC  Orthostatic Lying   BP- Lying: 149/64  Pulse- Lying: 70      Orthostatic Sitting  BP- Sitting: 164/80  Pulse- Sitting: 74      Orthostatic Standing at 0 minutes  BP- Standing at 0 minutes: 167/78  Pulse- Standing at 0 minutes: 79     Physical Exam 0845: Physical examination:  Nursing notes reviewed; Vital signs and O2 SAT reviewed;  Constitutional: Well developed, Well nourished, Well hydrated, In no acute distress; Head:  Normocephalic, atraumatic; Eyes: EOMI,  PERRL, No scleral icterus; ENMT: Mouth and pharynx normal, Mucous membranes moist; Neck: Supple, Full range of motion, No lymphadenopathy; Cardiovascular: Regular rate and rhythm, No gallop; Respiratory: Breath sounds clear & equal bilaterally, No wheezes.  Speaking full sentences with ease, Normal respiratory effort/excursion; Chest: Nontender, Movement normal; Abdomen: Soft, Nontender, Nondistended, Normal bowel sounds; Genitourinary: No CVA tenderness; Extremities: Pulses normal, No tenderness, No edema, No calf edema or asymmetry.; Neuro: Awake, alert, rambling and tangential historian. Major CN grossly intact. No facial droop. Speech intermittently slurred, hesitant, stuttering, then clear. No gross focal motor or sensory deficits in extremities.; Skin: Color normal, Warm, Dry.   ED Treatments / Results  Labs (all labs ordered are listed, but only abnormal results are displayed)   EKG  EKG Interpretation  Date/Time:  Wednesday September 23 2017 08:25:18 EST Ventricular Rate:  59 PR Interval:    QRS Duration: 86 QT Interval:  433 QTC Calculation: 429 R Axis:   63 Text Interpretation:  Sinus rhythm When compared with ECG of 05/26/2017 No significant change was found Confirmed by Samuel Jester [phone]) on 09/23/2017 9:06:00 AM       Radiology   Procedures Procedures (including critical care time)  Medications Ordered in ED Medications  naloxone [facility]) injection 0.4 mg (0.4 mg Intravenous Given 09/23/17 0913)     Initial Impression / Assessment and Plan / ED Course  I have reviewed the triage vital signs and the nursing notes.  Pertinent labs & imaging results that were available during my care of the patient were reviewed by me and considered in my medical decision making (see chart for details).  MDM Reviewed: previous chart, nursing note and vitals Reviewed previous: labs and ECG Interpretation: labs, ECG, MRI and x-ray   Results for orders placed or performed  during the hospital encounter of 09/23/17  Ethanol  Result Value Ref Range   Alcohol, Ethyl (B) <10 <10 mg/dL  Protime-INR  Result Value Ref Range   Prothrombin Time 12.3 11.4 -  15.2 seconds   INR 0.92   APTT  Result Value Ref Range   aPTT 29 24 - 36 seconds  CBC  Result Value Ref Range   WBC 9.6 4.0 - 10.5 K/uL   RBC 4.39 3.87 - 5.11 MIL/uL   Hemoglobin 12.9 12.0 - 15.0 g/dL   HCT 16.1 09.6 - 04.5 %   MCV 94.8 78.0 - 100.0 fL   MCH 29.4 26.0 - 34.0 pg   MCHC 31.0 30.0 - 36.0 g/dL   RDW 40.9 81.1 - 91.4 %   Platelets 284 150 - 400 K/uL  Differential  Result Value Ref Range   Neutrophils Relative % 56 %   Neutro Abs 5.4 1.7 - 7.7 K/uL   Lymphocytes Relative 30 %   Lymphs Abs 2.8 0.7 - 4.0 K/uL   Monocytes Relative 12 %   Monocytes Absolute 1.1 (H) 0.1 - 1.0 K/uL   Eosinophils Relative 2 %   Eosinophils Absolute 0.2 0.0 - 0.7 K/uL   Basophils Relative 0 %   Basophils Absolute 0.0 0.0 - 0.1 K/uL  Comprehensive metabolic panel  Result Value Ref Range   Sodium 136 135 - 145 mmol/L   Potassium 3.7 3.5 - 5.1 mmol/L   Chloride 99 (L) 101 - 111 mmol/L   CO2 31 22 - 32 mmol/L   Glucose, Bld 79 65 - 99 mg/dL   BUN 15 6 - 20 mg/dL   Creatinine, Ser 7.82 0.44 - 1.00 mg/dL   Calcium 9.0 8.9 - 95.6 mg/dL   Total Protein 6.1 (L) 6.5 - 8.1 g/dL   Albumin 3.2 (L) 3.5 - 5.0 g/dL   AST 24 15 - 41 U/L   ALT 20 14 - 54 U/L   Alkaline Phosphatase 90 38 - 126 U/L   Total Bilirubin 0.3 0.3 - 1.2 mg/dL   GFR calc non Af Amer >60 >60 mL/min   GFR calc Af Amer >60 >60 mL/min   Anion gap 6 5 - 15  Urine rapid drug screen (hosp performed)not at [facility]  Result Value Ref Range   Opiates POSITIVE (A) NONE DETECTED   Cocaine NONE DETECTED NONE DETECTED   Benzodiazepines POSITIVE (A) NONE DETECTED   Amphetamines NONE DETECTED NONE DETECTED   Tetrahydrocannabinol NONE DETECTED NONE DETECTED   Barbiturates NONE DETECTED NONE DETECTED  Urinalysis, Routine w reflex microscopic  Result Value Ref  Range   Color, Urine YELLOW YELLOW   APPearance CLEAR CLEAR   Specific Gravity, Urine 1.017 1.005 - 1.030   pH 6.0 5.0 - 8.0   Glucose, UA 50 (A) NEGATIVE mg/dL   Hgb urine dipstick NEGATIVE NEGATIVE   Bilirubin Urine NEGATIVE NEGATIVE   Ketones, ur NEGATIVE NEGATIVE mg/dL   Protein, ur NEGATIVE NEGATIVE mg/dL   Nitrite NEGATIVE NEGATIVE   Leukocytes, UA NEGATIVE NEGATIVE  Troponin I  Result Value Ref Range   Troponin I <0.03 <0.03 ng/mL  Lipase, blood  Result Value Ref Range   Lipase 18 11 - 51 U/L   Dg Chest 2 View Result Date: 09/23/2017 CLINICAL DATA:  Generalized weakness EXAM: CHEST  2 VIEW COMPARISON:  May 25, 2017 FINDINGS: There is no edema or consolidation. The heart size and pulmonary vascularity are normal. No adenopathy. There is aortic atherosclerosis. No bone lesions are evident. IMPRESSION: Aortic atherosclerosis. No edema or consolidation. Cardiac silhouette within normal limits. Aortic Atherosclerosis (ICD10-I70.0). Electronically Signed   By: Bretta Bang III M.D.   On: 09/23/2017 09:55   Mr Brain Wo Contrast (neuro  Protocol) Result Date: 09/23/2017 CLINICAL DATA:  Difficulty with speech.  Suspected stroke. EXAM: MRI HEAD WITHOUT CONTRAST TECHNIQUE: Multiplanar, multiecho pulse sequences of the brain and surrounding structures were obtained without intravenous contrast. COMPARISON:  06/30/2015 FINDINGS: Brain: No acute infarction, hemorrhage, hydrocephalus, extra-axial collection or mass lesion. Mild T2 hyperintensity in the pons and cerebral white matter that is stable from prior and likely from chronic small vessel ischemia given patient's vascular risk factors. Normal brain volume. Vascular: Major flow voids are preserved. Skull and upper cervical spine: No evidence of marrow lesion Sinuses/Orbits: Negative Other: Intermittent moderate motion degradation that could obscure pathology. Diffusion imaging is diagnostic. IMPRESSION: 1. No acute finding or change from  2016. 2. Mild presumed chronic microvascular ischemic change in the pons and cerebral white matter. 3. Intermittent moderate motion degradation. Electronically Signed   By: Marnee Spring M.D.   On: 09/23/2017 10:00    1200: IV narcan given with good effect. Pt is now awake/alert. Denies taking meds to excess. Denies SI. Workup reassuring. Pt has ambulated with steady gait, easy resps, NAD. Speech is clear, neuro exam intact. Pt has tol PO well without N/V. Pt is not orthostatic on VS.  Pt states she wants to go home now. Pt cautioned regarding medication overuse.  Dx and testing d/w pt and family.  Questions answered.  Verb understanding, agreeable to d/c home with outpt f/u.   Final Clinical Impressions(s) / ED Diagnoses   Final diagnoses:  None    ED Discharge Orders    None       Samuel Jester, DO 09/28/17 1753

## 2017-09-23 NOTE — ED Notes (Signed)
Pt ambulated with assistance,  o2 sat remained 100%.

## 2017-09-23 NOTE — ED Notes (Signed)
Pt much more alert at this time.  Insists she did not take more medication than she was supposed to, but last time they gave her Narcan and it made her feel like bugs were crawling on her.  Placed back on monitor after return from MRI.

## 2017-09-23 NOTE — ED Notes (Signed)
Pt tolerating lunch tray, states that she is ready to go,

## 2017-09-23 NOTE — ED Notes (Signed)
Pt in mri 

## 2017-09-23 NOTE — Discharge Instructions (Signed)
Take your usual prescriptions as previously directed.  Call your regular medical doctor today to schedule a follow up appointment within the next 2 days.  Return to the Emergency Department immediately sooner if worsening.  ° °

## 2017-09-23 NOTE — ED Notes (Signed)
ED Provider at bedside. 

## 2017-12-28 ENCOUNTER — Emergency Department (HOSPITAL_COMMUNITY)
Admission: EM | Admit: 2017-12-28 | Discharge: 2017-12-28 | Disposition: A | Discharge status: Home / Self Care | Payer: Medicare Other | Attending: Emergency Medicine | Admitting: Emergency Medicine

## 2017-12-28 ENCOUNTER — Encounter (HOSPITAL_COMMUNITY): Payer: Self-pay | Admitting: Emergency Medicine

## 2017-12-28 ENCOUNTER — Emergency Department (HOSPITAL_COMMUNITY): Payer: Medicare Other

## 2017-12-28 DIAGNOSIS — Z7982 Long term (current) use of aspirin: Secondary | ICD-10-CM | POA: Insufficient documentation

## 2017-12-28 DIAGNOSIS — M549 Dorsalgia, unspecified: Secondary | ICD-10-CM | POA: Diagnosis not present

## 2017-12-28 DIAGNOSIS — G8929 Other chronic pain: Secondary | ICD-10-CM | POA: Insufficient documentation

## 2017-12-28 DIAGNOSIS — J449 Chronic obstructive pulmonary disease, unspecified: Secondary | ICD-10-CM | POA: Insufficient documentation

## 2017-12-28 DIAGNOSIS — J45909 Unspecified asthma, uncomplicated: Secondary | ICD-10-CM | POA: Insufficient documentation

## 2017-12-28 DIAGNOSIS — I119 Hypertensive heart disease without heart failure: Secondary | ICD-10-CM | POA: Diagnosis not present

## 2017-12-28 DIAGNOSIS — Z79899 Other long term (current) drug therapy: Secondary | ICD-10-CM | POA: Diagnosis not present

## 2017-12-28 DIAGNOSIS — I249 Acute ischemic heart disease, unspecified: Secondary | ICD-10-CM | POA: Diagnosis not present

## 2017-12-28 DIAGNOSIS — F17210 Nicotine dependence, cigarettes, uncomplicated: Secondary | ICD-10-CM | POA: Diagnosis not present

## 2017-12-28 DIAGNOSIS — M545 Low back pain, unspecified: Secondary | ICD-10-CM

## 2017-12-28 MED ORDER — METHOCARBAMOL 500 MG PO TABS
500.0000 mg | ORAL_TABLET | Freq: Once | ORAL | Status: AC
  Administered 2017-12-28: 500 mg via ORAL
  Filled 2017-12-28: qty 1

## 2017-12-28 MED ORDER — METHOCARBAMOL 500 MG PO TABS: 500.0000 mg | ORAL_TABLET | Freq: Two times a day (BID) | ORAL | 0 refills | Status: DC

## 2017-12-28 MED ORDER — HYDROMORPHONE HCL 1 MG/ML IJ SOLN
1.0000 mg | Freq: Once | INTRAMUSCULAR | Status: AC
  Administered 2017-12-28: 1 mg via INTRAVENOUS
  Filled 2017-12-28: qty 1

## 2017-12-28 NOTE — ED Provider Notes (Signed)
Emergency Department Provider Note   I have reviewed the triage vital signs and the nursing notes.   HISTORY  Chief Complaint Back Pain   HPI Carla [name] is a 67 y.o. female's to the emergency department for evaluation of acute on chronic back pain.  Patient has been trying to get up and around to improve her chronic symptoms when she bent over to pick something up off the floor and had severe midline back pain radiating to both legs.  She denies any weakness or bowel/bladder symptoms.  She does have some tingling in both legs.  She states that she intermittently gets flares that are similar to this including the tingling sensation in the legs.  No groin numbness.  She is followed in pain management clinic and has had spine surgery in the past.  She denies any chest pain, abdominal pain, back pain, UTI symptoms.    Past Medical History:  Diagnosis Date  . Anginal pain (HCC)   . Anxiety   . Arthritis    "qwhere" (08/19/2016)  . Asthma   . Chronic back pain    "mainly mid to lower" (05/25/2017)  . Chronic bronchitis (HCC)   . Chronic knee pain   . COPD (chronic obstructive pulmonary disease) (HCC)   . Daily headache    (05/25/2017)  . 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 Dizziness   . Frequent falls   . GERD (gastroesophageal reflux disease)   . Hypertension   . Memory difficulty 2015  . Migraine    "4-5/year" (08/19/2016); "@ least once/month now" (05/25/2017)  . Myocardial infarction (HCC) 08/18/2016  . Neuropathy   . Pain management   . Pneumonia "several times"  . Pre-diabetes   . 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 Shortness of breath dyspnea    with exertion  . 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 Weakness of both legs     Patient Active Problem List   Diagnosis Date Noted  . Bilateral leg edema 05/25/2017  . Precordial chest pain 05/25/2017  .  Acute coronary syndrome (HCC) 08/19/2016    Class: Acute  . Contusion, multiple sites 06/01/2016  . Degenerative spondylolisthesis 05/09/2016  . Intractable nausea and vomiting 03/10/2016  . Abdominal pain 03/10/2016  . Abnormal thyroid function test 03/08/2016  . Chest pain, unspecified 03/07/2016  . Epigastric abdominal pain 03/07/2016  . Cholecystitis, acute 03/07/2016  . Chronic pain syndrome 03/07/2016  . Prediabetes 03/07/2016  . Obesity, morbid (HCC) 03/07/2016  . Elevated troponin 12/21/2015  . Fall 12/21/2015  . 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 Erythrocytosis   . Essential hypertension 06/29/2015  . 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 Anxiety attack 03/08/2012  . COPD (chronic obstructive pulmonary disease) (HCC) 03/05/2012    Past Surgical History:  Procedure Laterality Date  . APPENDECTOMY  1971  . BACK SURGERY    . CARDIAC CATHETERIZATION N/A 08/19/2016   Procedure: Left Heart Cath and Coronary Angiography;  Surgeon: Orpah Cobb, MD;  Location: [facility] INVASIVE CV LAB;  Service: Cardiovascular;  Laterality: N/A;  . CARDIAC CATHETERIZATION N/A 08/19/2016   Procedure: Coronary Stent Intervention;  Surgeon: Yates Decamp, MD;  Location: [facility] INVASIVE CV LAB;  Service: Cardiovascular;  Laterality: N/A;  . CHOLECYSTECTOMY N/A 03/12/2016   Procedure: LAPAROSCOPIC CHOLECYSTECTOMY;  Surgeon: Ancil Linsey, MD;  Location: [facility] ORS;  Service: General;  Laterality: N/A;  . KNEE ARTHROSCOPY Right 2007  .  LUMBAR FUSION  05/2016   L4-L5 decompression and fusion   . TONSILLECTOMY  1971    Current Outpatient Rx  . Order #: [id number] Class: Print  . Order #: [id number] Class: Historical Med  . Order #: [id number] Class: Normal  . Order #: [id number] Class: Historical Med  . Order #: [id number] Class: Historical Med  . Order #: [id number] Class: Print  . Order #: [id number] Class: Historical Med  . Order #: [id number] Class: Historical Med  . Order #:  [id number] Class: Normal  . Order #: [id number] Class: Historical Med  . Order #: [id number] Class: Normal  . Order #: [id number] Class: Historical Med  . Order #: [id number] Class: Normal  . Order #: [id number] Class: Normal  . Order #: [id number] Class: Print  . Order #: [id number] Class: Print  . Order #: [id number] Class: Normal  . Order #: [id number] Class: Print    Allergies Bee venom; Coconut flavor; Mushroom extract complex; Amitriptyline; Toradol [ketorolac tromethamine]; Tramadol; and Trazodone and nefazodone  Family History  Problem Relation Age of Onset  . Heart failure Father   . Diabetes Father   . Kidney failure Father     Social History Social History   Tobacco Use  . Smoking status: Current Every Day Smoker    Packs/day: 0.12    Years: 40.00    Pack years: 4.80    Types: Cigarettes    Start date: 08/05/1970  . Smokeless tobacco: Never Used  Substance Use Topics  . Alcohol use: Yes    Alcohol/week: 0.0 oz    Comment: 05/25/2017  "sip on NYE; if that"  . Drug use: No    Review of Systems  Constitutional: No fever/chills Eyes: No visual changes. ENT: No sore throat. Cardiovascular: Denies chest pain. Respiratory: Denies shortness of breath. Gastrointestinal: No abdominal pain. No nausea, no vomiting. No diarrhea. No constipation. Genitourinary: Negative for dysuria. Musculoskeletal: Positive for back pain. Skin: Negative for rash. Neurological: Negative for headaches, focal weakness or numbness. Positive leg tingling.   10-point ROS otherwise negative.  ____________________________________________   PHYSICAL EXAM:  VITAL SIGNS: ED Triage Vitals [12/28/17 1555]  Enc Vitals Group     BP (!) 127/57     Pulse Rate 65     Resp 18     Temp 97.8 F (36.6 C)     Temp Source Oral     SpO2 100 %     Weight 145 lb (65.8 kg)     Height 5\' 4"  (1.626 m)     Pain Score 10   Constitutional: Alert and oriented. Well appearing and in no acute distress. Eyes:  Conjunctivae are normal.  Head: Atraumatic. Nose: No congestion/rhinnorhea. Mouth/Throat: Mucous membranes are moist. Neck: No stridor.  Cardiovascular: Normal rate, regular rhythm. Good peripheral circulation. Grossly normal heart sounds.   Respiratory: Normal respiratory effort.  No retractions. Lungs CTAB. Gastrointestinal: Soft and nontender. No distention.  Musculoskeletal: No lower extremity tenderness nor edema. No gross deformities of extremities. Neurologic:  Normal speech and language. No gross focal neurologic deficits are appreciated.  Skin:  Skin is warm, dry and intact. No rash noted.  ____________________________________________  RADIOLOGY  Dg Lumbar Spine 2-3 Views  Result Date: 12/28/2017 CLINICAL DATA:  Low back pain after bending and lifting. EXAM: LUMBAR SPINE - 2-3 VIEW COMPARISON:  12/24/2016 FINDINGS: Previous discectomy and fusion procedure at what has been described as L4-5 previously. No evidence of ongoing motion or nonunion. No evidence of regional fracture. Mild disc space narrowing L2-3 and L3-4. Mild facet osteoarthritis L3-4 and L5-S1. IMPRESSION: No acute or traumatic finding.  No change in appearance of the L4-5 discectomy and fusion. Mild degenerative changes above and below. Electronically Signed   By: Paulina Fusi M.D.   On: 12/28/2017 19:21    ____________________________________________   PROCEDURES  Procedure(s) performed:   Procedures  None ____________________________________________   INITIAL IMPRESSION / ASSESSMENT AND PLAN / ED COURSE  Pertinent labs & imaging results that were available during my care of the patient were reviewed by me and considered in my medical decision making (see chart for details).  Patient presents to the emergency department for evaluation of acute on chronic lower back pain.  She has normal strength bilaterally.  Sensation.  No concern for acute spine emergency.  Plain film of the lower back reviewed with no  acute findings.  This feels similar to her prior acute back pain episodes.  Patient was given 1 dose of Dilaudid for acute pain management.  I followed this with Robaxin and her symptoms improved.  I advised her to follow with her pain management physician and provided a short course of Robaxin for home.   At this time, I do not feel there is any life-threatening condition present. I have reviewed and discussed all results (EKG, imaging, lab, urine as appropriate), exam findings with patient. I have reviewed nursing notes and appropriate previous records.  I feel the patient is safe to be discharged home without further emergent workup. Discussed usual and customary return precautions. Patient and family (if present) verbalize understanding and are comfortable with this plan.  Patient will follow-up with their primary care provider. If they do not have a primary care provider, information for follow-up has been provided to them. All questions have been answered.    ____________________________________________  FINAL CLINICAL IMPRESSION(S) / ED DIAGNOSES  Final diagnoses:  Acute midline low back pain without sciatica     MEDICATIONS GIVEN DURING THIS VISIT:  Medications  HYDROmorphone (DILAUDID) injection 1 mg (1 mg Intravenous Given 12/28/17 1842)  methocarbamol (ROBAXIN) tablet 500 mg (500 mg Oral Given 12/28/17 2014)     NEW OUTPATIENT MEDICATIONS STARTED DURING THIS VISIT:  Discharge Medication List as of 12/28/2017  9:55 PM    START taking these medications   Details  methocarbamol (ROBAXIN) 500 MG tablet Take 1 tablet (500 mg total) by mouth 2 (two) times daily., Starting Mon 12/28/2017, Print        Note:  This document was prepared using Dragon voice recognition software and may include unintentional dictation errors.  Alona Bene, MD Emergency Medicine    Jerryl Holzhauer, Arlyss Repress, MD 12/28/17 2219

## 2017-12-28 NOTE — ED Notes (Signed)
Pt ambulatory to restroom without difficulty.

## 2017-12-28 NOTE — ED Triage Notes (Signed)
Pt reports lower back pain after bending down to pick something up off of the floor.  States this has happened before due to the hardware in her back.

## 2017-12-28 NOTE — ED Notes (Signed)
[phone]  Carla [name]

## 2017-12-28 NOTE — Discharge Instructions (Signed)

## 2021-10-28 NOTE — Congregational Nurse Program (Signed)
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   Dizziness    Frequent falls    GERD (gastroesophageal reflux disease)    Hypertension    Memory difficulty 2015   Migraine    "4-5/year" (08/19/2016); "@ least once/month now" (05/25/2017)   Myocardial infarction ([place]) 08/18/2016   Neuropathy    Pain management    Pneumonia "several times"   Pre-diabetes    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 Yes    Location Patient [place], [place] or Organization    Patient Status Homeless    Insurance [place]    Insurance Referral N/A    Medication N/A    Medical Provider No    Screening Referrals N/A    Medical Referral N/A    Medical Appointment Made N/A    Food Have Food Insecurities    Transportation N/A    Housing/Utilities No permanent housing    Interpersonal Safety N/A    Intervention Blood pressure;Educate    ED Visit Averted N/A    Life-Saving Intervention Made N/A           Stated she has an appointment to see the Podiatrist on next week but will wait to see where she finds housing before she finds a PCP BP  189/94, P 81 Encouraged to find a PCP ASAP and explained the  dangers of having HTN. Voiced understanding. Erma Heritage RN

## 2021-10-28 NOTE — Congregational Nurse Program (Signed)
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 Had a MI a few years ago and hasn't been seen by a MD over 2 years. Told her abot the [facility] in [place] and asked if  she would like for me to schedule an appointment but she said she would like to see where she would be staying first  BP 173/83, PR 60 Needs to be seen by a Podiatrist also. Jenene Slicker RN

## 2021-11-06 NOTE — Congregational Nurse Program (Signed)
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   Dizziness    Frequent falls    GERD (gastroesophageal reflux disease)    Hypertension    Memory difficulty 2015   Migraine    "4-5/year" (08/19/2016); "@ least once/month now" (05/25/2017)   Myocardial infarction (HCC) 08/18/2016   Neuropathy    Pain management    Pneumonia "several times"   Pre-diabetes    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 Continues to look for housing but will not find a medical MD  until she  knows where she will be located.BP165/85 P 71. Reminded of the importance of seeing MD for Prevention measures to keep from having any further health issues. Voiced understanding Jenene Slicker RN

## 2021-11-21 NOTE — Congregational Nurse Program (Signed)
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   Dizziness    Frequent falls    GERD (gastroesophageal reflux disease)    Hypertension    Memory difficulty 2015   Migraine    "4-5/year" (08/19/2016); "@ least once/month now" (05/25/2017)   Myocardial infarction (HCC) 08/18/2016   Neuropathy    Pain management    Pneumonia "several times"   Pre-diabetes    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 Has an appointment to see PA at [facility] next week. BP 165/85  P 71 Jenene Slicker RN

## 2021-11-26 NOTE — Congregational Nurse Program (Signed)
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   Dizziness    Frequent falls    GERD (gastroesophageal reflux disease)    Hypertension    Memory difficulty 2015   Migraine    "4-5/year" (08/19/2016); "@ least once/month now" (05/25/2017)   Myocardial infarction (HCC) 08/18/2016   Neuropathy    Pain management    Pneumonia "several times"   Pre-diabetes    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 Yes    Location Patient Served  Home of [employer], [employer]    Visit Setting [employer] or Organization    Patient Status Homeless    Insurance [employer] Referral N/A    Medication N/A    Medical Provider No    Screening Referrals N/A    Medical Referral N/A    Medical Appointment Made N/A    Food Have Food Insecurities    Transportation N/A    Housing/Utilities No permanent housing    Interpersonal Safety N/A    Intervention Blood pressure;Educate    ED Visit Averted N/A    Life-Saving Intervention Made N/A           Stated she is still looking for a physician. Reminded again of the importance of being seen. Stated she will call tomorrow BP   P 77 Jenene Slicker Rn

## 2022-01-11 DIAGNOSIS — I639 Cerebral infarction, unspecified: Secondary | ICD-10-CM

## 2022-01-11 HISTORY — DX: Cerebral infarction, unspecified: I63.9

## 2022-02-18 ENCOUNTER — Emergency Department (HOSPITAL_COMMUNITY): Payer: Medicare HMO

## 2022-02-18 ENCOUNTER — Encounter (HOSPITAL_COMMUNITY): Payer: Self-pay

## 2022-02-18 ENCOUNTER — Emergency Department (HOSPITAL_COMMUNITY)
Admission: EM | Admit: 2022-02-18 | Discharge: 2022-02-18 | Disposition: A | Discharge status: Home / Self Care | Payer: Medicare HMO | Attending: Emergency Medicine | Admitting: Emergency Medicine

## 2022-02-18 DIAGNOSIS — J45909 Unspecified asthma, uncomplicated: Secondary | ICD-10-CM | POA: Insufficient documentation

## 2022-02-18 DIAGNOSIS — M6281 Muscle weakness (generalized): Secondary | ICD-10-CM | POA: Diagnosis present

## 2022-02-18 DIAGNOSIS — Z87891 Personal history of nicotine dependence: Secondary | ICD-10-CM | POA: Diagnosis not present

## 2022-02-18 DIAGNOSIS — R059 Cough, unspecified: Secondary | ICD-10-CM | POA: Diagnosis not present

## 2022-02-18 DIAGNOSIS — Z8673 Personal history of transient ischemic attack (TIA), and cerebral infarction without residual deficits: Secondary | ICD-10-CM | POA: Diagnosis not present

## 2022-02-18 DIAGNOSIS — I10 Essential (primary) hypertension: Secondary | ICD-10-CM | POA: Diagnosis not present

## 2022-02-18 DIAGNOSIS — J449 Chronic obstructive pulmonary disease, unspecified: Secondary | ICD-10-CM | POA: Insufficient documentation

## 2022-02-18 DIAGNOSIS — Z20822 Contact with and (suspected) exposure to covid-19: Secondary | ICD-10-CM | POA: Diagnosis not present

## 2022-02-18 DIAGNOSIS — R4182 Altered mental status, unspecified: Secondary | ICD-10-CM | POA: Diagnosis present

## 2022-02-18 DIAGNOSIS — I6523 Occlusion and stenosis of bilateral carotid arteries: Secondary | ICD-10-CM | POA: Insufficient documentation

## 2022-02-18 DIAGNOSIS — R531 Weakness: Secondary | ICD-10-CM | POA: Insufficient documentation

## 2022-02-18 LAB — URINALYSIS, ROUTINE W REFLEX MICROSCOPIC
Bilirubin Urine: NEGATIVE
Glucose, UA: NEGATIVE mg/dL
Hgb urine dipstick: NEGATIVE
Ketones, ur: NEGATIVE mg/dL
Nitrite: NEGATIVE
Protein, ur: 30 mg/dL — AB
Specific Gravity, Urine: >1.046 — ABNORMAL HIGH (ref 1.005–1.030)
pH: 5 (ref 5.0–8.0)

## 2022-02-18 LAB — DIFFERENTIAL
Abs Immature Granulocytes: 0.04 10*3/uL (ref 0.00–0.07)
Basophils Absolute: 0 10*3/uL (ref 0.0–0.1)
Basophils Relative: 0 %
Eosinophils Absolute: 0.1 10*3/uL (ref 0.0–0.5)
Eosinophils Relative: 1 %
Immature Granulocytes: 0 %
Lymphocytes Relative: 40 %
Lymphs Abs: 3.9 10*3/uL (ref 0.7–4.0)
Monocytes Absolute: 1.1 10*3/uL — ABNORMAL HIGH (ref 0.1–1.0)
Monocytes Relative: 11 %
Neutro Abs: 4.7 10*3/uL (ref 1.7–7.7)
Neutrophils Relative %: 48 %

## 2022-02-18 LAB — RAPID URINE DRUG SCREEN, HOSP PERFORMED
Amphetamines: NOT DETECTED
Barbiturates: NOT DETECTED
Benzodiazepines: NOT DETECTED
Cocaine: NOT DETECTED
Opiates: NOT DETECTED
Tetrahydrocannabinol: NOT DETECTED

## 2022-02-18 LAB — COMPREHENSIVE METABOLIC PANEL
ALT: 21 U/L (ref 0–44)
AST: 22 U/L (ref 15–41)
Albumin: 3.8 g/dL (ref 3.5–5.0)
Alkaline Phosphatase: 101 U/L (ref 38–126)
Anion gap: 10 (ref 5–15)
BUN: 22 mg/dL (ref 8–23)
CO2: 25 mmol/L (ref 22–32)
Calcium: 9.6 mg/dL (ref 8.9–10.3)
Chloride: 101 mmol/L (ref 98–111)
Creatinine, Ser: 0.76 mg/dL (ref 0.44–1.00)
GFR, Estimated: >60 mL/min (ref >60)
Glucose, Bld: 212 mg/dL — ABNORMAL HIGH (ref 70–99)
Potassium: 3.9 mmol/L (ref 3.5–5.1)
Sodium: 136 mmol/L (ref 135–145)
Total Bilirubin: 0.4 mg/dL (ref 0.3–1.2)
Total Protein: 7.9 g/dL (ref 6.5–8.1)

## 2022-02-18 LAB — RESP PANEL BY RT-PCR (FLU A&B, COVID) ARPGX2
Influenza A by PCR: NEGATIVE
Influenza B by PCR: NEGATIVE
SARS Coronavirus 2 by RT PCR: NEGATIVE

## 2022-02-18 LAB — CBC
HCT: 50.7 % — ABNORMAL HIGH (ref 36.0–46.0)
Hemoglobin: 16.1 g/dL — ABNORMAL HIGH (ref 12.0–15.0)
MCH: 28.8 pg (ref 26.0–34.0)
MCHC: 31.8 g/dL (ref 30.0–36.0)
MCV: 90.7 fL (ref 80.0–100.0)
Platelets: 245 10*3/uL (ref 150–400)
RBC: 5.59 MIL/uL — ABNORMAL HIGH (ref 3.87–5.11)
RDW: 12.9 % (ref 11.5–15.5)
WBC: 9.9 10*3/uL (ref 4.0–10.5)
nRBC: 0 % (ref 0.0–0.2)

## 2022-02-18 LAB — PROTIME-INR
INR: 0.9 (ref 0.8–1.2)
Prothrombin Time: 12.4 seconds (ref 11.4–15.2)

## 2022-02-18 LAB — APTT: aPTT: 28 seconds (ref 24–36)

## 2022-02-18 LAB — ETHANOL: Alcohol, Ethyl (B): <10 mg/dL (ref <10)

## 2022-02-18 LAB — CBG MONITORING, ED: Glucose-Capillary: 231 mg/dL — ABNORMAL HIGH (ref 70–99)

## 2022-02-18 MED ORDER — BENZONATATE 100 MG PO CAPS: 100.0000 mg | ORAL_CAPSULE | Freq: Three times a day (TID) | ORAL | 0 refills | Status: DC | PRN

## 2022-02-18 MED ORDER — IOHEXOL 350 MG/ML SOLN
75.0000 mL | Freq: Once | INTRAVENOUS | Status: AC | PRN
  Administered 2022-02-18: 75 mL via INTRAVENOUS

## 2022-02-18 NOTE — Discharge Instructions (Addendum)
You were seen in the emergency room today with generalized weakness and cough.  Your x-ray did not show any pneumonia.  Your MRI did not show stroke.  Your labs are reassuring.  Have sent some cough medicine to your pharmacy.  I will have you continue working with your physical therapy and Occupational Therapy team at home.  Return with any new or suddenly worsening symptoms.  ?

## 2022-02-18 NOTE — ED Provider Notes (Signed)
? ?Emergency Department Provider Note ? ? ?I have reviewed the triage vital signs and the nursing notes. ? ? ?HISTORY ? ?Chief Complaint ?Weakness ? ? ?HPI ?Carla [name] is a 71 y.o. female presents to the ED with acute onset generalized weakness.  Patient was recently admitted at the [facility] with altered mental status.  On MRI she was found to have left frontal lobe acute/subacute infarct and was admitted for stroke evaluation.  She was ultimately discharged to home with PT/OT.  Patient reports generalized weakness, question of worse on the right side, gradually worsening throughout the day.  Last normal is difficult to obtain but patient recalls waking at 1:30 AM with no deficits. She denies any headache or neck/back pain.  ? ? ?Past Medical History:  ?Diagnosis Date  ? Anginal pain ([place])   ? Anxiety   ? Arthritis   ? "qwhere" (08/19/2016)  ? Asthma   ? Chronic back pain   ? "mainly mid to lower" (05/25/2017)  ? Chronic bronchitis ([place])   ? Chronic knee pain   ? COPD (chronic obstructive pulmonary disease) ([place])   ? Daily headache   ? (05/25/2017)  ? 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 Dizziness   ? Frequent falls   ? GERD (gastroesophageal reflux disease)   ? Hypertension   ? Memory difficulty 2015  ? Migraine   ? "4-5/year" (08/19/2016); "@ least once/month now" (05/25/2017)  ? Myocardial infarction ([place]) 08/18/2016  ? Neuropathy   ? Pain management   ? Pneumonia "several times"  ? Pre-diabetes   ? 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 "likely related to situational and psychosocial stress, depression, pain"  ? Shortness of breath dyspnea   ? with exertion  ? 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 Weakness of both legs   ? ? ?Review of Systems ? ?Constitutional: No fever/chills ?Eyes: No visual changes. ?ENT: No sore throat. ?Cardiovascular: Denies chest pain. ?Respiratory: Denies shortness of breath. ?Gastrointestinal: No abdominal pain.  No nausea, no vomiting.  No  diarrhea.  No constipation. ?Genitourinary: Negative for dysuria. ?Musculoskeletal: Negative for back pain. ?Skin: Negative for rash. ?Neurological: Negative for headaches. Positive generalized weakness.  ? ? ?____________________________________________ ? ? ?PHYSICAL EXAM: ? ?VITAL SIGNS: ?ED Triage Vitals  ?Enc Vitals Group  ?   BP 02/18/22 1542 (!) 184/64  ?   Pulse Rate 02/18/22 1542 67  ?   Resp 02/18/22 1542 (!) 26  ?   Temp 02/18/22 1542 97.7 ?F (36.5 ?C)  ?   Temp Source 02/18/22 1542 Oral  ?   SpO2 02/18/22 1542 96 %  ?   Weight 02/18/22 1544 145 lb 1 oz (65.8 kg)  ?   Height 02/18/22 1544 5\' 4"  (1.626 m)  ? ?Constitutional: Alert and oriented. Patient visibly frustrated with neuro exam and weakness.  ?Eyes: Conjunctivae are normal. PERRL.  ?Head: Atraumatic. ?Nose: No congestion/rhinnorhea. ?Mouth/Throat: Mucous membranes are moist.   ?Neck: No stridor. No cervical spine tenderness to palpation. ?Cardiovascular: Normal rate, regular rhythm. Good peripheral circulation. Grossly normal heart sounds.   ?Respiratory: Normal respiratory effort.  No retractions. Lungs CTAB. ?Gastrointestinal: Soft and nontender. No distention.  ?Musculoskeletal: No lower extremity tenderness nor edema. No gross deformities of extremities. ?Neurologic:  Normal speech and language.  Patient with generalized weakness in the bilateral upper and lower extremities.  I do not appreciate a focal right or left sided weakness worse than the other. No facial droop. Normal sensation bilaterally.  ?Skin:  Skin is warm, dry and intact. No rash noted. ? ?____________________________________________ ?  ?LABS ?(all labs ordered are listed, but only abnormal results are displayed) ? ?Labs Reviewed  ?CBC - Abnormal; Notable for the following components:  ?    Result Value  ? RBC 5.59 (*)   ? Hemoglobin 16.1 (*)   ? HCT 50.7 (*)   ? All other components within normal limits  ?DIFFERENTIAL - Abnormal; Notable for the following components:  ?  Monocytes Absolute 1.1 (*)   ? All other components within normal limits  ?COMPREHENSIVE METABOLIC PANEL - Abnormal; Notable for the following components:  ? Glucose, Bld 212 (*)   ? All other components within normal limits  ?URINALYSIS, ROUTINE W REFLEX MICROSCOPIC - Abnormal; Notable for the following components:  ? Specific Gravity, Urine >1.046 (*)   ? Protein, ur 30 (*)   ? Leukocytes,Ua SMALL (*)   ? Bacteria, UA RARE (*)   ? All other components within normal limits  ?CBG MONITORING, ED - Abnormal; Notable for the following components:  ? Glucose-Capillary 231 (*)   ? All other components within normal limits  ?RESP PANEL BY RT-PCR (FLU A&B, COVID) ARPGX2  ?ETHANOL  ?PROTIME-INR  ?APTT  ?RAPID URINE DRUG SCREEN, HOSP PERFORMED  ? ?____________________________________________ ? ?EKG ? ? EKG Interpretation ? ?Date/Time:  Tuesday Feb 18 2022 15:53:14 EDT ?Ventricular Rate:  65 ?PR Interval:  136 ?QRS Duration: 93 ?QT Interval:  425 ?QTC Calculation: 442 ?R Axis:   66 ?Text Interpretation: Sinus rhythm Probable anteroseptal infarct, old Confirmed by Nanda Quinton [id number]) on 02/18/2022 4:08:29 PM ?  ? ?  ? ? ?____________________________________________ ? ?RADIOLOGY ? ?CT ANGIO HEAD NECK W WO CM ? ?Result Date: 02/18/2022 ?CLINICAL DATA:  Right-sided weakness, recent stroke EXAM: CT ANGIOGRAPHY HEAD AND NECK TECHNIQUE: Multidetector CT imaging of the head and neck was performed using the standard protocol during bolus administration of intravenous contrast. Multiplanar CT image reconstructions and MIPs were obtained to evaluate the vascular anatomy. Carotid stenosis measurements (when applicable) are obtained utilizing NASCET criteria, using the distal internal carotid diameter as the denominator. RADIATION DOSE REDUCTION: This exam was performed according to the departmental dose-optimization program which includes automated exposure control, adjustment of the mA and/or kV according to patient size and/or use of  iterative reconstruction technique. CONTRAST:  59mL OMNIPAQUE IOHEXOL 350 MG/ML SOLN COMPARISON:  01/30/2022 CTA head neck, correlation is also made with MRI 01/31/2022 FINDINGS: CT HEAD FINDINGS Brain: No evidence of acute infarction, hemorrhage, cerebral edema, mass, mass effect, or midline shift. No hydrocephalus or extra-axial fluid collection. Lacunar infarct in the left caudate, unchanged. Periventricular white matter changes, likely the sequela of chronic small vessel ischemic disease. Vascular: No hyperdense vessel. Skull: Normal. Negative for fracture or focal lesion. Sinuses/Orbits: Mucosal thickening in the right maxillary sinus. The orbits are unremarkable. Other: Fluid in the right mastoid air cells. CTA NECK FINDINGS Aortic arch: Standard branching. Imaged portion shows no evidence of aneurysm or dissection. No significant stenosis of the major arch vessel origins. Aortic atherosclerosis. 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 Right carotid system: No evidence of dissection, occlusion, or hemodynamically significant stenosis (greater than 50%). Calcified plaque at the bifurcation and in the proximal right ICA does not cause hemodynamically significant stenosis. Left carotid system: Redemonstrated severe stenosis of the proximal left ICA, unchanged from the prior exam, with string sign. No evidence of dissection. No other hemodynamically significant stenosis. Vertebral arteries: Severe stenosis at the origin of the right  vertebral artery, with multifocal moderate to severe stenosis throughout the V1 and V2 segments and redemonstrated critical stenosis at the level of C4. The remainder of the right V2 and V3 segment is patent but diminutive. The left vertebral artery is dominant and patent from its origin to the skull base. Skeleton: Degenerative changes in the cervical spine. No acute osseous abnormality. No visible canal hematoma. Other neck: No acute finding.  Redemonstrated poor dentition.  Upper chest: Incompletely imaged small nodular opacities in the anterior right lung (series 15, image [id number]). Review of the MIP images confirms the above findings CTA HEAD FINDINGS Anterior circulation: Both int

## 2022-02-18 NOTE — ED Triage Notes (Signed)
Pt c/o right sided weakness, unknown time that it started. PT was at pts house and pt had right sided weakness. Pt had a stroke a few weeks ago and has right sided weakness from prior stroke.  ?

## 2022-02-18 NOTE — ED Notes (Signed)
Patient transported to CT 

## 2022-02-26 NOTE — Progress Notes (Signed)
Patient arrived somnolent and altered compared to her baseline. EtOH and drug screening labs were ordered to help determine a reason for mental status change.  ?

## 2022-03-24 ENCOUNTER — Ambulatory Visit (HOSPITAL_COMMUNITY): Payer: Medicare HMO | Admitting: Speech Pathology

## 2022-03-25 ENCOUNTER — Encounter: Payer: Self-pay | Admitting: *Deleted

## 2022-03-26 ENCOUNTER — Encounter: Payer: Self-pay | Admitting: Diagnostic Neuroimaging

## 2022-03-26 ENCOUNTER — Ambulatory Visit: Payer: Medicare HMO | Admitting: Diagnostic Neuroimaging

## 2022-03-26 VITALS — BP 154/63 | HR 50 | Ht 64.0 in | Wt 216.0 lb

## 2022-03-26 DIAGNOSIS — I6339 Cerebral infarction due to thrombosis of other cerebral artery: Secondary | ICD-10-CM | POA: Diagnosis not present

## 2022-03-26 NOTE — Patient Instructions (Addendum)
  STROKE PREVENTION  - continue aspirin 81mg , plavix 75mg  daily, BP control, statin - may stop aspirin 81 in August 2023; continue plavix 75mg  daily (from stroke standpoint)  MEMORY LOSS (MMSE 21/30; no major changes in ADLs; likely MCI) - continue supportive care; caution with living alone - safety / supervision issues reviewed - daily physical activity / exercise (at least 15-30 minutes) - eat more plants / vegetables - increase social activities, brain stimulation, games, puzzles, hobbies, crafts, arts, music - aim for at least 7-8 hours sleep per night (or more) - avoid smoking and alcohol - caution with medications, finances; no driving (since September 2023)

## 2022-03-26 NOTE — Progress Notes (Signed)
[facility]  PATIENT: Carla [name] DOB: [birth year]  REFERRING CLINICIAN: Hyler, Janine Limbo, NP HISTORY FROM: patient REASON FOR VISIT: new consult   HISTORICAL  CHIEF COMPLAINT:  Chief Complaint  Patient presents with   Cerebrovascular Accident    Rm 6, Est pt, new issue  "just finished OT, still in PT; doing ok body-wise; my meory is worse"   Memory Loss    MMSE 21    HISTORY OF PRESENT ILLNESS:   UPDATE (03/26/22, [facility]): Since last visit, doing about the same, until April 2023. Found by friends confused. Taken to ER via EMS. Found to have increased WBC and also punctate strokes on MRI. Living alone. Manages her ADLs. No driving since [id number].   UPDATE 04/30/15: Since last visit, memory, HA, dizziness are worse. Now seeing pain mgmt, psychiatry, psychology.   PRIOR HPI (12/27/14): 71 year old female with arthritis, chronic pain, hypertension, COPD, tobacco abuse, here for evaluation of memory loss, forgetfulness, confusion events. Review of chart demonstrates multiple hospital evaluations in the past with associated significant psychosocial and situational stress, family members with substance abuse, as well as stressful events 2 years ago with death of patient's estranged brother and her husband. Patient denies any significant memory problems. She states that other people complain that she has forgetfulness confusion and memory problems. Patient's daughter is in the room today. She describes several episodes where the patient mistakes her grandson for her deceased estranged brother. Patient and her daughter show me several photographs of patients grandson, and the patient states that the person in the picture is her deceased brother. Apparently patient has been pointing to empty chairs and stating that her deceased brother is sitting there and questioning why he is in the house. There have been other episodes were patient stares off, speaks slowly, moves slowly, and does  not recognize her own family members. This may last for a few minutes or a few hours at a time. During this evaluation patient is tearful for the majority of evaluation. She denies depression. She states that she is sad about her deceased husband. She has never seen psychiatry or psychologist. She takes diazepam for anxiety. Patient does report stress related to grandchildren who live in the home who do not listen to her or help her. At times she feels helpless. Patient has significant chronic pain, knee pain, back pain, and had to go on to disability from her job in 2008 as a result of these pain issues. Patient was value for memory problems by PCP in October 2015. She was started on donepezil which caused diarrhea. She was switched to [facility] which also cause diarrhea. She stopped these medications.  REVIEW OF SYSTEMS: Full 14 system review of systems performed and negative with exception of: as per HPI.  ALLERGIES: Allergies  Allergen Reactions   Bee Venom Anaphylaxis   Coconut Flavor Anaphylaxis    Per patient   Mushroom Extract Complex Anaphylaxis    Extreme sweating, vomiting   Amitriptyline Other (See Comments)    Has nightmares when using, not in right state of mind    Toradol [Ketorolac Tromethamine] Nausea And Vomiting   Tramadol Nausea And Vomiting   Trazodone And Nefazodone Nausea And Vomiting    HOME MEDICATIONS: Outpatient Medications Prior to Visit  Medication Sig Dispense Refill   albuterol (PROVENTIL HFA;VENTOLIN HFA) 108 (90 BASE) MCG/ACT inhaler Inhale 2 puffs into the lungs every 6 (six) hours as needed for wheezing. 1 Inhaler 2   aspirin 81 MG chewable  tablet Chew 81 mg by mouth daily.     atorvastatin (LIPITOR) 80 MG tablet Take 1 tablet (80 mg total) by mouth daily at 6 PM. 30 tablet 6   benzonatate (TESSALON) 100 MG capsule Take 1 capsule (100 mg total) by mouth 3 (three) times daily as needed for cough. 21 capsule 0   budesonide-formoterol (SYMBICORT) 160-4.5  MCG/ACT inhaler Inhale 2 puffs into the lungs 2 (two) times daily.     clopidogrel (PLAVIX) 75 MG tablet Take 75 mg by mouth daily.     diphenhydrAMINE (BENADRYL) 25 mg capsule Take 50 mg by mouth every 6 (six) hours as needed.     furosemide (LASIX) 40 MG tablet Take 1 tablet (40 mg total) by mouth every Monday, Wednesday, and Friday. As needed for leg edema. 15 tablet 3   lisinopril (ZESTRIL) 10 MG tablet Take 10 mg by mouth daily.     lisinopril (ZESTRIL) 10 MG tablet Take 1 tablet by mouth daily.     methocarbamol (ROBAXIN) 500 MG tablet Take 1 tablet (500 mg total) by mouth 2 (two) times daily. 20 tablet 0   metoprolol tartrate (LOPRESSOR) 25 MG tablet Take 12.5 mg by mouth 2 (two) times daily.     nitroGLYCERIN (NITROSTAT) 0.4 MG SL tablet Place 1 tablet (0.4 mg total) under the tongue every 5 (five) minutes x 3 doses as needed for chest pain. 25 tablet 1   omeprazole (PRILOSEC) 20 MG capsule Take 1 capsule (20 mg total) by mouth daily. 30 capsule 0   diazepam (VALIUM) 5 MG tablet Take 1 tablet (5 mg total) by mouth 3 (three) times daily as needed for anxiety. 60 tablet 0   lisinopril (PRINIVIL,ZESTRIL) 5 MG tablet Take 1 tablet (5 mg total) by mouth daily. 30 tablet 3   hydrOXYzine (ATARAX) 25 MG tablet Take 25 mg by mouth 3 (three) times daily. (Patient not taking: Reported on 03/26/2022)     oxyCODONE-acetaminophen (PERCOCET) 10-325 MG tablet Take 1 tablet by mouth every 4 (four) hours as needed for pain. Max APAP 3gm/24 hrs from all sources (Patient not taking: Reported on 02/18/2022) 180 tablet 0   pantoprazole (PROTONIX) 20 MG tablet Take 20 mg by mouth daily.     potassium chloride (K-DUR) 10 MEQ tablet Take 1 tablet (10 mEq total) by mouth 2 (two) times daily. (Patient not taking: Reported on 02/18/2022) 30 tablet 0   ticagrelor (BRILINTA) 90 MG TABS tablet Take 1 tablet (90 mg total) by mouth 2 (two) times daily. (Patient not taking: Reported on 02/18/2022) 60 tablet 6   No  facility-administered medications prior to visit.    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   Dizziness    Frequent falls    GERD (gastroesophageal reflux disease)    Hypertension    Memory difficulty 2015   Memory loss    Migraine    "4-5/year" (08/19/2016); "@ least once/month now" (05/25/2017)   Myocardial infarction ([facility]) 08/18/2016   Neuropathy    Pain management    Pneumonia "several times"   Pre-diabetes  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   "likely related to situational and psychosocial stress, depression, pain"   Shortness of breath dyspnea    with exertion   Spinal stenosis, lumbar region, 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   Weakness of both legs     PAST SURGICAL HISTORY: Past Surgical History:  Procedure Laterality Date   APPENDECTOMY  1971   BACK SURGERY     CARDIAC CATHETERIZATION N/A 08/19/2016   Procedure: Left Heart Cath and Coronary Angiography;  Surgeon: Orpah Cobb, MD;  Location: [facility] INVASIVE CV LAB;  Service: Cardiovascular;  Laterality: N/A;   CARDIAC CATHETERIZATION N/A 08/19/2016   Procedure: Coronary Stent Intervention;  Surgeon: Yates Decamp, MD;  Location: [facility] CV LAB;  Service: Cardiovascular;  Laterality: N/A;   CHOLECYSTECTOMY N/A 03/12/2016   Procedure: LAPAROSCOPIC CHOLECYSTECTOMY;  Surgeon: Ancil Linsey, MD;  Location: [facility];  Service: General;  Laterality: N/A;   KNEE ARTHROSCOPY Right 2007   LUMBAR FUSION  05/2016   L4-L5 decompression and fusion    TONSILLECTOMY  1971    FAMILY HISTORY: Family History  Problem Relation Age of Onset   Heart failure Father    Diabetes Father    Kidney  failure Father     SOCIAL HISTORY: Social History   Socioeconomic History   Marital status: Widowed    Spouse name: Not on file   Number of children: 2   Years of education: 10   Highest education level: Not on file  Occupational History   Occupation: Retired Statistician and [employer]  Tobacco Use   Smoking status: Every Day    Packs/day: 0.50    Years: 40.00    Total pack years: 20.00    Types: Cigarettes    Start date: 08/05/1970   Smokeless tobacco: Never  Vaping Use   Vaping Use: Never used  Substance and Sexual Activity   Alcohol use: Yes    Alcohol/week: 0.0 standard drinks of alcohol    Comment: 05/25/2017  "sip on NYE; if that"   Drug use: No   Sexual activity: Never  Other Topics Concern   Not on file  Social History Narrative   03/26/22 Lives alone   Right and left handed   Caffeine use: very rare .Has coke 2 times a week.   Multiple uncles and aunts on her father's side have heart disease.   Social Determinants of Health   Financial Resource Strain: Not on file  Food Insecurity: Not on file  Transportation Needs: Not on file  Physical Activity: Not on file  Stress: Not on file  Social Connections: Not on file  Intimate Partner Violence: Not on file     PHYSICAL EXAM  GENERAL EXAM/CONSTITUTIONAL: Vitals:  Vitals:   03/26/22 0916  BP: (!) 154/63  Pulse: (!) 50  Weight: 216 lb (98 kg)  Height: 5\' 4"  (1.626 m)   Body mass index is 37.08 kg/m. Wt Readings from Last 3 Encounters:  03/26/22 216 lb (98 kg)  02/18/22 145 lb 1 oz (65.8 kg)  12/28/17 145 lb (65.8 kg)   Patient is in no distress; well developed, nourished and groomed; neck is supple  CARDIOVASCULAR: Examination of carotid arteries is normal; no carotid bruits Regular rate and rhythm, no murmurs Examination of peripheral vascular system by observation and palpation is normal  EYES: Ophthalmoscopic exam of optic discs and posterior segments is normal; no papilledema or hemorrhages No  results found.  MUSCULOSKELETAL: Gait, strength, tone, movements noted in Neurologic exam below  NEUROLOGIC: MENTAL STATUS:  03/26/2022    9:23 AM 04/30/2015    1:23 PM  MMSE - Mini Mental State Exam  Orientation to time 3 3  Orientation to Place 1 2  Registration 2 3  Attention/ Calculation 4 2  Recall 3 1  Language- name 2 objects 1 2  Language- repeat 1 1  Language- follow 3 step command 3 2  Language- read & follow direction 1 1  Write a sentence 1 1  Copy design 1 1  Total score 21 19   awake, alert, oriented to person, place and time recent and remote memory intact normal attention and concentration language fluent, comprehension intact, naming intact fund of knowledge appropriate  CRANIAL NERVE:  2nd - no papilledema on fundoscopic exam 2nd, 3rd, 4th, 6th - pupils equal and reactive to light, visual fields full to confrontation, extraocular muscles intact, no nystagmus 5th - facial sensation symmetric 7th - facial strength symmetric 8th - hearing intact 9th - palate elevates symmetrically, uvula midline 11th - shoulder shrug symmetric 12th - tongue protrusion midline  MOTOR:  normal bulk and tone, full strength in the BUE, BLE; SLIGHTLY LIMITED IN RIGHT ARM  SENSORY:  normal and symmetric to light touch, temperature, vibration  COORDINATION:  finger-nose-finger, fine finger movements normal  REFLEXES:  deep tendon reflexes TRACE and symmetric  GAIT/STATION:  narrow based gait; USING WALKER     DIAGNOSTIC DATA (LABS, IMAGING, TESTING) - I reviewed patient records, labs, notes, testing and imaging myself where available.  Lab Results  Component Value Date   WBC 9.9 02/18/2022   HGB 16.1 (H) 02/18/2022   HCT 50.7 (H) 02/18/2022   MCV 90.7 02/18/2022   PLT 245 02/18/2022      Component Value Date/Time   NA 136 02/18/2022 1550   K 3.9 02/18/2022 1550   CL 101 02/18/2022 1550   CO2 25 02/18/2022 1550   GLUCOSE 212 (H) 02/18/2022 1550    BUN 22 02/18/2022 1550   CREATININE 0.76 02/18/2022 1550   CALCIUM 9.6 02/18/2022 1550   PROT 7.9 02/18/2022 1550   ALBUMIN 3.8 02/18/2022 1550   AST 22 02/18/2022 1550   ALT 21 02/18/2022 1550   ALKPHOS 101 02/18/2022 1550   BILITOT 0.4 02/18/2022 1550   GFRNONAA >60 02/18/2022 1550   GFRAA >60 09/23/2017 0859   Lab Results  Component Value Date   CHOL 164 08/20/2016   HDL 36 (L) 08/20/2016   LDLCALC 99 08/20/2016   TRIG 144 08/20/2016   CHOLHDL 4.6 08/20/2016   Lab Results  Component Value Date   HGBA1C 6.5 (H) 03/07/2016   Lab Results  Component Value Date   VITAMINB12 315 06/29/2015   Lab Results  Component Value Date   TSH 3.083 03/10/2016    02/18/22 CTA head / neck 1.  No acute intracranial process. 2. Overall unchanged CTA compared to 01/30/2022, with critical stenosis in the proximal left ICA, severe stenosis at the right vertebral artery origin and in the mid right V2 segment, poor perfusion of the right V4 segment, severe stenosis of the left ACA, moderate stenosis in the supraclinoid right intracranial ICA, and additional mild-to-moderate atherosclerotic irregularity and narrowing, as described above. 3. Small nodular opacities in the anterior right lung, which are incompletely imaged given the field of view, which could be infectious or inflammatory. Consider chest CT for further evaluation.  02/18/22 MRI brain / MRA head No acute infarction. Extensive chronic small-vessel ischemic changes throughout the brain as outlined above.   No anterior  circulation large vessel occlusion. Widespread atherosclerotic irregularity of the distal branch vessels.   No antegrade flow in the right vertebral artery. No large vessel posterior circulation occlusion. Atherosclerotic irregularity of the more distal PCA branches.    ASSESSMENT AND PLAN  71 y.o. year old female here with:   Dx:  1. Cerebrovascular accident (CVA) due to thrombosis of other cerebral  artery (HCC)      PLAN:  STROKE PREVENTION (likely small vessel dz; HTN, DM, hyperlipidemia) - continue aspirin 81mg , plavix 75mg  daily, BP control, statin; monitor DM per PCP - may stop aspirin 81 in August 2023; continue plavix 75mg  daily  MEMORY LOSS (MMSE 21/30; no major changes in ADLs; likely MCI; do not suspect dementia) - continue supportive care; caution with living alone - safety / supervision issues reviewed - daily physical activity / exercise (at least 15-30 minutes) - eat more plants / vegetables - increase social activities, brain stimulation, games, puzzles, hobbies, crafts, arts, music - aim for at least 7-8 hours sleep per night (or more) - avoid smoking and alcohol - caution with medications, finances; no driving (since 0600)  Return for return to PCP, pending if symptoms worsen or fail to improve.    Suanne Marker, MD 03/26/2022, 10:42 AM Certified in Neurology, Neurophysiology and Neuroimaging  [facility] [address] [place], [place] [address] [phone]

## 2022-03-27 ENCOUNTER — Encounter (HOSPITAL_COMMUNITY): Payer: Self-pay

## 2022-03-27 ENCOUNTER — Ambulatory Visit (HOSPITAL_COMMUNITY): Payer: Medicare HMO | Attending: Nurse Practitioner | Admitting: Speech Pathology

## 2022-05-13 ENCOUNTER — Encounter: Payer: Self-pay | Admitting: *Deleted

## 2022-05-16 ENCOUNTER — Institutional Professional Consult (permissible substitution): Payer: Medicare HMO | Admitting: Diagnostic Neuroimaging

## 2022-07-07 ENCOUNTER — Other Ambulatory Visit: Payer: Self-pay | Admitting: *Deleted

## 2022-07-07 DIAGNOSIS — I6529 Occlusion and stenosis of unspecified carotid artery: Secondary | ICD-10-CM

## 2022-07-16 ENCOUNTER — Other Ambulatory Visit: Payer: Self-pay

## 2022-07-16 ENCOUNTER — Ambulatory Visit: Payer: Medicare HMO | Admitting: Vascular Surgery

## 2022-07-16 ENCOUNTER — Encounter: Payer: Self-pay | Admitting: Vascular Surgery

## 2022-07-16 ENCOUNTER — Ambulatory Visit (INDEPENDENT_AMBULATORY_CARE_PROVIDER_SITE_OTHER): Payer: Medicare HMO

## 2022-07-16 VITALS — BP 116/63 | HR 50 | Temp 97.2°F | Ht 64.0 in | Wt 214.0 lb

## 2022-07-16 DIAGNOSIS — I6529 Occlusion and stenosis of unspecified carotid artery: Secondary | ICD-10-CM

## 2022-07-16 NOTE — Progress Notes (Signed)
[facility] [place]  Patient name: Carla [name] MRN: [id number] DOB: Dec 07, 1950 Sex: female  REASON FOR CONSULT: Evaluation of carotid stenosis  HPI: Carla [name] is a 71 y.o. 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  She has a very complicated past history.  She reports that in April 2023 she had a stroke affecting her speech and memory.  Apparently she has no memory of her children and grandchildren.  He does report right-sided weakness following this as well.  She reports that she has had multiple episodes of small strokes since this time.  Her work-up around the time of her initial event included CT angiogram on 02/16/2022.  This did show critical left internal carotid artery stenosis.  She walks with a rolling walker.  Does have some clumsiness in her right hand versus her left.  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   Dizziness    Frequent falls    GERD (gastroesophageal reflux disease)    Hypertension    Memory difficulty 2015   Memory loss    Migraine    "4-5/year" (08/19/2016); "@ least once/month now" (05/25/2017)   Myocardial infarction (HCC) 08/18/2016   Neuropathy    Pain management    Pneumonia "several times"   Pre-diabetes    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   "likely related to situational and psychosocial stress, depression, pain"   Shortness of breath dyspnea    with exertion   Spinal stenosis, lumbar region, 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   Weakness of both legs     Family History  Problem Relation Age of Onset   Heart failure  Father    Diabetes Father    Kidney failure Father     SOCIAL HISTORY: Social History   Socioeconomic History   Marital status: Widowed    Spouse name: Not on file   Number of children: 2   Years of education: 43   Highest education level: Not on file  Occupational History   Occupation: Retired Statistician and [employer]  Tobacco Use   Smoking status: Every Day    Packs/day: 0.50    Years: 40.00    Total pack years: 20.00    Types: Cigarettes    Start date: 08/05/1970   Smokeless tobacco: Never  Vaping Use   Vaping Use: Never used  Substance and Sexual Activity   Alcohol use: Yes    Alcohol/week: 0.0 standard drinks of alcohol    Comment: 05/25/2017  "sip on NYE; if that"   Drug use: No   Sexual activity: Never  Other Topics Concern   Not on file  Social History Narrative   03/26/22 Lives alone   Right and left handed   Caffeine use: very rare .Has coke 2 times a week.   Multiple uncles and aunts on her father's side have heart disease.   Social Determinants of Health   Financial Resource Strain: Not on file  Food Insecurity: Not on file  Transportation Needs: Not on file  Physical Activity: Not on file  Stress: Not  on file  Social Connections: Not on file  Intimate Partner Violence: Not on file    Allergies  Allergen Reactions   Bee Venom Anaphylaxis   Coconut Flavor Anaphylaxis    Per patient   Mushroom Extract Complex Anaphylaxis    Extreme sweating, vomiting   Amitriptyline Other (See Comments)    Has nightmares when using, not in right state of mind    Toradol [Ketorolac Tromethamine] Nausea And Vomiting   Tramadol Nausea And Vomiting   Trazodone And Nefazodone Nausea And Vomiting    Current Outpatient Medications  Medication Sig Dispense Refill   albuterol (PROVENTIL HFA;VENTOLIN HFA) 108 (90 BASE) MCG/ACT inhaler Inhale 2 puffs into the lungs every 6 (six) hours as needed for wheezing. 1 Inhaler 2   aspirin 81 MG chewable tablet Chew 81 mg by mouth  daily.     atorvastatin (LIPITOR) 80 MG tablet Take 1 tablet (80 mg total) by mouth daily at 6 PM. 30 tablet 6   benzonatate (TESSALON) 100 MG capsule Take 1 capsule (100 mg total) by mouth 3 (three) times daily as needed for cough. 21 capsule 0   budesonide-formoterol (SYMBICORT) 160-4.5 MCG/ACT inhaler Inhale 2 puffs into the lungs 2 (two) times daily.     chlorthalidone (HYGROTON) 25 MG tablet Take 25 mg by mouth daily.     clopidogrel (PLAVIX) 75 MG tablet Take 75 mg by mouth daily.     diphenhydrAMINE (BENADRYL) 25 mg capsule Take 50 mg by mouth every 6 (six) hours as needed.     DULoxetine (CYMBALTA) 60 MG capsule Take 60 mg by mouth daily.     furosemide (LASIX) 40 MG tablet Take 1 tablet (40 mg total) by mouth every Monday, Wednesday, and Friday. As needed for leg edema. 15 tablet 3   levothyroxine (SYNTHROID) 25 MCG tablet Take 25 mcg by mouth daily.     lisinopril (ZESTRIL) 20 MG tablet Take 20 mg by mouth daily.     methocarbamol (ROBAXIN) 500 MG tablet Take 1 tablet (500 mg total) by mouth 2 (two) times daily. 20 tablet 0   metoprolol succinate (TOPROL-XL) 25 MG 24 hr tablet Take 25 mg by mouth daily.     nitroGLYCERIN (NITROSTAT) 0.4 MG SL tablet Place 1 tablet (0.4 mg total) under the tongue every 5 (five) minutes x 3 doses as needed for chest pain. 25 tablet 1   pantoprazole (PROTONIX) 20 MG tablet Take 20 mg by mouth daily.     No current facility-administered medications for this visit.    REVIEW OF SYSTEMS:  [X]  denotes positive finding, [ ]  denotes negative finding Cardiac  Comments:  Chest pain or chest pressure: x   Shortness of breath upon exertion: x   Short of breath when lying flat:    Irregular heart rhythm: x       Vascular    Pain in calf, thigh, or hip brought on by ambulation: x   Pain in feet at night that wakes you up from your sleep:  x   Blood clot in your veins:    Leg swelling:         Pulmonary    Oxygen at home:    Productive cough:      Wheezing:         Neurologic    Sudden weakness in arms or legs:  x   Sudden numbness in arms or legs:  x   Sudden onset of difficulty speaking or slurred speech: x   Temporary  loss of vision in one eye:  x   Problems with dizziness:  x       Gastrointestinal    Blood in stool:     Vomited blood:         Genitourinary    Burning when urinating:     Blood in urine:        Psychiatric    Major depression:         Hematologic    Bleeding problems:    Problems with blood clotting too easily:        Skin    Rashes or ulcers:        Constitutional    Fever or chills:      PHYSICAL EXAM: Vitals:   07/16/22 1235 07/16/22 1244  BP: (!) 99/51 116/63  Pulse: (!) 50   Temp: (!) 97.2 F (36.2 C)   Weight: 214 lb (97.1 kg)   Height: 5\' 4"  (1.626 m)     GENERAL: The patient is a well-nourished female, in no acute distress. The vital signs are documented above. CARDIOVASCULAR: Do not appreciate carotid bruits.  She has 2+ radial pulses bilaterally PULMONARY: There is good air exchange  MUSCULOSKELETAL: There are no major deformities or cyanosis. NEUROLOGIC: No focal weakness or paresthesias are detected. SKIN: There are no ulcers or rashes noted. PSYCHIATRIC: The patient has a normal affect.  DATA:  Duplex today reveals patency of her left internal carotid artery with a critical proximal stenosis  MEDICAL ISSUES: Had a long discussion with the patient.  Explained that she does have critical left internal carotid artery stenosis.  Her logic deficits included difficulty with speech and memory and right-sided weakness.  MRI at the time of her initial presentation did not show acute stroke.  I have recommended left endarterectomy for reduction of stroke risk.  She had been told that this may improve her memory.  I explained that unfortunately this would be exceedingly unlikely and that the goal would be for reduction of future stroke risk.  I described the procedure in detail  including expected 1 night hospitalization.  Also explained the 1-1 and half percent risk of stroke with surgery.  Patient understands and wishes to proceed.  We will schedule this through our [facility] office with Dr. Melene Muller at her convenience   Rosetta Posner, MD [facility] Tel [phone] Pager [phone]  Note: Portions of this report may have been transcribed using voice recognition software.  Every effort has been made to ensure accuracy; however, inadvertent computerized transcription errors may still be present.

## 2022-07-23 ENCOUNTER — Other Ambulatory Visit: Payer: Self-pay

## 2022-07-23 ENCOUNTER — Encounter (HOSPITAL_COMMUNITY): Payer: Self-pay | Admitting: Vascular Surgery

## 2022-07-23 NOTE — Progress Notes (Signed)
PCP - Blenda Nicely, NP,  Dr Burr Medico as of 07/21/22 Cardiologist - Cathie Hoops, DO Neurology - Dr Thamas Jaegers  Chest x-ray - 02/18/22, 04/16/22 CE EKG - 02/18/22, 06/25/24 CE & copy in chart Stress Test - 05/23/22 CE ECHO - 01/31/22 CE Cardiac Cath - 08/19/16  ICD Pacemaker/Loop - n/a  Sleep Study - n/a CPAP - none  Aspirin/Blood Thinner Instructions:  Follow your surgeon's instructions on when to stop Aspirin/Plavix prior to surgery. Ok to take on DOS per Kia at MD's office. .  Anesthesia review: Yes  STOP now taking any Aspirin (unless otherwise instructed by your surgeon), Aleve, Naproxen, Ibuprofen, Motrin, Advil, Goody's, BC's, all herbal medications, fish oil, and all vitamins.   Coronavirus Screening Do you have any of the following symptoms:  Cough yes/no: No Fever (>100.81F)  yes/no: No Runny nose yes/no: No Sore throat yes/no: No Difficulty breathing/shortness of breath  yes/no: No  Have you traveled in the last 14 days and where? yes/no: No  Patient verbalized understanding of instructions that were given via phone.

## 2022-07-23 NOTE — Anesthesia Preprocedure Evaluation (Addendum)
Anesthesia Evaluation  Patient identified by MRN, date of birth, ID band Patient awake    Reviewed: Allergy & Precautions, H&P , NPO status , Patient's Chart, lab work & pertinent test results  Airway Mallampati: II   Neck ROM: full    Dental   Pulmonary shortness of breath, asthma , COPD, Current Smoker,    breath sounds clear to auscultation       Cardiovascular hypertension, + angina + Past MI   Rhythm:regular Rate:Normal     Neuro/Psych  Headaches, PSYCHIATRIC DISORDERS Anxiety Depression CVA    GI/Hepatic GERD  ,  Endo/Other    Renal/GU      Musculoskeletal  (+) Arthritis ,   Abdominal   Peds  Hematology   Anesthesia Other Findings   Reproductive/Obstetrics                            Anesthesia Physical Anesthesia Plan  ASA: 3  Anesthesia Plan: General   Post-op Pain Management:    Induction: Intravenous  PONV Risk Score and Plan: 2 and Ondansetron, Dexamethasone, Treatment may vary due to age or medical condition and Midazolam  Airway Management Planned: Oral ETT  Additional Equipment: Arterial line  Intra-op Plan:   Post-operative Plan: Extubation in OR  Informed Consent: I have reviewed the patients History and Physical, chart, labs and discussed the procedure including the risks, benefits and alternatives for the proposed anesthesia with the patient or authorized representative who has indicated his/her understanding and acceptance.     Dental advisory given  Plan Discussed with: CRNA, Anesthesiologist and Surgeon  Anesthesia Plan Comments: (PAT note by Karoline Caldwell, PA-C: Follows with cardiologist Dr. Candis Musa at [facility] for history of CAD with prior NSTEMI 2017 treated with PCI/DES to proximal LAD, chronic chest pain, HTN, HLD, CVA 01/2022 associated critical left ICA stenosis.  Recent nuclear stress 05/2022 was low risk.  Event monitor 05/2022 showed no sustained arrhythmia  or prolonged pauses.  Last seen by Dr. Candis Musa 06/25/2022 and noted to be doing better from CV standpoint.  Per note, "Interval history: She reports feeling much better now after changes in medications. She still has chest discomfort but not as intense as before, she has not had to use sublingual nitroglycerin as of yet. She is able to be active at home and without much limitation at this time. Lower extremity edema is much improved since starting chlorthalidone. She has been taking medications as directed and her blood pressure is much improved. We reviewed her blood pressure diary today and shows relatively well-controlled blood pressure with systolic mostly in the 062I. Blood pressure in the office today is marginally low but she is asymptomatic. She reports being down to 3 to 4 cigarettes/day and trying to quit. She has upcoming evaluation with vascular through [facility] system on October 4."  She was recommended to follow-up in 6 months.  Current smoker with associated COPD.  Suspected OSA.  Patient will need day of surgery labs and evaluation.  EKG 06/25/2022: Sinus bradycardia.  Anteroseptal MI.  Rate 54.  Carotid duplex 07/16/2022: Summary:  Right Carotid: Velocities in the right ICA are consistent with a 1-39% stenosis. End diastolic velocity is at the 40-59% threshold. This may represent compensatory flow.  Left Carotid: Velocities in the left ICA are consistent with a 80-99% stenosis. The ECA appears >50% stenosed.  Vertebrals: Bilateral vertebral arteries demonstrate antegrade flow.  Subclavians: Normal flow hemodynamics were seen in bilateral subclavian arteries.  Nuclear stress 05/23/2022 (Care Everywhere): 1. No reversible ischemia or infarction.   2. Normal left ventricular wall motion.   3. Left ventricular ejection fraction 75%   4. Non invasive risk stratification*: Low    Event monitor 05/29/2022 (Care Everywhere): This result has an attachment that is not available.  Patient had  a min HR of 45 bpm, max HR of 203 bpm, and avg HR of 62 bpm.  Predominant underlying rhythm was Sinus Rhythm. 33 Supraventricular  Tachycardia runs occurred, the run with the fastest interval lasting 13  beats with a max rate of 203 bpm, the longest lasting 18 beats with an avg  rate of 116 bpm. Supraventricular Tachycardia was detected within +/- 45  seconds of symptomatic patient event(s). Isolated SVEs were occasional  (3.5%, [id number]), SVE Couplets were rare (<1.0%, 527), and SVE Triplets were  rare (<1.0%, 122). Isolated VEs were rare (<1.0%, 1708), VE Triplets were  rare (<1.0%, 1), and no VE Couplets were present. Ventricular Bigeminy was  present.   Agree with findings. No sustained arrhythmia or prolonged pauses.  TTE 01/31/2022: Summary  1. The left ventricle is normal in size with normal wall thickness.  2. The left ventricular systolic function is normal, LVEF is visually  estimated at 60-65%.  3. The aortic valve is trileaflet with mildly thickened leaflets with normal  excursion.  4. The left atrium is mildly to moderately dilated in size.  5. The right ventricle is normal in size, with normal systolic function.  6. Agitated saline study is negative.   )       Anesthesia Quick Evaluation

## 2022-07-23 NOTE — Progress Notes (Signed)
Anesthesia Chart Review: Same-day work-up  Follows with cardiologist Dr. Candis Musa at [facility] for history of CAD with prior NSTEMI 2017 treated with PCI/DES to proximal LAD, chronic chest pain, HTN, HLD, CVA 01/2022 associated critical left ICA stenosis.  Recent nuclear stress 05/2022 was low risk.  Event monitor 05/2022 showed no sustained arrhythmia or prolonged pauses.  Last seen by Dr. Candis Musa 06/25/2022 and noted to be doing better from CV standpoint.  Per note, "Interval history: She reports feeling much better now after changes in medications. She still has chest discomfort but not as intense as before, she has not had to use sublingual nitroglycerin as of yet. She is able to be active at home and without much limitation at this time. Lower extremity edema is much improved since starting chlorthalidone. She has been taking medications as directed and her blood pressure is much improved. We reviewed her blood pressure diary today and shows relatively well-controlled blood pressure with systolic mostly in the 732K. Blood pressure in the office today is marginally low but she is asymptomatic. She reports being down to 3 to 4 cigarettes/day and trying to quit. She has upcoming evaluation with vascular through [facility] system on October 4."  She was recommended to follow-up in 6 months.  Current smoker with associated COPD.  Suspected OSA.  Patient will need day of surgery labs and evaluation.  EKG 06/25/2022: Sinus bradycardia.  Anteroseptal MI.  Rate 54.  Carotid duplex 07/16/2022: Summary:  Right Carotid: Velocities in the right ICA are consistent with a 1-39% stenosis. End diastolic velocity is at the 40-59% threshold. This may represent compensatory flow.  Left Carotid: Velocities in the left ICA are consistent with a 80-99% stenosis. The ECA appears >50% stenosed.  Vertebrals:  Bilateral vertebral arteries demonstrate antegrade flow.  Subclavians: Normal flow hemodynamics were seen in bilateral subclavian  arteries.   Nuclear stress 05/23/2022 (Care Everywhere): 1. No reversible ischemia or infarction.   2. Normal left ventricular wall motion.   3. Left ventricular ejection fraction 75%   4. Non invasive risk stratification*: Low    Event monitor 05/29/2022 (Care Everywhere): This result has an attachment that is not available.  Patient had a min HR of 45 bpm, max HR of 203 bpm, and avg HR of 62 bpm.  Predominant underlying rhythm was Sinus Rhythm. 33 Supraventricular  Tachycardia runs occurred, the run with the fastest interval lasting 13  beats with a max rate of 203 bpm, the longest lasting 18 beats with an avg  rate of 116 bpm. Supraventricular Tachycardia was detected within +/- 45  seconds of symptomatic patient event(s). Isolated SVEs were occasional  (3.5%, [id number]), SVE Couplets were rare (<1.0%, 527), and SVE Triplets were  rare (<1.0%, 122). Isolated VEs were rare (<1.0%, 1708), VE Triplets were  rare (<1.0%, 1), and no VE Couplets were present. Ventricular Bigeminy was  present.   Agree with findings.  No sustained arrhythmia or prolonged pauses.  TTE 01/31/2022: Summary    1. The left ventricle is normal in size with normal wall thickness.    2. The left ventricular systolic function is normal, LVEF is visually  estimated at 60-65%.    3. The aortic valve is trileaflet with mildly thickened leaflets with normal  excursion.    4. The left atrium is mildly to moderately dilated in size.    5. The right ventricle is normal in size, with normal systolic function.    6. Agitated saline study is negative.    Jeneen Rinks  Sampson Goon [facility] Short Stay Center/Anesthesiology Phone [phone] 07/23/2022 11:23 AM

## 2022-07-24 ENCOUNTER — Inpatient Hospital Stay (HOSPITAL_COMMUNITY): Payer: Medicare HMO | Admitting: Physician Assistant

## 2022-07-24 ENCOUNTER — Inpatient Hospital Stay (HOSPITAL_COMMUNITY)
Admission: RE | Admit: 2022-07-24 | Discharge: 2022-07-26 | DRG: 038 | Disposition: A | Discharge status: Home / Self Care | Payer: Medicare HMO | Attending: Vascular Surgery | Admitting: Vascular Surgery

## 2022-07-24 ENCOUNTER — Encounter (HOSPITAL_COMMUNITY): Payer: Self-pay | Admitting: Vascular Surgery

## 2022-07-24 ENCOUNTER — Encounter (HOSPITAL_COMMUNITY)
Admission: RE | Disposition: A | Discharge status: Home / Self Care | Payer: Self-pay | Source: Home / Self Care | Attending: Vascular Surgery

## 2022-07-24 ENCOUNTER — Other Ambulatory Visit: Payer: Self-pay

## 2022-07-24 ENCOUNTER — Inpatient Hospital Stay (HOSPITAL_COMMUNITY): Payer: Medicare HMO

## 2022-07-24 DIAGNOSIS — I63232 Cerebral infarction due to unspecified occlusion or stenosis of left carotid arteries: Secondary | ICD-10-CM

## 2022-07-24 DIAGNOSIS — I252 Old myocardial infarction: Secondary | ICD-10-CM | POA: Diagnosis not present

## 2022-07-24 DIAGNOSIS — I10 Essential (primary) hypertension: Secondary | ICD-10-CM | POA: Diagnosis present

## 2022-07-24 DIAGNOSIS — F17210 Nicotine dependence, cigarettes, uncomplicated: Secondary | ICD-10-CM

## 2022-07-24 DIAGNOSIS — I6522 Occlusion and stenosis of left carotid artery: Secondary | ICD-10-CM | POA: Diagnosis present

## 2022-07-24 DIAGNOSIS — J4489 Other specified chronic obstructive pulmonary disease: Secondary | ICD-10-CM | POA: Diagnosis present

## 2022-07-24 DIAGNOSIS — Z9109 Other allergy status, other than to drugs and biological substances: Secondary | ICD-10-CM

## 2022-07-24 DIAGNOSIS — I69351 Hemiplegia and hemiparesis following cerebral infarction affecting right dominant side: Secondary | ICD-10-CM | POA: Diagnosis not present

## 2022-07-24 DIAGNOSIS — K219 Gastro-esophageal reflux disease without esophagitis: Secondary | ICD-10-CM | POA: Diagnosis present

## 2022-07-24 DIAGNOSIS — Z833 Family history of diabetes mellitus: Secondary | ICD-10-CM | POA: Diagnosis not present

## 2022-07-24 DIAGNOSIS — J449 Chronic obstructive pulmonary disease, unspecified: Secondary | ICD-10-CM | POA: Diagnosis not present

## 2022-07-24 DIAGNOSIS — Z8249 Family history of ischemic heart disease and other diseases of the circulatory system: Secondary | ICD-10-CM

## 2022-07-24 DIAGNOSIS — Z885 Allergy status to narcotic agent status: Secondary | ICD-10-CM

## 2022-07-24 DIAGNOSIS — Z888 Allergy status to other drugs, medicaments and biological substances status: Secondary | ICD-10-CM | POA: Diagnosis not present

## 2022-07-24 DIAGNOSIS — I471 Supraventricular tachycardia, unspecified: Secondary | ICD-10-CM | POA: Diagnosis present

## 2022-07-24 DIAGNOSIS — Z841 Family history of disorders of kidney and ureter: Secondary | ICD-10-CM

## 2022-07-24 DIAGNOSIS — G629 Polyneuropathy, unspecified: Secondary | ICD-10-CM | POA: Diagnosis present

## 2022-07-24 DIAGNOSIS — F419 Anxiety disorder, unspecified: Secondary | ICD-10-CM | POA: Diagnosis present

## 2022-07-24 DIAGNOSIS — I6529 Occlusion and stenosis of unspecified carotid artery: Secondary | ICD-10-CM

## 2022-07-24 HISTORY — PX: ENDARTERECTOMY: SHX5162

## 2022-07-24 HISTORY — DX: Dependence on other enabling machines and devices: Z99.89

## 2022-07-24 HISTORY — PX: PATCH ANGIOPLASTY: SHX6230

## 2022-07-24 LAB — CBC
HCT: 47.7 % — ABNORMAL HIGH (ref 36.0–46.0)
Hemoglobin: 16 g/dL — ABNORMAL HIGH (ref 12.0–15.0)
MCH: 30.4 pg (ref 26.0–34.0)
MCHC: 33.5 g/dL (ref 30.0–36.0)
MCV: 90.5 fL (ref 80.0–100.0)
Platelets: 276 10*3/uL (ref 150–400)
RBC: 5.27 MIL/uL — ABNORMAL HIGH (ref 3.87–5.11)
RDW: 13.7 % (ref 11.5–15.5)
WBC: 12.9 10*3/uL — ABNORMAL HIGH (ref 4.0–10.5)
nRBC: 0 % (ref 0.0–0.2)

## 2022-07-24 LAB — SURGICAL PCR SCREEN
MRSA, PCR: NEGATIVE
Staphylococcus aureus: NEGATIVE

## 2022-07-24 LAB — COMPREHENSIVE METABOLIC PANEL
ALT: 28 U/L (ref 0–44)
AST: 32 U/L (ref 15–41)
Albumin: 3.6 g/dL (ref 3.5–5.0)
Alkaline Phosphatase: 81 U/L (ref 38–126)
Anion gap: 12 (ref 5–15)
BUN: 26 mg/dL — ABNORMAL HIGH (ref 8–23)
CO2: 25 mmol/L (ref 22–32)
Calcium: 9.4 mg/dL (ref 8.9–10.3)
Chloride: 99 mmol/L (ref 98–111)
Creatinine, Ser: 0.93 mg/dL (ref 0.44–1.00)
GFR, Estimated: >60 mL/min (ref >60)
Glucose, Bld: 139 mg/dL — ABNORMAL HIGH (ref 70–99)
Potassium: 4.9 mmol/L (ref 3.5–5.1)
Sodium: 136 mmol/L (ref 135–145)
Total Bilirubin: 1.2 mg/dL (ref 0.3–1.2)
Total Protein: 6.7 g/dL (ref 6.5–8.1)

## 2022-07-24 LAB — URINALYSIS, ROUTINE W REFLEX MICROSCOPIC
Bilirubin Urine: NEGATIVE
Glucose, UA: NEGATIVE mg/dL
Hgb urine dipstick: NEGATIVE
Ketones, ur: NEGATIVE mg/dL
Nitrite: NEGATIVE
Protein, ur: NEGATIVE mg/dL
Specific Gravity, Urine: 1.025 (ref 1.005–1.030)
pH: 5 (ref 5.0–8.0)

## 2022-07-24 LAB — APTT: aPTT: 27 seconds (ref 24–36)

## 2022-07-24 LAB — POCT ACTIVATED CLOTTING TIME
Activated Clotting Time: 341 seconds
Activated Clotting Time: 287 seconds

## 2022-07-24 LAB — TYPE AND SCREEN
ABO/RH(D): A POS
Antibody Screen: NEGATIVE

## 2022-07-24 LAB — PROTIME-INR
INR: 1 (ref 0.8–1.2)
Prothrombin Time: 13.3 seconds (ref 11.4–15.2)

## 2022-07-24 SURGERY — ENDARTERECTOMY, CAROTID
Anesthesia: General | Site: Neck | Laterality: Left

## 2022-07-24 MED ORDER — SODIUM CHLORIDE 0.9 % IV BOLUS: 500.0000 mL | Freq: Once | INTRAVENOUS | Status: DC

## 2022-07-24 MED ORDER — EPHEDRINE 5 MG/ML INJ
INTRAVENOUS | Status: AC
  Filled 2022-07-24: qty 5

## 2022-07-24 MED ORDER — DEXAMETHASONE SODIUM PHOSPHATE 4 MG/ML IJ SOLN
INTRAMUSCULAR | Status: AC
  Filled 2022-07-24: qty 2

## 2022-07-24 MED ORDER — ACETAMINOPHEN 325 MG PO TABS
325.0000 mg | ORAL_TABLET | ORAL | Status: DC | PRN
  Administered 2022-07-25 – 2022-07-26 (×2): 650 mg via ORAL
  Filled 2022-07-24 (×2): qty 2

## 2022-07-24 MED ORDER — CEFAZOLIN SODIUM-DEXTROSE 2-4 GM/100ML-% IV SOLN
2.0000 g | INTRAVENOUS | Status: AC
  Administered 2022-07-24: 2 g via INTRAVENOUS
  Filled 2022-07-24: qty 100

## 2022-07-24 MED ORDER — GUAIFENESIN-DM 100-10 MG/5ML PO SYRP: 15.0000 mL | ORAL_SOLUTION | ORAL | Status: DC | PRN

## 2022-07-24 MED ORDER — FENTANYL CITRATE (PF) 100 MCG/2ML IJ SOLN
INTRAMUSCULAR | Status: AC
  Filled 2022-07-24: qty 2

## 2022-07-24 MED ORDER — LIDOCAINE HCL (PF) 1 % IJ SOLN
INTRAMUSCULAR | Status: AC
  Filled 2022-07-24: qty 30

## 2022-07-24 MED ORDER — FENTANYL CITRATE (PF) 100 MCG/2ML IJ SOLN
25.0000 ug | INTRAMUSCULAR | Status: DC | PRN
  Administered 2022-07-24 (×3): 50 ug via INTRAVENOUS

## 2022-07-24 MED ORDER — HYDRALAZINE HCL 20 MG/ML IJ SOLN
INTRAMUSCULAR | Status: AC
  Filled 2022-07-24: qty 1

## 2022-07-24 MED ORDER — SODIUM CHLORIDE 0.9% FLUSH: 3.0000 mL | INTRAVENOUS | Status: DC | PRN

## 2022-07-24 MED ORDER — HYDROMORPHONE HCL 1 MG/ML IJ SOLN
INTRAMUSCULAR | Status: AC
  Filled 2022-07-24: qty 1

## 2022-07-24 MED ORDER — PHENYLEPHRINE HCL-NACL 20-0.9 MG/250ML-% IV SOLN
INTRAVENOUS | Status: DC | PRN
  Administered 2022-07-24: 25 ug/min via INTRAVENOUS

## 2022-07-24 MED ORDER — PHENOL 1.4 % MT LIQD
1.0000 | OROMUCOSAL | Status: DC | PRN
  Filled 2022-07-24: qty 177

## 2022-07-24 MED ORDER — POTASSIUM CHLORIDE CRYS ER 20 MEQ PO TBCR: 20.0000 meq | EXTENDED_RELEASE_TABLET | Freq: Every day | ORAL | Status: DC | PRN

## 2022-07-24 MED ORDER — HEPARIN SODIUM (PORCINE) 1000 UNIT/ML IJ SOLN
INTRAMUSCULAR | Status: AC
  Filled 2022-07-24: qty 20

## 2022-07-24 MED ORDER — DULOXETINE HCL 60 MG PO CPEP
60.0000 mg | ORAL_CAPSULE | Freq: Every day | ORAL | Status: DC
  Administered 2022-07-25 – 2022-07-26 (×2): 60 mg via ORAL
  Filled 2022-07-24 (×2): qty 1

## 2022-07-24 MED ORDER — DOCUSATE SODIUM 100 MG PO CAPS
100.0000 mg | ORAL_CAPSULE | Freq: Every day | ORAL | Status: DC
  Administered 2022-07-25 – 2022-07-26 (×2): 100 mg via ORAL
  Filled 2022-07-24 (×2): qty 1

## 2022-07-24 MED ORDER — ONDANSETRON HCL 4 MG/2ML IJ SOLN
INTRAMUSCULAR | Status: DC | PRN
  Administered 2022-07-24: 4 mg via INTRAVENOUS

## 2022-07-24 MED ORDER — ALUM & MAG HYDROXIDE-SIMETH 200-200-20 MG/5ML PO SUSP: 15.0000 mL | ORAL | Status: DC | PRN

## 2022-07-24 MED ORDER — CLOPIDOGREL BISULFATE 75 MG PO TABS
75.0000 mg | ORAL_TABLET | Freq: Every day | ORAL | Status: DC
  Administered 2022-07-25 – 2022-07-26 (×2): 75 mg via ORAL
  Filled 2022-07-24 (×2): qty 1

## 2022-07-24 MED ORDER — DEXTRAN 40 IN SALINE 10-0.9 % IV SOLN
INTRAVENOUS | Status: AC | PRN
  Administered 2022-07-24: 500 mL

## 2022-07-24 MED ORDER — HEPARIN SODIUM (PORCINE) 5000 UNIT/ML IJ SOLN
5000.0000 [IU] | Freq: Three times a day (TID) | INTRAMUSCULAR | Status: DC
  Administered 2022-07-25 – 2022-07-26 (×4): 5000 [IU] via SUBCUTANEOUS
  Filled 2022-07-24 (×4): qty 1

## 2022-07-24 MED ORDER — ONDANSETRON HCL 4 MG/2ML IJ SOLN: 4.0000 mg | Freq: Four times a day (QID) | INTRAMUSCULAR | Status: DC | PRN

## 2022-07-24 MED ORDER — PANTOPRAZOLE SODIUM 20 MG PO TBEC
20.0000 mg | DELAYED_RELEASE_TABLET | Freq: Every day | ORAL | Status: DC
  Administered 2022-07-25 – 2022-07-26 (×2): 20 mg via ORAL
  Filled 2022-07-24 (×2): qty 1

## 2022-07-24 MED ORDER — FENTANYL CITRATE (PF) 250 MCG/5ML IJ SOLN
INTRAMUSCULAR | Status: AC
  Filled 2022-07-24: qty 5

## 2022-07-24 MED ORDER — ORAL CARE MOUTH RINSE: 15.0000 mL | Freq: Once | OROMUCOSAL | Status: AC

## 2022-07-24 MED ORDER — ACETAMINOPHEN 10 MG/ML IV SOLN
1000.0000 mg | Freq: Once | INTRAVENOUS | Status: AC
  Administered 2022-07-24: 1000 mg via INTRAVENOUS

## 2022-07-24 MED ORDER — GLYCOPYRROLATE PF 0.2 MG/ML IJ SOSY
PREFILLED_SYRINGE | INTRAMUSCULAR | Status: DC | PRN
  Administered 2022-07-24: .2 mg via INTRAVENOUS

## 2022-07-24 MED ORDER — DEXTROSE 5 % IV SOLN
INTRAVENOUS | Status: AC
  Filled 2022-07-24: qty 1000

## 2022-07-24 MED ORDER — FENTANYL CITRATE (PF) 250 MCG/5ML IJ SOLN
INTRAMUSCULAR | Status: DC | PRN
  Administered 2022-07-24: 100 ug via INTRAVENOUS

## 2022-07-24 MED ORDER — ACETAMINOPHEN 650 MG RE SUPP: 325.0000 mg | RECTAL | Status: DC | PRN

## 2022-07-24 MED ORDER — SODIUM CHLORIDE 0.9 % IV SOLN: INTRAVENOUS | Status: DC

## 2022-07-24 MED ORDER — FLEET ENEMA 7-19 GM/118ML RE ENEM: 1.0000 | ENEMA | Freq: Once | RECTAL | Status: DC | PRN

## 2022-07-24 MED ORDER — ATORVASTATIN CALCIUM 80 MG PO TABS
80.0000 mg | ORAL_TABLET | Freq: Every day | ORAL | Status: DC
  Administered 2022-07-24 – 2022-07-26 (×3): 80 mg via ORAL
  Filled 2022-07-24 (×3): qty 1

## 2022-07-24 MED ORDER — METOPROLOL TARTRATE 5 MG/5ML IV SOLN: 2.0000 mg | INTRAVENOUS | Status: DC | PRN

## 2022-07-24 MED ORDER — DEXAMETHASONE SODIUM PHOSPHATE 10 MG/ML IJ SOLN
INTRAMUSCULAR | Status: DC | PRN
  Administered 2022-07-24: 10 mg via INTRAVENOUS

## 2022-07-24 MED ORDER — EPHEDRINE SULFATE-NACL 50-0.9 MG/10ML-% IV SOSY
PREFILLED_SYRINGE | INTRAVENOUS | Status: DC | PRN
  Administered 2022-07-24 (×4): 5 mg via INTRAVENOUS

## 2022-07-24 MED ORDER — DEXAMETHASONE SODIUM PHOSPHATE 10 MG/ML IJ SOLN
8.0000 mg | Freq: Once | INTRAMUSCULAR | Status: AC
  Filled 2022-07-24: qty 0.8

## 2022-07-24 MED ORDER — SODIUM CHLORIDE 0.9 % IV SOLN: 500.0000 mL | Freq: Once | INTRAVENOUS | Status: DC | PRN

## 2022-07-24 MED ORDER — LABETALOL HCL 5 MG/ML IV SOLN: 10.0000 mg | INTRAVENOUS | Status: DC | PRN

## 2022-07-24 MED ORDER — GLYCOPYRROLATE PF 0.2 MG/ML IJ SOSY
PREFILLED_SYRINGE | INTRAMUSCULAR | Status: AC
  Filled 2022-07-24: qty 1

## 2022-07-24 MED ORDER — OXYCODONE-ACETAMINOPHEN 5-325 MG PO TABS
1.0000 | ORAL_TABLET | ORAL | Status: DC | PRN
  Administered 2022-07-25 (×2): 2 via ORAL
  Filled 2022-07-24 (×3): qty 2

## 2022-07-24 MED ORDER — ROCURONIUM BROMIDE 10 MG/ML (PF) SYRINGE
PREFILLED_SYRINGE | INTRAVENOUS | Status: AC
  Filled 2022-07-24: qty 10

## 2022-07-24 MED ORDER — ONDANSETRON HCL 4 MG/2ML IJ SOLN
INTRAMUSCULAR | Status: AC
  Filled 2022-07-24: qty 2

## 2022-07-24 MED ORDER — SODIUM CHLORIDE 0.9% FLUSH
3.0000 mL | Freq: Two times a day (BID) | INTRAVENOUS | Status: DC
  Administered 2022-07-24 – 2022-07-26 (×4): 3 mL via INTRAVENOUS

## 2022-07-24 MED ORDER — SUGAMMADEX SODIUM 200 MG/2ML IV SOLN
INTRAVENOUS | Status: DC | PRN
  Administered 2022-07-24: 200 mg via INTRAVENOUS

## 2022-07-24 MED ORDER — CEFAZOLIN SODIUM-DEXTROSE 2-4 GM/100ML-% IV SOLN
2.0000 g | Freq: Three times a day (TID) | INTRAVENOUS | Status: AC
  Administered 2022-07-24 – 2022-07-25 (×2): 2 g via INTRAVENOUS
  Filled 2022-07-24 (×2): qty 100

## 2022-07-24 MED ORDER — LEVOTHYROXINE SODIUM 25 MCG PO TABS
25.0000 ug | ORAL_TABLET | Freq: Every day | ORAL | Status: DC
  Administered 2022-07-25 – 2022-07-26 (×2): 25 ug via ORAL
  Filled 2022-07-24 (×2): qty 1

## 2022-07-24 MED ORDER — CHLORHEXIDINE GLUCONATE 0.12 % MT SOLN
OROMUCOSAL | Status: AC
  Administered 2022-07-24: 15 mL via OROMUCOSAL
  Filled 2022-07-24: qty 15

## 2022-07-24 MED ORDER — PHENYLEPHRINE HCL-NACL 20-0.9 MG/250ML-% IV SOLN: 25.0000 ug/min | INTRAVENOUS | Status: DC

## 2022-07-24 MED ORDER — OXYCODONE HCL 5 MG/5ML PO SOLN: 5.0000 mg | Freq: Once | ORAL | Status: DC | PRN

## 2022-07-24 MED ORDER — HEMOSTATIC AGENTS (NO CHARGE) OPTIME
TOPICAL | Status: DC | PRN
  Administered 2022-07-24: 1 via TOPICAL

## 2022-07-24 MED ORDER — SODIUM CHLORIDE 0.9 % IV SOLN
0.0000 ug/min | INTRAVENOUS | Status: DC
  Administered 2022-07-24: 30 ug/min via INTRAVENOUS
  Filled 2022-07-24 (×2): qty 2

## 2022-07-24 MED ORDER — SODIUM CHLORIDE 0.9 % IV SOLN: 250.0000 mL | INTRAVENOUS | Status: DC | PRN

## 2022-07-24 MED ORDER — SODIUM CHLORIDE 0.9 % IV SOLN: 250.0000 mL | INTRAVENOUS | Status: DC

## 2022-07-24 MED ORDER — LIDOCAINE 2% (20 MG/ML) 5 ML SYRINGE
INTRAMUSCULAR | Status: AC
  Filled 2022-07-24: qty 5

## 2022-07-24 MED ORDER — BISACODYL 5 MG PO TBEC: 5.0000 mg | DELAYED_RELEASE_TABLET | Freq: Every day | ORAL | Status: DC | PRN

## 2022-07-24 MED ORDER — ALBUMIN HUMAN 5 % IV SOLN
INTRAVENOUS | Status: AC
  Filled 2022-07-24: qty 250

## 2022-07-24 MED ORDER — HEPARIN SODIUM (PORCINE) 1000 UNIT/ML IJ SOLN
INTRAMUSCULAR | Status: DC | PRN
  Administered 2022-07-24: 10000 [IU] via INTRAVENOUS

## 2022-07-24 MED ORDER — METOPROLOL SUCCINATE ER 25 MG PO TB24
25.0000 mg | ORAL_TABLET | Freq: Every day | ORAL | Status: DC
  Filled 2022-07-24: qty 1

## 2022-07-24 MED ORDER — CHLORHEXIDINE GLUCONATE 0.12 % MT SOLN: 15.0000 mL | Freq: Once | OROMUCOSAL | Status: AC

## 2022-07-24 MED ORDER — HYDROMORPHONE HCL 1 MG/ML IJ SOLN
0.2500 mg | INTRAMUSCULAR | Status: DC | PRN
  Administered 2022-07-24: 0.5 mg via INTRAVENOUS

## 2022-07-24 MED ORDER — ROCURONIUM BROMIDE 10 MG/ML (PF) SYRINGE
PREFILLED_SYRINGE | INTRAVENOUS | Status: DC | PRN
  Administered 2022-07-24: 60 mg via INTRAVENOUS

## 2022-07-24 MED ORDER — SODIUM CHLORIDE 0.9 % IV SOLN
0.1500 ug/kg/min | INTRAVENOUS | Status: DC
  Administered 2022-07-24: .2 ug/kg/min via INTRAVENOUS
  Filled 2022-07-24 (×2): qty 2000

## 2022-07-24 MED ORDER — DEXAMETHASONE SODIUM PHOSPHATE 10 MG/ML IJ SOLN
8.0000 mg | Freq: Once | INTRAMUSCULAR | Status: AC
  Administered 2022-07-24: 8 mg via INTRAVENOUS

## 2022-07-24 MED ORDER — HEPARIN 6000 UNIT IRRIGATION SOLUTION
Status: DC | PRN
  Administered 2022-07-24: 1

## 2022-07-24 MED ORDER — CHLORHEXIDINE GLUCONATE CLOTH 2 % EX PADS: 6.0000 | MEDICATED_PAD | Freq: Once | CUTANEOUS | Status: DC

## 2022-07-24 MED ORDER — PROTAMINE SULFATE 10 MG/ML IV SOLN
INTRAVENOUS | Status: AC
  Filled 2022-07-24: qty 5

## 2022-07-24 MED ORDER — STERILE WATER FOR IRRIGATION IR SOLN
Status: DC | PRN
  Administered 2022-07-24: 1000 mL

## 2022-07-24 MED ORDER — SENNOSIDES-DOCUSATE SODIUM 8.6-50 MG PO TABS: 1.0000 | ORAL_TABLET | Freq: Every evening | ORAL | Status: DC | PRN

## 2022-07-24 MED ORDER — CLEVIDIPINE BUTYRATE 0.5 MG/ML IV EMUL
INTRAVENOUS | Status: AC
  Filled 2022-07-24: qty 50

## 2022-07-24 MED ORDER — LIDOCAINE 2% (20 MG/ML) 5 ML SYRINGE
INTRAMUSCULAR | Status: DC | PRN
  Administered 2022-07-24: 60 mg via INTRAVENOUS

## 2022-07-24 MED ORDER — HEPARIN 6000 UNIT IRRIGATION SOLUTION
Status: AC
  Filled 2022-07-24: qty 500

## 2022-07-24 MED ORDER — LACTATED RINGERS IV SOLN: INTRAVENOUS | Status: DC

## 2022-07-24 MED ORDER — OXYCODONE HCL 5 MG PO TABS: 5.0000 mg | ORAL_TABLET | Freq: Once | ORAL | Status: DC | PRN

## 2022-07-24 MED ORDER — PROPOFOL 10 MG/ML IV BOLUS
INTRAVENOUS | Status: AC
  Filled 2022-07-24: qty 20

## 2022-07-24 MED ORDER — PROPOFOL 10 MG/ML IV BOLUS
INTRAVENOUS | Status: DC | PRN
  Administered 2022-07-24: 50 mg via INTRAVENOUS
  Administered 2022-07-24: 150 mg via INTRAVENOUS

## 2022-07-24 MED ORDER — ASPIRIN 81 MG PO CHEW
81.0000 mg | CHEWABLE_TABLET | Freq: Every day | ORAL | Status: DC
  Administered 2022-07-25 – 2022-07-26 (×2): 81 mg via ORAL
  Filled 2022-07-24 (×2): qty 1

## 2022-07-24 MED ORDER — ALBUMIN HUMAN 5 % IV SOLN
12.5000 g | Freq: Once | INTRAVENOUS | Status: AC
  Administered 2022-07-24: 12.5 g via INTRAVENOUS

## 2022-07-24 MED ORDER — PHENYLEPHRINE HCL-NACL 20-0.9 MG/250ML-% IV SOLN: 0.0000 ug/min | INTRAVENOUS | Status: DC

## 2022-07-24 MED ORDER — CHLORHEXIDINE GLUCONATE CLOTH 2 % EX PADS
6.0000 | MEDICATED_PAD | Freq: Every day | CUTANEOUS | Status: DC
  Administered 2022-07-24 – 2022-07-25 (×2): 6 via TOPICAL

## 2022-07-24 MED ORDER — HYDROMORPHONE HCL 1 MG/ML IJ SOLN: 0.5000 mg | INTRAMUSCULAR | Status: DC | PRN

## 2022-07-24 MED ORDER — 0.9 % SODIUM CHLORIDE (POUR BTL) OPTIME
TOPICAL | Status: DC | PRN
  Administered 2022-07-24: 1000 mL

## 2022-07-24 MED ORDER — PROTAMINE SULFATE 10 MG/ML IV SOLN
INTRAVENOUS | Status: DC | PRN
  Administered 2022-07-24: 10 mg via INTRAVENOUS
  Administered 2022-07-24 (×2): 20 mg via INTRAVENOUS

## 2022-07-24 MED ORDER — CHLORTHALIDONE 25 MG PO TABS
25.0000 mg | ORAL_TABLET | Freq: Every day | ORAL | Status: DC
  Administered 2022-07-24 – 2022-07-26 (×3): 25 mg via ORAL
  Filled 2022-07-24 (×3): qty 1

## 2022-07-24 MED ORDER — MAGNESIUM SULFATE 2 GM/50ML IV SOLN: 2.0000 g | Freq: Every day | INTRAVENOUS | Status: DC | PRN

## 2022-07-24 MED ORDER — CLEVIDIPINE BUTYRATE 0.5 MG/ML IV EMUL
INTRAVENOUS | Status: DC | PRN
  Administered 2022-07-24: 2 mg/h via INTRAVENOUS

## 2022-07-24 MED ORDER — LISINOPRIL 20 MG PO TABS
20.0000 mg | ORAL_TABLET | Freq: Every day | ORAL | Status: DC
  Administered 2022-07-24 – 2022-07-26 (×2): 20 mg via ORAL
  Filled 2022-07-24 (×2): qty 1

## 2022-07-24 MED ORDER — CLEVIDIPINE BUTYRATE 0.5 MG/ML IV EMUL
0.0000 mg/h | INTRAVENOUS | Status: DC
  Administered 2022-07-24: 14 mg/h via INTRAVENOUS

## 2022-07-24 MED ORDER — LACTATED RINGERS IV SOLN: INTRAVENOUS | Status: DC | PRN

## 2022-07-24 SURGICAL SUPPLY — 57 items
BAG COUNTER SPONGE SURGICOUNT (BAG) ×1 IMPLANT
CANISTER SUCT 3000ML PPV (MISCELLANEOUS) ×1 IMPLANT
CANNULA VESSEL 3MM 2 BLNT TIP (CANNULA) ×1 IMPLANT
CATH ROBINSON RED A/P 18FR (CATHETERS) ×1 IMPLANT
CLIP LIGATING EXTRA MED SLVR (CLIP) ×1 IMPLANT
CLIP LIGATING EXTRA SM BLUE (MISCELLANEOUS) ×1 IMPLANT
COVER PROBE W GEL 5X96 (DRAPES) ×1 IMPLANT
COVER TRANSDUCER ULTRASND GEL (DISPOSABLE) ×1 IMPLANT
DERMABOND ADVANCED .7 DNX12 (GAUZE/BANDAGES/DRESSINGS) ×1 IMPLANT
DRAIN CHANNEL 15F RND FF W/TCR (WOUND CARE) IMPLANT
ELECT REM PT RETURN 9FT ADLT (ELECTROSURGICAL) ×1
ELECTRODE REM PT RTRN 9FT ADLT (ELECTROSURGICAL) ×1 IMPLANT
EVACUATOR SILICONE 100CC (DRAIN) IMPLANT
GLOVE BIO SURGEON STRL SZ7.5 (GLOVE) ×1 IMPLANT
GLOVE BIOGEL PI IND STRL 8 (GLOVE) ×1 IMPLANT
GLOVE SRG 8 PF TXTR STRL LF DI (GLOVE) ×1 IMPLANT
GLOVE SURG POLYISO LF SZ8 (GLOVE) IMPLANT
GLOVE SURG UNDER POLY LF SZ8 (GLOVE) ×1
GOWN STRL REUS W/ TWL LRG LVL3 (GOWN DISPOSABLE) ×2 IMPLANT
GOWN STRL REUS W/TWL 2XL LVL3 (GOWN DISPOSABLE) ×2 IMPLANT
GOWN STRL REUS W/TWL LRG LVL3 (GOWN DISPOSABLE) ×2
HEMOSTAT SNOW SURGICEL 2X4 (HEMOSTASIS) IMPLANT
IV ADAPTER SYR DOUBLE MALE LL (MISCELLANEOUS) IMPLANT
KIT BASIN OR (CUSTOM PROCEDURE TRAY) ×1 IMPLANT
KIT SHUNT ARGYLE CAROTID ART 6 (VASCULAR PRODUCTS) IMPLANT
KIT TURNOVER KIT B (KITS) ×1 IMPLANT
LOOP VESSEL MINI RED (MISCELLANEOUS) IMPLANT
NDL HYPO 25GX1X1/2 BEV (NEEDLE) IMPLANT
NDL SPNL 20GX3.5 QUINCKE YW (NEEDLE) IMPLANT
NEEDLE HYPO 25GX1X1/2 BEV (NEEDLE) IMPLANT
NEEDLE SPNL 20GX3.5 QUINCKE YW (NEEDLE) IMPLANT
NS IRRIG 1000ML POUR BTL (IV SOLUTION) ×3 IMPLANT
PACK CAROTID (CUSTOM PROCEDURE TRAY) ×1 IMPLANT
PAD ARMBOARD 7.5X6 YLW CONV (MISCELLANEOUS) ×2 IMPLANT
PATCH VASC XENOSURE 1CMX6CM (Vascular Products) ×1 IMPLANT
PATCH VASC XENOSURE 1X6 (Vascular Products) IMPLANT
POSITIONER HEAD DONUT 9IN (MISCELLANEOUS) ×1 IMPLANT
SET WALTER ACTIVATION W/DRAPE (SET/KITS/TRAYS/PACK) ×1 IMPLANT
SHUNT CAROTID BYPASS 10 (VASCULAR PRODUCTS) IMPLANT
SHUNT CAROTID BYPASS 12FRX15.5 (VASCULAR PRODUCTS) IMPLANT
SPONGE SURGIFOAM ABS GEL 100 (HEMOSTASIS) IMPLANT
STOPCOCK 4 WAY LG BORE MALE ST (IV SETS) IMPLANT
SURGIFLO W/THROMBIN 8M KIT (HEMOSTASIS) IMPLANT
SUT ETHILON 3 0 PS 1 (SUTURE) IMPLANT
SUT MNCRL AB 4-0 PS2 18 (SUTURE) ×1 IMPLANT
SUT PROLENE 6 0 BV (SUTURE) ×1 IMPLANT
SUT SILK 3 0 (SUTURE) ×1
SUT SILK 3-0 18XBRD TIE 12 (SUTURE) IMPLANT
SUT VIC AB 2-0 CT1 27 (SUTURE) ×1
SUT VIC AB 2-0 CT1 TAPERPNT 27 (SUTURE) ×1 IMPLANT
SUT VIC AB 3-0 SH 27 (SUTURE) ×3
SUT VIC AB 3-0 SH 27X BRD (SUTURE) ×1 IMPLANT
SYR 20CC LL (SYRINGE) ×2 IMPLANT
SYR CONTROL 10ML LL (SYRINGE) IMPLANT
TOWEL GREEN STERILE (TOWEL DISPOSABLE) ×1 IMPLANT
TUBING ART PRESS 48 MALE/FEM (TUBING) IMPLANT
WATER STERILE IRR 1000ML POUR (IV SOLUTION) ×1 IMPLANT

## 2022-07-24 NOTE — Anesthesia Procedure Notes (Signed)
Arterial Line Insertion Start/End10/09/2022 9:35 AM, 07/24/2022 9:55 AM Performed by: Lieutenant Diego, CRNA, CRNA  Patient location: Pre-op. Preanesthetic checklist: patient identified, IV checked, site marked, risks and benefits discussed, surgical consent, monitors and equipment checked, pre-op evaluation, timeout performed and anesthesia consent Lidocaine 1% used for infiltration Right, radial was placed Catheter size: 22 G Hand hygiene performed  and maximum sterile barriers used   Attempts: 2 Procedure performed using ultrasound guided technique. Ultrasound Notes:anatomy identified, needle tip was noted to be adjacent to the nerve/plexus identified and no ultrasound evidence of intravascular and/or intraneural injection Following insertion, Biopatch. Post procedure assessment: normal  Patient tolerated the procedure well with no immediate complications.

## 2022-07-24 NOTE — Progress Notes (Signed)
Notified Orlie Pollen, MD (Vascular Surgery) of bradycardia and transient MAPs below 65. Received verbal order for 59ml NS bolus and renewed order for neosynephrine.

## 2022-07-24 NOTE — Op Note (Signed)
NAME: Carla [name]    MRN: [id number] DOB: 08/09/1951    DATE OF OPERATION: 07/24/2022  PREOP DIAGNOSIS:   Symptomatic left ICA stenosis  POSTOP DIAGNOSIS:    Same  PROCEDURE:    Left-sided carotid endarterectomy, bovine pericardial patch plasty  SURGEON: Carla [name]  ASSIST: Carla Rancher, PA  ANESTHESIA: General  EBL: 100 mL  INDICATIONS:    Carla [name] is a 71 y.o. female initially seen by my partner Dr. 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  She continues to have some right-sided weakness, as well as what she describes as TIAs.  We discussed the risks and benefits of both transcarotid artery stenting versus carotid endarterectomy.  Carla [name] distal internal carotid artery is small, measuring under 4 mm, and therefore she is not a candidate for TCAR.  After discussing the risk and benefits of carotid endarterectomy, she elected to proceed.  TECHNIQUE:    After full informed written consent was obtained from the patient, the patient was brought back to the operating room and placed supine upon the operating table.  Prior to induction, the patient received IV antibiotics.  After obtaining adequate anesthesia, the patient was placed into semi-Fowler position with a shoulder roll in place and the patient's neck slightly hyperextended and rotated away from the surgical site.    The patient was prepped in the standard fashion for a left carotid endarterectomy.  I made an incision anterior to the sternocleidomastoid muscle and dissected down through the subcutaneous tissue.  The platysmas was opened with electrocautery.  Then I dissected down to the internal jugular vein.  This was dissected posteriorly until I obtained visualization of the common carotid artery.  This was dissected out and then an umbilical tape was placed around the common carotid artery  and I loosely applied a Rumel tourniquet.  I then dissected in a periadventitial fashion along the common carotid artery up to the bifurcation.  I then identified the external carotid artery and the superior thyroid artery.  A 2-0 silk tie was looped around the superior thyroid artery, and I also dissected out the external carotid artery and placed a vessel loop around it.  In continuing the dissection to the internal carotid artery, I identified the facial vein.  This was ligated and then transected, giving me improved exposure of the internal carotid artery.  In the process of this dissection, the hypoglossal nerve was identified.  I then dissected out the internal carotid artery until I identified an area of soft tissue in the internal carotid artery.  I dissected slightly distal to this area, and placed an umbilical tape around the artery and loosely applied a Rumel tourniquet.  At this point, we gave the patient a therapeutic bolus of Heparin intravenously (roughly 100 units/kg) to an ACT greater than 250.  After waiting 3 minutes, then I clamped the internal carotid artery, external carotid artery and then the common carotid artery.  I then made an arteriotomy in the common carotid artery with a 11 blade, and extended the arteriotomy with a Potts scissor down into the common carotid artery, then I carried the arteriotomy through the bifurcation into the internal carotid artery until I reached an area that was not diseased.  At this point, I took the 8 shunt that previously been prepared and I inserted it into the internal carotid artery.  The Rumel tourniquet was then applied to this end of the shunt.  I unclamped the shunt to verify retrograde blood flow in the internal carotid artery.  I then placed the other end of the shunt into the common carotid artery after unclamping the artery.  The Rumel was tightened down around the shunt.  At this point, I verified blood flow in the shunt with a continuous doppler.   At this point, I started the endarterectomy in the common carotid artery with a Technical brewer and carried this dissection down into the common carotid artery circumferentially.  Then I transected the plaque at a segment where it was adherent.  I then carried this dissection up into the external carotid artery.  The plaque was extracted by unclamping the external carotid artery and everting the artery.  Was significant disease in the artery that I could not remove, even with eversion.  The dissection was then carried into the internal carotid artery, extracting the remaining portion of the carotid plaque.  I passed the plaque off the field as a specimen.  I then spent the next 30 minutes removing intimal flaps and loose debris.  Eventually I reached the point where the residual plaque was densely adherent and any further dissection would compromise the integrity of the wall.  After verifying that there was no more loose intimal flaps or debris, I re-interrogated the entirety of this carotid artery.  At this point, I was satisfied that the minimal remaining disease was densely adherent to the wall and wall integrity was intact.  At this point, I then fashioned a bovine pericardial patch for the geometry of this artery and sewed it in place with two running stitch of 6-0 Prolene, one from each end.  Prior to completing this patch angioplasty, I removed the shunt first from the internal carotid artery, from which there was excellent backbleeding, and clamped it.  Then I removed the shunt from the common carotid artery, from which there was excellent antegrade bleeding, and then clamped it.  At this point, I allowed the external carotid artery to backbleed, which was excellent.  Then I instilled heparinized saline in this patched artery and then completed the patch angioplasty in the usual fashion.  First, I released the clamp on the external carotid artery, then I released it on the common carotid artery.  After  waiting a few seconds, I then released it on the internal carotid artery.  I then interrogated this patient's arteries with the continuous Doppler.  The audible waveforms in each artery were consistent with the expected characteristics for each artery.  The Sonosite probe was then sterilely draped and used to interrogate the carotid artery in both longitudinal and transverse views.  At this point, I washed out the wound, and placed thrombin product throughout.  I also gave the patient 50 mg of protamine to reverse his anticoagulation.    There was no more active bleeding in the surgical site.   I then reapproximated the platysma muscle with a running stitch of 3-0 Vicryl.  The skin was then reapproximated with a running subcuticular 4-0 Monocryl stitch.  The skin was then cleaned, dried and Dermabond was used to reinforce the skin closure.  The patient woke without any problems, neurologically intact.   Macie Burows, MD [facility] DATE OF DICTATION:   07/24/2022

## 2022-07-24 NOTE — Discharge Instructions (Signed)
   [facility] [place]  Discharge Instructions   Carotid Surgery  Please refer to the following instructions for your post-procedure care. Your surgeon or physician assistant will discuss any changes with you.  Activity  You are encouraged to walk as much as you can. You can slowly return to normal activities but must avoid strenuous activity and heavy lifting until your doctor tell you it's okay. Avoid activities such as vacuuming or swinging a golf club. You can drive after one week if you are comfortable and you are no longer taking prescription pain medications. It is normal to feel tired for serval weeks after your surgery. It is also normal to have difficulty with sleep habits, eating, and bowel movements after surgery. These will go away with time.  Bathing/Showering  Shower daily after you go home. Do not soak in a bathtub, hot tub, or swim until the incision heals completely.  Incision Care  Shower every day. Clean your incision with mild soap and water. Pat the area dry with a clean towel. You do not need a bandage unless otherwise instructed. Do not apply any ointments or creams to your incision. You may have skin glue on your incision. Do not peel it off. It will come off on its own in about one week. Your incision may feel thickened and raised for several weeks after your surgery. This is normal and the skin will soften over time.   For Men Only: It's okay to shave around the incision but do not shave the incision itself for 2 weeks. It is common to have numbness under your chin that could last for several months.  Diet  Resume your normal diet. There are no special food restrictions following this procedure. A low fat/low cholesterol diet is recommended for all patients with vascular disease. In order to heal from your surgery, it is CRITICAL to get adequate nutrition. Your body requires vitamins, minerals, and protein. Vegetables are the best source of  vitamins and minerals. Vegetables also provide the perfect balance of protein. Processed food has little nutritional value, so try to avoid this.  Medications  Resume taking all of your medications unless your doctor or physician assistant tells you not to. If your incision is causing pain, you may take over-the- counter pain relievers such as acetaminophen (Tylenol). If you were prescribed a stronger pain medication, please be aware these medications can cause nausea and constipation. Prevent nausea by taking the medication with a snack or meal. Avoid constipation by drinking plenty of fluids and eating foods with a high amount of fiber, such as fruits, vegetables, and grains.   Do not take Tylenol if you are taking prescription pain medications.  Follow Up  Our office will schedule a follow up appointment 2-3 weeks following discharge.  Please call us immediately for any of the following conditions  . Increased pain, redness, drainage (pus) from your incision site. . Fever of 101 degrees or higher. . If you should develop stroke (slurred speech, difficulty swallowing, weakness on one side of your body, loss of vision) you should call 911 and go to the nearest emergency room. .  Reduce your risk of vascular disease:  . Stop smoking. If you would like help call [facility] at 1-800-QUIT-NOW (1-800-784-8669) or [place] at 336-586-4000. . Manage your cholesterol . Maintain a desired weight . Control your diabetes . Keep your blood pressure down .  If you have any questions, please call the office at [phone]. 

## 2022-07-24 NOTE — Transfer of Care (Signed)
Immediate Anesthesia Transfer of Care Note  Patient: Carla [name]  Procedure(s) Performed: ENDARTERECTOMY CAROTID WITH PATCH ANGIOPLASTY (Left: Neck) PATCH ANGIOPLASTY WITH XENOSURE 1 X 6 CM BOVINE PATCH (Left: Neck)  Patient Location: PACU  Anesthesia Type:General  Level of Consciousness: awake and alert   Airway & Oxygen Therapy: Patient Spontanous Breathing and Patient connected to nasal cannula oxygen  Post-op Assessment: Report given to RN, Post -op Vital signs reviewed and stable, Patient moving all extremities X 4, and Patient able to stick tongue midline  Post vital signs: Reviewed and stable  Last Vitals:  Vitals Value Taken Time  BP 154/54 07/24/22 1300  Temp 36.6 C 07/24/22 1300  Pulse 64 07/24/22 1303  Resp 24 07/24/22 1303  SpO2 90 % 07/24/22 1303  Vitals shown include unvalidated device data.  Last Pain:  Vitals:   07/24/22 0846  TempSrc:   PainSc: 8       Patients Stated Pain Goal: 2 (41/66/06 3016)  Complications: No notable events documented.

## 2022-07-24 NOTE — Progress Notes (Signed)
She was seen in PACU status post carotid enterectomy. No deficits appreciated  Seen again roughly one hour later. Pt complained of severe headache which appeared to involve bilateral hemispheres over the parietal lobes. I was concerned for possible hyperperfusion syndrome, therefore the patient was taken immediately to stat head CT.  The Noncon imaging demonstrated no bleed or edema, this was confirmed by radiology.  Patient has been administered 8 mg methylprednisone IV Tylenol Pressure management systolic blood pressure 071- 120.  I will place in the ICU overnight for close monitoring. We will be monitoring for neuro deficits and/or seizure as these would necessitate repeat imaging.  Currently under best medical therapy.  I updated Carla [name]'s daughter regarding her overall status.   [name] MD

## 2022-07-24 NOTE — H&P (Signed)
Patient seen and examined in preop holding.  No complaints. No changes to medication history or physical exam since last seen in clinic. Patient with preoperative right-sided weakness, significant memory difficulties speech appears fairly normal.  Carla [name] stated she took aspirin and Plavix this morning. After discussing the risks and benefits of LEFT carotid endarterectomy for symptomatic ICA stenosis, Carla [name] elected to proceed.   [name] MD   [facility] [place]  Patient name: Carla [name] MRN: [id number] DOB: 1951-02-11 Sex: female  REASON FOR CONSULT: Evaluation of carotid stenosis  HPI: Carla [name] is a 71 y.o. 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  She has a very complicated past history.  She reports that in April 2023 she had a stroke affecting her speech and memory.  Apparently she has no memory of her children and grandchildren.  He does report right-sided weakness following this as well.  She reports that she has had multiple episodes of small strokes since this time.  Her work-up around the time of her initial event included CT angiogram on 02/16/2022.  This did show critical left internal carotid artery stenosis.  She walks with a rolling walker.  Does have some clumsiness in her right hand versus her left.  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   Dizziness    Frequent falls    GERD (gastroesophageal reflux disease)    Hypertension    Memory difficulty 2015   Memory loss    Neurology - Dr Thamas Jaegers   Migraine     "4-5/year" (08/19/2016); "@ least once/month now" (05/25/2017)   Myocardial infarction ([place]) 08/18/2016   Neuropathy    Pain management    Pneumonia "several times"   Pre-diabetes    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   "likely related to situational and psychosocial stress, depression, pain"   Shortness of breath dyspnea    with exertion   Spinal stenosis, lumbar region, 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   Stroke ([place]) 01/2022   ischemic frontal lobe   Uses roller walker    right sided weakness   Weakness of both legs     Family History  Problem Relation Age of Onset   Heart failure Father    Diabetes Father    Kidney failure Father     SOCIAL HISTORY: Social History   Socioeconomic History   Marital status: Widowed    Spouse name: Not on file   Number of children: 2   Years of education: 67   Highest education level: Not on file  Occupational History   Occupation: Retired Paediatric nurse and [employer]  Tobacco Use   Smoking status: Every Day    Packs/day: 0.50    Years: 40.00    Total pack years: 20.00    Types: Cigarettes    Start date: 08/05/1970   Smokeless tobacco: Never  Vaping Use   Vaping Use: Never used  Substance and Sexual Activity   Alcohol use: Not Currently    Comment: occasional   Drug use: No   Sexual  activity: Not Currently    Birth control/protection: Post-menopausal  Other Topics Concern   Not on file  Social History Narrative   03/26/22 Lives alone   Right and left handed   Caffeine use: very rare .Has coke 2 times a week.   Multiple uncles and aunts on her father's side have heart disease.   Social Determinants of Health   Financial Resource Strain: Not on file  Food Insecurity: Not on file  Transportation Needs: Not on file  Physical Activity: Not on file  Stress: Not on file  Social Connections: Not on file  Intimate Partner Violence: Not on file    Allergies  Allergen Reactions   Bee Venom Anaphylaxis   Coconut Flavor Anaphylaxis    Per  patient   Hydralazine Shortness Of Breath    Other reaction(s): Headache, Other (See Comments) Chest Pain   Mushroom Extract Complex Anaphylaxis    Extreme sweating, vomiting   Amitriptyline Other (See Comments)    Has nightmares when using, not in right state of mind    Toradol [Ketorolac Tromethamine] Nausea And Vomiting   Tramadol Nausea And Vomiting   Trazodone And Nefazodone Nausea And Vomiting    Current Facility-Administered Medications  Medication Dose Route Frequency Provider Last Rate Last Admin   0.9 %  sodium chloride infusion   Intravenous Continuous [name], MD       ceFAZolin (ANCEF) IVPB 2g/100 mL premix  2 g Intravenous 30 min Pre-Op [name], MD       Chlorhexidine Gluconate Cloth 2 % PADS 6 each  6 each Topical Once [name], MD       And   Chlorhexidine Gluconate Cloth 2 % PADS 6 each  6 each Topical Once [name], MD       lactated ringers infusion   Intravenous Continuous Albertha Ghee, MD 10 mL/hr at 07/24/22 [id number] Continued from Pre-op at 07/24/22 0917   remifentanil (ULTIVA) 2 mg in 100 mL normal saline (20 mcg/mL) Optime  0.15 mcg/kg/min Intravenous Continuous Harden Mo, CRNA       Facility-Administered Medications Ordered in Other Encounters  Medication Dose Route Frequency Provider Last Rate Last Admin   lactated ringers infusion   Intravenous Continuous PRN Harden Mo, CRNA   New Bag at 07/24/22 [phone]    REVIEW OF SYSTEMS:  [X]  denotes positive finding, [ ]  denotes negative finding Cardiac  Comments:  Chest pain or chest pressure: x   Shortness of breath upon exertion: x   Short of breath when lying flat:    Irregular heart rhythm: x       Vascular    Pain in calf, thigh, or hip brought on by ambulation: x   Pain in feet at night that wakes you up from your sleep:  x   Blood clot in your veins:    Leg swelling:         Pulmonary    Oxygen at home:    Productive cough:     Wheezing:         Neurologic     Sudden weakness in arms or legs:  x   Sudden numbness in arms or legs:  x   Sudden onset of difficulty speaking or slurred speech: x   Temporary loss of vision in one eye:  x   Problems with dizziness:  x       Gastrointestinal    Blood in stool:     Vomited  blood:         Genitourinary    Burning when urinating:     Blood in urine:        Psychiatric    Major depression:         Hematologic    Bleeding problems:    Problems with blood clotting too easily:        Skin    Rashes or ulcers:        Constitutional    Fever or chills:      PHYSICAL EXAM: Vitals:   07/24/22 0726  BP: (!) 145/79  Pulse: 63  Resp: 17  Temp: 97.8 F (36.6 C)  TempSrc: Oral  SpO2: 97%  Weight: 98.4 kg  Height: 5\' 4"  (1.626 m)    GENERAL: The patient is a well-nourished female, in no acute distress. The vital signs are documented above. CARDIOVASCULAR: Do not appreciate carotid bruits.  She has 2+ radial pulses bilaterally PULMONARY: There is good air exchange  MUSCULOSKELETAL: There are no major deformities or cyanosis. NEUROLOGIC: No focal weakness or paresthesias are detected. SKIN: There are no ulcers or rashes noted. PSYCHIATRIC: The patient has a normal affect.  DATA:  Duplex today reveals patency of her left internal carotid artery with a critical proximal stenosis  MEDICAL ISSUES: Had a long discussion with the patient.  Explained that she does have critical left internal carotid artery stenosis.  Her logic deficits included difficulty with speech and memory and right-sided weakness.  MRI at the time of her initial presentation did not show acute stroke.  I have recommended left endarterectomy for reduction of stroke risk.  She had been told that this may improve her memory.  I explained that unfortunately this would be exceedingly unlikely and that the goal would be for reduction of future stroke risk.  I described the procedure in detail including expected 1 night  hospitalization.  Also explained the 1-1 and half percent risk of stroke with surgery.  Patient understands and wishes to proceed.  We will schedule this through our [facility] office with Dr. Melene Muller at her convenience   Carla Posner, MD [facility] Tel [phone] Pager [phone]  Note: Portions of this report may have been transcribed using voice recognition software.  Every effort has been made to ensure accuracy; however, inadvertent computerized transcription errors may still be present.

## 2022-07-24 NOTE — Progress Notes (Signed)
Patient started complaining of a severe headache around 1430. No acute neuro changes. Dr. Virl Cagey was paged and came to the bedside. SBP > 200. Patient was started on cleviprex and increased to full dose per Dr. Virl Cagey. Patient was taken for head CT. Patient is back in PACU with stable VS.   Gabriel Rainwater, RN

## 2022-07-24 NOTE — Anesthesia Procedure Notes (Signed)
Procedure Name: Intubation Date/Time: 07/24/2022 10:35 AM  Performed by: Harden Mo, CRNAPre-anesthesia Checklist: Patient identified, Emergency Drugs available, Suction available and Patient being monitored Patient Re-evaluated:Patient Re-evaluated prior to induction Oxygen Delivery Method: Circle System Utilized Preoxygenation: Pre-oxygenation with 100% oxygen Induction Type: IV induction Ventilation: Mask ventilation without difficulty and Oral airway inserted - appropriate to patient size Laryngoscope Size: Sabra Heck and 2 Grade View: Grade I Tube type: Oral Tube size: 7.0 mm Number of attempts: 1 Airway Equipment and Method: Stylet and Oral airway Placement Confirmation: ETT inserted through vocal cords under direct vision, positive ETCO2 and breath sounds checked- equal and bilateral Secured at: 22 cm Tube secured with: Tape Dental Injury: Teeth and Oropharynx as per pre-operative assessment

## 2022-07-24 NOTE — Anesthesia Postprocedure Evaluation (Signed)
Anesthesia Post Note  Patient: Carla [name]  Procedure(s) Performed: ENDARTERECTOMY CAROTID WITH PATCH ANGIOPLASTY (Left: Neck) PATCH ANGIOPLASTY WITH XENOSURE 1 X 6 CM BOVINE PATCH (Left: Neck)     Patient location during evaluation: PACU Anesthesia Type: General Level of consciousness: awake and alert Pain management: pain level controlled Vital Signs Assessment: post-procedure vital signs reviewed and stable Respiratory status: spontaneous breathing, nonlabored ventilation, respiratory function stable and patient connected to nasal cannula oxygen Cardiovascular status: blood pressure returned to baseline and stable Postop Assessment: no apparent nausea or vomiting Anesthetic complications: no   No notable events documented.  Last Vitals:  Vitals:   07/24/22 1615 07/24/22 1630  BP: (!) 76/54 (!) 108/45  Pulse: (!) 50 (!) 47  Resp: 16 16  Temp:  36.6 C  SpO2: 96% 96%    Last Pain:  Vitals:   07/24/22 1430  TempSrc:   PainSc: 10-Worst pain ever                 Tomislav Micale S

## 2022-07-24 NOTE — Progress Notes (Signed)
Bedside RN will notify I.V. team if site needed for pressors. No pressors to be started at this time.

## 2022-07-25 ENCOUNTER — Encounter (HOSPITAL_COMMUNITY): Payer: Self-pay | Admitting: Vascular Surgery

## 2022-07-25 LAB — BASIC METABOLIC PANEL
Anion gap: 13 (ref 5–15)
BUN: 25 mg/dL — ABNORMAL HIGH (ref 8–23)
CO2: 24 mmol/L (ref 22–32)
Calcium: 8.7 mg/dL — ABNORMAL LOW (ref 8.9–10.3)
Chloride: 98 mmol/L (ref 98–111)
Creatinine, Ser: 1.01 mg/dL — ABNORMAL HIGH (ref 0.44–1.00)
GFR, Estimated: >60 mL/min (ref >60)
Glucose, Bld: 172 mg/dL — ABNORMAL HIGH (ref 70–99)
Potassium: 4.6 mmol/L (ref 3.5–5.1)
Sodium: 135 mmol/L (ref 135–145)

## 2022-07-25 LAB — CBC
HCT: 41 % (ref 36.0–46.0)
Hemoglobin: 13.8 g/dL (ref 12.0–15.0)
MCH: 30.4 pg (ref 26.0–34.0)
MCHC: 33.7 g/dL (ref 30.0–36.0)
MCV: 90.3 fL (ref 80.0–100.0)
Platelets: 246 10*3/uL (ref 150–400)
RBC: 4.54 MIL/uL (ref 3.87–5.11)
RDW: 13.7 % (ref 11.5–15.5)
WBC: 20.8 10*3/uL — ABNORMAL HIGH (ref 4.0–10.5)
nRBC: 0 % (ref 0.0–0.2)

## 2022-07-25 LAB — LIPID PANEL
Cholesterol: 109 mg/dL (ref 0–200)
HDL: 39 mg/dL — ABNORMAL LOW (ref >40)
LDL Cholesterol: 43 mg/dL (ref 0–99)
Total CHOL/HDL Ratio: 2.8 RATIO
Triglycerides: 136 mg/dL (ref <150)
VLDL: 27 mg/dL (ref 0–40)

## 2022-07-25 NOTE — Progress Notes (Signed)
07/25/2022 1240 Received pt to room 4E-08 from [place].  Pt is A&O, no C/O voiced.  Tele monitor applied and CCMD notified.  CHG bath given.  Oriented to room, call light and bed.  Call bell in reach. [place]

## 2022-07-25 NOTE — Progress Notes (Addendum)
  Progress Note    07/25/2022 6:43 AM 1 Day Post-Op  Subjective:  sitting up in bed eating breakfast.  She says her headache is better.  Says she has had some headaches prior to admission that were left sided.  She has not been out of bed.    Afebrile HR 40's-60's SB/NSR 56'E-332'R systolic 518% [id number]  Gtts: None (neo weaned off)  Vitals:   07/25/22 0600 07/25/22 0615  BP: (!) 108/49 101/83  Pulse: (!) 43 (!) 47  Resp: 15 18  Temp:    SpO2: 98% 100%    Physical Exam: Cardiac:  regular Lungs:  non labored Incisions:  clean and dry Neuro:  hand grip with right >left; tongue is midline   CBC    Component Value Date/Time   WBC 12.9 (H) 07/24/2022 0853   RBC 5.27 (H) 07/24/2022 0853   HGB 16.0 (H) 07/24/2022 0853   HGB 15.4 07/16/2015 1628   HCT 47.7 (H) 07/24/2022 0853   HCT 46.2 07/16/2015 1628   PLT 276 07/24/2022 0853   PLT 363 07/16/2015 1628   MCV 90.5 07/24/2022 0853   MCV 90 07/16/2015 1628   MCH 30.4 07/24/2022 0853   MCHC 33.5 07/24/2022 0853   RDW 13.7 07/24/2022 0853   RDW 13.6 07/16/2015 1628   LYMPHSABS 3.9 02/18/2022 1550   LYMPHSABS 5.6 (H) 07/16/2015 1628   MONOABS 1.1 (H) 02/18/2022 1550   EOSABS 0.1 02/18/2022 1550   EOSABS 0.0 07/16/2015 1628   BASOSABS 0.0 02/18/2022 1550   BASOSABS 0.0 07/16/2015 1628    BMET    Component Value Date/Time   NA 136 07/24/2022 0853   K 4.9 07/24/2022 0853   CL 99 07/24/2022 0853   CO2 25 07/24/2022 0853   GLUCOSE 139 (H) 07/24/2022 0853   BUN 26 (H) 07/24/2022 0853   CREATININE 0.93 07/24/2022 0853   CALCIUM 9.4 07/24/2022 0853   GFRNONAA >60 07/24/2022 0853   GFRAA >60 09/23/2017 0859    INR    Component Value Date/Time   INR 1.0 07/24/2022 0853     Intake/Output Summary (Last 24 hours) at 07/25/2022 0643 Last data filed at 07/25/2022 0600 Gross per 24 hour  Intake 1759.7 ml  Output 800 ml  Net 959.7 ml     Assessment/Plan:  71 y.o. female is s/p:  Left CEA  1 Day  Post-Op   -pt doing well this morning and tells me she is at baseline. -her headache has improved.  Her head CT was negative.  -will keep overnight tonight for observation and most likely discharge home tomorrow. -transfer to [place] -continue asa/statin/plavix   Leontine Locket, PA-C [facility] [phone] 07/25/2022 6:43 AM  VASCULAR STAFF ADDENDUM: I have independently interviewed and examined the patient. I agree with the above.  Feels much better today.  States headaches similar to yesterday happened at home on a regular basis. Blood pressure normal, Neo weaned We will hold today and monitor overall clinical status No new deficits Neck incision looks good   Cassandria Santee, MD [facility] Phone Number: [phone] 07/25/2022 8:17 AM

## 2022-07-25 NOTE — Progress Notes (Signed)
Mobility Specialist Progress Note:   07/25/22 1405  Mobility  Activity Ambulated with assistance in room;Ambulated with assistance to bathroom  Level of Assistance Standby assist, set-up cues, supervision of patient - no hands on  Assistive Device Four wheel walker  Distance Ambulated (ft) 32 ft  Activity Response Tolerated well  Mobility Referral Yes  $Mobility charge 1 Mobility   Pt received in chair. No complaints of pain. Pt stated she's slower moving d/t being in the bed for the past couple days. Left in chair with call bell in reach and all needs met.   [facility] Surveyor, mining Chat only

## 2022-07-25 NOTE — Progress Notes (Signed)
PHARMACIST LIPID MONITORING   Carla [name] is a 71 y.o. female admitted on 07/24/2022 for left carotid endarterectomy for symptomatic ICA stenosis.  Pharmacy has been consulted to optimize lipid-lowering therapy with the indication of secondary prevention for clinical ASCVD.  Recent Labs:  Lipid Panel (last 6 months):   Lab Results  Component Value Date   CHOL 109 07/25/2022   TRIG 136 07/25/2022   HDL 39 (L) 07/25/2022   CHOLHDL 2.8 07/25/2022   VLDL 27 07/25/2022   LDLCALC 43 07/25/2022    Hepatic function panel (last 6 months):   Lab Results  Component Value Date   AST 32 07/24/2022   ALT 28 07/24/2022   ALKPHOS 81 07/24/2022   BILITOT 1.2 07/24/2022    SCr (since admission):   Serum creatinine: 1.01 mg/dL (H) 07/25/22 2703 Estimated creatinine clearance: 60.8 mL/min (A)  Current therapy and lipid therapy tolerance Current lipid-lowering therapy: atorvastatin 80mg  Previous lipid-lowering therapies (if applicable): n/a Documented or reported allergies or intolerances to lipid-lowering therapies (if applicable): n/a  Assessment:   LDL at goal on high intensity statin. No changes for now.   Plan:    1.Statin intensity (high intensity recommended for all patients regardless of the LDL):  No statin changes. The patient is already on a high intensity statin.  2.Add ezetimibe (if any one of the following):   Not indicated at this time.  3.Refer to lipid clinic:   No  4.Follow-up with:  Primary care provider - Hyler, Gwen Her, NP (Inactive)  5.Follow-up labs after discharge:  No changes in lipid therapy, repeat a lipid panel in one year.     Erin Hearing PharmD., BCPS Clinical Pharmacist 07/25/2022 1:50 PM

## 2022-07-26 MED ORDER — OXYCODONE-ACETAMINOPHEN 5-325 MG PO TABS: 1.0000 | ORAL_TABLET | Freq: Four times a day (QID) | ORAL | 0 refills | Status: DC | PRN

## 2022-07-26 NOTE — Progress Notes (Addendum)
  Progress Note    07/26/2022 9:04 AM 2 Days Post-Op  Subjective:  no complaints.  Headaches much improved.  Ready for discharge home.   Vitals:   07/26/22 0341 07/26/22 0821  BP: (!) 130/106 (!) 129/47  Pulse: (!) 57 (!) 47  Resp: 15 15  Temp: 97.7 F (36.5 C) 98.5 F (36.9 C)  SpO2: 100% 95%   Physical Exam: Lungs:  non labored Incisions:  L neck c/d/i Extremities:  moving all extremities well Neurologic: CN grossly intact  CBC    Component Value Date/Time   WBC 20.8 (H) 07/25/2022 0621   RBC 4.54 07/25/2022 0621   HGB 13.8 07/25/2022 0621   HGB 15.4 07/16/2015 1628   HCT 41.0 07/25/2022 0621   HCT 46.2 07/16/2015 1628   PLT 246 07/25/2022 0621   PLT 363 07/16/2015 1628   MCV 90.3 07/25/2022 0621   MCV 90 07/16/2015 1628   MCH 30.4 07/25/2022 0621   MCHC 33.7 07/25/2022 0621   RDW 13.7 07/25/2022 0621   RDW 13.6 07/16/2015 1628   LYMPHSABS 3.9 02/18/2022 1550   LYMPHSABS 5.6 (H) 07/16/2015 1628   MONOABS 1.1 (H) 02/18/2022 1550   EOSABS 0.1 02/18/2022 1550   EOSABS 0.0 07/16/2015 1628   BASOSABS 0.0 02/18/2022 1550   BASOSABS 0.0 07/16/2015 1628    BMET    Component Value Date/Time   NA 135 07/25/2022 0621   K 4.6 07/25/2022 0621   CL 98 07/25/2022 0621   CO2 24 07/25/2022 0621   GLUCOSE 172 (H) 07/25/2022 0621   BUN 25 (H) 07/25/2022 0621   CREATININE 1.01 (H) 07/25/2022 0621   CALCIUM 8.7 (L) 07/25/2022 0621   GFRNONAA >60 07/25/2022 0621   GFRAA >60 09/23/2017 0859    INR    Component Value Date/Time   INR 1.0 07/24/2022 0853     Intake/Output Summary (Last 24 hours) at 07/26/2022 0904 Last data filed at 07/25/2022 2329 Gross per 24 hour  Intake 480 ml  Output 452 ml  Net 28 ml     Assessment/Plan:  71 y.o. female is s/p L CEA 2 Days Post-Op   Headaches much improved; no further R sided weakness L neck incision healing well BP normal without pressors Home today; office will arrange follow up in [place] with Dr.  Cherly Anderson, PA-C [facility] [phone] 07/26/2022 9:04 AM  I have seen and evaluated the patient. I agree with the PA note as documented above.  Postop day 2 status post left carotid endarterectomy for a symptomatic stenosis.  Her severe headaches have improved and are now resolved.  Grossly at her neurologic baseline.  Left neck incision clean dry and intact.  Plan discharge home today.  Discussed aspirin statin Plavix for risk reduction.  Discussed plan for follow-up in 2 weeks with Dr. Virl Cagey in the [place] office.  She has transportation  issues and does not drive and does not feel she can get to [place].  Discussed we will have her follow-up with Dr. Donnetta Hutching in [place] where she was initially evaluated.  Discussed she will need long term surveillance.  Marty Heck, MD [facility] [place] Office: [phone]

## 2022-07-26 NOTE — Progress Notes (Signed)
Patient discharging home. Awaiting for [employer]. IV removed without complications. Tele removed and CCMD notified. Discharge instructions given and medication administration discussed. All questions answered.  Carla [name]

## 2022-07-26 NOTE — Progress Notes (Signed)
Mobility Specialist Progress Note    07/26/22 0944  Mobility  Activity Ambulated independently in hallway  Level of Assistance Modified independent, requires aide device or extra time  Assistive Device Four wheel walker  Distance Ambulated (ft) 335 ft  Activity Response Tolerated well  Mobility Referral Yes  $Mobility charge 1 Mobility   Pre-Mobility: 43 HR During Mobility: 65 HR Post-Mobility: 55 HR  Pt received in chair and agreeable. C/o fatigue. Returned to chair with call bell in reach.    Hildred Alamin Mobility Specialist  Secure Chat Only

## 2022-07-28 NOTE — Discharge Summary (Signed)
Discharge Summary     Carla [name] 1951/05/09 71 y.o. female  [id number]  Admission Date: 07/24/2022  Discharge Date: 07/26/22  Physician: Dr. Karin Lieu  Admission Diagnosis: Symptomatic carotid artery stenosis, left [I65.22]  Discharge Day services:    See progress note 07/26/22  Hospital Course:  [name] is a 71 year old female who underwent left-sided carotid endarterectomy due to high-grade symptomatic stenosis.  This was performed by Dr. Vicenta Dunning ins on 07/24/2022.  Postoperatively she experienced severe headaches while in PACU and underwent CT scan which was negative for bleed or edema.  She was admitted to the ICU for the next 24 hours.  She was eventually transferred to the stepdown unit and headaches gradually subsided and eventually resolved.  She has residual right-sided weakness however did not experience any further strokelike symptoms during hospitalization.  Left neck incision was well-appearing throughout her hospital stay.  By the time of discharge she was ambulating well and ready for discharge home.  She will follow-up in the office in 2 to 3 weeks for incision check.  She was prescribed pain medication for postoperative pain control.  Discharge instructions were reviewed and she voiced her understanding.  She was discharged home in stable condition.  She will continue her aspirin and Plavix regimen.   No results for input(s): "NA", "K", "CL", "CO2", "GLUCOSE", "BUN", "CALCIUM" in the last 72 hours.  Invalid input(s): "CRATININE" No results for input(s): "WBC", "HGB", "HCT", "PLT" in the last 72 hours. No results for input(s): "INR" in the last 72 hours.     Discharge Diagnosis:  Symptomatic carotid artery stenosis, left [I65.22]  Secondary Diagnosis: Patient Active Problem List   Diagnosis Date Noted   Symptomatic carotid artery stenosis, left 07/24/2022   Bilateral leg edema 05/25/2017   Precordial chest pain 05/25/2017   Acute coronary  syndrome (HCC) 08/19/2016    Class: Acute   Contusion, multiple sites 06/01/2016   Degenerative spondylolisthesis 05/09/2016   Intractable nausea and vomiting 03/10/2016   Abdominal pain 03/10/2016   Abnormal thyroid function test 03/08/2016   Chest pain, unspecified 03/07/2016   Epigastric abdominal pain 03/07/2016   Cholecystitis, acute 03/07/2016   Chronic pain syndrome 03/07/2016   Prediabetes 03/07/2016   Obesity, morbid (HCC) 03/07/2016   Elevated troponin 12/21/2015   Fall 12/21/2015   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   Erythrocytosis    Essential hypertension 06/29/2015   Spinal stenosis, lumbar region, 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   Anxiety attack 03/08/2012   COPD (chronic obstructive pulmonary disease) (HCC) 03/05/2012   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   Dizziness    Frequent falls    GERD (gastroesophageal reflux disease)    Hypertension    Memory difficulty 2015   Memory loss    Neurology - Dr Babette Relic   Migraine    "4-5/year" (08/19/2016); "@ least once/month now" (05/25/2017)   Myocardial infarction (HCC) 08/18/2016   Neuropathy    Pain management    Pneumonia "several times"   Pre-diabetes    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   "likely related to situational and psychosocial stress, depression,  pain"   Shortness of breath dyspnea    with exertion   Spinal stenosis, lumbar region, 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   Stroke (HCC) 01/2022   ischemic frontal lobe   Uses roller walker    right sided weakness   Weakness of both legs     Allergies as of 07/26/2022       Reactions   Bee Venom Anaphylaxis    Coconut Flavor Anaphylaxis   Per patient   Hydralazine Shortness Of Breath   Other reaction(s): Headache, Other (See Comments) Chest Pain   Mushroom Extract Complex Anaphylaxis   Extreme sweating, vomiting   Amitriptyline Other (See Comments)   Has nightmares when using, not in right state of mind    Toradol [ketorolac Tromethamine] Nausea And Vomiting   Tramadol Nausea And Vomiting   Trazodone And Nefazodone Nausea And Vomiting        Medication List     TAKE these medications    albuterol 108 (90 Base) MCG/ACT inhaler Commonly known as: VENTOLIN HFA Inhale 2 puffs into the lungs every 6 (six) hours as needed for wheezing.   aspirin 81 MG chewable tablet Chew 81 mg by mouth every morning.   atorvastatin 80 MG tablet Commonly known as: LIPITOR Take 1 tablet (80 mg total) by mouth daily at 6 PM. What changed:  when to take this additional instructions   benzonatate 100 MG capsule Commonly known as: TESSALON Take 1 capsule (100 mg total) by mouth 3 (three) times daily as needed for cough.   budesonide-formoterol 160-4.5 MCG/ACT inhaler Commonly known as: SYMBICORT Inhale 2 puffs into the lungs 2 (two) times daily.   chlorthalidone 25 MG tablet Commonly known as: HYGROTON Take 25 mg by mouth daily.   clopidogrel 75 MG tablet Commonly known as: PLAVIX Take 75 mg by mouth daily.   diphenhydrAMINE 25 mg capsule Commonly known as: BENADRYL Take 50 mg by mouth at bedtime as needed for allergies.   DULoxetine 60 MG capsule Commonly known as: CYMBALTA Take 60 mg by mouth daily.   levothyroxine 25 MCG tablet Commonly known as: SYNTHROID Take 25 mcg by mouth daily before breakfast.   lidocaine 4 % Place 1 patch onto the skin daily.   lisinopril 20 MG tablet Commonly known as: ZESTRIL Take 20 mg by mouth every morning.   metoprolol succinate 25 MG 24 hr tablet Commonly known as: TOPROL-XL Take 25 mg by mouth every morning.   nitroGLYCERIN 0.4 MG SL  tablet Commonly known as: NITROSTAT Place 1 tablet (0.4 mg total) under the tongue every 5 (five) minutes x 3 doses as needed for chest pain.   oxyCODONE-acetaminophen 5-325 MG tablet Commonly known as: PERCOCET/ROXICET Take 1 tablet by mouth every 6 (six) hours as needed for moderate pain.   pantoprazole 20 MG tablet Commonly known as: PROTONIX Take 20 mg by mouth daily.         Discharge Instructions:   [facility] Discharge Instructions Carotid Endarterectomy (CEA)  Please refer to the following instructions for your post-procedure care. Your surgeon or physician assistant will discuss any changes with you.  Activity  You are encouraged to walk as much as you can. You can slowly return to normal activities but must avoid strenuous activity and heavy lifting until your doctor tell you it's OK. Avoid activities such as vacuuming or swinging a golf club. You can drive after one week if you are comfortable and you are no longer taking prescription pain medications.  It is normal to feel tired for serval weeks after your surgery. It is also normal to have difficulty with sleep habits, eating, and bowel movements after surgery. These will go away with time.  Bathing/Showering  You may shower after you come home. Do not soak in a bathtub, hot tub, or swim until the incision heals completely.  Incision Care  Shower every day. Clean your incision with mild soap and water. Pat the area dry with a clean towel. You do not need a bandage unless otherwise instructed. Do not apply any ointments or creams to your incision. You may have skin glue on your incision. Do not peel it off. It will come off on its own in about one week. Your incision may feel thickened and raised for several weeks after your surgery. This is normal and the skin will soften over time. For Men Only: It's OK to shave around the incision but do not shave the incision itself for 2 weeks. It is  common to have numbness under your chin that could last for several months.  Diet  Resume your normal diet. There are no special food restrictions following this procedure. A low fat/low cholesterol diet is recommended for all patients with vascular disease. In order to heal from your surgery, it is CRITICAL to get adequate nutrition. Your body requires vitamins, minerals, and protein. Vegetables are the best source of vitamins and minerals. Vegetables also provide the perfect balance of protein. Processed food has little nutritional value, so try to avoid this.  Medications  Resume taking all of your medications unless your doctor or physician assistant tells you not to.  If your incision is causing pain, you may take over-the- counter pain relievers such as acetaminophen (Tylenol). If you were prescribed a stronger pain medication, please be aware these medications can cause nausea and constipation.  Prevent nausea by taking the medication with a snack or meal. Avoid constipation by drinking plenty of fluids and eating foods with a high amount of fiber, such as fruits, vegetables, and grains. Do not take Tylenol if you are taking prescription pain medications.  Follow Up  Our office will schedule a follow up appointment 2-3 weeks following discharge.  Please call us immediately for any of the following conditions  Increased pain, redness, drainage (pus) from your incision site. Fever of 101 degrees or higher. If you should develop stroke (slurred speech, difficulty swallowing, weakness on one side of your body, loss of vision) you should call 911 and go to the nearest emergency room.  Reduce your risk of vascular disease:  Stop smoking. If you would like help call [facility] at 1-800-QUIT-NOW (913 035 7666) or [place] at [phone]. Manage your cholesterol Maintain a desired weight Control your diabetes Keep your blood pressure down  If you have any questions, please call the  office at [phone].   Disposition: home  Patient's condition: is Good  Follow up: 1. [facility] in 2 weeks.   Emilie Rutter, PA-C [facility] [phone]   --- For [facility] Registry use ---   Modified Rankin score at D/C (0-6): 1  IV medication needed for:  1. Hypertension: No 2. Hypotension: No  Post-op Complications: No  1. Post-op CVA or TIA: No  If yes: Event classification (right eye, left eye, right cortical, left cortical, verterobasilar, other):   If yes: Timing of event (intra-op, <6 hrs post-op, >=6 hrs post-op, unknown):   2. CN injury: No  If yes: CN  injuried   3.  Myocardial infarction: No  If yes: Dx by (EKG or clinical, Troponin):   4.  CHF: No  5.  Dysrhythmia (new): No  6. Wound infection: No  7. Reperfusion symptoms: Yes  8. Return to OR: No  If yes: return to OR for (bleeding, neurologic, other CEA incision, other):   Discharge medications: Statin use:  Yes ASA use:  Yes   Beta blocker use:  Yes ACE-Inhibitor use:  Yes  ARB use:  No CCB use: No P2Y12 Antagonist use: Yes, [ ]  Plavix, [ ]  Plasugrel, [ ]  Ticlopinine, [ ]  Ticagrelor, [ ]  Other, [ ]  No for medical reason, [ ]  Non-compliant, [ ]  Not-indicated Anti-coagulant use:  No, [ ]  Warfarin, [ ]  Rivaroxaban, [ ]  Dabigatran,

## 2022-08-13 ENCOUNTER — Ambulatory Visit (INDEPENDENT_AMBULATORY_CARE_PROVIDER_SITE_OTHER): Payer: Medicare HMO | Admitting: Vascular Surgery

## 2022-08-13 ENCOUNTER — Encounter: Payer: Self-pay | Admitting: Vascular Surgery

## 2022-08-13 VITALS — BP 108/71 | HR 63 | Temp 97.5°F | Ht 64.0 in | Wt 217.0 lb

## 2022-08-13 DIAGNOSIS — I6529 Occlusion and stenosis of unspecified carotid artery: Secondary | ICD-10-CM

## 2022-08-13 NOTE — Progress Notes (Signed)
[facility] [place]  Patient name: Carla [name] MRN: [id number] DOB: 1951-05-02 Sex: female  REASON FOR VISIT: Follow-up left carotid endarterectomy for symptomatic carotid disease  HPI: Carla [name] is a 71 y.o. female here today for follow-up.  She underwent left carotid endarterectomy on 07/24/2022 with Dr. Virl Cagey.  He did well and was discharged to home without difficulty.  She does report headache initially following her surgery and this is solved.  She also reports some sensation of bilateral jaw soreness when chewing but no swallowing difficulty.  He had no new neurologic deficits.  Current Outpatient Medications  Medication Sig Dispense Refill   albuterol (PROVENTIL HFA;VENTOLIN HFA) 108 (90 BASE) MCG/ACT inhaler Inhale 2 puffs into the lungs every 6 (six) hours as needed for wheezing. 1 Inhaler 2   aspirin 81 MG chewable tablet Chew 81 mg by mouth every morning.     atorvastatin (LIPITOR) 80 MG tablet Take 1 tablet (80 mg total) by mouth daily at 6 PM. (Patient taking differently: Take 80 mg by mouth daily. Takes at 5 pm daily) 30 tablet 6   budesonide-formoterol (SYMBICORT) 160-4.5 MCG/ACT inhaler Inhale 2 puffs into the lungs 2 (two) times daily.     chlorthalidone (HYGROTON) 25 MG tablet Take 25 mg by mouth daily.     clopidogrel (PLAVIX) 75 MG tablet Take 75 mg by mouth daily.     diphenhydrAMINE (BENADRYL) 25 mg capsule Take 50 mg by mouth at bedtime as needed for allergies.     DULoxetine (CYMBALTA) 60 MG capsule Take 60 mg by mouth daily.     levothyroxine (SYNTHROID) 25 MCG tablet Take 25 mcg by mouth daily before breakfast.     lidocaine 4 % Place 1 patch onto the skin daily.     lisinopril (ZESTRIL) 20 MG tablet Take 20 mg by mouth every morning.     metoprolol succinate (TOPROL-XL) 25 MG 24 hr tablet Take 25 mg by mouth every morning.     nitroGLYCERIN (NITROSTAT) 0.4 MG SL tablet Place 1 tablet (0.4 mg  total) under the tongue every 5 (five) minutes x 3 doses as needed for chest pain. 25 tablet 1   pantoprazole (PROTONIX) 20 MG tablet Take 20 mg by mouth daily.     benzonatate (TESSALON) 100 MG capsule Take 1 capsule (100 mg total) by mouth 3 (three) times daily as needed for cough. (Patient not taking: Reported on 07/21/2022) 21 capsule 0   oxyCODONE-acetaminophen (PERCOCET/ROXICET) 5-325 MG tablet Take 1 tablet by mouth every 6 (six) hours as needed for moderate pain. (Patient not taking: Reported on 08/13/2022) 15 tablet 0   No current facility-administered medications for this visit.     PHYSICAL EXAM: Vitals:   08/13/22 0813 08/13/22 0818  BP: 122/80 108/71  Pulse: 63   Temp: (!) 97.5 F (36.4 C)   SpO2: 96%   Weight: 217 lb (98.4 kg)   Height: 5\' 4"  (1.626 m)     GENERAL: The patient is a well-nourished female, in no acute distress. The vital signs are documented above. Left neck incision well-healed with usual healing ridge.  Neurologically intact  MEDICAL ISSUES: Stable status post left carotid endarterectomy for severe symptomatic carotid stenosis.  She will resume full activities.  She will notify us of any neurologic deficits.  Otherwise we will see her 9 months with repeat duplex   Rosetta Posner, MD FACS [facility] [phone]  Note: Portions of  this report may have been transcribed using voice recognition software.  Every effort has been made to ensure accuracy; however, inadvertent computerized transcription errors may still be present.

## 2022-08-14 ENCOUNTER — Other Ambulatory Visit: Payer: Self-pay

## 2022-08-14 DIAGNOSIS — I6529 Occlusion and stenosis of unspecified carotid artery: Secondary | ICD-10-CM

## 2023-07-21 ENCOUNTER — Other Ambulatory Visit (HOSPITAL_COMMUNITY): Payer: Self-pay | Admitting: Adult Health

## 2023-07-21 DIAGNOSIS — Z13820 Encounter for screening for osteoporosis: Secondary | ICD-10-CM | POA: Diagnosis not present

## 2023-07-21 DIAGNOSIS — J449 Chronic obstructive pulmonary disease, unspecified: Secondary | ICD-10-CM | POA: Diagnosis not present

## 2023-07-21 DIAGNOSIS — E038 Other specified hypothyroidism: Secondary | ICD-10-CM | POA: Diagnosis not present

## 2023-07-21 DIAGNOSIS — I7389 Other specified peripheral vascular diseases: Secondary | ICD-10-CM | POA: Diagnosis not present

## 2023-07-21 DIAGNOSIS — Z1211 Encounter for screening for malignant neoplasm of colon: Secondary | ICD-10-CM | POA: Diagnosis not present

## 2023-07-21 DIAGNOSIS — E782 Mixed hyperlipidemia: Secondary | ICD-10-CM | POA: Diagnosis not present

## 2023-07-21 DIAGNOSIS — G4709 Other insomnia: Secondary | ICD-10-CM | POA: Diagnosis not present

## 2023-07-21 DIAGNOSIS — Z1231 Encounter for screening mammogram for malignant neoplasm of breast: Secondary | ICD-10-CM | POA: Diagnosis not present

## 2023-07-21 DIAGNOSIS — Z0000 Encounter for general adult medical examination without abnormal findings: Secondary | ICD-10-CM | POA: Diagnosis not present

## 2023-07-21 DIAGNOSIS — Z136 Encounter for screening for cardiovascular disorders: Secondary | ICD-10-CM | POA: Diagnosis not present

## 2023-07-21 DIAGNOSIS — N183 Chronic kidney disease, stage 3 unspecified: Secondary | ICD-10-CM | POA: Diagnosis not present

## 2023-07-21 DIAGNOSIS — R059 Cough, unspecified: Secondary | ICD-10-CM

## 2023-08-04 DIAGNOSIS — R296 Repeated falls: Secondary | ICD-10-CM | POA: Diagnosis not present

## 2023-08-07 DIAGNOSIS — E782 Mixed hyperlipidemia: Secondary | ICD-10-CM | POA: Diagnosis not present

## 2023-08-07 DIAGNOSIS — R052 Subacute cough: Secondary | ICD-10-CM | POA: Diagnosis not present

## 2023-08-07 DIAGNOSIS — M545 Low back pain, unspecified: Secondary | ICD-10-CM | POA: Diagnosis not present

## 2023-08-07 DIAGNOSIS — I10 Essential (primary) hypertension: Secondary | ICD-10-CM | POA: Diagnosis not present

## 2023-08-07 DIAGNOSIS — E038 Other specified hypothyroidism: Secondary | ICD-10-CM | POA: Diagnosis not present

## 2023-08-07 DIAGNOSIS — G4700 Insomnia, unspecified: Secondary | ICD-10-CM | POA: Diagnosis not present

## 2023-08-07 DIAGNOSIS — Z136 Encounter for screening for cardiovascular disorders: Secondary | ICD-10-CM | POA: Diagnosis not present

## 2023-08-07 DIAGNOSIS — E119 Type 2 diabetes mellitus without complications: Secondary | ICD-10-CM | POA: Diagnosis not present

## 2023-08-07 DIAGNOSIS — Z515 Encounter for palliative care: Secondary | ICD-10-CM | POA: Diagnosis not present

## 2023-08-07 DIAGNOSIS — I6359 Cerebral infarction due to unspecified occlusion or stenosis of other cerebral artery: Secondary | ICD-10-CM | POA: Diagnosis not present

## 2023-08-11 DIAGNOSIS — E039 Hypothyroidism, unspecified: Secondary | ICD-10-CM | POA: Diagnosis not present

## 2023-08-11 DIAGNOSIS — I10 Essential (primary) hypertension: Secondary | ICD-10-CM | POA: Diagnosis not present

## 2023-08-11 DIAGNOSIS — R0602 Shortness of breath: Secondary | ICD-10-CM | POA: Diagnosis not present

## 2023-08-11 DIAGNOSIS — E559 Vitamin D deficiency, unspecified: Secondary | ICD-10-CM | POA: Diagnosis not present

## 2023-08-11 DIAGNOSIS — J449 Chronic obstructive pulmonary disease, unspecified: Secondary | ICD-10-CM | POA: Diagnosis not present

## 2023-08-11 DIAGNOSIS — G458 Other transient cerebral ischemic attacks and related syndromes: Secondary | ICD-10-CM | POA: Diagnosis not present

## 2023-08-11 DIAGNOSIS — K219 Gastro-esophageal reflux disease without esophagitis: Secondary | ICD-10-CM | POA: Diagnosis not present

## 2023-08-18 DIAGNOSIS — F17210 Nicotine dependence, cigarettes, uncomplicated: Secondary | ICD-10-CM | POA: Diagnosis not present

## 2023-08-22 DIAGNOSIS — J0100 Acute maxillary sinusitis, unspecified: Secondary | ICD-10-CM | POA: Diagnosis not present

## 2023-09-02 DIAGNOSIS — F17210 Nicotine dependence, cigarettes, uncomplicated: Secondary | ICD-10-CM | POA: Diagnosis not present

## 2023-09-02 DIAGNOSIS — E119 Type 2 diabetes mellitus without complications: Secondary | ICD-10-CM | POA: Diagnosis not present

## 2023-09-02 DIAGNOSIS — I2510 Atherosclerotic heart disease of native coronary artery without angina pectoris: Secondary | ICD-10-CM | POA: Diagnosis not present

## 2023-09-02 DIAGNOSIS — J449 Chronic obstructive pulmonary disease, unspecified: Secondary | ICD-10-CM | POA: Diagnosis not present

## 2023-09-08 DIAGNOSIS — J449 Chronic obstructive pulmonary disease, unspecified: Secondary | ICD-10-CM | POA: Diagnosis not present

## 2023-09-08 DIAGNOSIS — I10 Essential (primary) hypertension: Secondary | ICD-10-CM | POA: Diagnosis not present

## 2023-09-08 DIAGNOSIS — Z8673 Personal history of transient ischemic attack (TIA), and cerebral infarction without residual deficits: Secondary | ICD-10-CM | POA: Diagnosis not present

## 2023-09-08 DIAGNOSIS — N1831 Chronic kidney disease, stage 3a: Secondary | ICD-10-CM | POA: Diagnosis not present

## 2023-09-08 DIAGNOSIS — E039 Hypothyroidism, unspecified: Secondary | ICD-10-CM | POA: Diagnosis not present

## 2023-09-08 DIAGNOSIS — K219 Gastro-esophageal reflux disease without esophagitis: Secondary | ICD-10-CM | POA: Diagnosis not present

## 2023-09-25 DIAGNOSIS — F17210 Nicotine dependence, cigarettes, uncomplicated: Secondary | ICD-10-CM | POA: Diagnosis not present

## 2023-09-25 DIAGNOSIS — I70211 Atherosclerosis of native arteries of extremities with intermittent claudication, right leg: Secondary | ICD-10-CM | POA: Diagnosis not present

## 2023-09-25 DIAGNOSIS — E119 Type 2 diabetes mellitus without complications: Secondary | ICD-10-CM | POA: Diagnosis not present

## 2023-09-25 DIAGNOSIS — G4700 Insomnia, unspecified: Secondary | ICD-10-CM | POA: Diagnosis not present

## 2023-09-25 DIAGNOSIS — I2510 Atherosclerotic heart disease of native coronary artery without angina pectoris: Secondary | ICD-10-CM | POA: Diagnosis not present

## 2023-09-25 DIAGNOSIS — I70212 Atherosclerosis of native arteries of extremities with intermittent claudication, left leg: Secondary | ICD-10-CM | POA: Diagnosis not present

## 2023-09-25 DIAGNOSIS — J449 Chronic obstructive pulmonary disease, unspecified: Secondary | ICD-10-CM | POA: Diagnosis not present

## 2023-09-25 DIAGNOSIS — M545 Low back pain, unspecified: Secondary | ICD-10-CM | POA: Diagnosis not present

## 2023-10-12 DIAGNOSIS — I10 Essential (primary) hypertension: Secondary | ICD-10-CM | POA: Diagnosis not present

## 2023-10-12 DIAGNOSIS — Z7982 Long term (current) use of aspirin: Secondary | ICD-10-CM | POA: Diagnosis not present

## 2023-10-12 DIAGNOSIS — R0902 Hypoxemia: Secondary | ICD-10-CM | POA: Diagnosis not present

## 2023-10-12 DIAGNOSIS — Z0389 Encounter for observation for other suspected diseases and conditions ruled out: Secondary | ICD-10-CM | POA: Diagnosis not present

## 2023-10-12 DIAGNOSIS — I6501 Occlusion and stenosis of right vertebral artery: Secondary | ICD-10-CM | POA: Diagnosis not present

## 2023-10-12 DIAGNOSIS — G319 Degenerative disease of nervous system, unspecified: Secondary | ICD-10-CM | POA: Diagnosis not present

## 2023-10-12 DIAGNOSIS — R9089 Other abnormal findings on diagnostic imaging of central nervous system: Secondary | ICD-10-CM | POA: Diagnosis not present

## 2023-10-12 DIAGNOSIS — R55 Syncope and collapse: Secondary | ICD-10-CM | POA: Diagnosis not present

## 2023-10-12 DIAGNOSIS — Z8673 Personal history of transient ischemic attack (TIA), and cerebral infarction without residual deficits: Secondary | ICD-10-CM | POA: Diagnosis not present

## 2023-10-12 DIAGNOSIS — J449 Chronic obstructive pulmonary disease, unspecified: Secondary | ICD-10-CM | POA: Diagnosis not present

## 2023-10-12 DIAGNOSIS — R2990 Unspecified symptoms and signs involving the nervous system: Secondary | ICD-10-CM | POA: Diagnosis not present

## 2023-10-12 DIAGNOSIS — I2510 Atherosclerotic heart disease of native coronary artery without angina pectoris: Secondary | ICD-10-CM | POA: Diagnosis not present

## 2023-10-12 DIAGNOSIS — E039 Hypothyroidism, unspecified: Secondary | ICD-10-CM | POA: Diagnosis not present

## 2023-10-12 DIAGNOSIS — F17210 Nicotine dependence, cigarettes, uncomplicated: Secondary | ICD-10-CM | POA: Diagnosis not present

## 2023-10-12 DIAGNOSIS — Z20822 Contact with and (suspected) exposure to covid-19: Secondary | ICD-10-CM | POA: Diagnosis not present

## 2023-10-12 DIAGNOSIS — I6621 Occlusion and stenosis of right posterior cerebral artery: Secondary | ICD-10-CM | POA: Diagnosis not present

## 2023-10-12 DIAGNOSIS — Z1152 Encounter for screening for COVID-19: Secondary | ICD-10-CM | POA: Diagnosis not present

## 2023-10-12 DIAGNOSIS — G9340 Encephalopathy, unspecified: Secondary | ICD-10-CM | POA: Diagnosis not present

## 2023-10-12 DIAGNOSIS — Z885 Allergy status to narcotic agent status: Secondary | ICD-10-CM | POA: Diagnosis not present

## 2023-10-12 DIAGNOSIS — Z7902 Long term (current) use of antithrombotics/antiplatelets: Secondary | ICD-10-CM | POA: Diagnosis not present

## 2023-10-12 DIAGNOSIS — K219 Gastro-esophageal reflux disease without esophagitis: Secondary | ICD-10-CM | POA: Diagnosis not present

## 2023-10-12 DIAGNOSIS — I700 Atherosclerosis of aorta: Secondary | ICD-10-CM | POA: Diagnosis not present

## 2023-10-12 DIAGNOSIS — R29818 Other symptoms and signs involving the nervous system: Secondary | ICD-10-CM | POA: Diagnosis not present

## 2023-10-12 DIAGNOSIS — R4020 Unspecified coma: Secondary | ICD-10-CM | POA: Diagnosis not present

## 2023-10-12 DIAGNOSIS — I6782 Cerebral ischemia: Secondary | ICD-10-CM | POA: Diagnosis not present

## 2023-10-12 DIAGNOSIS — E782 Mixed hyperlipidemia: Secondary | ICD-10-CM | POA: Diagnosis not present

## 2023-10-12 DIAGNOSIS — I672 Cerebral atherosclerosis: Secondary | ICD-10-CM | POA: Diagnosis not present

## 2023-10-12 DIAGNOSIS — Z79899 Other long term (current) drug therapy: Secondary | ICD-10-CM | POA: Diagnosis not present

## 2023-10-12 DIAGNOSIS — I6521 Occlusion and stenosis of right carotid artery: Secondary | ICD-10-CM | POA: Diagnosis not present

## 2023-10-13 DIAGNOSIS — R413 Other amnesia: Secondary | ICD-10-CM | POA: Diagnosis not present

## 2023-10-13 DIAGNOSIS — Z8673 Personal history of transient ischemic attack (TIA), and cerebral infarction without residual deficits: Secondary | ICD-10-CM | POA: Diagnosis not present

## 2023-10-13 DIAGNOSIS — R296 Repeated falls: Secondary | ICD-10-CM | POA: Diagnosis not present

## 2023-10-13 DIAGNOSIS — I6522 Occlusion and stenosis of left carotid artery: Secondary | ICD-10-CM | POA: Diagnosis not present

## 2023-10-13 DIAGNOSIS — R29818 Other symptoms and signs involving the nervous system: Secondary | ICD-10-CM | POA: Diagnosis not present

## 2023-10-13 DIAGNOSIS — F17210 Nicotine dependence, cigarettes, uncomplicated: Secondary | ICD-10-CM | POA: Diagnosis not present

## 2023-10-13 DIAGNOSIS — E039 Hypothyroidism, unspecified: Secondary | ICD-10-CM | POA: Diagnosis not present

## 2023-10-13 DIAGNOSIS — J449 Chronic obstructive pulmonary disease, unspecified: Secondary | ICD-10-CM | POA: Diagnosis not present

## 2023-10-13 DIAGNOSIS — Z79899 Other long term (current) drug therapy: Secondary | ICD-10-CM | POA: Diagnosis not present

## 2023-10-13 DIAGNOSIS — E782 Mixed hyperlipidemia: Secondary | ICD-10-CM | POA: Diagnosis not present

## 2023-10-13 DIAGNOSIS — I10 Essential (primary) hypertension: Secondary | ICD-10-CM | POA: Diagnosis not present

## 2023-10-21 DIAGNOSIS — I10 Essential (primary) hypertension: Secondary | ICD-10-CM | POA: Diagnosis not present

## 2023-10-21 DIAGNOSIS — Z8673 Personal history of transient ischemic attack (TIA), and cerebral infarction without residual deficits: Secondary | ICD-10-CM | POA: Diagnosis not present

## 2023-11-04 DIAGNOSIS — Z780 Asymptomatic menopausal state: Secondary | ICD-10-CM | POA: Diagnosis not present

## 2023-11-04 DIAGNOSIS — M810 Age-related osteoporosis without current pathological fracture: Secondary | ICD-10-CM | POA: Diagnosis not present

## 2023-11-04 DIAGNOSIS — M85852 Other specified disorders of bone density and structure, left thigh: Secondary | ICD-10-CM | POA: Diagnosis not present

## 2023-11-06 DIAGNOSIS — M545 Low back pain, unspecified: Secondary | ICD-10-CM | POA: Diagnosis not present

## 2023-11-06 DIAGNOSIS — G459 Transient cerebral ischemic attack, unspecified: Secondary | ICD-10-CM | POA: Diagnosis not present

## 2023-11-06 DIAGNOSIS — I70213 Atherosclerosis of native arteries of extremities with intermittent claudication, bilateral legs: Secondary | ICD-10-CM | POA: Diagnosis not present

## 2023-11-06 DIAGNOSIS — J449 Chronic obstructive pulmonary disease, unspecified: Secondary | ICD-10-CM | POA: Diagnosis not present

## 2023-11-06 DIAGNOSIS — Z515 Encounter for palliative care: Secondary | ICD-10-CM | POA: Diagnosis not present

## 2023-11-06 DIAGNOSIS — I2510 Atherosclerotic heart disease of native coronary artery without angina pectoris: Secondary | ICD-10-CM | POA: Diagnosis not present

## 2023-11-06 DIAGNOSIS — Z8673 Personal history of transient ischemic attack (TIA), and cerebral infarction without residual deficits: Secondary | ICD-10-CM | POA: Diagnosis not present

## 2023-11-13 DIAGNOSIS — J449 Chronic obstructive pulmonary disease, unspecified: Secondary | ICD-10-CM | POA: Diagnosis not present

## 2023-12-01 DIAGNOSIS — I700 Atherosclerosis of aorta: Secondary | ICD-10-CM | POA: Diagnosis not present

## 2023-12-01 DIAGNOSIS — Z122 Encounter for screening for malignant neoplasm of respiratory organs: Secondary | ICD-10-CM | POA: Diagnosis not present

## 2023-12-01 DIAGNOSIS — F17210 Nicotine dependence, cigarettes, uncomplicated: Secondary | ICD-10-CM | POA: Diagnosis not present

## 2023-12-01 DIAGNOSIS — I2584 Coronary atherosclerosis due to calcified coronary lesion: Secondary | ICD-10-CM | POA: Diagnosis not present

## 2023-12-04 DIAGNOSIS — Z515 Encounter for palliative care: Secondary | ICD-10-CM | POA: Diagnosis not present

## 2023-12-04 DIAGNOSIS — I2510 Atherosclerotic heart disease of native coronary artery without angina pectoris: Secondary | ICD-10-CM | POA: Diagnosis not present

## 2023-12-04 DIAGNOSIS — I70213 Atherosclerosis of native arteries of extremities with intermittent claudication, bilateral legs: Secondary | ICD-10-CM | POA: Diagnosis not present

## 2023-12-04 DIAGNOSIS — J449 Chronic obstructive pulmonary disease, unspecified: Secondary | ICD-10-CM | POA: Diagnosis not present

## 2023-12-04 DIAGNOSIS — I69311 Memory deficit following cerebral infarction: Secondary | ICD-10-CM | POA: Diagnosis not present

## 2023-12-04 DIAGNOSIS — M545 Low back pain, unspecified: Secondary | ICD-10-CM | POA: Diagnosis not present

## 2023-12-04 DIAGNOSIS — Z8673 Personal history of transient ischemic attack (TIA), and cerebral infarction without residual deficits: Secondary | ICD-10-CM | POA: Diagnosis not present

## 2023-12-04 DIAGNOSIS — G459 Transient cerebral ischemic attack, unspecified: Secondary | ICD-10-CM | POA: Diagnosis not present

## 2023-12-10 DIAGNOSIS — N1831 Chronic kidney disease, stage 3a: Secondary | ICD-10-CM | POA: Diagnosis not present

## 2023-12-10 DIAGNOSIS — E038 Other specified hypothyroidism: Secondary | ICD-10-CM | POA: Diagnosis not present

## 2023-12-10 DIAGNOSIS — J449 Chronic obstructive pulmonary disease, unspecified: Secondary | ICD-10-CM | POA: Diagnosis not present

## 2023-12-10 DIAGNOSIS — Z8673 Personal history of transient ischemic attack (TIA), and cerebral infarction without residual deficits: Secondary | ICD-10-CM | POA: Diagnosis not present

## 2023-12-10 DIAGNOSIS — E1122 Type 2 diabetes mellitus with diabetic chronic kidney disease: Secondary | ICD-10-CM | POA: Diagnosis not present

## 2023-12-10 DIAGNOSIS — K219 Gastro-esophageal reflux disease without esophagitis: Secondary | ICD-10-CM | POA: Diagnosis not present

## 2023-12-10 DIAGNOSIS — I10 Essential (primary) hypertension: Secondary | ICD-10-CM | POA: Diagnosis not present

## 2023-12-11 DIAGNOSIS — J449 Chronic obstructive pulmonary disease, unspecified: Secondary | ICD-10-CM | POA: Diagnosis not present

## 2023-12-30 DIAGNOSIS — I6522 Occlusion and stenosis of left carotid artery: Secondary | ICD-10-CM | POA: Diagnosis not present

## 2023-12-30 DIAGNOSIS — I2510 Atherosclerotic heart disease of native coronary artery without angina pectoris: Secondary | ICD-10-CM | POA: Diagnosis not present

## 2023-12-30 DIAGNOSIS — I639 Cerebral infarction, unspecified: Secondary | ICD-10-CM | POA: Diagnosis not present

## 2023-12-30 DIAGNOSIS — I10 Essential (primary) hypertension: Secondary | ICD-10-CM | POA: Diagnosis not present

## 2024-01-22 DIAGNOSIS — Z8673 Personal history of transient ischemic attack (TIA), and cerebral infarction without residual deficits: Secondary | ICD-10-CM | POA: Diagnosis not present

## 2024-01-22 DIAGNOSIS — I2510 Atherosclerotic heart disease of native coronary artery without angina pectoris: Secondary | ICD-10-CM | POA: Diagnosis not present

## 2024-01-22 DIAGNOSIS — G459 Transient cerebral ischemic attack, unspecified: Secondary | ICD-10-CM | POA: Diagnosis not present

## 2024-01-22 DIAGNOSIS — Z515 Encounter for palliative care: Secondary | ICD-10-CM | POA: Diagnosis not present

## 2024-01-22 DIAGNOSIS — I69311 Memory deficit following cerebral infarction: Secondary | ICD-10-CM | POA: Diagnosis not present

## 2024-01-22 DIAGNOSIS — I70213 Atherosclerosis of native arteries of extremities with intermittent claudication, bilateral legs: Secondary | ICD-10-CM | POA: Diagnosis not present

## 2024-01-22 DIAGNOSIS — J449 Chronic obstructive pulmonary disease, unspecified: Secondary | ICD-10-CM | POA: Diagnosis not present

## 2024-01-22 DIAGNOSIS — M545 Low back pain, unspecified: Secondary | ICD-10-CM | POA: Diagnosis not present

## 2024-03-10 DIAGNOSIS — N1831 Chronic kidney disease, stage 3a: Secondary | ICD-10-CM | POA: Diagnosis not present

## 2024-03-10 DIAGNOSIS — I2510 Atherosclerotic heart disease of native coronary artery without angina pectoris: Secondary | ICD-10-CM | POA: Diagnosis not present

## 2024-03-10 DIAGNOSIS — I700 Atherosclerosis of aorta: Secondary | ICD-10-CM | POA: Diagnosis not present

## 2024-03-10 DIAGNOSIS — Z0000 Encounter for general adult medical examination without abnormal findings: Secondary | ICD-10-CM | POA: Diagnosis not present

## 2024-03-10 DIAGNOSIS — E038 Other specified hypothyroidism: Secondary | ICD-10-CM | POA: Diagnosis not present

## 2024-03-10 DIAGNOSIS — K219 Gastro-esophageal reflux disease without esophagitis: Secondary | ICD-10-CM | POA: Diagnosis not present

## 2024-03-10 DIAGNOSIS — J449 Chronic obstructive pulmonary disease, unspecified: Secondary | ICD-10-CM | POA: Diagnosis not present

## 2024-03-10 DIAGNOSIS — I10 Essential (primary) hypertension: Secondary | ICD-10-CM | POA: Diagnosis not present

## 2024-03-10 DIAGNOSIS — E1122 Type 2 diabetes mellitus with diabetic chronic kidney disease: Secondary | ICD-10-CM | POA: Diagnosis not present

## 2024-03-10 DIAGNOSIS — Z8673 Personal history of transient ischemic attack (TIA), and cerebral infarction without residual deficits: Secondary | ICD-10-CM | POA: Diagnosis not present

## 2024-03-16 DIAGNOSIS — Z515 Encounter for palliative care: Secondary | ICD-10-CM | POA: Diagnosis not present

## 2024-03-16 DIAGNOSIS — M545 Low back pain, unspecified: Secondary | ICD-10-CM | POA: Diagnosis not present

## 2024-03-16 DIAGNOSIS — J449 Chronic obstructive pulmonary disease, unspecified: Secondary | ICD-10-CM | POA: Diagnosis not present

## 2024-03-16 DIAGNOSIS — I2510 Atherosclerotic heart disease of native coronary artery without angina pectoris: Secondary | ICD-10-CM | POA: Diagnosis not present

## 2024-03-16 DIAGNOSIS — G4700 Insomnia, unspecified: Secondary | ICD-10-CM | POA: Diagnosis not present

## 2024-03-16 DIAGNOSIS — Z8673 Personal history of transient ischemic attack (TIA), and cerebral infarction without residual deficits: Secondary | ICD-10-CM | POA: Diagnosis not present

## 2024-03-16 DIAGNOSIS — I69311 Memory deficit following cerebral infarction: Secondary | ICD-10-CM | POA: Diagnosis not present

## 2024-03-16 DIAGNOSIS — I70213 Atherosclerosis of native arteries of extremities with intermittent claudication, bilateral legs: Secondary | ICD-10-CM | POA: Diagnosis not present

## 2024-03-29 DIAGNOSIS — I10 Essential (primary) hypertension: Secondary | ICD-10-CM | POA: Diagnosis not present

## 2024-03-29 DIAGNOSIS — M48061 Spinal stenosis, lumbar region without neurogenic claudication: Secondary | ICD-10-CM | POA: Diagnosis not present

## 2024-03-29 DIAGNOSIS — E782 Mixed hyperlipidemia: Secondary | ICD-10-CM | POA: Diagnosis not present

## 2024-03-29 DIAGNOSIS — F17200 Nicotine dependence, unspecified, uncomplicated: Secondary | ICD-10-CM | POA: Diagnosis not present

## 2024-03-29 DIAGNOSIS — M51362 Other intervertebral disc degeneration, lumbar region with discogenic back pain and lower extremity pain: Secondary | ICD-10-CM | POA: Diagnosis not present

## 2024-03-29 DIAGNOSIS — J449 Chronic obstructive pulmonary disease, unspecified: Secondary | ICD-10-CM | POA: Diagnosis not present

## 2024-03-29 DIAGNOSIS — I6522 Occlusion and stenosis of left carotid artery: Secondary | ICD-10-CM | POA: Diagnosis not present

## 2024-03-29 DIAGNOSIS — I2510 Atherosclerotic heart disease of native coronary artery without angina pectoris: Secondary | ICD-10-CM | POA: Diagnosis not present

## 2024-03-29 DIAGNOSIS — I639 Cerebral infarction, unspecified: Secondary | ICD-10-CM | POA: Diagnosis not present

## 2024-05-30 DIAGNOSIS — Z87891 Personal history of nicotine dependence: Secondary | ICD-10-CM | POA: Diagnosis not present

## 2024-05-30 DIAGNOSIS — R911 Solitary pulmonary nodule: Secondary | ICD-10-CM | POA: Diagnosis not present

## 2024-06-07 DIAGNOSIS — I639 Cerebral infarction, unspecified: Secondary | ICD-10-CM | POA: Diagnosis not present

## 2024-06-07 DIAGNOSIS — I6523 Occlusion and stenosis of bilateral carotid arteries: Secondary | ICD-10-CM | POA: Diagnosis not present

## 2024-06-07 DIAGNOSIS — M6281 Muscle weakness (generalized): Secondary | ICD-10-CM | POA: Diagnosis not present

## 2024-06-07 DIAGNOSIS — G8929 Other chronic pain: Secondary | ICD-10-CM | POA: Diagnosis not present

## 2024-06-07 DIAGNOSIS — D509 Iron deficiency anemia, unspecified: Secondary | ICD-10-CM | POA: Diagnosis not present

## 2024-06-07 DIAGNOSIS — Z79899 Other long term (current) drug therapy: Secondary | ICD-10-CM | POA: Diagnosis not present

## 2024-06-07 DIAGNOSIS — Z8673 Personal history of transient ischemic attack (TIA), and cerebral infarction without residual deficits: Secondary | ICD-10-CM | POA: Diagnosis not present

## 2024-06-07 DIAGNOSIS — I10 Essential (primary) hypertension: Secondary | ICD-10-CM | POA: Diagnosis not present

## 2024-06-07 DIAGNOSIS — I6521 Occlusion and stenosis of right carotid artery: Secondary | ICD-10-CM | POA: Diagnosis not present

## 2024-06-07 DIAGNOSIS — R531 Weakness: Secondary | ICD-10-CM | POA: Diagnosis not present

## 2024-06-07 DIAGNOSIS — E782 Mixed hyperlipidemia: Secondary | ICD-10-CM | POA: Diagnosis not present

## 2024-06-07 DIAGNOSIS — R413 Other amnesia: Secondary | ICD-10-CM | POA: Diagnosis not present

## 2024-06-07 DIAGNOSIS — M549 Dorsalgia, unspecified: Secondary | ICD-10-CM | POA: Diagnosis not present

## 2024-06-07 DIAGNOSIS — T50905A Adverse effect of unspecified drugs, medicaments and biological substances, initial encounter: Secondary | ICD-10-CM | POA: Diagnosis not present

## 2024-06-07 DIAGNOSIS — I2510 Atherosclerotic heart disease of native coronary artery without angina pectoris: Secondary | ICD-10-CM | POA: Diagnosis not present

## 2024-06-07 DIAGNOSIS — E039 Hypothyroidism, unspecified: Secondary | ICD-10-CM | POA: Diagnosis not present

## 2024-06-07 DIAGNOSIS — I252 Old myocardial infarction: Secondary | ICD-10-CM | POA: Diagnosis not present

## 2024-06-07 DIAGNOSIS — E119 Type 2 diabetes mellitus without complications: Secondary | ICD-10-CM | POA: Diagnosis not present

## 2024-06-07 DIAGNOSIS — R52 Pain, unspecified: Secondary | ICD-10-CM | POA: Diagnosis not present

## 2024-06-07 DIAGNOSIS — R600 Localized edema: Secondary | ICD-10-CM | POA: Diagnosis not present

## 2024-06-07 DIAGNOSIS — M545 Low back pain, unspecified: Secondary | ICD-10-CM | POA: Diagnosis not present

## 2024-06-07 DIAGNOSIS — M51369 Other intervertebral disc degeneration, lumbar region without mention of lumbar back pain or lower extremity pain: Secondary | ICD-10-CM | POA: Diagnosis not present

## 2024-06-07 DIAGNOSIS — M4726 Other spondylosis with radiculopathy, lumbar region: Secondary | ICD-10-CM | POA: Diagnosis not present

## 2024-06-07 DIAGNOSIS — R29898 Other symptoms and signs involving the musculoskeletal system: Secondary | ICD-10-CM | POA: Diagnosis not present

## 2024-06-07 DIAGNOSIS — J449 Chronic obstructive pulmonary disease, unspecified: Secondary | ICD-10-CM | POA: Diagnosis not present

## 2024-06-07 DIAGNOSIS — Z743 Need for continuous supervision: Secondary | ICD-10-CM | POA: Diagnosis not present

## 2024-06-07 DIAGNOSIS — I498 Other specified cardiac arrhythmias: Secondary | ICD-10-CM | POA: Diagnosis not present

## 2024-06-07 DIAGNOSIS — M47814 Spondylosis without myelopathy or radiculopathy, thoracic region: Secondary | ICD-10-CM | POA: Diagnosis not present

## 2024-06-07 DIAGNOSIS — W19XXXA Unspecified fall, initial encounter: Secondary | ICD-10-CM | POA: Diagnosis not present

## 2024-06-07 DIAGNOSIS — R296 Repeated falls: Secondary | ICD-10-CM | POA: Diagnosis not present

## 2024-06-07 DIAGNOSIS — R55 Syncope and collapse: Secondary | ICD-10-CM | POA: Diagnosis not present

## 2024-06-07 DIAGNOSIS — M5030 Other cervical disc degeneration, unspecified cervical region: Secondary | ICD-10-CM | POA: Diagnosis not present

## 2024-06-07 DIAGNOSIS — R200 Anesthesia of skin: Secondary | ICD-10-CM | POA: Diagnosis not present

## 2024-06-07 DIAGNOSIS — I6503 Occlusion and stenosis of bilateral vertebral arteries: Secondary | ICD-10-CM | POA: Diagnosis not present

## 2024-06-07 DIAGNOSIS — Z5986 Financial insecurity: Secondary | ICD-10-CM | POA: Diagnosis not present

## 2024-06-07 DIAGNOSIS — Z7982 Long term (current) use of aspirin: Secondary | ICD-10-CM | POA: Diagnosis not present

## 2024-06-07 DIAGNOSIS — M48061 Spinal stenosis, lumbar region without neurogenic claudication: Secondary | ICD-10-CM | POA: Diagnosis not present

## 2024-06-07 DIAGNOSIS — K219 Gastro-esophageal reflux disease without esophagitis: Secondary | ICD-10-CM | POA: Diagnosis not present

## 2024-06-07 DIAGNOSIS — Z955 Presence of coronary angioplasty implant and graft: Secondary | ICD-10-CM | POA: Diagnosis not present

## 2024-06-07 DIAGNOSIS — F17210 Nicotine dependence, cigarettes, uncomplicated: Secondary | ICD-10-CM | POA: Diagnosis not present

## 2024-06-07 DIAGNOSIS — Z7984 Long term (current) use of oral hypoglycemic drugs: Secondary | ICD-10-CM | POA: Diagnosis not present

## 2024-06-07 DIAGNOSIS — R202 Paresthesia of skin: Secondary | ICD-10-CM | POA: Diagnosis not present

## 2024-06-07 DIAGNOSIS — W010XXA Fall on same level from slipping, tripping and stumbling without subsequent striking against object, initial encounter: Secondary | ICD-10-CM | POA: Diagnosis not present

## 2024-06-07 DIAGNOSIS — Z981 Arthrodesis status: Secondary | ICD-10-CM | POA: Diagnosis not present

## 2024-06-08 DIAGNOSIS — M4726 Other spondylosis with radiculopathy, lumbar region: Secondary | ICD-10-CM | POA: Diagnosis not present

## 2024-06-08 DIAGNOSIS — M47814 Spondylosis without myelopathy or radiculopathy, thoracic region: Secondary | ICD-10-CM | POA: Diagnosis not present

## 2024-06-08 DIAGNOSIS — Z79899 Other long term (current) drug therapy: Secondary | ICD-10-CM | POA: Diagnosis not present

## 2024-06-08 DIAGNOSIS — K219 Gastro-esophageal reflux disease without esophagitis: Secondary | ICD-10-CM | POA: Diagnosis not present

## 2024-06-08 DIAGNOSIS — I2510 Atherosclerotic heart disease of native coronary artery without angina pectoris: Secondary | ICD-10-CM | POA: Diagnosis not present

## 2024-06-08 DIAGNOSIS — Z7982 Long term (current) use of aspirin: Secondary | ICD-10-CM | POA: Diagnosis not present

## 2024-06-08 DIAGNOSIS — M48061 Spinal stenosis, lumbar region without neurogenic claudication: Secondary | ICD-10-CM | POA: Diagnosis not present

## 2024-06-08 DIAGNOSIS — Z8673 Personal history of transient ischemic attack (TIA), and cerebral infarction without residual deficits: Secondary | ICD-10-CM | POA: Diagnosis not present

## 2024-06-08 DIAGNOSIS — D509 Iron deficiency anemia, unspecified: Secondary | ICD-10-CM | POA: Diagnosis not present

## 2024-06-08 DIAGNOSIS — R55 Syncope and collapse: Secondary | ICD-10-CM | POA: Diagnosis not present

## 2024-06-08 DIAGNOSIS — E119 Type 2 diabetes mellitus without complications: Secondary | ICD-10-CM | POA: Diagnosis not present

## 2024-06-08 DIAGNOSIS — R29898 Other symptoms and signs involving the musculoskeletal system: Secondary | ICD-10-CM | POA: Diagnosis not present

## 2024-06-08 DIAGNOSIS — R296 Repeated falls: Secondary | ICD-10-CM | POA: Diagnosis not present

## 2024-06-08 DIAGNOSIS — I10 Essential (primary) hypertension: Secondary | ICD-10-CM | POA: Diagnosis not present

## 2024-06-08 DIAGNOSIS — M549 Dorsalgia, unspecified: Secondary | ICD-10-CM | POA: Diagnosis not present

## 2024-06-08 DIAGNOSIS — E039 Hypothyroidism, unspecified: Secondary | ICD-10-CM | POA: Diagnosis not present

## 2024-06-08 DIAGNOSIS — J449 Chronic obstructive pulmonary disease, unspecified: Secondary | ICD-10-CM | POA: Diagnosis not present

## 2024-06-08 DIAGNOSIS — Z7984 Long term (current) use of oral hypoglycemic drugs: Secondary | ICD-10-CM | POA: Diagnosis not present

## 2024-06-08 DIAGNOSIS — G8929 Other chronic pain: Secondary | ICD-10-CM | POA: Diagnosis not present

## 2024-06-08 DIAGNOSIS — J42 Unspecified chronic bronchitis: Secondary | ICD-10-CM | POA: Diagnosis not present

## 2024-06-08 DIAGNOSIS — E782 Mixed hyperlipidemia: Secondary | ICD-10-CM | POA: Diagnosis not present

## 2024-06-08 DIAGNOSIS — R200 Anesthesia of skin: Secondary | ICD-10-CM | POA: Diagnosis not present

## 2024-06-09 DIAGNOSIS — E119 Type 2 diabetes mellitus without complications: Secondary | ICD-10-CM | POA: Diagnosis not present

## 2024-06-09 DIAGNOSIS — R296 Repeated falls: Secondary | ICD-10-CM | POA: Diagnosis not present

## 2024-06-09 DIAGNOSIS — E782 Mixed hyperlipidemia: Secondary | ICD-10-CM | POA: Diagnosis not present

## 2024-06-09 DIAGNOSIS — I10 Essential (primary) hypertension: Secondary | ICD-10-CM | POA: Diagnosis not present

## 2024-06-09 DIAGNOSIS — M47814 Spondylosis without myelopathy or radiculopathy, thoracic region: Secondary | ICD-10-CM | POA: Diagnosis not present

## 2024-06-09 DIAGNOSIS — D509 Iron deficiency anemia, unspecified: Secondary | ICD-10-CM | POA: Diagnosis not present

## 2024-06-09 DIAGNOSIS — Z7984 Long term (current) use of oral hypoglycemic drugs: Secondary | ICD-10-CM | POA: Diagnosis not present

## 2024-06-09 DIAGNOSIS — K219 Gastro-esophageal reflux disease without esophagitis: Secondary | ICD-10-CM | POA: Diagnosis not present

## 2024-06-09 DIAGNOSIS — R55 Syncope and collapse: Secondary | ICD-10-CM | POA: Diagnosis not present

## 2024-06-09 DIAGNOSIS — M549 Dorsalgia, unspecified: Secondary | ICD-10-CM | POA: Diagnosis not present

## 2024-06-09 DIAGNOSIS — Z79899 Other long term (current) drug therapy: Secondary | ICD-10-CM | POA: Diagnosis not present

## 2024-06-09 DIAGNOSIS — M48061 Spinal stenosis, lumbar region without neurogenic claudication: Secondary | ICD-10-CM | POA: Diagnosis not present

## 2024-06-09 DIAGNOSIS — Z8673 Personal history of transient ischemic attack (TIA), and cerebral infarction without residual deficits: Secondary | ICD-10-CM | POA: Diagnosis not present

## 2024-06-09 DIAGNOSIS — G8929 Other chronic pain: Secondary | ICD-10-CM | POA: Diagnosis not present

## 2024-06-09 DIAGNOSIS — E039 Hypothyroidism, unspecified: Secondary | ICD-10-CM | POA: Diagnosis not present

## 2024-06-09 DIAGNOSIS — R200 Anesthesia of skin: Secondary | ICD-10-CM | POA: Diagnosis not present

## 2024-06-09 DIAGNOSIS — Z7982 Long term (current) use of aspirin: Secondary | ICD-10-CM | POA: Diagnosis not present

## 2024-06-09 DIAGNOSIS — I2510 Atherosclerotic heart disease of native coronary artery without angina pectoris: Secondary | ICD-10-CM | POA: Diagnosis not present

## 2024-06-09 DIAGNOSIS — M4726 Other spondylosis with radiculopathy, lumbar region: Secondary | ICD-10-CM | POA: Diagnosis not present

## 2024-06-09 DIAGNOSIS — J449 Chronic obstructive pulmonary disease, unspecified: Secondary | ICD-10-CM | POA: Diagnosis not present

## 2024-06-10 DIAGNOSIS — E039 Hypothyroidism, unspecified: Secondary | ICD-10-CM | POA: Diagnosis not present

## 2024-06-10 DIAGNOSIS — J449 Chronic obstructive pulmonary disease, unspecified: Secondary | ICD-10-CM | POA: Diagnosis not present

## 2024-06-10 DIAGNOSIS — M47814 Spondylosis without myelopathy or radiculopathy, thoracic region: Secondary | ICD-10-CM | POA: Diagnosis not present

## 2024-06-10 DIAGNOSIS — I10 Essential (primary) hypertension: Secondary | ICD-10-CM | POA: Diagnosis not present

## 2024-06-10 DIAGNOSIS — K219 Gastro-esophageal reflux disease without esophagitis: Secondary | ICD-10-CM | POA: Diagnosis not present

## 2024-06-10 DIAGNOSIS — Z8673 Personal history of transient ischemic attack (TIA), and cerebral infarction without residual deficits: Secondary | ICD-10-CM | POA: Diagnosis not present

## 2024-06-10 DIAGNOSIS — Z7982 Long term (current) use of aspirin: Secondary | ICD-10-CM | POA: Diagnosis not present

## 2024-06-10 DIAGNOSIS — R200 Anesthesia of skin: Secondary | ICD-10-CM | POA: Diagnosis not present

## 2024-06-10 DIAGNOSIS — R296 Repeated falls: Secondary | ICD-10-CM | POA: Diagnosis not present

## 2024-06-10 DIAGNOSIS — Z7984 Long term (current) use of oral hypoglycemic drugs: Secondary | ICD-10-CM | POA: Diagnosis not present

## 2024-06-10 DIAGNOSIS — M4726 Other spondylosis with radiculopathy, lumbar region: Secondary | ICD-10-CM | POA: Diagnosis not present

## 2024-06-10 DIAGNOSIS — R55 Syncope and collapse: Secondary | ICD-10-CM | POA: Diagnosis not present

## 2024-06-10 DIAGNOSIS — E782 Mixed hyperlipidemia: Secondary | ICD-10-CM | POA: Diagnosis not present

## 2024-06-10 DIAGNOSIS — I2510 Atherosclerotic heart disease of native coronary artery without angina pectoris: Secondary | ICD-10-CM | POA: Diagnosis not present

## 2024-06-10 DIAGNOSIS — M48061 Spinal stenosis, lumbar region without neurogenic claudication: Secondary | ICD-10-CM | POA: Diagnosis not present

## 2024-06-10 DIAGNOSIS — G8929 Other chronic pain: Secondary | ICD-10-CM | POA: Diagnosis not present

## 2024-06-10 DIAGNOSIS — Z79899 Other long term (current) drug therapy: Secondary | ICD-10-CM | POA: Diagnosis not present

## 2024-06-10 DIAGNOSIS — E119 Type 2 diabetes mellitus without complications: Secondary | ICD-10-CM | POA: Diagnosis not present

## 2024-06-10 DIAGNOSIS — D509 Iron deficiency anemia, unspecified: Secondary | ICD-10-CM | POA: Diagnosis not present

## 2024-06-10 NOTE — Nursing Note (Signed)
 Pt's daughter called and stated that her friend is at the front entrance and ready to pick up pt. Daughter asked me various questions about discharge, which we went over verbally on the phone. Called DC unit to take pt to daughter's friend's personal vehicle.  Carla [name] 06/10/2024 / 5:41 PM

## 2024-06-10 NOTE — Discharge Summary (Signed)
 [facility] [employer] [facility]  Discharge Summary  PCP: No primary care provider on file. Discharge Details   Admit date:         06/07/2024 Discharge date and time:       06/10/2024 Hospital LOS:    4  days  Active Hospital Problems   Diagnosis Date Noted POA  . *Back pain associated with peripheral numbness 06/08/2024 Yes  . Memory loss 06/08/2024 Yes  . Coronary artery disease involving native coronary artery of native heart without angina pectoris 06/08/2024 Yes  . Frequent falls 06/08/2024 Not Applicable  . Hypothyroidism 05/21/2022 Yes  . Recurrent major depression 05/21/2022 Yes  . Mixed hyperlipidemia 05/13/2022 Yes  . Type 2 diabetes mellitus without complication (*) 01/15/2022 Yes  . Anxiety disorder 01/15/2022 Yes  . GERD (gastroesophageal reflux disease) 01/15/2022 Yes  . Morbid obesity (*) 03/07/2016 Yes  . Essential hypertension 06/29/2015 Yes  . COPD (chronic obstructive pulmonary disease) (*) 03/05/2012 Yes    Resolved Hospital Problems  No resolved problems to display.      Current Discharge Medication List     START taking these medications      Details  ferrous sulfate 325 (65 FE) MG tablet  Take one tablet (325 mg dose) by mouth 2 (two) times daily.       CONTINUE these medications which have NOT CHANGED      Details  aspirin  EC tablet Commonly known as: ECOTRIN LOW DOSE  Take one tablet (81 mg dose) by mouth daily.   atorvastatin  80 mg tablet Commonly known as: LIPITOR   Take one tablet (80 mg dose) by mouth every evening.   chlorthalidone  25 mg tablet  Take one tablet (25 mg dose) by mouth daily.   clopidogrel  bisulfate 75 mg tablet Commonly known as: PLAVIX   Take one tablet (75 mg dose) by mouth daily.   DULoxetine  HCl 60 mg capsule Commonly known as: CYMBALTA   Take one capsule (60 mg dose) by mouth daily.   fenofibrate 145 MG tablet  Take one tablet (145 mg dose) by mouth daily.    fluticasone -salmeterol 250-50 mcg/dose Aepb inhalation powder Commonly known as: ADVAIR  DISKUS/WIXELA  Inhale one puff into the lungs 2 (two) times daily.   levothyroxine  sodium 100 mcg tablet Commonly known as: SYNTHROID ,LEVOTHROID,LEVOXYL   Take one tablet (100 mcg dose) by mouth daily.   lisinopril  20 mg tablet Commonly known as: PRINIVIL ,ZESTRIL   Take one tablet (20 mg dose) by mouth daily.   metformin 1000 MG tablet Commonly known as: GLUCOPHAGE  Take one tablet (1,000 mg dose) by mouth 2 (two) times daily with meals.   metoprolol  succinate 25 mg 24 hr tablet Commonly known as: TOPROL -XL  Take one tablet (25 mg dose) by mouth daily.   nitroGLYCERIN  0.4 mg SL tablet Commonly known as: NITROSTAT   Place one tablet (0.4 mg dose) under the tongue every 5 (five) minutes as needed for Chest pain.   pantoprazole  sodium 20 mg tablet Commonly known as: PROTONIX   Take one tablet (20 mg dose) by mouth 2 (two) times daily.   QUEtiapine fumarate 25 mg tablet Commonly known as: SEROQUEL  Take one tablet (25 mg dose) by mouth every evening.   tiZANidine 4 mg tablet Commonly known as: ZANAFLEX  Take one tablet (4 mg dose) by mouth every 6 (six) hours as needed.      * You might also be taking other medications not listed above. If you have questions about any of your  other medications, talk to the person who prescribed them or your Primary Care Provider.             History of Present Illness:  Carla [name] is a 73 y.o. 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  The patient presented to [facility] due to sudden onset weakness in both  lower extremities. She reported that she lives alone and ambulates with a walker  at baseline. She was moving some heavy boxes yesterday when she suddenly  developed an excruciating pain in  her lower back. She described it as the the worst  pain ever. Her legs gave out and just before she hit the floor she lost consciousness briefly.  When she regained consciousness she called a friend as she was unable to get up from  the floor. She endorsed memory deficits due to her multiple strokes. For instance she is unsure of  her age. She stated she was 73 yo, but records state 73 yo. She reported that she has  2 birth certificates with different dates (1952 and [birth year]). MRI Lumbar spine obtained at the OSH showed new modic type 1 signal intensity at L3-L4. Chronic canal stenosis at L2-L3, and L3- L4. 3 small disc bulges, no cord compression. CT head: no acute process CXR: no acute cardiopulmonary finding. Wbc: 13.2K (no L shift), Mg: 1.1, Cr: 1.1, Hgb 9.9. The patient was evaluated by a Tele-neurologist who made an assessment of ischemic stroke  and recommended transfer to a tertiary hospital for MRI brain, ECHO, and neuro eval.   The patient received NS, iv morphine , in benadryl  and iv Zofran  at the OSH prior to transfer.  The patient was admitted to the hospitalist service.  The hospitalization course is described below in a problem based format:-   Hospital Course     = Acute on chronic low back pain followed by lower extremity numbness/weakness    s/p lumbar fusion surgery L4/L5 in 2017      MRI lumbar spine done at outside hospital revealed chronic lumbar canal stenosis at the L2/L3 and L3/L4 with 3 small disc bulges but no cord compression.  MRI T-spine was unrevealing.  There were no focal findings on exam to suggest myelopathy as per neurologist.  Neurologist believes that patient had exacerbation of her chronic low back pain with worsening findings now with L3/L4 and this could be causing her lower extremity numbness/weakness as well.  Neurosurgery  evaluated and recommended outpatient follow-up.    -TSH normal.  Vitamin B12 and folate  level pending at discharge    - PT/OT  followed.  Home health PT and OT being set up.   = Brief syncopal episode     This is believed to be induced by pain.  CT head at outside hospital was acute negative.     -No further syncopal episodes     -No arrhythmia on telemetry except for 1 or 2 instances of bradycardia which were likely not relevant to the episode.   = Hypomagnesemia    Repleted with supplement.     = Chronic anemia due to iron deficiency    Hemoglobin around 9.2.  6 months ago, hemoglobin was around 10.  Anemia seems to be chronic.  Unclear etiology.  No evidence of bleeding anywhere.   - Iron level very low.  Discharged on oral iron.     -  Follow-up vitamin B12 and folate levels   -Recommend follow-up CBC in a week   = CAD    Continue aspirin  81 mg daily, Plavix  75 Mg daily, Toprol -XL 25 Mg daily, Lipitor  80 Mg daily   = h/o CVA, multiple    Continue aspirin  81 Mg daily, Plavix  75 Mg daily, Lipitor  80 Mg daily   = Hypertension     Continue Toprol -XL 25 Mg daily, lisinopril  20 Mg daily   = Hyperlipidemia    Continue Lipitor  80 Mg daily, fenofibrate 160 Mg daily   = COPD     Continue Symbicort inhaler twice daily, as needed albuterol    = Hypothyroidism     Continue Synthroid  100 mcg daily   = Depression     Continue Cymbalta  60 Mg daily  = Concerns about cognition due to multiple previous strokes    - Patient seems to have the capacity to decide to decline SNF and return home    - She lives alone at home.  In terms of home safety, I discussed with the daughter who reports that she is very comfortable with patient staying at home and there have not been any concerns regarding her safety.  I do recommend that they follow-up with PCP for detailed neurocognitive testing and see if she needs increased assistance at home.  Consider home safety evaluation along with home health  Patient is being discharged with home health in a stable condition. At the time of discharge, she reports that her leg  weakness/numbness has improved and she is able to ambulate with assist device.  Physical Exam: Vitals:   06/10/24 1107  BP: 118/67  Pulse: 69  Resp: 16  Temp: 98.2 F (36.8 C)  SpO2: 95%   Physical Exam Cardiovascular:     Rate and Rhythm: Normal rate.     Heart sounds: Normal heart sounds.  Pulmonary:     Breath sounds: Normal breath sounds.  Abdominal:     Palpations: Abdomen is soft.  Neurological:     Mental Status: She is alert and oriented to person, place, and time.      Labs on Discharge:  Recent Labs    Units 06/10/24 0114 06/09/24 0043 06/08/24 0244  WBC thou/mcL 12.2* 12.0* 13.8*  HGB gm/dL 9.9* 9.3* 9.2*  HCT % 69.8* 28.9* 29.2*  PLT thou/mcL 355 357 367   Recent Labs    Units 06/09/24 0043 06/08/24 0244  NA mmol/L 140 139  K mmol/L 3.6* 3.8  CL mmol/L 100 103  CO2 mmol/L 26 25  BUN mg/dL 26 26  CREATININE mg/dL 8.80* 8.94*  CALCIUM  mg/dL 8.8 8.9  PHOS mg/dL  --  3.6  No results for input(s): BILITOT, AST, ALT, ALKPHOS, PROT, ALBUMIN , LDH, URICACID in the last 168 hours. Recent Labs    Units 06/08/24 0540 06/08/24 0244  TSH mcIU/mL  --  2.21  HGBA1C % 5.1  --    No results for input(s): LABPROT, INR, PTT in the last 168 hours. No results for input(s): CHOL, LDL, HDL, TRIG in the last 168 hours. No results for input(s): TROPONIN, CK in the last 168 hours.  Invalid input(s): CK-MB     Post Hospital Care   Discharge Procedure Orders  Ambulatory Referral to Home Health  Referral Priority: Routine Referral Type: Home Health Care  Referral Reason: Evaluate and Return  Requested Specialty: Home Health Services  Number of Visits Requested: 1 Expiration Date: 12/06/24   Consistent Carbohydrate Diet (diabetic)   Notify Physician  and Call 911 if you experience any of the following: Sudden numbness or weakness, sudden trouble speaking, vision problems, trouble walking, sudden severe headache with no known  cause.   Activity as tolerated   Discharge instructions  Order Comments: -Follow-up with neurosurgeon in office as scheduled  - Recommend follow-up with primary care doctor in 1 week.  Recommend follow-up blood work including complete blood count and basic metabolic panel in 1 week.   - Recommend detailed neurocognitive testing as outpatient to evaluate memory/cognition.  If there is any concern about home safety, recommend 24-hour assistance at home.    Diet: Consistent Carbohydrate 2 gm NA Consistent Carbohydrate Diet (diabetic)   Code Status:   Full Code   Followup appointments:  Future Appointments  Date Time Provider Department Center  06/16/2024  1:00 PM Mallory [place], ACNP [facility] None     Time spent in discharge process:  total time spent 60 minutes   Electronically signed: Duayne Feller, MD 06/10/2024 / 1:03 PM   *Some images could not be shown.

## 2024-06-10 NOTE — Nursing Note (Signed)
 Pt discharging home with home health via family transport. IV removed and AVS gone over with patient. Discharge unit to be called.

## 2024-06-14 ENCOUNTER — Telehealth: Payer: Self-pay | Admitting: *Deleted

## 2024-06-14 NOTE — Transitions of Care (Post Inpatient/ED Visit) (Signed)
   06/14/2024  Name: Carla [name] MRN: [id number] DOB: [birth year]  Today's TOC FU Call Status: Today's TOC FU Call Status:: Unsuccessful Call (1st Attempt) Unsuccessful Call (1st Attempt) Date: 06/14/24  Attempted to reach the patient regarding the most recent Inpatient/ED visit.  Follow Up Plan: Additional outreach attempts will be made to reach the patient to complete the Transitions of Care (Post Inpatient/ED visit) call.   Cathlean Headland BSN RN [facility] Health Care Management Coordinator [email or web address] Direct Dial: [phone]  Fax: [phone] Website: [email or web address]

## 2024-06-15 ENCOUNTER — Telehealth: Payer: Self-pay

## 2024-06-15 NOTE — Transitions of Care (Post Inpatient/ED Visit) (Signed)
   06/15/2024  Name: FRANCYS BOLIN MRN: [id number] DOB: [birth year]  Today's TOC FU Call Status: Today's TOC FU Call Status:: Unsuccessful Call (2nd Attempt) Unsuccessful Call (2nd Attempt) Date: 06/15/24  Attempted to reach the patient regarding the most recent Inpatient/ED visit.  Follow Up Plan: Additional outreach attempts will be made to reach the patient to complete the Transitions of Care (Post Inpatient/ED visit) call.   Shona Prow RN, CCM [place]  [facility]Population [facility] RN Care Manager [phone]

## 2024-06-16 ENCOUNTER — Telehealth: Payer: Self-pay

## 2024-06-16 NOTE — Transitions of Care (Post Inpatient/ED Visit) (Signed)
   06/16/2024  Name: Carla [name] MRN: [id number] DOB: [birth year]  Today's TOC FU Call Status: Today's TOC FU Call Status:: Unsuccessful Call (3rd Attempt) Unsuccessful Call (3rd Attempt) Date: 06/16/24  Attempted to reach the patient regarding the most recent Inpatient/ED visit.  Follow Up Plan: No further outreach attempts will be made at this time. We have been unable to contact the patient.  Jermisha Hoffart J. Ankita Newcomer RN, MSN [facility], [facility] RN Care Manager Direct Dial: [phone]  Fax: [phone] Website: [email or web address]

## 2024-06-20 DIAGNOSIS — J449 Chronic obstructive pulmonary disease, unspecified: Secondary | ICD-10-CM | POA: Diagnosis not present

## 2024-06-20 DIAGNOSIS — Z515 Encounter for palliative care: Secondary | ICD-10-CM | POA: Diagnosis not present

## 2024-06-20 DIAGNOSIS — I69311 Memory deficit following cerebral infarction: Secondary | ICD-10-CM | POA: Diagnosis not present

## 2024-06-20 DIAGNOSIS — I2510 Atherosclerotic heart disease of native coronary artery without angina pectoris: Secondary | ICD-10-CM | POA: Diagnosis not present

## 2024-06-20 DIAGNOSIS — I10 Essential (primary) hypertension: Secondary | ICD-10-CM | POA: Diagnosis not present

## 2024-06-20 DIAGNOSIS — F17210 Nicotine dependence, cigarettes, uncomplicated: Secondary | ICD-10-CM | POA: Diagnosis not present

## 2024-06-20 DIAGNOSIS — Z8673 Personal history of transient ischemic attack (TIA), and cerebral infarction without residual deficits: Secondary | ICD-10-CM | POA: Diagnosis not present

## 2024-06-20 DIAGNOSIS — I700 Atherosclerosis of aorta: Secondary | ICD-10-CM | POA: Diagnosis not present

## 2024-06-20 DIAGNOSIS — L0390 Cellulitis, unspecified: Secondary | ICD-10-CM | POA: Diagnosis not present

## 2024-06-20 DIAGNOSIS — K219 Gastro-esophageal reflux disease without esophagitis: Secondary | ICD-10-CM | POA: Diagnosis not present

## 2024-06-20 DIAGNOSIS — M545 Low back pain, unspecified: Secondary | ICD-10-CM | POA: Diagnosis not present

## 2024-06-20 DIAGNOSIS — R55 Syncope and collapse: Secondary | ICD-10-CM | POA: Diagnosis not present

## 2024-06-20 DIAGNOSIS — N1831 Chronic kidney disease, stage 3a: Secondary | ICD-10-CM | POA: Diagnosis not present

## 2024-06-20 DIAGNOSIS — E1122 Type 2 diabetes mellitus with diabetic chronic kidney disease: Secondary | ICD-10-CM | POA: Diagnosis not present

## 2024-06-20 DIAGNOSIS — I70213 Atherosclerosis of native arteries of extremities with intermittent claudication, bilateral legs: Secondary | ICD-10-CM | POA: Diagnosis not present

## 2024-06-20 DIAGNOSIS — E038 Other specified hypothyroidism: Secondary | ICD-10-CM | POA: Diagnosis not present

## 2024-09-20 NOTE — TOC CM/SW Note (Signed)
 Transition of Care [facility]) CM/SW Note    09/20/2024: CSW updated by [facility] social worker that they have open case with pt and were alerted pt was at [facility]. CSW reviewed chart and notes pt is not admitted at [facility] but at [facility]. Katie with APS updated and she will follow with [facility] SW. CSW unable to place note with today date, chart reviewed on 09/20/2024 by both Asberry Peoples, RN CM and Lucie Lunger, LCSW in attempt to place note in chart with todays date.

## 2024-09-21 NOTE — Nursing Note (Signed)
 09/21/24 0917  Note Type   Note Type Treatment Note  Occupational Therapy Session Duration  OT Individual [mins] 8  OT Co-Treatment [mins] 16  Reason for Co-treatment To safely progress mobility  General  Communication Preference Verbal  Patient / Caregiver reports Pt was greeted supine in bed, sleeping, but agreeable to participate in OT treatment with encouragement.  Pain Comments No c/o pain at rest or with activity.  Cognition  Cognition Follows 1-step commands  Orientation Disoriented to time  Safety Judgment Decreased awareness of need for safety  Activity Tolerance  Activity Tolerance Tolerated treatment well;Limited by fatigue;Limited by Mental Status  ADLs  Grooming Set Up Assist;Performed seated  Bathing Max assist;Performed at bed level;Performed seated  Toileting Mod assist (Anticipate that pt would be able to get up to the [facility] for toileting ADLs with assist from nursing staff.)  UB Dressing Min assist;Performed seated  LB Dressing Min assist;Performed seated (To don non-slip hospital socks while seated at the EOB with fair carryover of adaptive techniques, such as figure 4 positioning.)  Functional Mobility  Transfers Contact Guard assist  Bed Mobility  Min assist (supine <> sit transfers)  Ambulation  Level of Assistance Minimal assist, patient does 75% or more (x18 feet within hospital room)  Assistive Device Rolling walker  Additional Interventions  Skilled Interventions Performed ADL re-training: Min A with LB dressing while pt was seated at the EOB to don a fresh pair of non-slip hospital socks. Pt was educated on adaptive techniques, such as the figure 4 position, to increase ease/decrease time required to complete LB dressing. Set up for grooming (washing face) also while seated at the EOB. Supervision for sitting balance during all dynamic activities. Pt was able to progress out of bed mobility to ambulate up to the sink (x18 feet) but did not complete any  additional grooming ADLs in stance.  Patient Goals  SHORT GOAL #1 Pt will be mod I for LB ADLs while seated with education in adaptive equipment and techniques 3/3 trials.  Date Established  09/16/24  Time Frame  2 weeks  Plan  In Progress  SHORT GOAL #2 Pt will be mod I for grooming ADLs while seated 3/3 trials after set up of supplies.  Date Established  09/16/24  Time Frame  2 weeks  Plan  In Progress  SHORT GOAL #3 Pt will be min A for all aspects of toileting using AD / DME for support 3/3 trials.  Date Established  09/16/24  Time Frame  2 weeks  Plan  In Progress  SHORT GOAL #4 Pt will increase BUE strength by 1 mm grade for improved ADLs, transfers, and mobility.  Date Established  09/16/24  Time Frame  2 weeks  Plan  In Progress  Long Term Goal #1 Pt will be supervision for her grooming routine in stance at the sink with no LOB 3/3 trials after education in fall prevention and energy conservation strategies 3/3 trials.  Time Frame 4 weeks  Assessment  Problem List Decreased activity tolerance;Decreased cognition;Decreased coordination;Decreased endurance;Decreased mobility;Decreased range of motion;Decreased strength;Fall risk;Impaired ADLs;Impaired balance;Impaired fine motor skills;Decreased safety awareness  Assessment Pt tolerated OT treatment methods fairly well this morning after requiring encouragement to participate. Pt was motivated to complete LB dressing as independently as possible to don non-slip hospital socks while seated at the EOB. Pt was only somewhat receptive to education in adaptive techniques for LB dressing to promote her independence, conserve energy, and decrease risk of falls. Pt also completed  grooming ADL from this position with set up of supplies, but I anticipate that she will be able to progress to grooming in stance at the sink during her next OT treatment session as she was able to ambulate 18 feet within her hospital room with FWW and Min A for safety  on this date. At close of session, pt requested to return to bed, seizure precautions in place, all needs met, call bell in reach, and bed alarm armed. Thank you for this consult.  Therapy Recommendations for Safe Patient Handling  Transfer  1 person assist  Ambulation Walker;1 person assist  Bathroom Bedside Commode;1 person assist  End of Session  Patient at end of session All needs in reach;Alarm activated;In bed;Lines intact

## 2024-09-21 NOTE — Nursing Note (Signed)
°   09/21/24 1547  Note Type  Note Type Treatment Note  Physical Therapy Session Duration  PT Individual [mins] 8  PT Co-Treatment [mins] 16  Direct Care Non-Bill [mins] 8  Reason for Co-treatment To safely progress mobility  General  Activity Tolerance Tolerated treatment well;Limited by fatigue  Equipment / Environment Vascular access (PIV, TLC, Port-a-cath, PICC);Foley  Communication Preference Verbal  Patient reports Pt agreeable to participate in therapy.  Pain Comments No c/o pain.  Cognition  Cognition Follows 1-step commands  Cognition comment Drowsy, delayed responses, follows 1-step directions  Orientation Disoriented to time  Bed Mobility  Bed Mobility Supine to Sit;Sit to Supine  Supine to Sit assistance level Minimal assist, patient does 75% or more  Sit to Supine assistance level Minimal assist, patient does 75% or more  Bed Mobility comments Supine<>sit with Min A required for trunk support and LE advancement.  Transfers  Transfers Sit to Stand  Sit to Stand assistance level Contact guard assist, steadying assist  Transfer comments STS from bed with CGA using FWW. Cues for hand placement, pushing up from bed with UE's, and use of AD for support upon standing.  Ambulation  Level of Assistance Minimal assist, patient does 75% or more (x18 feet within hospital room)  Assistive Device Rolling walker  Distance Ambulated (ft) 18 ft  Ambulation comments Pt ambulated 18 feet with FWW and Min A due to impaired balance and AD negotiation.  Stairs  Stairs NT  Therapy Recommendations for Safe Patient Handling  Transfer  1 person assist  Ambulation Walker;1 person assist  Bathroom Bedside Commode;1 person assist  Goals  SHORT GOAL #1 Pt will perform all aspects of bed mobility with CGA.  Date Established  09/16/24  Time Frame  2 weeks  Plan  In Progress  SHORT GOAL #2 Pt will perform functional transfers with CGA with use of least restrictive assistive device.  Date  Established  09/16/24  Time Frame  2 weeks  Plan  Met  SHORT GOAL #3 Pt will ambulate 15 feet with Min A with use of least restrictive assistive device.  Date Established  09/16/24  Time Frame  2 weeks  Plan  Met  Long Term Goal #1 Pt will ambulate 100 feet with CGA with use of least restrictive assistive device.  Time Frame 4 weeks  Assessment  Problem List Obesity;Decreased strength;Impaired balance;Decreased mobility;Gait deviation;Fall risk;Impaired ADLs;Decreased cognition;Decreased safety awareness;Decreased endurance  Assessment  Pt received supine in bed and agreeable to participate in therapy with encouragement. Pt demonstrated bed mobility (supine<>sit) with Min A. Performed STS from bed with CGA using FWW. Maintained sitting balance at the EOB with supervision. Pt ambulated 18 feet with FWW and Min A. Pt will continue to benefit from SNF for short-term rehab upon discharge to maximize safety and functional independence and reduce risk of falls. Upon completion of session, pt supine in bed, bed alarm armed, call bell in reach, all lines in place, and all needs met.  End of Session  Patient at end of session All needs in reach;Alarm activated;In bed;Lines intact

## 2024-09-21 NOTE — Care Plan (Signed)
 Shift Summary MAP decreased from 100 to 79 over the course of the shift. Aseptic technique and environmental surveillance were maintained, with no new skin abnormalities documented beyond existing MASD in abdominal folds. Bed alarm remained on and hourly visual checks confirmed patient stayed in bed, with no falls or injuries reported. Patient was calm and agreeable to therapy, but required assistance with communication and demonstrated cognitive impairments. Seizure precautions were maintained and no seizure activity was documented during the shift.  Absence of Infection Signs and Symptoms: Temperature increased slightly but remained within normal limits, and aseptic technique and environmental surveillance were maintained throughout the shift. SOFA scores remained at 0, and no new skin abnormalities were documented beyond existing MASD in abdominal folds.  Absence of Fall and Fall-Related Injury: Bed alarm was consistently on and hourly visual checks confirmed patient remained in bed, with no falls or injuries reported during the shift.  Maintenance of Behavioral Health Symptom Control: Patient was calm and agreeable to therapy, but required assistance to express feelings and thoughts, and demonstrated impaired memory, attention, and problem solving throughout the shift.  Blood Pressure in Desired Range: MAP decreased from 100 to 79 over the shift, with no interventions documented for blood pressure management.  Maintenance of Seizure Control: Seizure precautions were maintained and no seizure activity was documented during the shift.

## 2024-09-21 NOTE — Nursing Note (Signed)
 Missed Session 09/21/2024 1020  Note Type Contact  Missed Therapy Reason Patient fatigue; patient did not want to wake up in order to participate in re-evaluation. Will re-attempt as appropriate  ST Missed Minutes 0

## 2024-09-21 NOTE — Nursing Note (Signed)
 Pt discussed during rounds and updates were provided. Pt did work with therapy today. SW will submit pt's insurance authorization once SW has updated notes.   SW went and spoke with pt and provided updates. Pt agreeable to the bed offer at [facility] in [place]. SW informed pt, SW will submit pt's insurance authorization for approval and will follow up with updates regarding pt's discharge planning.   SW spoke with NP Brutus and provided updates.  SW submitted pt's insurance authorization in the portal for SNF placement at [facility]. Will continue to follow up.

## 2024-09-21 NOTE — Consults (Signed)
 Speech Language Pathology Cognitive Linguistic Evaluation Re-Evaluation (09/21/24 1554)   Patient Name:  [name]      Medical Record Number: [id number]  Date of Birth: 08/25/1951 Sex: Female          SLP Treatment Diagnosis: Cognitive linguistic deficits Cognitive Linguistic Deficits Activity Tolerance: Patient tolerated treatment well  Assessment  Patient continues to present with cognitive linguistic deficits during an admission for dehydration. Patient was originally given the SLUMS on 12/8 about 24 hours after a witnessed seizure. At that time, patient scored a 2/30. Today, the SLUMS was re-administered and the patient scored a 12/30. While this is an improvement from her original score, it is still indicative of dementia like deficits. Patient's relative strengths are in identifying location (year is 2025 and in the state of [place]), doing simple calculations (addition), and auditory comprehension. Patient continues to have deficits in short term memory, delayed recall, problem solving, and generative naming. SLP to continue following up to address these deficits. Recommending SNF at discharge to continue receiving treatment.   Prognosis: Poor Barriers to Discharge: Cognitive deficits, Decreased caregiver support, Decreased safety awareness, Impulsivity, Inability to safely perform ADLS, Motivation, Poor insight into deficits, Severity of deficits   Plan of Care SLP Daily Frequency: 1x per day, 3-4 days per week Planned Treatment Duration : 09/30/24  Treatment Goals: LTG 1: The patient will participate in swallowing and memory treatments to promote participate in health care outcomes Time Frame: 2 weeks Short Term Goal 1: Patient will tolerate soft and bite sized/6 diet with no s/sx of aspiration   Date Established : 09/16/24 Time Frame : 1 week Short Term Goal 2: Patient will participate in oral care activities to promote oral health  Date Established : 09/16/24 Time Frame  : 1 week Short Term Goal 3: The patient will recall 3 out of 4 orientation categories (person, time, place, and situation) with minimal verbal cues Date Established : 09/19/24 Time Frame : 1 week   Short Term Goal 4: The patient will recall 1 piece of functional information (doctor's name, nurse's name, room number) with use of visual reminders   Date Established : 09/19/24 Time Frame : 1 week  Planned Interventions: Activities/Participation-Based Treatment, Dysphagia Intervention, Patient education        Recommendations: Post Acute SLP Discharge Recommendations: Would benefit from assistance with complex tasks, 5x weekly, SNF Complex Tasks Needing Assistance: Managing Finances, Medication Management, Meal Preparation Recommended Compensatory Techniques: Decreased rate of speech (speaker), Extra processing time, Orientation aids (clocks/calendars etc.), Prescription glasses/contact lenses, Repetition, Tactile/visual cueing  Subjective Patient alert and required cueing to participate in re-evaluation Prior Function: Delayed for chronological age Lives With: Family Educational Status: High school diploma Communication Preference: Verbal Pain: no s/s of pain   Allergies: Coconut, Coconut flavor, Hydralazine  analogues, [place] mushroom extract, Mushroom, Venom-honey bee, Amitriptyline, Opioids - morphine  analogues, Bee sting kit, Diclofenac , Ketorolac , Ketorolac  tromethamine , Nefazodone, Tramadol, and Trazodone Current Medications[1]  Patient Active Problem List   Diagnosis Date Noted   Dehydration 09/16/2024   Leukocytosis 09/16/2024   Stroke-like symptoms 10/12/2023   Cellulitis of left foot 11/04/2022   Hypothyroidism 11/04/2022   History of multiple cerebrovascular accidents (CVAs) 11/04/2022   Mixed hyperlipidemia 05/13/2022   Morbid obesity (CMS-HCC) 05/13/2022   Encephalopathy 04/17/2022   Internal carotid artery stenosis, left 04/16/2022   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    (CMS-HCC) 02/03/2022   Altered mental status 01/30/2022   Asthma (HHS-HCC)    HTN (hypertension)  COPD (chronic obstructive pulmonary disease) (CMS-HCC)    OA (osteoarthritis) of knee    GERD (gastroesophageal reflux disease)    Tobacco use disorder    Memory loss    DDD (degenerative disc disease), cervical    DDD (degenerative disc disease), lumbar    Spinal stenosis, lumbar    Frequent falls    Coronary artery disease involving native coronary artery of native heart without angina pectoris    Past Medical History[2]   Past Surgical History[3] Social History   Tobacco Use   Smoking status: Every Day    Current packs/day: 0.50    Average packs/day: 1 pack/day for 20.8 years (20.4 ttl pk-yrs)    Types: Cigarettes    Start date: 12/01/2003    Passive exposure: Past   Smokeless tobacco: Never  Substance Use Topics   Alcohol use: Not Currently   Family History[4]  General:  Medical Tests / Procedures Comments: CXR and CT Head negative for acute findings Equipment/Environment: Vascular access (PIV, TLC, Port-a-cath, PICC Precautions: Falls precautions Required Braces or Orthoses: Non-applicable  Objective Swallow Status: Dysphagia- Oral Motor Speech: WNL  Attention: Sustained attention is limited, Complex attention is limited Memory: Memory impairment disrupts daily routines, Recall is limited for people encountered, Memory for new information is limited Problem Solving: Problem solving is impaired, Planning skills are impaired, Requires moderate assist to complete basic functional tasks, Requires maximal assist to complete complex functional tasks, Requires moderate assist for simple calculations, Abstract reasoning is impaired Safety/Judgement: Safety/Judgement is impaired for routine tasks Insight/Mental Flexibility/Initiation/Processing: Moderate impulsivity, Insight is impaired, Initiation is impaired, Processing speed  is delayed, Mildly disorganized Verbal Expression: Generative naming is impaired, Repetition is impaired Auditory Comprehension: Yes/No Questions Within Functional Limits, Basic Questions Within Functional Limits, Complex Questions impaired, Single-step commands Within Functional Limits, Multi-step commands impaired, Basic Conversation Comprehension is within functional limits, Complex Conversation Comprehension is impaired Pragmatics/Prosody: Topic initiation is impaired, Topic Maintenance is impaired, Pragmatics are impaired, Prosody is within functional limits, Affect is impaired, Eye contact is impaired, Turn Taking is within functional limits Written Expression: Right hand dominant Reading Comprehension: Reading ability was not formally assessed   Patient at end of session: All needs in reach, Alarm activated, In bed, Lines intact, Notified Provider  Speech Therapy Session Duration  SLP Individual [mins]: 20  I attest that I have reviewed the above information. Signed: Con Norfolk, SLP Filed 09/21/2024       [1] Current Facility-Administered Medications  Medication Dose Route Frequency Provider Last Rate Last Admin   acetaminophen  (TYLENOL ) tablet 650 mg  650 mg Oral Q4H PRN Verdie Darin Shelvy Lonni, MD       albuterol  2.5 mg /3 mL (0.083 %) nebulizer solution 2.5 mg  2.5 mg Nebulization Q4H PRN Dyer, Andre St. Christopher, MD       aluminum-magnesium  hydroxide-simethicone (MAALOX MAX) 80-80-8 mg/mL oral suspension  30 mL Oral Q4H PRN Dyer, Andre St. Christopher, MD       aspirin  (ECOTRIN) tablet 81 mg  81 mg Oral Daily Verdie Darin Shelvy Lonni, MD   81 mg at 09/21/24 9156   atorvastatin  (LIPITOR ) tablet 80 mg  80 mg Oral QPM Verdie Darin Shelvy Lonni, MD   80 mg at 09/19/24 1739   [Provider Hold] chlorthalidone  (HYGROTON ) tablet 25 mg  25 mg Oral Daily Verdie Darin Shelvy Lonni, MD   25 mg at 09/19/24 0858   clopidogrel  (PLAVIX ) tablet 75 mg  75 mg Oral Daily  Verdie Darin Shelvy Lonni, MD  75 mg at 09/21/24 9157   DULoxetine  (CYMBALTA ) DR capsule 60 mg  60 mg Oral Daily Verdie Darin Shelvy Lonni, MD   60 mg at 09/21/24 9156   enoxaparin  (LOVENOX ) syringe 40 mg  40 mg Subcutaneous Q24H SCH Pace, Hagan Eggleston, FNP   40 mg at 09/21/24 9156   fenofibrate (TRICOR) tablet 145 mg  145 mg Oral Daily Verdie Darin Shelvy Lonni, MD   145 mg at 09/21/24 9157   guaiFENesin  (ROBITUSSIN) oral syrup  200 mg Oral Q4H PRN Dyer, Andre St. Christopher, MD       HYDROcodone -acetaminophen  [facility]) 5-325 mg per tablet 1 tablet  1 tablet Oral Q4H PRN Verdie Darin Shelvy Lonni, MD   1 tablet at 09/16/24 2044   levETIRAcetam (KEPPRA) tablet 500 mg  500 mg Oral BID Hugelmeyer, Alexis, DO   500 mg at 09/21/24 9156   levothyroxine  (SYNTHROID ) tablet 100 mcg  100 mcg Oral daily Pace, Hagan Eggleston, FNP   100 mcg at 09/21/24 9385   lisinopril  (PRINIVIL ,ZESTRIL ) tablet 20 mg  20 mg Oral Daily Verdie Darin Shelvy Lonni, MD   20 mg at 09/21/24 9157   magnesium  citrate solution 120 mL  120 mL Oral Daily PRN Verdie Darin Shelvy Lonni, MD       magnesium  oxide (MAG-OX) tablet 400 mg  400 mg Oral TID Pace, Hagan Eggleston, FNP   400 mg at 09/21/24 1438   melatonin tablet 3 mg  3 mg Oral Nightly PRN Dyer, Andre St. Christopher, MD       metoPROLOL  succinate (Toprol -XL) 24 hr tablet 50 mg  50 mg Oral Daily Pace, Hagan Eggleston, FNP   50 mg at 09/21/24 0843   naloxone  (NARCAN ) injection 0.1 mg  0.1 mg Intravenous Q5 Min PRN Verdie Darin Shelvy Lonni, MD       nicotine  (NICODERM CQ ) 21 mg/24 hr patch 1 patch  1 patch Transdermal Daily PRN Pace, Hagan Eggleston, FNP   1 patch at 09/21/24 0903   nicotine  polacrilex (NICORETTE) gum 4 mg  4 mg Buccal Q1H PRN Dyer, Andre St. Christopher, MD       nitrofurantoin (macrocrystal-monohydrate) (MACROBID) 100 MG capsule 100 mg  100 mg Oral Q12H SCH Pace, Hagan Eggleston, FNP   100 mg at 09/21/24 9156   nitroglycerin   (NITROSTAT ) SL tablet 0.4 mg  0.4 mg Sublingual Q5 Min PRN Dyer, Andre St. Christopher, MD       nystatin (MYCOSTATIN) powder 1 Application  1 Application Topical TID Pace, Hagan Eggleston, FNP   1 Application at 09/21/24 1438   ondansetron  (ZOFRAN ) injection 4 mg  4 mg Intravenous Q8H PRN Dyer, Andre St. Christopher, MD       Or   ondansetron  (ZOFRAN ) injection 8 mg  8 mg Intravenous Q8H PRN Dyer, Andre St. Christopher, MD       polyethylene glycol (MIRALAX ) packet 17 g  17 g Oral Daily PRN Dyer, Andre St. Christopher, MD       prochlorperazine  (COMPAZINE ) injection 2.5 mg  2.5 mg Intravenous Q8H PRN Dyer, Andre St. Christopher, MD       Or   prochlorperazine  (COMPAZINE ) injection 5 mg  5 mg Intravenous Q8H PRN Dyer, Andre St. Christopher, MD       promethazine  (PHENERGAN ) suppository 12.5 mg  12.5 mg Rectal Q6H PRN Verdie Darin Shelvy Lonni, MD       Or   promethazine  (PHENERGAN ) suppository 25 mg  25 mg Rectal Q6H PRN Verdie Darin Shelvy Lonni, MD  QUEtiapine (SEROQUEL) tablet 25 mg  25 mg Oral Nightly Pace, Hagan Eggleston, FNP   25 mg at 09/20/24 2102   senna-docusate (PERICOLACE) 8.6-50 mg 1 tablet  1 tablet Oral Nightly Verdie Darin Shelvy Lonni, MD   1 tablet at 09/18/24 2055  [2] Past Medical History: Diagnosis Date   Asthma (HHS-HCC)    COPD (chronic obstructive pulmonary disease) (CMS-HCC)    Coronary artery disease involving native coronary artery of native heart without angina pectoris    DDD (degenerative disc disease), cervical    DDD (degenerative disc disease), lumbar    Frequent falls    GERD (gastroesophageal reflux disease)    HTN (hypertension)    Memory loss    MI (myocardial infarction) (CMS-HCC) 08/19/2016   MI/CAD sees Cardiology Dr. Claudene in [place] on [address]   OA (osteoarthritis) of knee    sees Dr. Van and needs TKR. MI/CAD on bloodthinner 08/2016 need to wait 6 mo   Spinal stenosis, lumbar    Severe L4-5 lumbar spinal  stenosis on MRI - Referred to Dr. Louis at [facility]   Stroke    (CMS-HCC)    Tobacco use disorder   [3] Past Surgical History: Procedure Laterality Date   CHOLECYSTECTOMY  03/12/2016   Dr. Nicholaus @ [facility]   CORONARY ANGIOPLASTY WITH STENT PLACEMENT  08/2016   for CAD @ [facility]   KNEE ARTHROSCOPY Right 2007   meniscal tear/OA  [4] Family History Problem Relation Age of Onset   Heart disease Father

## 2024-09-21 NOTE — Nursing Note (Signed)
°   09/21/24 1149  Rapid Rounds  Attendance Nursing;Physician;Advanced Practice Provider (APP);Case Manager;Dietitian/Nutrition Services;Occupational Therapy;Pharmacy;Physical Therapy;Respiratory Therapy;Social Work/Services;Speech Language Pathology  Today we still await: Placement process   Was accepted at [facility] Will need insurance authorization.

## 2024-09-22 NOTE — Care Plan (Signed)
 Shift Summary Aseptic technique and environmental surveillance were maintained. Bed alarm was consistently activated and hourly visual checks confirmed safety, with no falls or injuries documented.  Respiratory status remained stable with clear breath sounds and regular pattern, and patient independently performed cough and deep breathing exercises.  MAP remained at 100 mmHg with no interventions required for blood pressure management.  Seizure precautions were maintained and no seizure activity was observed during the shift.  Absence of Infection Signs and Symptoms: Aseptic technique was maintained and environmental surveillance was performed; SOFA scores remained at 0 throughout the shift, and mucous membranes were intact and pink. No new skin abnormalities were documented aside from pre-existing MASD and abrasions to the 2nd toe bilaterally.  Absence of Fall and Fall-Related Injury: Bed alarm remained activated and hourly visual checks confirmed presence in bed; no falls or injuries occurred during the shift.  Maintenance of COPD Symptom Control: Respiratory rate and SpO2 remained stable, breath sounds were clear, and respiratory pattern was regular and unlabored; patient performed cough and deep breathing independently.  Blood Pressure in Desired Range: MAP was documented at 100 mmHg and blood pressure was measured automatically; no interventions were required for blood pressure management during the shift.  Maintenance of Seizure Control: Seizure precautions were maintained with activity supervised and side rails padded; no seizure activity was noted during the shift.

## 2024-09-29 ENCOUNTER — Telehealth: Payer: Self-pay | Admitting: Diagnostic Neuroimaging

## 2024-09-29 ENCOUNTER — Encounter (HOSPITAL_COMMUNITY): Payer: Self-pay | Admitting: Emergency Medicine

## 2024-09-29 ENCOUNTER — Emergency Department (HOSPITAL_COMMUNITY)

## 2024-09-29 ENCOUNTER — Emergency Department (HOSPITAL_COMMUNITY)
Admission: EM | Admit: 2024-09-29 | Discharge: 2024-09-30 | Disposition: A | Source: Home / Self Care | Attending: Emergency Medicine | Admitting: Emergency Medicine

## 2024-09-29 ENCOUNTER — Other Ambulatory Visit: Payer: Self-pay

## 2024-09-29 DIAGNOSIS — R55 Syncope and collapse: Secondary | ICD-10-CM | POA: Diagnosis present

## 2024-09-29 DIAGNOSIS — Z7982 Long term (current) use of aspirin: Secondary | ICD-10-CM | POA: Insufficient documentation

## 2024-09-29 DIAGNOSIS — R7989 Other specified abnormal findings of blood chemistry: Secondary | ICD-10-CM | POA: Insufficient documentation

## 2024-09-29 DIAGNOSIS — Z7902 Long term (current) use of antithrombotics/antiplatelets: Secondary | ICD-10-CM | POA: Insufficient documentation

## 2024-09-29 HISTORY — DX: Unspecified convulsions: R56.9

## 2024-09-29 LAB — COMPREHENSIVE METABOLIC PANEL WITH GFR
ALT: 21 U/L (ref 0–44)
AST: 39 U/L (ref 15–41)
Albumin: 3.2 g/dL — ABNORMAL LOW (ref 3.5–5.0)
Alkaline Phosphatase: 52 U/L (ref 38–126)
Anion gap: 20 — ABNORMAL HIGH (ref 5–15)
BUN: 18 mg/dL (ref 8–23)
CO2: 19 mmol/L — ABNORMAL LOW (ref 22–32)
Calcium: 9.2 mg/dL (ref 8.9–10.3)
Chloride: 102 mmol/L (ref 98–111)
Creatinine, Ser: 1.32 mg/dL — ABNORMAL HIGH (ref 0.44–1.00)
GFR, Estimated: 43 mL/min — ABNORMAL LOW (ref >60)
Glucose, Bld: 106 mg/dL — ABNORMAL HIGH (ref 70–99)
Potassium: 3.4 mmol/L — ABNORMAL LOW (ref 3.5–5.1)
Sodium: 140 mmol/L (ref 135–145)
Total Bilirubin: 0.4 mg/dL (ref 0.0–1.2)
Total Protein: 5.9 g/dL — ABNORMAL LOW (ref 6.5–8.1)

## 2024-09-29 LAB — CBC
HCT: 36.4 % (ref 36.0–46.0)
Hemoglobin: 11.9 g/dL — ABNORMAL LOW (ref 12.0–15.0)
MCH: 29.2 pg (ref 26.0–34.0)
MCHC: 32.7 g/dL (ref 30.0–36.0)
MCV: 89.2 fL (ref 80.0–100.0)
Platelets: 429 K/uL — ABNORMAL HIGH (ref 150–400)
RBC: 4.08 MIL/uL (ref 3.87–5.11)
RDW: 17.8 % — ABNORMAL HIGH (ref 11.5–15.5)
WBC: 10.1 K/uL (ref 4.0–10.5)
nRBC: 0 % (ref 0.0–0.2)

## 2024-09-29 LAB — C DIFFICILE QUICK SCREEN W PCR REFLEX
C Diff antigen: NEGATIVE
C Diff interpretation: NOT DETECTED
C Diff toxin: NEGATIVE

## 2024-09-29 LAB — CBG MONITORING, ED: Glucose-Capillary: 113 mg/dL — ABNORMAL HIGH (ref 70–99)

## 2024-09-29 LAB — TROPONIN T, HIGH SENSITIVITY
Troponin T High Sensitivity: 58 ng/L — ABNORMAL HIGH (ref 0–19)
Troponin T High Sensitivity: 58 ng/L — ABNORMAL HIGH (ref 0–19)

## 2024-09-29 LAB — MAGNESIUM: Magnesium: 1.4 mg/dL — ABNORMAL LOW (ref 1.7–2.4)

## 2024-09-29 MED ORDER — SODIUM CHLORIDE 0.9 % IV BOLUS
1000.0000 mL | Freq: Once | INTRAVENOUS | Status: AC
  Administered 2024-09-29: 20:00:00 1000 mL via INTRAVENOUS

## 2024-09-29 MED ADMIN — Magnesium Sulfate IV Soln 2 GM/50ML (40 MG/ML): 2 g | INTRAVENOUS | @ 18:00:00 | NDC 63323010602

## 2024-09-29 MED FILL — Magnesium Sulfate IV Soln 2 GM/50ML (40 MG/ML): 2.0000 g | INTRAVENOUS | Qty: 50 | Status: AC

## 2024-09-29 NOTE — Telephone Encounter (Signed)
 Hey this patient has not bee seen since 03/26/22 and the other 2 visits was in 2016. We are not able to clarify cognitive status to get her discharged.

## 2024-09-29 NOTE — ED Notes (Signed)
 Pt taken to restroom to attempt urine specimen. Specimen contaminated by liquid stool. Provider notified and stool specimen taken to lab for testing. Will attempt urine at a later time.

## 2024-09-29 NOTE — ED Notes (Signed)
 CN aware of Social worker statement. CN called [facility] and child psychotherapist was gone. Primary nurse for the pt stated that she also got that message but there was not any legal documentation in the chart. That primary nurse called the daughter to inform her of the pt going to the ED per her chart. CN checked the [facility] here at ED and daughter is on as emergency contact.

## 2024-09-29 NOTE — ED Provider Notes (Incomplete)
 [place] EMERGENCY DEPARTMENT AT [facility] Provider Note   CSN: [id number] Arrival date & time: 09/29/24  1616     Patient presents with: Loss of Consciousness   Carla [name] is a 73 y.o. female.  Patient brought in by EMS after seizure at her facility while getting a shower.  Patient had been assisted by staff.  No reported head strike.  Patient states she has had seizures before.  MRI without any acute infarct.  She denies any complaints.  Of note she was just discharged from [facility] after a fall and seizure.  Started on Keppra, no prior history of seizure.  had some mildly elevated troponins.  UTI that was treated.  {Add pertinent medical, surgical, social history, OB history to YEP:67052} The history is provided by the patient and the EMS personnel.  Loss of Consciousness Episode history:  Single Most recent episode:  Today Associated symptoms: no chest pain, no difficulty breathing and no shortness of breath        Prior to Admission medications  Medication Sig Start Date End Date Taking? Authorizing Provider  albuterol  (PROVENTIL  HFA;VENTOLIN  HFA) 108 (90 BASE) MCG/ACT inhaler Inhale 2 puffs into the lungs every 6 (six) hours as needed for wheezing. 06/21/13   Gosrani, Nimish C, MD  aspirin  81 MG chewable tablet Chew 81 mg by mouth every morning.    [provider]  atorvastatin  (LIPITOR ) 80 MG tablet Take 1 tablet (80 mg total) by mouth daily at 6 PM. Patient taking differently: Take 80 mg by mouth daily. Takes at 5 pm daily 08/21/16   Claudene Pacific, MD  benzonatate  (TESSALON ) 100 MG capsule Take 1 capsule (100 mg total) by mouth 3 (three) times daily as needed for cough. Patient not taking: Reported on 07/21/2022 02/18/22   Long, Fonda MATSU, MD  budesonide-formoterol  [facility]) 160-4.5 MCG/ACT inhaler Inhale 2 puffs into the lungs 2 (two) times daily.    [provider]  chlorthalidone  (HYGROTON ) 25 MG tablet Take 25 mg by mouth daily.  07/11/22   [provider]  clopidogrel  (PLAVIX ) 75 MG tablet Take 75 mg by mouth daily. 02/05/22   [provider]  diphenhydrAMINE  (BENADRYL ) 25 mg capsule Take 50 mg by mouth at bedtime as needed for allergies.    [provider]  DULoxetine  (CYMBALTA ) 60 MG capsule Take 60 mg by mouth daily. 06/17/22   [provider]  levothyroxine  (SYNTHROID ) 25 MCG tablet Take 25 mcg by mouth daily before breakfast. 06/20/22   [provider]  lidocaine  4 % Place 1 patch onto the skin daily.    [provider]  lisinopril  (ZESTRIL ) 20 MG tablet Take 20 mg by mouth every morning. 07/11/22   [provider]  metoprolol  succinate (TOPROL -XL) 25 MG 24 hr tablet Take 25 mg by mouth every morning. 07/11/22   [provider]  nitroGLYCERIN  (NITROSTAT ) 0.4 MG SL tablet Place 1 tablet (0.4 mg total) under the tongue every 5 (five) minutes x 3 doses as needed for chest pain. 08/21/16   Claudene Pacific, MD  oxyCODONE -acetaminophen  (PERCOCET/ROXICET) 5-325 MG tablet Take 1 tablet by mouth every 6 (six) hours as needed for moderate pain. Patient not taking: Reported on 08/13/2022 07/26/22   Bethanie Cough, PA-C  pantoprazole  (PROTONIX ) 20 MG tablet Take 20 mg by mouth daily. 07/11/22   [provider]    Allergies: Bee venom, Coconut (cocos nucifera), Coconut flavoring agent (non-screening), Hydralazine , [place], Mushroom, Mushroom extract complex (obsolete), Amitriptyline, Diclofenac , Ketorolac , Trazodone  and nefazodone, Morphine , Toradol  [ketorolac  tromethamine ], and Tramadol    Review of Systems  Respiratory:  Negative for shortness of breath.   Cardiovascular:  Positive for syncope. Negative for chest pain.    Updated Vital Signs BP 112/89 (BP Location: Left Arm)   Pulse 68   Temp 97.7 F (36.5 C) (Oral)   Resp 19   Ht 5' 4 (1.626 m)   Wt 98.4 kg   SpO2 98%   BMI 37.24 kg/m   Physical Exam Vitals and nursing note reviewed.   Constitutional:      General: She is not in acute distress.    Appearance: Normal appearance. She is well-developed.  HENT:     Head: Normocephalic and atraumatic.  Eyes:     Conjunctiva/sclera: Conjunctivae normal.  Cardiovascular:     Rate and Rhythm: Normal rate and regular rhythm.     Heart sounds: No murmur heard. Pulmonary:     Effort: Pulmonary effort is normal. No respiratory distress.     Breath sounds: Normal breath sounds. No stridor. No wheezing.  Abdominal:     Palpations: Abdomen is soft.     Tenderness: There is no abdominal tenderness. There is no guarding or rebound.  Musculoskeletal:        General: No tenderness or deformity. Normal range of motion.     Cervical back: Neck supple.  Skin:    General: Skin is warm and dry.  Neurological:     General: No focal deficit present.     Mental Status: She is alert.     GCS: GCS eye subscore is 4. GCS verbal subscore is 5. GCS motor subscore is 6.     Sensory: No sensory deficit.     Motor: No weakness.     (all labs ordered are listed, but only abnormal results are displayed) Labs Reviewed  COMPREHENSIVE METABOLIC PANEL WITH GFR  CBC  URINALYSIS, ROUTINE W REFLEX MICROSCOPIC  CBG MONITORING, ED    EKG: EKG Interpretation Date/Time:  Thursday September 29 2024 16:34:22 EST Ventricular Rate:  69 PR Interval:  151 QRS Duration:  85 QT Interval:  376 QTC Calculation: 403 R Axis:   51  Text Interpretation: Sinus rhythm Nonspecific T abnormalities, diffuse leads t wave inversions new from prior 5/23 Confirmed by Towana Sharper [phone]) on 09/29/2024 4:46:57 PM  Radiology: No results found.  {Document cardiac monitor, telemetry assessment procedure when appropriate:32947} Procedures   Medications Ordered in the ED - No data to display    {Click here for ABCD2, HEART and other calculators REFRESH Note before signing:1}                              Medical Decision Making Amount and/or Complexity of  Data Reviewed Labs: ordered.   This patient complains of ***; this involves an extensive number of treatment Options and is a complaint that carries with it a high risk of complications and morbidity. The differential includes ***  I ordered, reviewed and interpreted labs, which included *** I ordered medication *** and reviewed PMP when indicated. I ordered imaging studies which included *** and I independently    visualized and interpreted imaging which showed *** Additional history obtained from *** Previous records obtained and reviewed *** I consulted *** and discussed lab and imaging findings and discussed disposition.  Cardiac monitoring reviewed, *** Social determinants considered, *** Critical Interventions: ***  After the interventions stated above, I reevaluated the  patient and found *** Admission and further testing considered, ***   {Document critical care time when appropriate  Document review of labs and clinical decision tools ie CHADS2VASC2, etc  Document your independent review of radiology images and any outside records  Document your discussion with family members, caretakers and with consultants  Document social determinants of health affecting pt's care  Document your decision making why or why not admission, treatments were needed:32947:::1}   Final diagnoses:  None    ED Discharge Orders     None

## 2024-09-29 NOTE — Telephone Encounter (Signed)
 [facility] Carla [name], [place], patient admitted on 09/22/24.  She is not cognitive intact. We are looking for information to help clarify cognitive status to help with discharging planning. Requesting Medical Records most recent visit notes, Can fax to [phone]

## 2024-09-29 NOTE — ED Triage Notes (Signed)
 Pt from [facility] c/o syncopal episode while in the shower. Pt was in a shower chair accompanied by staff. Denies hitting head Per ems CBG was 130.

## 2024-09-29 NOTE — ED Notes (Signed)
 Per [facility] social worker (Wednesday) patient does not want to speak or have family visit while here in the emergency dept. Primary nurse made aware

## 2024-09-29 NOTE — ED Notes (Signed)
 Pt verbalized she does not want her daughter on her chart. Pt requested if her daughter Magdalena showed up to call the police. Chart has been updated per pt request. Pt requested only Delon Nam to be her emergency contact but she didn't have her number here with her.

## 2024-11-09 ENCOUNTER — Inpatient Hospital Stay (HOSPITAL_COMMUNITY)
Admission: EM | Admit: 2024-11-09 | Discharge: 2024-11-15 | DRG: 193 | Disposition: A | Discharge status: Skilled Nursing Facility | Source: Skilled Nursing Facility | Attending: Family Medicine | Admitting: Family Medicine

## 2024-11-09 ENCOUNTER — Encounter (HOSPITAL_COMMUNITY): Payer: Self-pay

## 2024-11-09 ENCOUNTER — Other Ambulatory Visit: Payer: Self-pay

## 2024-11-09 ENCOUNTER — Emergency Department (HOSPITAL_COMMUNITY)

## 2024-11-09 DIAGNOSIS — I110 Hypertensive heart disease with heart failure: Secondary | ICD-10-CM | POA: Diagnosis present

## 2024-11-09 DIAGNOSIS — Z981 Arthrodesis status: Secondary | ICD-10-CM

## 2024-11-09 DIAGNOSIS — Z8701 Personal history of pneumonia (recurrent): Secondary | ICD-10-CM

## 2024-11-09 DIAGNOSIS — Z87892 Personal history of anaphylaxis: Secondary | ICD-10-CM

## 2024-11-09 DIAGNOSIS — J449 Chronic obstructive pulmonary disease, unspecified: Secondary | ICD-10-CM | POA: Diagnosis present

## 2024-11-09 DIAGNOSIS — Z91018 Allergy to other foods: Secondary | ICD-10-CM

## 2024-11-09 DIAGNOSIS — Z7902 Long term (current) use of antithrombotics/antiplatelets: Secondary | ICD-10-CM

## 2024-11-09 DIAGNOSIS — E039 Hypothyroidism, unspecified: Secondary | ICD-10-CM | POA: Diagnosis present

## 2024-11-09 DIAGNOSIS — Z8249 Family history of ischemic heart disease and other diseases of the circulatory system: Secondary | ICD-10-CM

## 2024-11-09 DIAGNOSIS — F32A Depression, unspecified: Secondary | ICD-10-CM | POA: Diagnosis present

## 2024-11-09 DIAGNOSIS — Z833 Family history of diabetes mellitus: Secondary | ICD-10-CM

## 2024-11-09 DIAGNOSIS — I5033 Acute on chronic diastolic (congestive) heart failure: Secondary | ICD-10-CM | POA: Diagnosis present

## 2024-11-09 DIAGNOSIS — E66812 Obesity, class 2: Secondary | ICD-10-CM | POA: Diagnosis present

## 2024-11-09 DIAGNOSIS — J441 Chronic obstructive pulmonary disease with (acute) exacerbation: Secondary | ICD-10-CM | POA: Diagnosis present

## 2024-11-09 DIAGNOSIS — Z885 Allergy status to narcotic agent status: Secondary | ICD-10-CM

## 2024-11-09 DIAGNOSIS — I252 Old myocardial infarction: Secondary | ICD-10-CM

## 2024-11-09 DIAGNOSIS — I2510 Atherosclerotic heart disease of native coronary artery without angina pectoris: Secondary | ICD-10-CM | POA: Diagnosis present

## 2024-11-09 DIAGNOSIS — D649 Anemia, unspecified: Secondary | ICD-10-CM | POA: Diagnosis present

## 2024-11-09 DIAGNOSIS — J9601 Acute respiratory failure with hypoxia: Secondary | ICD-10-CM | POA: Diagnosis present

## 2024-11-09 DIAGNOSIS — Z955 Presence of coronary angioplasty implant and graft: Secondary | ICD-10-CM

## 2024-11-09 DIAGNOSIS — R54 Age-related physical debility: Secondary | ICD-10-CM | POA: Diagnosis present

## 2024-11-09 DIAGNOSIS — G40909 Epilepsy, unspecified, not intractable, without status epilepticus: Secondary | ICD-10-CM | POA: Diagnosis present

## 2024-11-09 DIAGNOSIS — Z8673 Personal history of transient ischemic attack (TIA), and cerebral infarction without residual deficits: Secondary | ICD-10-CM

## 2024-11-09 DIAGNOSIS — E785 Hyperlipidemia, unspecified: Secondary | ICD-10-CM | POA: Diagnosis present

## 2024-11-09 DIAGNOSIS — Z888 Allergy status to other drugs, medicaments and biological substances status: Secondary | ICD-10-CM

## 2024-11-09 DIAGNOSIS — G629 Polyneuropathy, unspecified: Secondary | ICD-10-CM | POA: Diagnosis present

## 2024-11-09 DIAGNOSIS — F17210 Nicotine dependence, cigarettes, uncomplicated: Secondary | ICD-10-CM | POA: Diagnosis present

## 2024-11-09 DIAGNOSIS — M48062 Spinal stenosis, lumbar region with neurogenic claudication: Secondary | ICD-10-CM | POA: Diagnosis present

## 2024-11-09 DIAGNOSIS — Z8419 Family history of other disorders of kidney and ureter: Secondary | ICD-10-CM

## 2024-11-09 DIAGNOSIS — K219 Gastro-esophageal reflux disease without esophagitis: Secondary | ICD-10-CM | POA: Diagnosis present

## 2024-11-09 DIAGNOSIS — G8929 Other chronic pain: Secondary | ICD-10-CM | POA: Diagnosis present

## 2024-11-09 DIAGNOSIS — Z7982 Long term (current) use of aspirin: Secondary | ICD-10-CM

## 2024-11-09 DIAGNOSIS — Z91030 Bee allergy status: Secondary | ICD-10-CM

## 2024-11-09 DIAGNOSIS — J101 Influenza due to other identified influenza virus with other respiratory manifestations: Principal | ICD-10-CM | POA: Diagnosis present

## 2024-11-09 DIAGNOSIS — I10 Essential (primary) hypertension: Secondary | ICD-10-CM | POA: Diagnosis present

## 2024-11-09 DIAGNOSIS — Z79899 Other long term (current) drug therapy: Secondary | ICD-10-CM

## 2024-11-09 DIAGNOSIS — F0631 Mood disorder due to known physiological condition with depressive features: Secondary | ICD-10-CM | POA: Diagnosis present

## 2024-11-09 DIAGNOSIS — Z6832 Body mass index (BMI) 32.0-32.9, adult: Secondary | ICD-10-CM

## 2024-11-09 DIAGNOSIS — J102 Influenza due to other identified influenza virus with gastrointestinal manifestations: Secondary | ICD-10-CM | POA: Diagnosis present

## 2024-11-09 DIAGNOSIS — Z79890 Hormone replacement therapy (postmenopausal): Secondary | ICD-10-CM

## 2024-11-09 LAB — CBC
HCT: 31.7 % — ABNORMAL LOW (ref 36.0–46.0)
Hemoglobin: 10 g/dL — ABNORMAL LOW (ref 12.0–15.0)
MCH: 29.3 pg (ref 26.0–34.0)
MCHC: 31.5 g/dL (ref 30.0–36.0)
MCV: 93 fL (ref 80.0–100.0)
Platelets: 387 10*3/uL (ref 150–400)
RBC: 3.41 MIL/uL — ABNORMAL LOW (ref 3.87–5.11)
RDW: 15.2 % (ref 11.5–15.5)
WBC: 11.8 10*3/uL — ABNORMAL HIGH (ref 4.0–10.5)
nRBC: 0 % (ref 0.0–0.2)

## 2024-11-09 LAB — PROTIME-INR
INR: 1.2 (ref 0.8–1.2)
Prothrombin Time: 15.9 s — ABNORMAL HIGH (ref 11.4–15.2)

## 2024-11-09 LAB — RESP PANEL BY RT-PCR (FLU A&B, COVID) ARPGX2
Influenza A by PCR: POSITIVE — AB
Influenza B by PCR: NEGATIVE
SARS Coronavirus 2 by RT PCR: NEGATIVE

## 2024-11-09 LAB — BASIC METABOLIC PANEL WITH GFR
Anion gap: 16 — ABNORMAL HIGH (ref 5–15)
BUN: 12 mg/dL (ref 8–23)
CO2: 21 mmol/L — ABNORMAL LOW (ref 22–32)
Calcium: 8.7 mg/dL — ABNORMAL LOW (ref 8.9–10.3)
Chloride: 102 mmol/L (ref 98–111)
Creatinine, Ser: 1.03 mg/dL — ABNORMAL HIGH (ref 0.44–1.00)
GFR, Estimated: 58 mL/min — ABNORMAL LOW
Glucose, Bld: 135 mg/dL — ABNORMAL HIGH (ref 70–99)
Potassium: 4.3 mmol/L (ref 3.5–5.1)
Sodium: 139 mmol/L (ref 135–145)

## 2024-11-09 LAB — PRO BRAIN NATRIURETIC PEPTIDE: Pro Brain Natriuretic Peptide: 24815 pg/mL — ABNORMAL HIGH

## 2024-11-09 MED ORDER — FLUTICASONE FUROATE-VILANTEROL 100-25 MCG/ACT IN AEPB
1.0000 | INHALATION_SPRAY | Freq: Every day | RESPIRATORY_TRACT | Status: DC
  Administered 2024-11-10 – 2024-11-15 (×6): 1 via RESPIRATORY_TRACT
  Filled 2024-11-09: qty 28

## 2024-11-09 MED ORDER — METOPROLOL SUCCINATE ER 50 MG PO TB24
25.0000 mg | ORAL_TABLET | ORAL | Status: DC
  Administered 2024-11-10 – 2024-11-15 (×6): 25 mg via ORAL
  Filled 2024-11-09 (×6): qty 1

## 2024-11-09 MED ORDER — POLYETHYLENE GLYCOL 3350 17 G PO PACK
17.0000 g | PACK | Freq: Every day | ORAL | Status: DC | PRN
  Administered 2024-11-15: 17 g via ORAL
  Filled 2024-11-09: qty 1

## 2024-11-09 MED ORDER — NICOTINE 14 MG/24HR TD PT24
14.0000 mg | MEDICATED_PATCH | Freq: Every day | TRANSDERMAL | Status: DC
  Filled 2024-11-09 (×4): qty 1

## 2024-11-09 MED ORDER — ALBUTEROL SULFATE (2.5 MG/3ML) 0.083% IN NEBU
10.0000 mg/h | INHALATION_SOLUTION | Freq: Once | RESPIRATORY_TRACT | Status: AC
  Administered 2024-11-09: 10 mg/h via RESPIRATORY_TRACT
  Filled 2024-11-09: qty 12

## 2024-11-09 MED ORDER — DULOXETINE HCL 60 MG PO CPEP
60.0000 mg | ORAL_CAPSULE | Freq: Every day | ORAL | Status: DC
  Administered 2024-11-09 – 2024-11-15 (×7): 60 mg via ORAL
  Filled 2024-11-09 (×7): qty 1

## 2024-11-09 MED ORDER — ATORVASTATIN CALCIUM 40 MG PO TABS
80.0000 mg | ORAL_TABLET | Freq: Every day | ORAL | Status: DC
  Administered 2024-11-11 – 2024-11-14 (×4): 80 mg via ORAL
  Filled 2024-11-09 (×5): qty 2

## 2024-11-09 MED ORDER — LACTATED RINGERS IV SOLN: INTRAVENOUS | Status: DC

## 2024-11-09 MED ORDER — ENOXAPARIN SODIUM 40 MG/0.4ML IJ SOSY
40.0000 mg | PREFILLED_SYRINGE | INTRAMUSCULAR | Status: DC
  Administered 2024-11-09 – 2024-11-14 (×6): 40 mg via SUBCUTANEOUS
  Filled 2024-11-09 (×6): qty 0.4

## 2024-11-09 MED ORDER — METHYLPREDNISOLONE SODIUM SUCC 125 MG IJ SOLR: 125.0000 mg | Freq: Once | INTRAMUSCULAR | Status: DC

## 2024-11-09 MED ORDER — OSELTAMIVIR PHOSPHATE 75 MG PO CAPS
75.0000 mg | ORAL_CAPSULE | Freq: Once | ORAL | Status: AC
  Administered 2024-11-09: 75 mg via ORAL
  Filled 2024-11-09: qty 1

## 2024-11-09 MED ORDER — LEVETIRACETAM 500 MG PO TABS
500.0000 mg | ORAL_TABLET | Freq: Two times a day (BID) | ORAL | Status: DC
  Administered 2024-11-09 – 2024-11-15 (×12): 500 mg via ORAL
  Filled 2024-11-09 (×12): qty 1

## 2024-11-09 MED ORDER — NITROGLYCERIN 0.4 MG SL SUBL: 0.4000 mg | SUBLINGUAL_TABLET | SUBLINGUAL | Status: DC | PRN

## 2024-11-09 MED ORDER — ACETAMINOPHEN 650 MG RE SUPP: 650.0000 mg | Freq: Four times a day (QID) | RECTAL | Status: DC | PRN

## 2024-11-09 MED ORDER — LEVOTHYROXINE SODIUM 25 MCG PO TABS
25.0000 ug | ORAL_TABLET | Freq: Every day | ORAL | Status: DC
  Administered 2024-11-10 – 2024-11-15 (×6): 25 ug via ORAL
  Filled 2024-11-09 (×6): qty 1

## 2024-11-09 MED ORDER — FUROSEMIDE 10 MG/ML IJ SOLN
40.0000 mg | Freq: Every day | INTRAMUSCULAR | Status: DC
  Administered 2024-11-09 – 2024-11-10 (×2): 40 mg via INTRAVENOUS
  Filled 2024-11-09 (×2): qty 4

## 2024-11-09 MED ORDER — TIZANIDINE HCL 2 MG PO TABS
2.0000 mg | ORAL_TABLET | Freq: Once | ORAL | Status: AC
  Administered 2024-11-09: 2 mg via ORAL
  Filled 2024-11-09: qty 1

## 2024-11-09 MED ORDER — ONDANSETRON HCL 4 MG/2ML IJ SOLN
4.0000 mg | Freq: Once | INTRAMUSCULAR | Status: AC
  Administered 2024-11-09: 4 mg via INTRAVENOUS
  Filled 2024-11-09: qty 2

## 2024-11-09 MED ORDER — ACETAMINOPHEN 325 MG PO TABS
650.0000 mg | ORAL_TABLET | Freq: Four times a day (QID) | ORAL | Status: DC | PRN
  Administered 2024-11-10: 650 mg via ORAL
  Filled 2024-11-09: qty 2

## 2024-11-09 MED ORDER — OSELTAMIVIR PHOSPHATE 30 MG PO CAPS
30.0000 mg | ORAL_CAPSULE | Freq: Two times a day (BID) | ORAL | Status: AC
  Administered 2024-11-09 – 2024-11-14 (×10): 30 mg via ORAL
  Filled 2024-11-09 (×10): qty 1

## 2024-11-09 MED ORDER — ASPIRIN 81 MG PO CHEW
81.0000 mg | CHEWABLE_TABLET | ORAL | Status: DC
  Administered 2024-11-10 – 2024-11-15 (×6): 81 mg via ORAL
  Filled 2024-11-09 (×6): qty 1

## 2024-11-09 MED ORDER — METHYLPREDNISOLONE SODIUM SUCC 125 MG IJ SOLR
125.0000 mg | Freq: Every day | INTRAMUSCULAR | Status: DC
  Administered 2024-11-09 – 2024-11-10 (×2): 125 mg via INTRAVENOUS
  Filled 2024-11-09 (×2): qty 2

## 2024-11-09 MED ORDER — PANTOPRAZOLE SODIUM 40 MG PO TBEC
40.0000 mg | DELAYED_RELEASE_TABLET | Freq: Every day | ORAL | Status: DC
  Administered 2024-11-09 – 2024-11-15 (×7): 40 mg via ORAL
  Filled 2024-11-09 (×7): qty 1

## 2024-11-09 MED ORDER — ALBUTEROL SULFATE (2.5 MG/3ML) 0.083% IN NEBU
2.5000 mg | INHALATION_SOLUTION | RESPIRATORY_TRACT | Status: DC | PRN
  Administered 2024-11-10: 2.5 mg via RESPIRATORY_TRACT
  Filled 2024-11-09: qty 3

## 2024-11-09 MED ORDER — IPRATROPIUM BROMIDE 0.02 % IN SOLN
0.5000 mg | Freq: Once | RESPIRATORY_TRACT | Status: AC
  Administered 2024-11-09: 0.5 mg via RESPIRATORY_TRACT
  Filled 2024-11-09: qty 2.5

## 2024-11-09 MED ORDER — LISINOPRIL 10 MG PO TABS
20.0000 mg | ORAL_TABLET | ORAL | Status: DC
  Administered 2024-11-10: 20 mg via ORAL
  Filled 2024-11-09: qty 2

## 2024-11-09 NOTE — ED Provider Notes (Signed)
 " [place] [facility] DE[facility]MENT AT [facility] Provider Note   CSN: [id number] Arrival date & time: 11/09/24  1140     Patient presents with: Respiratory Distress   Carla [name] is a 74 y.o. female.   HPI     74 year old female with history of COPD, hypertension, strokes and CAD comes in with chief complaint of worsening shortness of breath.  Patient reports that she started getting sick yesterday, is having difficulty breathing along with nausea, vomiting.  She has had some sick contacts at the nursing home.  Prior to Admission medications  Medication Sig Start Date End Date Taking? Authorizing Provider  albuterol  (PROVENTIL  HFA;VENTOLIN  HFA) 108 (90 BASE) MCG/ACT inhaler Inhale 2 puffs into the lungs every 6 (six) hours as needed for wheezing. 06/21/13   Gosrani, Nimish C, MD  aspirin  81 MG chewable tablet Chew 81 mg by mouth every morning.    [provider]  atorvastatin  (LIPITOR ) 80 MG tablet Take 1 tablet (80 mg total) by mouth daily at 6 PM. Patient taking differently: Take 80 mg by mouth daily. Takes at 5 pm daily 08/21/16   Claudene Pacific, MD  benzonatate  (TESSALON ) 100 MG capsule Take 1 capsule (100 mg total) by mouth 3 (three) times daily as needed for cough. Patient not taking: Reported on 07/21/2022 02/18/22   Long, Fonda MATSU, MD  budesonide-formoterol  [facility]) 160-4.5 MCG/ACT inhaler Inhale 2 puffs into the lungs 2 (two) times daily.    [provider]  chlorthalidone  (HYGROTON ) 25 MG tablet Take 25 mg by mouth daily. 07/11/22   [provider]  clopidogrel  (PLAVIX ) 75 MG tablet Take 75 mg by mouth daily. 02/05/22   [provider]  diphenhydrAMINE  (BENADRYL ) 25 mg capsule Take 50 mg by mouth at bedtime as needed for allergies.    [provider]  DULoxetine  (CYMBALTA ) 60 MG capsule Take 60 mg by mouth daily. 06/17/22   [provider]  levothyroxine  (SYNTHROID ) 25 MCG tablet Take 25 mcg by mouth daily before  breakfast. 06/20/22   [provider]  lidocaine  4 % Place 1 patch onto the skin daily.    [provider]  lisinopril  (ZESTRIL ) 20 MG tablet Take 20 mg by mouth every morning. 07/11/22   [provider]  metoprolol  succinate (TOPROL -XL) 25 MG 24 hr tablet Take 25 mg by mouth every morning. 07/11/22   [provider]  nitroGLYCERIN  (NITROSTAT ) 0.4 MG SL tablet Place 1 tablet (0.4 mg total) under the tongue every 5 (five) minutes x 3 doses as needed for chest pain. 08/21/16   Claudene Pacific, MD  oxyCODONE -acetaminophen  (PERCOCET/ROXICET) 5-325 MG tablet Take 1 tablet by mouth every 6 (six) hours as needed for moderate pain. Patient not taking: Reported on 08/13/2022 07/26/22   Bethanie Cough, PA-C  pantoprazole  (PROTONIX ) 20 MG tablet Take 20 mg by mouth daily. 07/11/22   [provider]    Allergies: Bee venom, Coconut (cocos nucifera), Coconut flavoring agent (non-screening), Hydralazine , [place], Mushroom, Mushroom extract complex (obsolete), Amitriptyline, Diclofenac , Ketorolac , Trazodone and nefazodone, Morphine , Toradol  [ketorolac  tromethamine ], and Tramadol    Review of Systems  All other systems reviewed and are negative.   Updated Vital Signs BP (!) 169/66   Pulse 62   Temp (!) 100.5 F (38.1 C) (Rectal)   Resp 20   SpO2 99%   Physical Exam Vitals and nursing note reviewed.  Constitutional:      Appearance: She is well-developed.  HENT:     Head: Atraumatic.  Cardiovascular:     Rate and Rhythm: Normal rate.  Pulmonary:     Effort: Pulmonary effort is normal.     Breath sounds: Wheezing present.  Musculoskeletal:     Cervical back: Normal range of motion and neck supple.  Skin:    General: Skin is warm and dry.  Neurological:     Mental Status: She is alert and oriented to person, place, and time.     (all labs ordered are listed, but only abnormal results are displayed) Labs Reviewed  RESP PANEL BY RT-PCR (FLU A&B, COVID)  ARPGX2 - Abnormal; Notable for the following components:      Result Value   Influenza A by PCR POSITIVE (*)    All other components within normal limits  BASIC METABOLIC PANEL WITH GFR - Abnormal; Notable for the following components:   CO2 21 (*)    Glucose, Bld 135 (*)    Creatinine, Ser 1.03 (*)    Calcium  8.7 (*)    GFR, Estimated 58 (*)    Anion gap 16 (*)    All other components within normal limits  CBC - Abnormal; Notable for the following components:   WBC 11.8 (*)    RBC 3.41 (*)    Hemoglobin 10.0 (*)    HCT 31.7 (*)    All other components within normal limits  PROTIME-INR - Abnormal; Notable for the following components:   Prothrombin Time 15.9 (*)    All other components within normal limits  PRO BRAIN NATRIURETIC PEPTIDE    EKG: EKG Interpretation Date/Time:  Wednesday November 09 2024 11:51:32 EST Ventricular Rate:  68 PR Interval:  142 QRS Duration:  94 QT Interval:  448 QTC Calculation: 477 R Axis:   77  Text Interpretation: Sinus rhythm Low voltage, precordial leads Borderline repolarization abnormality No acute changes No significant change since last tracing Confirmed by Charlyn Sora ([id number]) on 11/09/2024 2:16:49 PM  Radiology: [facility] Chest Port 1 View Result Date: 11/09/2024 CLINICAL DATA:  Shortness of breath. EXAM: PORTABLE CHEST 1 VIEW COMPARISON:  Chest radiograph dated 09/29/2024. FINDINGS: There is diffuse interstitial reticulonodular densities, new since the prior radiograph may represent edema or atypical pneumonia. Clinical correlation is recommended. Small bilateral pleural effusions and right lung base subsegmental atelectasis. No pneumothorax. Mild cardiomegaly. No acute osseous pathology. IMPRESSION: Mild cardiomegaly with interval development of mild CHF and small bilateral pleural effusions. Pneumonia is not excluded Electronically Signed   By: Vanetta Chou M.D.   On: 11/09/2024 13:08     Procedures   Medications Ordered in the ED   albuterol  (PROVENTIL ) (2.5 MG/3ML) 0.083% nebulizer solution (has no administration in time range)  ipratropium (ATROVENT ) nebulizer solution 0.5 mg (has no administration in time range)  oseltamivir  (TAMIFLU ) capsule 75 mg (has no administration in time range)  ondansetron  (ZOFRAN ) injection 4 mg (has no administration in time range)  aspirin  chewable tablet 81 mg (has no administration in time range)  lisinopril  (ZESTRIL ) tablet 20 mg (has no administration in time range)  metoprolol  succinate (TOPROL -XL) 24 hr tablet 25 mg (has no administration in time range)  nitroGLYCERIN  (NITROSTAT ) SL tablet 0.4 mg (has no administration in time range)  DULoxetine  (CYMBALTA ) DR capsule 60 mg (has no administration in time range)  levothyroxine  (SYNTHROID ) tablet 25 mcg (has no administration in time range)  pantoprazole  (PROTONIX ) EC tablet 40 mg (has no administration in time range)  fluticasone  furoate-vilanterol (BREO ELLIPTA ) 100-25 MCG/ACT 1 puff (has no administration in time range)  enoxaparin  (  LOVENOX ) injection 40 mg (has no administration in time range)  acetaminophen  (TYLENOL ) tablet 650 mg (has no administration in time range)    Or  acetaminophen  (TYLENOL ) suppository 650 mg (has no administration in time range)  polyethylene glycol (MIRALAX  / GLYCOLAX ) packet 17 g (has no administration in time range)  albuterol  (PROVENTIL ) (2.5 MG/3ML) 0.083% nebulizer solution 2.5 mg (has no administration in time range)  atorvastatin  (LIPITOR ) tablet 80 mg (has no administration in time range)  levETIRAcetam  (KEPPRA ) tablet 500 mg (has no administration in time range)  oseltamivir  (TAMIFLU ) capsule 30 mg (has no administration in time range)  methylPREDNISolone  sodium succinate (SOLU-MEDROL ) 125 mg/2 mL injection 125 mg (has no administration in time range)    Clinical Course as of 11/09/24 1527  Wed Nov 09, 2024  1509 SpO2(!): 87 % Patient's O2 sats dropped into the mid 80s without oxygen .   Normally she is not on oxygen .  With 2 L, she is having O2 sats over 95%.  Will request admission. [AN]  1525 DG Chest Port 1 View X-ray independently interpreted.  There is likely pleural effusion to the right side.  There is a wedge-shaped opacity in the right lower lung field, that appears very well-defined, not consistent with pneumonia.  Clinical suspicion is low for bacterial pneumonia at this time.  Patient's flu test is positive, likely she is just having fluid related changes. [AN]    Clinical Course User Index [AN] Charlyn Sora, MD                                 Medical Decision Making Amount and/or Complexity of Data Reviewed Labs: ordered.  Risk Prescription drug management. Decision regarding hospitalization.   74 year old patient with history of hypertension, COPD, stroke and CAD comes in with chief complaint of worsening shortness of breath.  Her symptoms started yesterday.  She currently resides at nursing home.  Patient has wheezing.  Differential diagnosis includes influenza, RSV, COVID, COPD exacerbation, pulmonary edema, bacterial pneumonia, pleural effusion.  Plan is to get basic labs, x-ray of the chest. We will initiate breathing treatment.  Patient has low-grade fever.  Patient is O2 sats drops to upper 80s on room air.  Normally not on oxygen .  Final diagnoses:  Influenza A  Acute respiratory failure with hypoxia [facility])    ED Discharge Orders     None          Charlyn Sora, MD 11/09/24 1527  "

## 2024-11-09 NOTE — H&P (Signed)
 "                                                                                                          [facility] H&P   Patient Demographics:    Carla [name], is a 74 y.o. female  MRN: [id number]   DOB - [birth year]  Admit Date - 11/09/2024  Outpatient Primary MD for the patient is Hyler, Vernell DEL, NP    Chief Complaint  Patient presents with   Respiratory Distress      HPI:    Carla [name]  is a 75 y.o. female, with a history of hypertension, COPD, osteoarthritis of the knee, GERD, multiple cerebrovascular accidents, coronary artery disease, hypothyroidism, and tobacco use, recent admission at [facility] in December with diagnosis of seizure, started on Keppra , apparently [facility]. Patient was sent to ED today secondary to complaints of shortness of breath and respiratory distress, she was noted to have difficulty breathing, feeling sick over last 24 hours, as well having some nausea and vomiting, there is report of some sick contacts in the nursing home. -Her workup in ED was noted her being febrile 100.5, saturating 85% on room air, x-ray with evidence of volume overload and pleural effusion, respiratory panel significant for positive influenza A, she was started on steroids, nebulizer treatment, requiring 2 L oxygen  and [facility] hospitalist consulted to admit.    Review of systems:      A full 10 point Review of Systems was done, except as stated above, all other Review of Systems were negative.   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   Dizziness    Frequent falls    GERD (gastroesophageal reflux disease)     Hypertension    Memory difficulty 2015   Memory loss    Neurology - Dr Eduard Oman   Migraine    4-5/year (08/19/2016); @ least once/month now (05/25/2017)   Myocardial infarction (HCC) 08/18/2016   Neuropathy    Pain management    Pneumonia several times   Pre-diabetes    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   likely related to situational and psychosocial stress, depression, pain   Shortness of breath dyspnea    with exertion   Spinal stenosis, lumbar region, 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   Stroke (HCC) 01/2022   ischemic frontal lobe   Unspecified convulsions (HCC)    Uses roller walker    right sided weakness   Weakness of both legs       Past Surgical History:  Procedure Laterality Date   APPENDECTOMY  1971   BACK SURGERY     CARDIAC  CATHETERIZATION N/A 08/19/2016   Procedure: Left Heart Cath and Coronary Angiography;  Surgeon: Salena Negri, MD;  Location: [facility] INVASIVE CV LAB;  Service: Cardiovascular;  Laterality: N/A;   CARDIAC CATHETERIZATION N/A 08/19/2016   Procedure: Coronary Stent Intervention;  Surgeon: Gordy Bergamo, MD;  Location[facility] INVASIVE CV LAB;  Service: Cardiovascular;  Laterality: N/A;   CHOLECYSTECTOMY N/A 03/12/2016   Procedure: LAPAROSCOPIC CHOLECYSTECTOMY;  Surgeon: Selinda Artist Moats, MD;  Location: [facility] ORS;  Service: General;  Laterality: N/A;   ENDARTERECTOMY Left 07/24/2022   Procedure: ENDARTERECTOMY CAROTID WITH PATCH ANGIOPLASTY;  Surgeon: Lanis Fonda BRAVO, MD;  Location: [facility] OR;  Service: Vascular;  Laterality: Left;   KNEE ARTHROSCOPY Right 2007   LUMBAR FUSION  05/2016   L4-L5 decompression and fusion    PATCH ANGIOPLASTY Left 07/24/2022   Procedure: PATCH ANGIOPLASTY WITH XENOSURE 1 X 6 CM BOVINE PATCH;  Surgeon: Lanis Fonda BRAVO, MD;  Location: [facility] OR;  Service: Vascular;  Laterality: Left;   TONSILLECTOMY  1971      Social History:     Social History   Tobacco Use   Smoking status: Every Day    Current packs/day: 0.50     Average packs/day: 0.5 packs/day for 54.3 years (27.1 ttl pk-yrs)    Types: Cigarettes    Start date: 08/05/1970   Smokeless tobacco: Never  Substance Use Topics   Alcohol use: Not Currently    Comment: occasional        Family History :     Family History  Problem Relation Age of Onset   Heart failure Father    Diabetes Father    Kidney failure Father       Home Medications:   Prior to Admission medications  Medication Sig Start Date End Date Taking? Authorizing Provider  albuterol  (PROVENTIL  HFA;VENTOLIN  HFA) 108 (90 BASE) MCG/ACT inhaler Inhale 2 puffs into the lungs every 6 (six) hours as needed for wheezing. 06/21/13   Gosrani, Nimish C, MD  aspirin  81 MG chewable tablet Chew 81 mg by mouth every morning.    [provider]  atorvastatin  (LIPITOR ) 80 MG tablet Take 1 tablet (80 mg total) by mouth daily at 6 PM. Patient taking differently: Take 80 mg by mouth daily. Takes at 5 pm daily 08/21/16   Negri Salena, MD  benzonatate  (TESSALON ) 100 MG capsule Take 1 capsule (100 mg total) by mouth 3 (three) times daily as needed for cough. Patient not taking: Reported on 07/21/2022 02/18/22   Long, Fonda MATSU, MD  budesonide-formoterol  [facility] 160-4.5 MCG/ACT inhaler Inhale 2 puffs into the lungs 2 (two) times daily.    [provider]  chlorthalidone  (HYGROTON ) 25 MG tablet Take 25 mg by mouth daily. 07/11/22   [provider]  clopidogrel  (PLAVIX ) 75 MG tablet Take 75 mg by mouth daily. 02/05/22   [provider]  diphenhydrAMINE  (BENADRYL ) 25 mg capsule Take 50 mg by mouth at bedtime as needed for allergies.    [provider]  DULoxetine  (CYMBALTA ) 60 MG capsule Take 60 mg by mouth daily. 06/17/22   [provider]  levothyroxine  (SYNTHROID ) 25 MCG tablet Take 25 mcg by mouth daily before breakfast. 06/20/22   [provider]  lidocaine  4 % Place 1 patch onto the skin daily.    [provider]  lisinopril  (ZESTRIL )  20 MG tablet Take 20 mg by mouth every morning. 07/11/22   [provider]  metoprolol  succinate (TOPROL -XL) 25 MG 24 hr tablet Take 25 mg by  mouth every morning. 07/11/22   [provider]  nitroGLYCERIN  (NITROSTAT ) 0.4 MG SL tablet Place 1 tablet (0.4 mg total) under the tongue every 5 (five) minutes x 3 doses as needed for chest pain. 08/21/16   Claudene Pacific, MD  oxyCODONE -acetaminophen  (PERCOCET/ROXICET) 5-325 MG tablet Take 1 tablet by mouth every 6 (six) hours as needed for moderate pain. Patient not taking: Reported on 08/13/2022 07/26/22   Bethanie Cough, PA-C  pantoprazole  (PROTONIX ) 20 MG tablet Take 20 mg by mouth daily. 07/11/22   [provider]     Allergies:    Allergies[1]   Physical Exam:   Vitals  Blood pressure (!) 169/66, pulse 62, temperature (!) 100.5 F (38.1 C), temperature source Rectal, resp. rate 20, SpO2 99%.   1. General Frail, chronic ill-appearing, in mild discomfort and tachypneic  2.,  Alert, oriented x 3 and appropriate(though she was noted by staff to be intermittently confused)  3. No F.N deficits, ALL C.Nerves Intact, no gross deficits, Plantars down going.  4. Ears and Eyes appear Normal, Conjunctivae clear, PERRLA. Moist Oral Mucosa.  5. Supple Neck,  No Carotid Bruits.  6. Symmetrical Chest wall movement, tachypneic, scattered wheezing and diminished air entry at the bases  7. RRR, No Gallops, Rubs or Murmurs, No Parasternal Heave.  1 edema  8. Positive Bowel Sounds, Abdomen Soft, No tenderness, No organomegaly appriciated,No rebound -guarding or rigidity.  9.  No Cyanosis, Normal Skin Turgor, No Skin Rash or Bruise.  10. Good muscle tone,  joints appear normal , no effusions, Normal ROM.    Data Review:    CBC Recent Labs  Lab 11/09/24 1150  WBC 11.8*  HGB 10.0*  HCT 31.7*  PLT 387  MCV 93.0  MCH 29.3  MCHC 31.5  RDW 15.2    ------------------------------------------------------------------------------------------------------------------  Chemistries  Recent Labs  Lab 11/09/24 1150  NA 139  K 4.3  CL 102  CO2 21*  GLUCOSE 135*  BUN 12  CREATININE 1.03*  CALCIUM  8.7*   ------------------------------------------------------------------------------------------------------------------ CrCl cannot be calculated (Unknown ideal weight.). ------------------------------------------------------------------------------------------------------------------ No results for input(s): TSH, T4TOTAL, T3FREE, THYROIDAB in the last 72 hours.  Invalid input(s): FREET3  Coagulation profile Recent Labs  Lab 11/09/24 1150  INR 1.2   ------------------------------------------------------------------------------------------------------------------- No results for input(s): DDIMER in the last 72 hours. -------------------------------------------------------------------------------------------------------------------  Cardiac Enzymes No results for input(s): CKMB, TROPONINI, MYOGLOBIN in the last 168 hours.  Invalid input(s): CK ------------------------------------------------------------------------------------------------------------------    Component Value Date/Time   BNP 126.6 (H) 05/25/2017 1428     ---------------------------------------------------------------------------------------------------------------  Urinalysis    Component Value Date/Time   COLORURINE YELLOW 07/24/2022 0718   APPEARANCEUR HAZY (A) 07/24/2022 0718   LABSPEC 1.025 07/24/2022 0718   PHURINE 5.0 07/24/2022 0718   GLUCOSEU NEGATIVE 07/24/2022 0718   HGBUR NEGATIVE 07/24/2022 0718   BILIRUBINUR NEGATIVE 07/24/2022 0718   KETONESUR NEGATIVE 07/24/2022 0718   PROTEINUR NEGATIVE 07/24/2022 0718   UROBILINOGEN 0.2 06/28/2015 1940   NITRITE NEGATIVE 07/24/2022 0718   LEUKOCYTESUR LARGE (A) 07/24/2022 0718     ----------------------------------------------------------------------------------------------------------------   Imaging Results:    DG Chest Port 1 View Result Date: 11/09/2024 CLINICAL DATA:  Shortness of breath. EXAM: PORTABLE CHEST 1 VIEW COMPARISON:  Chest radiograph dated 09/29/2024. FINDINGS: There is diffuse interstitial reticulonodular densities, new since the prior radiograph may represent edema or atypical pneumonia. Clinical correlation is recommended. Small bilateral pleural effusions and right lung base subsegmental atelectasis. No pneumothorax. Mild cardiomegaly. No acute osseous pathology. IMPRESSION: Mild cardiomegaly with interval  development of mild CHF and small bilateral pleural effusions. Pneumonia is not excluded Electronically Signed   By: Vanetta Chou M.D.   On: 11/09/2024 13:08    EKG:   Vent. rate 68 BPM PR interval 142 ms QRS duration 94 ms QT/QTcB 448/477 ms P-R-T axes 62 77 78 Sinus rhythm Low voltage, precordial leads Borderline repolarization abnormalit    Assessment & Plan:    Principal Problem:   Acute respiratory failure with hypoxia (HCC) Active Problems:   COPD (chronic obstructive pulmonary disease) (HCC)   Essential hypertension   Spinal stenosis, lumbar region, with neurogenic claudication   Depression with pseudodementia   Obesity, morbid (HCC)   Acute respiratory failure with hypoxia Influenza A infection COPD exacerbation Acute CHF exacerbation with pr EF -Patient presents with evidence of dyspnea, hypoxic 85% on room air tachypneic with increased work of breathing - Currently improved oxygenation on 2 L oxygen , still breathing at 30 breaths/min - Influenza A infection, continue with Tamiflu  - Wheezing, dyspnea and hypoxia due to COPD/starvation, continue with IV Solu-Medrol , Symbicort and as needed albuterol  - She was encouraged use incentive spirometer. - Imaging with evidence of volume overload, as well she is having +1  extremity edema, concern for CHF, echo done last month at [facility] with a preserved EF. - Will keep strict ins and outs, daily weight and start on low-dose Lasix  40 mg IV daily.  History of seizure -Continue with Keppra   GERD - Continue with PPI, will increase Protonix  to 40 mg daily   History of CAD status post stent in 2017 History of multiple CVAs History of left carotid stenosis status post endarterectomy -Patient supposed to be on aspirin , Plavix  and statin, but for unclear reason they were stopped on recent discharge from [facility], I will go ahead and resume her back on aspirin , and statin, and if stable I will resume back on Plavix  in 24 to 48 hours. - She remains on beta-blockers and ACE inhibitor for CAD history, will continue   HLD - Will resume back on statin   Hypothyroidism -Continue with Synthroid   Hypertension -On metoprolol  and lisinopril , will continue both  Tobacco abuse -still smoking, she was counseled, will start on nicotine  patch  Morbid obesity with multiple comorbidities - She would benefit from weight loss, her morbid obesity will complicate her prognosis .  DVT Prophylaxis Lovenox   AM Labs Ordered, also please review Full Orders  Family Communication: Admission, patients condition and plan of care including tests being ordered have been discussed with the patient who indicate understanding and agree with the plan and Code Status.  Code Status full code  Likely DC to back to SNF  Consults called: None  Admission status: Inpatient  Time spent in minutes : 70 minutes   Brayton Lye M.D on 11/09/2024 at 3:27 PM   [facility] - Office  [phone]        [1]  Allergies Allergen Reactions   Bee Venom Anaphylaxis   Coconut (Cocos Nucifera) Anaphylaxis and Other (See Comments)    Per patient   Coconut Flavoring Agent (Non-Screening) Anaphylaxis    Per patient   Hydralazine  Shortness Of Breath    Other  reaction(s): Headache, Other (See Comments) Chest Pain   Maitake Anaphylaxis    Extreme sweating, vomiting   Mushroom Anaphylaxis    Extreme sweating, vomiting   Mushroom Extract Complex (Obsolete) Anaphylaxis    Extreme sweating, vomiting   Amitriptyline Other (See Comments)    Has nightmares when using,  not in right state of mind    Diclofenac  Dermatitis and Other (See Comments)    Not helpful   Ketorolac  Nausea And Vomiting and Nausea Only   Trazodone And Nefazodone Nausea And Vomiting and Nausea Only   Morphine     Toradol  [Ketorolac  Tromethamine ] Nausea And Vomiting   Tramadol Nausea And Vomiting   "

## 2024-11-09 NOTE — ED Triage Notes (Signed)
 Pt BIB [facility] for respiratory distress. Pt started doxy ID on 1/23 for cellulitis. Pts has generalized edema on face and legs. Hx of CHF. Pt endorses headache.

## 2024-11-09 NOTE — ED Notes (Addendum)
 The patient is experiencing visual hallucinations, asking if we can remove the onions from the bowl of potato salad that she sees sitting on her bed. No potato salad is there. Dr. Brayton informed via epic secure chat and he stated she is febrile with flu, just metabolic encephalopathy and she should get better once her clinical condition improves.

## 2024-11-10 LAB — BASIC METABOLIC PANEL WITH GFR
Anion gap: 11 (ref 5–15)
BUN: 16 mg/dL (ref 8–23)
CO2: 27 mmol/L (ref 22–32)
Calcium: 8.9 mg/dL (ref 8.9–10.3)
Chloride: 102 mmol/L (ref 98–111)
Creatinine, Ser: 1.01 mg/dL — ABNORMAL HIGH (ref 0.44–1.00)
GFR, Estimated: 59 mL/min — ABNORMAL LOW
Glucose, Bld: 140 mg/dL — ABNORMAL HIGH (ref 70–99)
Potassium: 4.3 mmol/L (ref 3.5–5.1)
Sodium: 141 mmol/L (ref 135–145)

## 2024-11-10 LAB — CBC
HCT: 30.2 % — ABNORMAL LOW (ref 36.0–46.0)
Hemoglobin: 9.7 g/dL — ABNORMAL LOW (ref 12.0–15.0)
MCH: 29.5 pg (ref 26.0–34.0)
MCHC: 32.1 g/dL (ref 30.0–36.0)
MCV: 91.8 fL (ref 80.0–100.0)
Platelets: 337 10*3/uL (ref 150–400)
RBC: 3.29 MIL/uL — ABNORMAL LOW (ref 3.87–5.11)
RDW: 15.3 % (ref 11.5–15.5)
WBC: 9.1 10*3/uL (ref 4.0–10.5)
nRBC: 0 % (ref 0.0–0.2)

## 2024-11-10 LAB — PRO BRAIN NATRIURETIC PEPTIDE: Pro Brain Natriuretic Peptide: 20886 pg/mL — ABNORMAL HIGH

## 2024-11-10 MED ORDER — IPRATROPIUM-ALBUTEROL 0.5-2.5 (3) MG/3ML IN SOLN
3.0000 mL | Freq: Three times a day (TID) | RESPIRATORY_TRACT | Status: DC
  Administered 2024-11-10 – 2024-11-13 (×8): 3 mL via RESPIRATORY_TRACT
  Filled 2024-11-10 (×8): qty 3

## 2024-11-10 MED ORDER — GUAIFENESIN 100 MG/5ML PO LIQD
5.0000 mL | ORAL | Status: DC | PRN
  Administered 2024-11-10 – 2024-11-12 (×2): 5 mL via ORAL
  Filled 2024-11-10 (×2): qty 5

## 2024-11-10 MED ORDER — TIZANIDINE HCL 2 MG PO TABS
2.0000 mg | ORAL_TABLET | Freq: Three times a day (TID) | ORAL | Status: DC
  Administered 2024-11-10 – 2024-11-15 (×15): 2 mg via ORAL
  Filled 2024-11-10 (×15): qty 1

## 2024-11-10 MED ORDER — DM-GUAIFENESIN ER 30-600 MG PO TB12
1.0000 | ORAL_TABLET | Freq: Two times a day (BID) | ORAL | Status: DC
  Administered 2024-11-10 – 2024-11-15 (×10): 1 via ORAL
  Filled 2024-11-10 (×10): qty 1

## 2024-11-10 MED ORDER — CLOPIDOGREL BISULFATE 75 MG PO TABS
75.0000 mg | ORAL_TABLET | Freq: Every day | ORAL | Status: DC
  Administered 2024-11-10 – 2024-11-15 (×6): 75 mg via ORAL
  Filled 2024-11-10 (×6): qty 1

## 2024-11-10 NOTE — TOC Initial Note (Signed)
 Transition of Care [facility]) - Initial/Assessment Note    Patient Details  Name: Carla [name] MRN: [id number] Date of Birth: Aug 26, 1951  Transition of Care [facility]) CM/SW Contact:    Hoy DELENA Bigness, LCSW Phone Number: 11/10/2024, 3:32 PM  Clinical Narrative:                 Pt admitted from [facility]. Pt only oriented x 2 however, CSW unable to reach pt's emergency contact/daughter, Magdalena Queen. Pt shares she has been at this facility for the past month and half and states she will not return at discharge. Pt shares she plans to stay with a family friend, Slater Bologna who lives in [place]. Pt shares she has a rollator, RW, wheelchair, and canes. She is not on oxygen  at baseline. Pt is agreeable to [facility] services but, adamantly declines SNF placement. Pt does not have contact information for her family friend. CSW left VM for SW at [facility] and for daughter to attempt to verify information and obtain additional contact information. ICM will continue to follow.   Expected Discharge Plan: Skilled Nursing Facility Barriers to Discharge: Continued Medical Work up   Patient Goals and CMS Choice Patient states their goals for this hospitalization and ongoing recovery are:: To go to stay with friend          Expected Discharge Plan and Services In-house Referral: Clinical Social Work Discharge Planning Services: NA Post Acute Care Choice: Home Health Living arrangements for the past 2 months: Skilled Nursing Facility                 DME Arranged: N/A DME Agency: NA                  Prior Living Arrangements/Services Living arrangements for the past 2 months: Skilled Nursing Facility Lives with:: Facility Resident Patient language and need for interpreter reviewed:: Yes Do you feel safe going back to the place where you live?: Yes      Need for Family Participation in Patient Care: Yes (Comment) Care giver support system in place?: Yes (comment) Current home  services: DME (wheelchair, rollator, RW x2, cane x2) Criminal Activity/Legal Involvement Pertinent to Current Situation/Hospitalization: No - Comment as needed  Activities of Daily Living   ADL Screening (condition at time of admission) Independently performs ADLs?: Yes (appropriate for developmental age) Is the patient deaf or have difficulty hearing?: No Does the patient have difficulty seeing, even when wearing glasses/contacts?: No Does the patient have difficulty concentrating, remembering, or making decisions?: No  Permission Sought/Granted Permission sought to share information with : Facility Medical Sales Representative, Family Supports Permission granted to share information with : Yes, Verbal Permission Granted     Permission granted to share info w AGENCY: [place], [place]        Emotional Assessment Appearance:: Appears stated age Attitude/Demeanor/Rapport: Engaged Affect (typically observed): Pleasant Orientation: : Oriented to Self, Oriented to Place Alcohol / Substance Use: Not Applicable Psych Involvement: No (comment)  Admission diagnosis:  Influenza A [J10.1] Acute respiratory failure with hypoxia (HCC) [J96.01] Patient Active Problem List   Diagnosis Date Noted   Acute respiratory failure with hypoxia (HCC) 11/09/2024   Symptomatic carotid artery stenosis, left 07/24/2022   Bilateral leg edema 05/25/2017   Precordial chest pain 05/25/2017   Acute coronary syndrome (HCC) 08/19/2016    Class: Acute   Contusion, multiple sites 06/01/2016   Degenerative spondylolisthesis 05/09/2016   Intractable nausea and vomiting 03/10/2016   Abdominal pain  03/10/2016   Abnormal thyroid  function test 03/08/2016   Chest pain, unspecified 03/07/2016   Epigastric abdominal pain 03/07/2016   Cholecystitis, acute 03/07/2016   Chronic pain syndrome 03/07/2016   Prediabetes 03/07/2016   Obesity, morbid (HCC) 03/07/2016   Elevated troponin 12/21/2015   Fall 12/21/2015    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   Erythrocytosis    Essential hypertension 06/29/2015   Spinal stenosis, lumbar region, 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   Anxiety attack 03/08/2012   COPD (chronic obstructive pulmonary disease) (HCC) 03/05/2012   PCP:  Holli Vernell DEL, NP Pharmacy:   [facility] [place], [place] - [address] [place] [address] Phone: [phone] Fax: [phone]     Social Drivers of Health (SDOH) Social History: SDOH Screenings   Food Insecurity: No Food Insecurity (11/09/2024)  Housing: High Risk (11/09/2024)  Transportation Needs: No Transportation Needs (11/09/2024)  Utilities: Not At Risk (11/09/2024)  Financial Resource Strain: High Risk (09/16/2024)   Received from [facility]  Physical Activity: Insufficiently Active (09/16/2024)   Received from [facility]  Social Connections: Socially Isolated (11/09/2024)  Stress: Stress Concern Present (09/16/2024)   Received from [facility]  Tobacco Use: High Risk (11/09/2024)  Health Literacy: Medium Risk (09/16/2024)   Received from [facility]   SDOH Interventions: Housing Interventions: Inpatient TOC, Intervention Not Indicated Social Connections Interventions: Inpatient TOC, Intervention Not Indicated   Readmission Risk Interventions    11/10/2024    3:30 PM  Readmission Risk Prevention Plan  Post Dischage Appt Complete  Medication Screening Complete  Transportation Screening Complete

## 2024-11-10 NOTE — Plan of Care (Signed)
" °  Problem: Acute Rehab PT Goals(only PT should resolve) Goal: Pt Will Go Supine/Side To Sit Outcome: Progressing Flowsheets (Taken 11/10/2024 1602) Pt will go Supine/Side to Sit: with supervision Goal: Patient Will Transfer Sit To/From Stand Outcome: Progressing Flowsheets (Taken 11/10/2024 1602) Patient will transfer sit to/from stand:  with supervision  with contact guard assist Goal: Pt Will Transfer Bed To Chair/Chair To Bed Outcome: Progressing Flowsheets (Taken 11/10/2024 1602) Pt will Transfer Bed to Chair/Chair to Bed:  with supervision  with contact guard assist Goal: Pt Will Ambulate Outcome: Progressing Flowsheets (Taken 11/10/2024 1602) Pt will Ambulate:  75 feet  with supervision  with contact guard assist  with rolling walker   4:03 PM, 11/10/24 Lynwood Music, MPT Physical Therapist with [facility] [phone] office [phone] mobile phone  "

## 2024-11-10 NOTE — Progress Notes (Addendum)
 " PROGRESS NOTE  Carla [name], is a 74 y.o. female, DOB - [birth year], FMW:[id number]  Admit date - 11/09/2024   Admitting Physician Brayton GORMAN Lye, MD  Outpatient Primary MD for the patient is Hyler, Vernell DEL, NP  LOS - 1  Chief Complaint  Patient presents with   Respiratory Distress      Brief Narrative:  74 y.o. female, with a history of hypertension, COPD, osteoarthritis of the knee, GERD, multiple cerebrovascular accidents, coronary artery disease, hypothyroidism, and tobacco use, recent admission at [facility] in December with diagnosis of seizure, started on Keppra , apparently [facility].    -Assessment and Plan: 1)Acute respiratory failure with hypoxia Influenza A infection COPD exacerbation --Oxygen  currently weaned down to 2 L of oxygen  via nasal cannula -Continue Tamiflu  and bronchodilators and mucolytics WBC 9.1 ---WBC may trend up due to septic -Continue IV Solu-Medrol  -  2)Acute  diastolic HF dysfunction exacerbation -echo with preserved EF -- Chest imaging studies on admission consistent with CHF - proBNP--- 24, 815 >>20, 886 --Dyspnea and hypoxia improving Continue Lasix  IV 40 mg daily - Daily weight and strict I's and O's as ordered  3)History of seizure -Continue with Keppra   4)Hypothyroidism--continue levothyroxine    GERD --Continue Protonix    History of CAD status post stent in 2017 History of multiple CVAs History of left carotid stenosis status post endarterectomy -Patient supposed to be on aspirin , Plavix  and statin, but for unclear reason they were stopped on recent discharge from [facility],  - Continue aspirin , atorvastatin  and Plavix   Chronic pain/neuropathy --continue Cymbalta  and Zanaflex    Hypertension - Continue  metoprolol  - Hold lisinopril -monitor renal function closely  Tobacco abuse - Continue nicotine  patch  Chronic anemia--- Hgb around 10, no bleeding concerns -Monitor closely while on DAPT   Class 2  Obesity with multiple comorbidities -Low calorie diet, portion control and increase physical activity discussed with patient -Body mass index is 32.24 kg/m.  Generalized weakness and deconditioning--PT eval appreciated recommends SNF rehab   Status is: Inpatient   Disposition: The patient is from: SNF-[facility]              Anticipated d/c is to: SNF              Anticipated d/c date is: 1 day              Patient currently is not medically stable to d/c. Barriers: Not Clinically Stable-   Code Status :  -  Code Status: Full Code   Family Communication:    NA (patient is alert, awake and coherent)   DVT Prophylaxis  :   - SCDs  enoxaparin  (LOVENOX ) injection 40 mg Start: 11/09/24 2200   Lab Results  Component Value Date   PLT 337 11/10/2024   Inpatient Medications  Scheduled Meds:  aspirin   81 mg Oral BH-q7a   atorvastatin   80 mg Oral q1800   clopidogrel   75 mg Oral Daily   dextromethorphan -guaiFENesin   1 tablet Oral BID   DULoxetine   60 mg Oral Daily   enoxaparin  (LOVENOX ) injection  40 mg Subcutaneous Q24H   fluticasone  furoate-vilanterol  1 puff Inhalation Daily   furosemide   40 mg Intravenous Daily   ipratropium-albuterol   3 mL Nebulization TID   levETIRAcetam   500 mg Oral BID   levothyroxine   25 mcg Oral QAC breakfast   lisinopril   20 mg Oral BH-q7a   methylPREDNISolone  (SOLU-MEDROL ) injection  125 mg Intravenous Daily   metoprolol  succinate  25 mg Oral  BH-q7a   nicotine   14 mg Transdermal Daily   oseltamivir   30 mg Oral BID   pantoprazole   40 mg Oral Daily   tiZANidine   2 mg Oral TID   Continuous Infusions: PRN Meds:.acetaminophen  **OR** acetaminophen , albuterol , guaiFENesin , nitroGLYCERIN , polyethylene glycol   Anti-infectives (From admission, onward)    Start     Dose/Rate Route Frequency Ordered Stop   11/09/24 2200  oseltamivir  (TAMIFLU ) capsule 30 mg        30 mg Oral 2 times daily 11/09/24 1526 11/14/24 2159   11/09/24 1500  oseltamivir   (TAMIFLU ) capsule 75 mg        75 mg Oral  Once 11/09/24 1458 11/09/24 1532         Subjective: Joen Stage today has no fevers, no emesis,  No chest pain,   - Cough and dyspnea improving -Voiding well   Objective: Vitals:   11/09/24 2213 11/10/24 0209 11/10/24 0514 11/10/24 1351  BP: (!) 140/56 132/64 (!) 158/73 (!) 152/90  Pulse: 61 (!) 51 (!) 57 (!) 53  Resp: 18 18 18 19   Temp: 98.4 F (36.9 C) 98 F (36.7 C) (!) 97.5 F (36.4 C) 98.1 F (36.7 C)  TempSrc: Oral Oral Oral Oral  SpO2: 96% 98% 97% 94%  Weight:   85.2 kg   Height:        Intake/Output Summary (Last 24 hours) at 11/10/2024 1909 Last data filed at 11/10/2024 1737 Gross per 24 hour  Intake 240 ml  Output --  Net 240 ml   Filed Weights   11/09/24 1837 11/10/24 0514  Weight: 86 kg 85.2 kg    Physical Exam Gen:- Awake Alert, in no acute distress HEENT:- [place].AT, No sclera icterus Neck-Supple Neck,No JVD,.  Lungs-improving air movement, scattered wheezes bilaterally CV- S1, S2 normal, regular  Abd-  +ve B.Sounds, Abd Soft, No tenderness,    Extremity/Skin:-Improving lower extremity edema, pedal pulses present  Psych-affect is appropriate, oriented x3 Neuro-no new focal deficits, no tremors  Data Reviewed: I have personally reviewed following labs and imaging studies  CBC: Recent Labs  Lab 11/09/24 1150 11/10/24 0435  WBC 11.8* 9.1  HGB 10.0* 9.7*  HCT 31.7* 30.2*  MCV 93.0 91.8  PLT 387 337   Basic Metabolic Panel: Recent Labs  Lab 11/09/24 1150 11/10/24 0435  NA 139 141  K 4.3 4.3  CL 102 102  CO2 21* 27  GLUCOSE 135* 140*  BUN 12 16  CREATININE 1.03* 1.01*  CALCIUM  8.7* 8.9   GFR: Estimated Creatinine Clearance: 54 mL/min (A) (by C-G formula based on SCr of 1.01 mg/dL (H)).  BNP (last 3 results) Recent Labs    11/09/24 1150 11/10/24 0435  PROBNP 24,815.0* 79,113.9*   Recent Results (from the past 240 hours)  Resp Panel by RT-PCR (Flu A&B, Covid) Anterior Nasal Swab      Status: Abnormal   Collection Time: 11/09/24 12:16 PM   Specimen: Anterior Nasal Swab  Result Value Ref Range Status   SARS Coronavirus 2 by RT PCR NEGATIVE NEGATIVE Final    Comment: (NOTE) SARS-CoV-2 target nucleic acids are NOT DETECTED.  The SARS-CoV-2 RNA is generally detectable in upper respiratory specimens during the acute phase of infection. The lowest concentration of SARS-CoV-2 viral copies this assay can detect is 138 copies/mL. A negative result does not preclude SARS-Cov-2 infection and should not be used as the sole basis for treatment or other patient management decisions. A negative result may occur with  improper specimen collection/handling,  submission of specimen other than nasopharyngeal swab, presence of viral mutation(s) within the areas targeted by this assay, and inadequate number of viral copies(<138 copies/mL). A negative result must be combined with clinical observations, patient history, and epidemiological information. The expected result is Negative.  Fact Sheet for Patients:  bloggercourse.com  Fact Sheet for Healthcare Providers:  seriousbroker.it  This test is no t yet approved or cleared by the [place]  FDA and  has been authorized for detection and/or diagnosis of SARS-CoV-2 by FDA under an Emergency Use Authorization (EUA). This EUA will remain  in effect (meaning this test can be used) for the duration of the COVID-19 declaration under Section 564(b)(1) of the Act, 21 U.S.C.section 360bbb-3(b)(1), unless the authorization is terminated  or revoked sooner.       Influenza A by PCR POSITIVE (A) NEGATIVE Final   Influenza B by PCR NEGATIVE NEGATIVE Final    Comment: (NOTE) The Xpert Xpress SARS-CoV-2/FLU/RSV plus assay is intended as an aid in the diagnosis of influenza from Nasopharyngeal swab specimens and should not be used as a sole basis for treatment. Nasal washings  and aspirates are unacceptable for Xpert Xpress SARS-CoV-2/FLU/RSV testing.  Fact Sheet for Patients: bloggercourse.com  Fact Sheet for Healthcare Providers: seriousbroker.it  This test is not yet approved or cleared by the [place]  FDA and has been authorized for detection and/or diagnosis of SARS-CoV-2 by FDA under an Emergency Use Authorization (EUA). This EUA will remain in effect (meaning this test can be used) for the duration of the COVID-19 declaration under Section 564(b)(1) of the Act, 21 U.S.C. section 360bbb-3(b)(1), unless the authorization is terminated or revoked.  Performed at [facility], [address], [place], [place] [address]     Radiology Studies: [facility] 1 View Result Date: 11/09/2024 CLINICAL DATA:  Shortness of breath. EXAM: PORTABLE CHEST 1 VIEW COMPARISON:  Chest radiograph dated 09/29/2024. FINDINGS: There is diffuse interstitial reticulonodular densities, new since the prior radiograph may represent edema or atypical pneumonia. Clinical correlation is recommended. Small bilateral pleural effusions and right lung base subsegmental atelectasis. No pneumothorax. Mild cardiomegaly. No acute osseous pathology. IMPRESSION: Mild cardiomegaly with interval development of mild CHF and small bilateral pleural effusions. Pneumonia is not excluded Electronically Signed   By: Vanetta Chou M.D.   On: 11/09/2024 13:08   Scheduled Meds:  aspirin   81 mg Oral BH-q7a   atorvastatin   80 mg Oral q1800   clopidogrel   75 mg Oral Daily   dextromethorphan -guaiFENesin   1 tablet Oral BID   DULoxetine   60 mg Oral Daily   enoxaparin  (LOVENOX ) injection  40 mg Subcutaneous Q24H   fluticasone  furoate-vilanterol  1 puff Inhalation Daily   furosemide   40 mg Intravenous Daily   ipratropium-albuterol   3 mL Nebulization TID   levETIRAcetam   500 mg Oral BID   levothyroxine   25 mcg Oral QAC breakfast   lisinopril   20 mg Oral  BH-q7a   methylPREDNISolone  (SOLU-MEDROL ) injection  125 mg Intravenous Daily   metoprolol  succinate  25 mg Oral BH-q7a   nicotine   14 mg Transdermal Daily   oseltamivir   30 mg Oral BID   pantoprazole   40 mg Oral Daily   tiZANidine   2 mg Oral TID   Continuous Infusions:   LOS: 1 day   Rendall Carwin M.D on 11/10/2024 at 7:09 PM  Go to [email or web address] - for contact info  [facility] - Office  [phone]  If 7PM-7AM, please contact night-coverage [email or web address] 11/10/2024, 7:09 PM    "

## 2024-11-10 NOTE — Evaluation (Signed)
 Physical Therapy Evaluation Patient Details Name: Carla [name] MRN: [id number] DOB: 01/04/1951 Today's Date: 11/10/2024  History of Present Illness  Carla [name]  is a 74 y.o. female, with a history of hypertension, COPD, osteoarthritis of the knee, GERD, multiple cerebrovascular accidents, coronary artery disease, hypothyroidism, and tobacco use, recent admission at [facility] in December with diagnosis of seizure, started on Keppra , apparently [facility] sub[facility] rehab.  Patient was sent to ED today secondary to complaints of shortness of breath and respiratory distress, she was noted to have difficulty breathing, feeling sick over last 24 hours, as well having some nausea and vomiting, there is report of some sick contacts in the nursing home.  -Her workup in ED was noted her being febrile 100.5, saturating 85% on room air, x-ray with evidence of volume overload and pleural effusion, respiratory panel significant for positive influenza A, she was started on steroids, nebulizer treatment, requiring 2 L oxygen  and [facility] consulted to admit.   Clinical Impression  Patient demonstrates slow labored movement for sitting up at bedside, able to transfer to chair with hand held assist  with labored movement, safer using RW and tolerated ambulating in room with  slow labored movement while on 2 LPM O2 with SpO2 at 93%. Patient tolerated sitting up in chair after therapy. Patient will benefit from continued skilled physical therapy in hospital and recommended venue below to increase strength, balance, endurance for safe ADLs and gait.           If plan is discharge home, recommend the following: A little help with walking and/or transfers;A little help with bathing/dressing/bathroom;Help with stairs or ramp for entrance;Assist for transportation   Can travel by private vehicle   Yes    Equipment Recommendations None recommended by PT  Recommendations for Other Services        Functional Status Assessment Patient has had a recent decline in their functional status and demonstrates the ability to make significant improvements in function in a reasonable and predictable amount of time.     Precautions / Restrictions Precautions Precautions: Fall Recall of Precautions/Restrictions: Intact Restrictions Weight Bearing Restrictions Per Provider Order: No      Mobility  Bed Mobility Overal bed mobility: Needs Assistance Bed Mobility: Supine to Sit     Supine to sit: Contact guard, Min assist     General bed mobility comments: increased time, labored movement    Transfers Overall transfer level: Needs assistance Equipment used: Rolling walker (2 wheels) Transfers: Sit to/from Stand, Bed to chair/wheelchair/BSC Sit to Stand: Contact guard assist, Min assist   Step pivot transfers: Contact guard assist, Min assist       General transfer comment: increased time, labored movement    Ambulation/Gait Ambulation/Gait assistance: Min assist Gait Distance (Feet): 45 Feet Assistive device: Rolling walker (2 wheels) Gait Pattern/deviations: Decreased step length - right, Decreased step length - left, Decreased stride length Gait velocity: decreased     General Gait Details: slow labored movement without loss of balance, but limited mostly due ot fatigue, on 2 LPM O2 with SpO2 at 93%  Stairs            Wheelchair Mobility     Tilt Bed    Modified Rankin (Stroke Patients Only)       Balance Overall balance assessment: Needs assistance Sitting-balance support: Feet supported, No upper extremity supported Sitting balance-Leahy Scale: Fair Sitting balance - Comments: fair/good seated at EOB   Standing balance support: During functional activity, No upper  extremity supported Standing balance-Leahy Scale: Poor Standing balance comment: fair using RW                             Pertinent Vitals/Pain Pain Assessment Pain  Assessment: Faces Faces Pain Scale: Hurts a little bit Pain Location: low back, legs, joints Pain Descriptors / Indicators: Discomfort Pain Intervention(s): Monitored during session, Repositioned    Home Living Family/patient expects to be discharged to:: Skilled nursing facility                        Prior Function Prior Level of Function : Needs assist       Physical Assist : ADLs (physical);Mobility (physical) Mobility (physical): Bed mobility;Transfers;Gait;Stairs ADLs (physical): IADLs Mobility Comments: Pt reports ambulation by pushing a w/c or with use of a rollator at the facility. ADLs Comments: Pt reports independence with ADL's. IADL assist by SNF staff likely.     Extremity/Trunk Assessment   Upper Extremity Assessment Upper Extremity Assessment: Defer to OT evaluation    Lower Extremity Assessment Lower Extremity Assessment: Generalized weakness    Cervical / Trunk Assessment Cervical / Trunk Assessment: Kyphotic  Communication   Communication Communication: No apparent difficulties    Cognition Arousal: Alert Behavior During Therapy: WFL for tasks assessed/performed                             Following commands: Intact       Cueing Cueing Techniques: Verbal cues, Tactile cues     General Comments      Exercises     Assessment/Plan    PT Assessment Patient needs continued PT services  PT Problem List Decreased strength;Decreased activity tolerance;Decreased balance;Decreased mobility       PT Treatment Interventions DME instruction;Gait training;Stair training;Functional mobility training;Therapeutic activities;Therapeutic exercise;Balance training;Patient/family education    PT Goals (Current goals can be found in the Care Plan section)  Acute Rehab PT Goals Patient Stated Goal: return home PT Goal Formulation: With patient Time For Goal Achievement: 11/24/24 Potential to Achieve Goals: Good    Frequency Min  3X/week     Co-evaluation PT/OT/SLP Co-Evaluation/Treatment: Yes Reason for Co-Treatment: To address functional/ADL transfers PT goals addressed during session: Mobility/safety with mobility;Balance;Proper use of DME OT goals addressed during session: ADL's and self-care       AM-PAC PT 6 Clicks Mobility  Outcome Measure Help needed turning from your back to your side while in a flat bed without using bedrails?: A Little Help needed moving from lying on your back to sitting on the side of a flat bed without using bedrails?: A Little Help needed moving to and from a bed to a chair (including a wheelchair)?: A Little Help needed standing up from a chair using your arms (e.g., wheelchair or bedside chair)?: A Little Help needed to walk in hospital room?: A Little Help needed climbing 3-5 steps with a railing? : A Lot 6 Click Score: 17    End of Session Equipment Utilized During Treatment: Oxygen  Activity Tolerance: Patient tolerated treatment well;Patient limited by fatigue Patient left: in chair;with call bell/phone within reach Nurse Communication: Mobility status PT Visit Diagnosis: Unsteadiness on feet (R26.81);Other abnormalities of gait and mobility (R26.89);Muscle weakness (generalized) (M62.81)    Time: 8861-8793 PT Time Calculation (min) (ACUTE ONLY): 28 min   Charges:   PT Evaluation $PT Eval Moderate Complexity: 1 Mod PT Treatments $  Therapeutic Activity: 23-37 mins PT General Charges $$ ACUTE PT VISIT: 1 Visit         4:01 PM, 11/10/24 Lynwood Music, MPT Physical Therapist with [facility] [phone] office [phone] mobile phone

## 2024-11-10 NOTE — Plan of Care (Signed)
   Problem: Activity: Goal: Risk for activity intolerance will decrease Outcome: Progressing   Problem: Coping: Goal: Level of anxiety will decrease Outcome: Progressing

## 2024-11-10 NOTE — Plan of Care (Signed)
" °  Problem: Acute Rehab OT Goals (only OT should resolve) Goal: Pt. Will Perform Grooming Flowsheets (Taken 11/10/2024 1616) Pt Will Perform Grooming:  with modified independence  standing Goal: Pt. Will Perform Upper Body Dressing Flowsheets (Taken 11/10/2024 1616) Pt Will Perform Upper Body Dressing:  with modified independence  sitting Goal: Pt. Will Perform Lower Body Dressing Flowsheets (Taken 11/10/2024 1616) Pt Will Perform Lower Body Dressing:  with contact guard assist  with adaptive equipment  sitting/lateral leans Goal: Pt. Will Transfer To Toilet Flowsheets (Taken 11/10/2024 1616) Pt Will Transfer to Toilet:  with modified independence  ambulating Goal: Pt. Will Perform Toileting-Clothing Manipulation Flowsheets (Taken 11/10/2024 1616) Pt Will Perform Toileting - Clothing Manipulation and hygiene: with modified independence Goal: Pt/Caregiver Will Perform Home Exercise Program Flowsheets (Taken 11/10/2024 1616) Pt/caregiver will Perform Home Exercise Program:  Increased strength  Both right and left upper extremity  Independently  Ceclia Koker OT, MOT  "

## 2024-11-10 NOTE — Progress Notes (Signed)
 Primary nurse at bedside (myself) during shift assessment Pt asked is that a volkswagen holding the blinds in place Informed pt that is was just the window seal and she was in agreeable with me. Pt during assessment could state her name/DOB and that she was at [facility], was unable to state why she was here and what year it is. No complaint of pain other than chronic lower back pain. Will continue with plan of care.

## 2024-11-10 NOTE — Evaluation (Addendum)
 Occupational Therapy Evaluation Patient Details Name: Carla [name] MRN: [id number] DOB: April 25, 1951 Today's Date: 11/10/2024   History of Present Illness   Carla [name]  is a 74 y.o. female, with a history of hypertension, COPD, osteoarthritis of the knee, GERD, multiple cerebrovascular accidents, coronary artery disease, hypothyroidism, and tobacco use, recent admission at [facility] in December with diagnosis of seizure, started on Keppra , apparently [name] subacute rehab.  Patient was sent to ED today secondary to complaints of shortness of breath and respiratory distress, she was noted to have difficulty breathing, feeling sick over last 24 hours, as well having some nausea and vomiting, there is report of some sick contacts in the nursing home.  -Her workup in ED was noted her being febrile 100.5, saturating 85% on room air, x-ray with evidence of volume overload and pleural effusion, respiratory panel significant for positive influenza A, she was started on steroids, nebulizer treatment, requiring 2 L oxygen  and [facility] hospital[facility] consulted to admit. (per MD)     Clinical Impressions Pt agreeable to OT and PT co-evaluation. Pt from a SNF where she reports independence with ADL's. Pt demonstrates need for mod to max A for lower body ADL tasks and set up to min A for upper body tasks. Pt required CGA for bed mobility and CGA to min A for functional transfers with and without RW. Pt left in the chair with call bell within reach. Pt will benefit from continued OT in the hospital to increase strength, balance, and endurance for safe ADL's.        If plan is discharge home, recommend the following:   A little help with walking and/or transfers;A lot of help with bathing/dressing/bathroom;Assist for transportation;Direct supervision/assist for medications management;Help with stairs or ramp for entrance     Functional Status Assessment   Patient has had a recent decline in their  functional status and demonstrates the ability to make significant improvements in function in a reasonable and predictable amount of time.     Equipment Recommendations   None recommended by OT            Precautions/Restrictions   Precautions Precautions: Fall Recall of Precautions/Restrictions: Intact Restrictions Weight Bearing Restrictions Per Provider Order: No     Mobility Bed Mobility Overal bed mobility: Needs Assistance Bed Mobility: Supine to Sit     Supine to sit: Contact guard, Min assist     General bed mobility comments: labored movement    Transfers Overall transfer level: Needs assistance Equipment used: 2 person hand held assist Transfers: Sit to/from Stand, Bed to chair/wheelchair/BSC Sit to Stand: Contact guard assist, Min assist     Step pivot transfers: Min assist     General transfer comment: EOB to chair with 2 hand held assist.      Balance Overall balance assessment: Needs assistance Sitting-balance support: Feet supported, No upper extremity supported Sitting balance-Leahy Scale: Fair Sitting balance - Comments: fair/good seated at EOB   Standing balance support: During functional activity, Bilateral upper extremity supported Standing balance-Leahy Scale: Fair Standing balance comment: using RW                           ADL either performed or assessed with clinical judgement   ADL Overall ADL's : Needs assistance/impaired Eating/Feeding: Set up;Sitting   Grooming: Set up;Sitting   Upper Body Bathing: Set up;Minimal assistance;Sitting   Lower Body Bathing: Moderate assistance;Maximal assistance;Sitting/lateral leans   Upper Body Dressing :  Set up;Minimal assistance;Sitting   Lower Body Dressing: Moderate assistance;Maximal assistance;Sitting/lateral leans Lower Body Dressing Details (indicate cue type and reason): Pt able to doff socks using only her feet. Unable to don socks. Toilet Transfer: Contact  guard assist;Minimal assistance;Ambulation;Stand-pivot;Rolling walker (2 wheels) Toilet Transfer Details (indicate cue type and reason): EOB to chair and ambulation in the room with RW. Toileting- Clothing Manipulation and Hygiene: Moderate assistance;Sitting/lateral lean       Functional mobility during ADLs: Contact guard assist;Minimal assistance;Rolling walker (2 wheels)       Vision Baseline Vision/History: 1 Wears glasses (Pt needs them but does not have any.) Ability to See in Adequate Light: 1 Impaired Patient Visual Report: No change from baseline Additional Comments: Baseline deficits.     Perception Perception: Not tested       Praxis Praxis: Not tested       Pertinent Vitals/Pain Pain Assessment Pain Assessment: Faces Faces Pain Scale: Hurts a little bit Pain Location: low back, legs, joints Pain Descriptors / Indicators: Discomfort Pain Intervention(s): Monitored during session, Repositioned     Extremity/Trunk Assessment Upper Extremity Assessment Upper Extremity Assessment: Generalized weakness   Lower Extremity Assessment Lower Extremity Assessment: Defer to PT evaluation   Cervical / Trunk Assessment Cervical / Trunk Assessment: Kyphotic   Communication Communication Communication: No apparent difficulties   Cognition Arousal: Alert Behavior During Therapy: WFL for tasks assessed/performed Cognition: History of cognitive impairments                               Following commands: Intact       Cueing  General Comments   Cueing Techniques: Verbal cues;Tactile cues  Pt needing 2 LPM supplemental O2 to maintain saturation >90%.               Home Living Family/patient expects to be discharged to:: Skilled nursing facility                                        Prior Functioning/Environment Prior Level of Function : Needs assist       Physical Assist : ADLs (physical) Mobility (physical): Bed  mobility;Transfers;Gait;Stairs ADLs (physical): IADLs Mobility Comments: Pt reports ambulation by pushing a w/c or with use of a rollator at the facility. ADLs Comments: Pt reports independence with ADL's. IADL assist by SNF staff likely. Pt reported use of sock aid and long handle sponge when completing ADL's at home.     OT Problem List: Decreased strength;Decreased range of motion;Decreased activity tolerance;Impaired balance (sitting and/or standing);Cardiopulmonary status limiting activity   OT Treatment/Interventions: Self-care/ADL training;Therapeutic exercise;DME and/or AE instruction;Therapeutic activities;Cognitive remediation/compensation;Patient/family education;Balance training      OT Goals(Current goals can be found in the care plan section)   Acute Rehab OT Goals Patient Stated Goal: Improve function. OT Goal Formulation: With patient Time For Goal Achievement: 11/24/24 Potential to Achieve Goals: Good   OT Frequency:  Min 2X/week    Co-evaluation PT/OT/SLP Co-Evaluation/Treatment: Yes Reason for Co-Treatment: To address functional/ADL transfers PT goals addressed during session: Mobility/safety with mobility;Balance;Proper use of DME OT goals addressed during session: ADL's and self-care                        End of Session Equipment Utilized During Treatment: Rolling walker (2 wheels);Oxygen   Activity Tolerance: Patient tolerated treatment well  Patient left: in chair;with call bell/phone within reach  OT Visit Diagnosis: Unsteadiness on feet (R26.81);Other abnormalities of gait and mobility (R26.89);Muscle weakness (generalized) (M62.81)                Time: 8859-8797 OT Time Calculation (min): 22 min Charges:  OT General Charges $OT Visit: 1 Visit OT Evaluation $OT Eval Low Complexity: 1 Low   Anthoney Sheppard OT, MOT  Jayson Person 11/10/2024, 4:12 PM

## 2024-11-11 DIAGNOSIS — J9601 Acute respiratory failure with hypoxia: Secondary | ICD-10-CM | POA: Diagnosis not present

## 2024-11-11 LAB — RENAL FUNCTION PANEL
Albumin: 3 g/dL — ABNORMAL LOW (ref 3.5–5.0)
Anion gap: 14 (ref 5–15)
BUN: 28 mg/dL — ABNORMAL HIGH (ref 8–23)
CO2: 24 mmol/L (ref 22–32)
Calcium: 9 mg/dL (ref 8.9–10.3)
Chloride: 100 mmol/L (ref 98–111)
Creatinine, Ser: 1.11 mg/dL — ABNORMAL HIGH (ref 0.44–1.00)
GFR, Estimated: 53 mL/min — ABNORMAL LOW
Glucose, Bld: 133 mg/dL — ABNORMAL HIGH (ref 70–99)
Phosphorus: 3.8 mg/dL (ref 2.5–4.6)
Potassium: 4.2 mmol/L (ref 3.5–5.1)
Sodium: 139 mmol/L (ref 135–145)

## 2024-11-11 LAB — CBC
HCT: 30.4 % — ABNORMAL LOW (ref 36.0–46.0)
Hemoglobin: 9.9 g/dL — ABNORMAL LOW (ref 12.0–15.0)
MCH: 29.6 pg (ref 26.0–34.0)
MCHC: 32.6 g/dL (ref 30.0–36.0)
MCV: 90.7 fL (ref 80.0–100.0)
Platelets: 368 10*3/uL (ref 150–400)
RBC: 3.35 MIL/uL — ABNORMAL LOW (ref 3.87–5.11)
RDW: 15.3 % (ref 11.5–15.5)
WBC: 18.8 10*3/uL — ABNORMAL HIGH (ref 4.0–10.5)
nRBC: 0.1 % (ref 0.0–0.2)

## 2024-11-11 MED ORDER — METHYLPREDNISOLONE SODIUM SUCC 125 MG IJ SOLR
60.0000 mg | Freq: Every day | INTRAMUSCULAR | Status: DC
  Administered 2024-11-11 – 2024-11-15 (×5): 60 mg via INTRAVENOUS
  Filled 2024-11-11 (×5): qty 2

## 2024-11-11 NOTE — TOC Progression Note (Signed)
 Transition of Care [facility]) - Progression Note    Patient Details  Name: Carla [name] MRN: [id number] Date of Birth: [birth year]  [facility]) CM/SW Contact  Hoy DELENA Bigness, LCSW Phone Number: 11/11/2024, 11:20 AM  Clinical Narrative:    CSW spoke with Wednesday, child psychotherapist at [employer]. Per Wednesday they had a meeting with pt and outcome of meeting was that pt would remain at [facility] as there is no safe discharge plan at this time and they do not feel that pt has capacity to make decisions currently. Facility exploring possible need for guardianship for pt. Facility does not have contact information for this family/friend Slater Bologna and have the same contact information for daughter listed in pt's chart. CSW attempted to reach pt's daughter for 3rd time with no answer; voicemail was left. Current plan will be for pt to return to [facility] at discharge.    Expected Discharge Plan: Skilled Nursing Facility Barriers to Discharge: Continued Medical Work up               Expected Discharge Plan and Services In-house Referral: Clinical Social Work Discharge Planning Services: NA Post Acute Care Choice: Home Health Living arrangements for the past 2 months: Skilled Nursing Facility                 DME Arranged: N/A DME Agency: NA                   Social Drivers of Health (SDOH) Interventions SDOH Screenings   Food Insecurity: No Food Insecurity (11/09/2024)  Housing: High Risk (11/09/2024)  Transportation Needs: No Transportation Needs (11/09/2024)  Utilities: Not At Risk (11/09/2024)  Financial Resource Strain: High Risk (09/16/2024)   Received from [facility]  Physical Activity: Insufficiently Active (09/16/2024)   Received from [facility]  Social Connections: Socially Isolated (11/09/2024)  Stress: Stress Concern Present (09/16/2024)   Received from [facility]  Tobacco Use: High Risk (11/09/2024)  Health Literacy:  Medium Risk (09/16/2024)   Received from [facility]    Readmission Risk Interventions    11/10/2024    3:30 PM  Readmission Risk Prevention Plan  Post Dischage Appt Complete  Medication Screening Complete  Transportation Screening Complete

## 2024-11-11 NOTE — Progress Notes (Signed)
 Heart Failure Nurse Navigator Progress Note  PCP: Hyler, Vernell DEL, NP PCP-Cardiologist: Fairy Glean Ship, MD Admission Diagnosis: Influenza A  Acute respiratory failure with hypoxia [facility]) Admitted from: Home BIB [facility]  [facility] Patient- Room 309 Presentation:   Carla [name] is a 74 y.o. female. She presented with worsening shortness breath, nausea and vomiting.  History of hypertension, COPD, osteoarthritis of the knee, GERD, multiple strokes, hypothyroidism, tobacco use and CAD. Per notes currently resides at a nursing home.  Recent admission at [facility] with a seizure. Pro-BNP [id number]. HS-Troponin 58. CXR images on admission consistent with CHF.    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   Dizziness    Frequent falls    GERD (gastroesophageal reflux disease)    Hypertension    Memory difficulty 2015   Memory loss    Neurology - Dr Eduard Oman   Migraine    4-5/year (08/19/2016); @ least once/month now (05/25/2017)   Myocardial infarction (HCC) 08/18/2016   Neuropathy    Pain management    Pneumonia several times   Pre-diabetes    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   likely related to situational and psychosocial stress, depression, pain   Shortness of breath dyspnea    with exertion   Spinal stenosis, lumbar region, 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   Stroke (HCC) 01/2022   ischemic frontal lobe   Unspecified convulsions (HCC)    Uses roller walker    right sided weakness   Weakness of both legs      Social History   Socioeconomic History   Marital status: Widowed    Spouse name:  Not on file   Number of children: 2   Years of education: 68   Highest education level: Not on file  Occupational History   Occupation: Retired Statistician and [employer]  Tobacco Use   Smoking status: Every Day    Current packs/day: 0.50    Average packs/day: 0.5 packs/day for 54.3 years (27.1 ttl pk-yrs)    Types: Cigarettes    Start date: 08/05/1970   Smokeless tobacco: Never  Vaping Use   Vaping status: Never Used  Substance and Sexual Activity   Alcohol use: Not Currently    Comment: occasional   Drug use: No   Sexual activity: Not Currently    Birth control/protection: Post-menopausal  Other Topics Concern   Not on file  Social History Narrative   03/26/22 Lives alone   Right and left handed   Caffeine use: very rare .Has coke 2 times a week.   Multiple uncles and aunts on her father's side have heart disease.   Social Drivers of Health   Tobacco Use: High Risk (11/09/2024)   Patient History    Smoking Tobacco Use: Every Day    Smokeless Tobacco Use: Never    Passive Exposure: Not on file  Financial Resource Strain: High Risk (09/16/2024)   Received from [facility]   Overall Financial Resource Strain (CARDIA)    How hard is it for you to pay for the very basics  like food, housing, medical care, and heating?: Hard  Food Insecurity: No Food Insecurity (11/09/2024)   Epic    Worried About Programme Researcher, Broadcasting/film/video in the Last Year: Never true    Ran Out of Food in the Last Year: Never true  Transportation Needs: No Transportation Needs (11/09/2024)   Epic    Lack of Transportation (Medical): No    Lack of Transportation (Non-Medical): No  Physical Activity: Insufficiently Active (09/16/2024)   Received from [facility]   Exercise Vital Sign    On average, how many days per week do you engage in moderate to strenuous exercise (like a brisk walk)?: 2 days    On average, how many minutes do you engage in exercise at this level?: 20 min  Stress: Stress Concern Present  (09/16/2024)   Received from [facility] - Occupational Stress Questionnaire    Do you feel stress - tense, restless, nervous, or anxious, or unable to sleep at night because your mind is troubled all the time - these days?: To some extent  Social Connections: Socially Isolated (11/09/2024)   Social Connection and Isolation Panel    Frequency of Communication with Friends and Family: More than three times a week    Frequency of Social Gatherings with Friends and Family: Once a week    Attends Religious Services: Never    Database Administrator or Organizations: No    Attends Banker Meetings: Never    Marital Status: Widowed  Depression (PHQ2-9): Not on file  Alcohol Screen: Not on file  Housing: High Risk (11/09/2024)   Epic    Unable to Pay for Housing in the Last Year: No    Number of Times Moved in the Last Year: 2    Homeless in the Last Year: No  Utilities: Not At Risk (11/09/2024)   Epic    Threatened with loss of utilities: No  Health Literacy: Medium Risk (09/16/2024)   Received from [facility]    How often do you need to have someone help you when you read instructions, pamphlets, or other written material from your doctor or pharmacy?: Rarely   Education Assessment and Provision:  Detailed education and instructions provided on heart failure disease management including the following:  Signs and symptoms of Heart Failure When to call the physician Importance of daily weights Low sodium diet Fluid restriction Medication management Anticipated future follow-up appointments  Patient education given on each of the above topics.  Patient acknowledges understanding via teach back method and acceptance of all instructions.  Education Materials:  Living Better With Heart Failure Booklet, HF zone tool, & Daily Weight Tracker Tool.  Patient has scale at home: Living at a nursing home. Patient  has pill box at home: No-pt prefers not to use a pill box.   High Risk Criteria for Readmission and/or Poor Patient Outcomes: Heart failure hospital admissions (last 6 months): 1  No Show rate: 3% Difficult social situation: Per patient memory difficulties.  Patient pleasantly confused. (Shared that she lived at home but documented that she lived in a nursing home. Says she uses [facility] for transportation.  Also shared that she fell and was on the floor for 3 days prior to transport to the hospital).   Demonstrates medication adherence: Yes Primary Language: English Literacy level: Reading, Writing & Comprehension  Barriers of Care:   Daily Weights  Diet & Fluid Restrictions  Considerations/Referrals:  Referral made to Heart Failure Pharmacist Stewardship: N/A Referral made to Heart Failure CSW/NCM TOC: No Referral made to Heart & Vascular TOC clinic: No.  Patient prefers to follow-up with her cardiologist @ [facility].  Items for Follow-up on DC/TOC: Daily Weights Diet  & Fluid Restrictions Continued CHF education  Charmaine Pines, RN, BSN [facility] Heart Failure Navigator Secure Chat Only

## 2024-11-11 NOTE — Progress Notes (Signed)
 Heart Failure Navigator Progress Note  Assessed for Heart & Vascular TOC clinic readiness.  Attempted to reach patient on her room phone @ [facility] 309-no answer at this time.  Attempted her cell phone shortly after and it went to straight to voice mail.  Will reach out to her bedside nurse for assistance contacting her to provide her with CHF education. Patient currently goes to [facility]- Dr. Fairy Glean Ship, MD.    Navigator available for reassessment of patient.   Charmaine Pines, RN, BSN [facility] Heart Failure Navigator Secure Chat Only

## 2024-11-11 NOTE — Progress Notes (Signed)
 [facility] PROGRESS NOTE  Carla [name] (DOB: [birth year]) FMW:[id number] PCP: Holli Vernell DEL, NP  Brief Narrative: Carla [name] is a 74 y.o. female with a history of COPD, multiple CVAs, seizure disorder (recent Dx in Dec 2025), CAD, tobacco use, hypothyroidism who presented to the ED on 11/09/2024 with respiratory distress found to have acute hypoxic respiratory failure due to influenza A infection, AECOPD, and acute on chronic HFpEF.   Subjective: Breathing slightly better, but still wheezy and still on oxygen  with exertional dyspnea not at baseline.   Objective: BP (!) 144/57 (BP Location: Left Arm)   Pulse (!) 54   Temp 97.6 F (36.4 C) (Oral)   Resp 18   Ht 5' 4 (1.626 m)   Wt 83.9 kg   SpO2 98%   BMI 31.76 kg/m   Gen: No distress Pulm: Expiratory wheezes, no crackles. Nonlabored at rest with 2L O2.   CV: Regular bradycardia, no MRG GI: Soft, NT, ND, +BS  Neuro: Alert and interactive. Couldn't recall her daughter's name. No new focal deficits. Ext: Warm, no deformities. Skin: No rashes, lesions or ulcers on visualized skin   Assessment & Plan: Acute respiratory failure with hypoxia Influenza A infection COPD exacerbation - Remains on 2L O2, will aim to wean as evidence of bronchospasm improves (still some wheezing) - Continue tamiflu  (1/28 - 2/1) - Wean methylprednisolone  to 60mg  daily.  - Continue inhaled therapies as ordered.    Acute on chronic HFpEF, HTN:  Chest imaging studies on admission consistent with CHF. ProBNP 24,815  - Appears clinically roughly euvolemic, BUN:Cr and Cr rising. Will hold further lasix  doses.  - Continue metoprolol , though caution with sinus bradycardia and bronchospasm.  - Continue monitoring I/O, weights.   Seizure disorder: No seizure activity noticed at this time.  - Continue recently added keppra    Hypothyroidism:  - Continue synthroid   GERD:  - Continue PPI   History of CAD status post stent in 2017 History of  multiple CVAs History of left carotid stenosis status post endarterectomy - Continue DAPT, atorvastatin , and beta blocker (cardioselective).     Chronic pain/neuropathy:  - Continue duloxetine  and antispasmodic.     Hypertension - Continue metoprolol  - Hold lisinopril -monitor renal function closely   Tobacco abuse - Continue nicotine  patch   Chronic anemia: Stable. No bleeding.    Class I obesity: Body mass index is 31.76 kg/m.    Generalized weakness and deconditioning--PT eval appreciated recommends SNF rehab.  Bernardino KATHEE Come, MD [facility] [email or web address] 11/11/2024, 1:59 PM

## 2024-11-11 NOTE — [place] FL2 (Signed)
 " [place]  MEDICAID FL2 LEVEL OF CARE FORM     IDENTIFICATION  Patient Name: Carla [name] Birthdate: 03/29/1951 Sex: female Admission Date (Current Location): 11/09/2024  [facility] and [place] Number:  [employer] and Address:  [facility],  [address], [place] [address]      Provider Number: [id number]  Attending Physician Name and Address:  Bryn Bernardino NOVAK, MD  Relative Name and Phone Number:       Current Level of Care: Hospital Recommended Level of Care: Skilled Nursing Facility Prior Approval Number:    Date Approved/Denied:   PASRR Number: [id number]  Discharge Plan: SNF    Current Diagnoses: Patient Active Problem List   Diagnosis Date Noted   Acute respiratory failure with hypoxia (HCC) 11/09/2024   Symptomatic carotid artery stenosis, left 07/24/2022   Bilateral leg edema 05/25/2017   Precordial chest pain 05/25/2017   Acute coronary syndrome (HCC) 08/19/2016   Contusion, multiple sites 06/01/2016   Degenerative spondylolisthesis 05/09/2016   Intractable nausea and vomiting 03/10/2016   Abdominal pain 03/10/2016   Abnormal thyroid  function test 03/08/2016   Chest pain, unspecified 03/07/2016   Epigastric abdominal pain 03/07/2016   Cholecystitis, acute 03/07/2016   Chronic pain syndrome 03/07/2016   Prediabetes 03/07/2016   Obesity, morbid (HCC) 03/07/2016   Elevated troponin 12/21/2015   Fall 12/21/2015   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   Erythrocytosis    Essential hypertension 06/29/2015   Spinal stenosis, lumbar region, 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   Anxiety attack 03/08/2012   COPD (chronic obstructive pulmonary disease) (HCC) 03/05/2012    Orientation RESPIRATION BLADDER Height & Weight     Self, Place  O2 (2L) Continent Weight: 185 lb (83.9 kg) Height:  5' 4 (162.6 cm)  BEHAVIORAL SYMPTOMS/MOOD NEUROLOGICAL BOWEL NUTRITION STATUS      Continent Diet (See DC summary)  AMBULATORY STATUS  COMMUNICATION OF NEEDS Skin   Limited Assist Verbally Normal                       Personal Care Assistance Level of Assistance  Bathing, Feeding, Dressing Bathing Assistance: Maximum assistance Feeding assistance: Limited assistance Dressing Assistance: Maximum assistance     Functional Limitations Info  Sight, Hearing, Speech Sight Info: Adequate Hearing Info: Adequate Speech Info: Adequate    SPECIAL CARE FACTORS FREQUENCY  PT (By licensed PT), OT (By licensed OT)     PT Frequency: 5x/wk OT Frequency: 5x/wk            Contractures Contractures Info: Not present    Additional Factors Info  Code Status, Allergies, Psychotropic Code Status Info: FULL Allergies Info: Bee Venom, Coconut (Cocos Nucifera), Coconut Flavoring Agent (Non-screening), Hydralazine , [place], Mushroom, Mushroom Extract Complex (Obsolete), Amitriptyline, Diclofenac , Ketorolac , Trazodone And Nefazodone, Morphine , Toradol  (Ketorolac  Tromethamine ), Tramadol Psychotropic Info: Cymbalta          Current Medications (11/11/2024):  This is the current hospital active medication list Current Facility-Administered Medications  Medication Dose Route Frequency Provider Last Rate Last Admin   acetaminophen  (TYLENOL ) tablet 650 mg  650 mg Oral Q6H PRN Elgergawy, Dawood S, MD   650 mg at 11/10/24 1949   Or   acetaminophen  (TYLENOL ) suppository 650 mg  650 mg Rectal Q6H PRN Elgergawy, Dawood S, MD       albuterol  (PROVENTIL ) (2.5 MG/3ML) 0.083% nebulizer solution 2.5 mg  2.5 mg Nebulization Q2H PRN Elgergawy, Dawood S, MD   2.5 mg at 11/10/24 1355   aspirin  chewable  tablet 81 mg  81 mg Oral BH-q7a Elgergawy, Dawood S, MD   81 mg at 11/11/24 1003   atorvastatin  (LIPITOR ) tablet 80 mg  80 mg Oral q1800 Elgergawy, Dawood S, MD       clopidogrel  (PLAVIX ) tablet 75 mg  75 mg Oral Daily Emokpae, Courage, MD   75 mg at 11/11/24 1001   dextromethorphan -guaiFENesin  (MUCINEX  DM) 30-600 MG per 12 hr tablet 1 tablet  1  tablet Oral BID Emokpae, Courage, MD   1 tablet at 11/11/24 1003   DULoxetine  (CYMBALTA ) DR capsule 60 mg  60 mg Oral Daily Elgergawy, Dawood S, MD   60 mg at 11/11/24 1003   enoxaparin  (LOVENOX ) injection 40 mg  40 mg Subcutaneous Q24H Elgergawy, Dawood S, MD   40 mg at 11/10/24 2131   fluticasone  furoate-vilanterol (BREO ELLIPTA ) 100-25 MCG/ACT 1 puff  1 puff Inhalation Daily Elgergawy, Brayton RAMAN, MD   1 puff at 11/11/24 1002   guaiFENesin  (ROBITUSSIN) 100 MG/5ML liquid 5 mL  5 mL Oral Q4H PRN Jesus America, NP   5 mL at 11/10/24 0541   ipratropium-albuterol  (DUONEB) 0.5-2.5 (3) MG/3ML nebulizer solution 3 mL  3 mL Nebulization TID Pearlean Manus, MD   3 mL at 11/11/24 1012   levETIRAcetam  (KEPPRA ) tablet 500 mg  500 mg Oral BID Elgergawy, Dawood S, MD   500 mg at 11/11/24 1003   levothyroxine  (SYNTHROID ) tablet 25 mcg  25 mcg Oral QAC breakfast Elgergawy, Dawood S, MD   25 mcg at 11/11/24 0501   methylPREDNISolone  sodium succinate (SOLU-MEDROL ) 125 mg/2 mL injection 60 mg  60 mg Intravenous Daily Bryn Bernardino NOVAK, MD   60 mg at 11/11/24 1003   metoprolol  succinate (TOPROL -XL) 24 hr tablet 25 mg  25 mg Oral BH-q7a Elgergawy, Dawood S, MD   25 mg at 11/11/24 1003   nicotine  (NICODERM CQ  - dosed in mg/24 hours) patch 14 mg  14 mg Transdermal Daily Elgergawy, Dawood S, MD       nitroGLYCERIN  (NITROSTAT ) SL tablet 0.4 mg  0.4 mg Sublingual Q5 Min x 3 PRN Elgergawy, Brayton RAMAN, MD       oseltamivir  (TAMIFLU ) capsule 30 mg  30 mg Oral BID Elgergawy, Dawood S, MD   30 mg at 11/11/24 1003   pantoprazole  (PROTONIX ) EC tablet 40 mg  40 mg Oral Daily Elgergawy, Dawood S, MD   40 mg at 11/11/24 1003   polyethylene glycol (MIRALAX  / GLYCOLAX ) packet 17 g  17 g Oral Daily PRN Elgergawy, Dawood S, MD       tiZANidine  (ZANAFLEX ) tablet 2 mg  2 mg Oral TID Emokpae, Courage, MD   2 mg at 11/11/24 1003     Discharge Medications: Please see discharge summary for a list of discharge medications.  Relevant Imaging  Results:  Relevant Lab Results:   Additional Information SSN: [id number]  Hoy DELENA Bigness, LCSW     "

## 2024-11-12 DIAGNOSIS — J9601 Acute respiratory failure with hypoxia: Secondary | ICD-10-CM | POA: Diagnosis not present

## 2024-11-12 MED ORDER — FLUTICASONE FUROATE-VILANTEROL 100-25 MCG/ACT IN AEPB: 1.0000 | INHALATION_SPRAY | Freq: Every day | RESPIRATORY_TRACT | Status: AC

## 2024-11-12 MED ORDER — PREDNISONE 20 MG PO TABS: 40.0000 mg | ORAL_TABLET | Freq: Every day | ORAL | Status: AC

## 2024-11-12 MED ORDER — OSELTAMIVIR PHOSPHATE 30 MG PO CAPS: 30.0000 mg | ORAL_CAPSULE | Freq: Two times a day (BID) | ORAL | Status: AC

## 2024-11-12 NOTE — Discharge Summary (Signed)
 " Physician Discharge Summary   Patient: Carla [name] MRN: [id number] DOB: 1951/05/31  Admit date:     11/09/2024  Discharge date: 11/12/24  Discharge Physician: Bernardino KATHEE Come   PCP: Holli Vernell DEL, NP   Recommendations at discharge:  Monitor BP and HR, noting resting sinus bradycardia. With hx CAD, low dose beta blocker continued due to no hypotension. Consider TSH testing.   Discharge Diagnoses: Principal Problem:   Acute respiratory failure with hypoxia (HCC) Active Problems:   COPD (chronic obstructive pulmonary disease) (HCC)   Essential hypertension   Spinal stenosis, lumbar region, with neurogenic claudication   Depression with pseudodementia   Obesity, morbid [facility])  Hospital Course: Carla [name] is a 74 y.o. female with a history of COPD, multiple CVAs, seizure disorder (recent Dx in Dec 2025), CAD, tobacco use, hypothyroidism who presented to the ED on 11/09/2024 with respiratory distress found to have acute hypoxic respiratory failure due to influenza A infection, AECOPD, and acute on chronic HFpEF.  Assessment and Plan: Acute respiratory failure with hypoxia Influenza A infection COPD exacerbation - Liberated from supplemental oxygen , bronchospasm has durably resolved. - Continue tamiflu  (1/28 - 2/1) - Complete steroid burst with prednisone  40mg  daily x2 more days.  - Continue inhaled therapies: Breo ellipta  inhaler will be provided at discharge to be used until out. Then can return to previously prescribed symbicort (which was listed as not taking but will be resumed after Breo Ellipta  is used.    Acute on chronic HFpEF, HTN:  Chest imaging studies on admission consistent with CHF. ProBNP 24,815  - Appears clinically roughly euvolemic, BUN:Cr and Cr rising. Will hold further lasix  doses. Do not suspect this was a primary contributor to symptoms.  - Continue metoprolol , though caution with sinus bradycardia and bronchospasm.  - Continue monitoring I/O, weights.     Seizure disorder: No seizure activity noticed at this time.  - Continue recently added keppra    Hypothyroidism:  - Continue synthroid  25mcg based on historical records.     GERD:  - Continue PPI    History of CAD status post stent in 2017 History of multiple CVAs History of left carotid stenosis status post endarterectomy - Continue DAPT, atorvastatin , and beta blocker (cardioselective).     Chronic pain/neuropathy:  - Continue duloxetine  and antispasmodic.     Hypertension - Continue metoprolol  and lisinopril .   Tobacco abuse - Continue nicotine  patch   Chronic anemia: Stable. No bleeding.    Class I obesity: Body mass index is 31.76 kg/m.    Generalized weakness and deconditioning--PT eval appreciated recommends SNF rehab.  Consultants: None Procedures performed: None  Disposition: Skilled nursing facility Diet recommendation:  Cardiac diet DISCHARGE MEDICATION: Allergies as of 11/12/2024       Reactions   Bee Venom Anaphylaxis   Coconut (cocos Nucifera) Anaphylaxis, Other (See Comments)   Per patient   Coconut Flavoring Agent (non-screening) Anaphylaxis   Per patient   Hydralazine  Shortness Of Breath   Other reaction(s): Headache, Other (See Comments) Chest Pain   Maitake Anaphylaxis   Extreme sweating, vomiting   Mushroom Anaphylaxis   Extreme sweating, vomiting   Mushroom Extract Complex (obsolete) Anaphylaxis   Extreme sweating, vomiting   Amitriptyline Other (See Comments)   Has nightmares when using, not in right state of mind    Diclofenac  Dermatitis, Other (See Comments)   Not helpful   Ketorolac  Nausea And Vomiting, Nausea Only   Trazodone And Nefazodone Nausea And Vomiting, Nausea Only   Morphine   Toradol  [ketorolac  Tromethamine ] Nausea And Vomiting   Tramadol Nausea And Vomiting        Medication List     PAUSE taking these medications    budesonide-formoterol  160-4.5 MCG/ACT inhaler Wait to take this until your doctor or  other care provider tells you to start again. Commonly known as: SYMBICORT Inhale 2 puffs into the lungs 2 (two) times daily.       STOP taking these medications    amoxicillin-clavulanate 875-125 MG tablet Commonly known as: AUGMENTIN   chlorthalidone  25 MG tablet Commonly known as: HYGROTON    ciprofloxacin  500 MG tablet Commonly known as: CIPRO    diphenhydrAMINE  25 mg capsule Commonly known as: BENADRYL    doxycycline  100 MG tablet Commonly known as: VIBRA -TABS   fluconazole 150 MG tablet Commonly known as: DIFLUCAN   fluticasone -salmeterol 250-50 MCG/ACT Aepb Commonly known as: ADVAIR    HYDROcodone -acetaminophen  5-325 MG tablet Commonly known as: NORCO/VICODIN   metFORMIN 1000 MG tablet Commonly known as: GLUCOPHAGE   oxyCODONE -acetaminophen  5-325 MG tablet Commonly known as: PERCOCET/ROXICET   QUEtiapine 25 MG tablet Commonly known as: SEROQUEL       TAKE these medications    acidophilus Caps capsule Take 1 capsule by mouth daily.   albuterol  108 (90 Base) MCG/ACT inhaler Commonly known as: VENTOLIN  HFA Inhale 2 puffs into the lungs every 6 (six) hours as needed for wheezing.   aspirin  81 MG chewable tablet Chew 81 mg by mouth every morning.   atorvastatin  80 MG tablet Commonly known as: LIPITOR  Take 1 tablet (80 mg total) by mouth daily at 6 PM.   clopidogrel  75 MG tablet Commonly known as: PLAVIX  Take 75 mg by mouth daily.   DULoxetine  20 MG capsule Commonly known as: CYMBALTA  Take 20 mg by mouth daily.   fluticasone  furoate-vilanterol 100-25 MCG/ACT Aepb Commonly known as: BREO ELLIPTA  Inhale 1 puff into the lungs daily.   guaiFENesin -dextromethorphan  100-10 MG/5ML syrup Commonly known as: ROBITUSSIN DM Take 5 mLs by mouth every 4 (four) hours as needed for cough.   levETIRAcetam  500 MG tablet Commonly known as: KEPPRA  Take 500 mg by mouth 2 (two) times daily.   levothyroxine  25 MCG tablet Commonly known as: SYNTHROID  Take 25  mcg by mouth daily before breakfast. What changed: Another medication with the same name was removed. Continue taking this medication, and follow the directions you see here.   lidocaine  4 % Place 1 patch onto the skin daily.   lisinopril  20 MG tablet Commonly known as: ZESTRIL  Take 20 mg by mouth every morning.   metoprolol  succinate 25 MG 24 hr tablet Commonly known as: TOPROL -XL Take 25 mg by mouth daily. What changed: Another medication with the same name was removed. Continue taking this medication, and follow the directions you see here.   nicotine  21 mg/24hr patch Commonly known as: NICODERM CQ  - dosed in mg/24 hours Place 21 mg onto the skin daily.   nitroGLYCERIN  0.4 MG SL tablet Commonly known as: NITROSTAT  Place 1 tablet (0.4 mg total) under the tongue every 5 (five) minutes x 3 doses as needed for chest pain.   Omega-3 1000 MG Caps Take 2 g by mouth.   oseltamivir  30 MG capsule Commonly known as: TAMIFLU  Take 1 capsule (30 mg total) by mouth 2 (two) times daily for 2 days.   pantoprazole  20 MG tablet Commonly known as: PROTONIX  Take 20 mg by mouth daily.   predniSONE  20 MG tablet Commonly known as: DELTASONE  Take 2 tablets (40 mg total) by mouth  daily with breakfast for 2 days.   tiZANidine  2 MG tablet Commonly known as: ZANAFLEX  Take 2 mg by mouth 3 (three) times daily.   traZODone 50 MG tablet Commonly known as: DESYREL Take 25 mg by mouth at bedtime as needed.   triamcinolone cream 0.1 % Commonly known as: KENALOG Apply 1 Application topically.        Follow-up Information     Hyler, Vernell DEL, NP Follow up.   Specialty: Nephrology Contact information: [address] [place] [address] [phone]                Discharge Exam: Filed Weights   11/10/24 0514 11/11/24 0600 11/12/24 0500  Weight: 85.2 kg 83.9 kg 85.6 kg  BP (!) 156/72 (BP Location: Left Arm)   Pulse (!) 52   Temp 98.6 F (37 C) (Oral)   Resp 18   Ht  5' 4 (1.626 m)   Wt 85.6 kg   SpO2 96%   BMI 32.39 kg/m   No distress, sitting EOB without oxygen  having breakfast. Denies shortness of breath.  No wheezing, lungs clear bilaterally, nonlabored Regular bradycardia. Ext WWP.  Alert, interactive, no focal deficits noted on limited exam.  Condition at discharge: stable  The results of significant diagnostics from this hospitalization (including imaging, microbiology, ancillary and laboratory) are listed below for reference.   Imaging Studies: DG Chest Port 1 View Result Date: 11/09/2024 CLINICAL DATA:  Shortness of breath. EXAM: PORTABLE CHEST 1 VIEW COMPARISON:  Chest radiograph dated 09/29/2024. FINDINGS: There is diffuse interstitial reticulonodular densities, new since the prior radiograph may represent edema or atypical pneumonia. Clinical correlation is recommended. Small bilateral pleural effusions and right lung base subsegmental atelectasis. No pneumothorax. Mild cardiomegaly. No acute osseous pathology. IMPRESSION: Mild cardiomegaly with interval development of mild CHF and small bilateral pleural effusions. Pneumonia is not excluded Electronically Signed   By: Vanetta Chou M.D.   On: 11/09/2024 13:08    Microbiology: Results for orders placed or performed during the hospital encounter of 11/09/24  Resp Panel by RT-PCR (Flu A&B, Covid) Anterior Nasal Swab     Status: Abnormal   Collection Time: 11/09/24 12:16 PM   Specimen: Anterior Nasal Swab  Result Value Ref Range Status   SARS Coronavirus 2 by RT PCR NEGATIVE NEGATIVE Final    Comment: (NOTE) SARS-CoV-2 target nucleic acids are NOT DETECTED.  The SARS-CoV-2 RNA is generally detectable in upper respiratory specimens during the acute phase of infection. The lowest concentration of SARS-CoV-2 viral copies this assay can detect is 138 copies/mL. A negative result does not preclude SARS-Cov-2 infection and should not be used as the sole basis for treatment or other  patient management decisions. A negative result may occur with  improper specimen collection/handling, submission of specimen other than nasopharyngeal swab, presence of viral mutation(s) within the areas targeted by this assay, and inadequate number of viral copies(<138 copies/mL). A negative result must be combined with clinical observations, patient history, and epidemiological information. The expected result is Negative.  Fact Sheet for Patients:  bloggercourse.com  Fact Sheet for Healthcare Providers:  seriousbroker.it  This test is no t yet approved or cleared by the [place]  FDA and  has been authorized for detection and/or diagnosis of SARS-CoV-2 by FDA under an Emergency Use Authorization (EUA). This EUA will remain  in effect (meaning this test can be used) for the duration of the COVID-19 declaration under Section 564(b)(1) of the Act, 21 U.S.C.section 360bbb-3(b)(1), unless  the authorization is terminated  or revoked sooner.       Influenza A by PCR POSITIVE (A) NEGATIVE Final   Influenza B by PCR NEGATIVE NEGATIVE Final    Comment: (NOTE) The Xpert Xpress SARS-CoV-2/FLU/RSV plus assay is intended as an aid in the diagnosis of influenza from Nasopharyngeal swab specimens and should not be used as a sole basis for treatment. Nasal washings and aspirates are unacceptable for Xpert Xpress SARS-CoV-2/FLU/RSV testing.  Fact Sheet for Patients: bloggercourse.com  Fact Sheet for Healthcare Providers: seriousbroker.it  This test is not yet approved or cleared by the [place]  FDA and has been authorized for detection and/or diagnosis of SARS-CoV-2 by FDA under an Emergency Use Authorization (EUA). This EUA will remain in effect (meaning this test can be used) for the duration of the COVID-19 declaration under Section 564(b)(1) of the Act, 21 U.S.C. section  360bbb-3(b)(1), unless the authorization is terminated or revoked.  Performed at [facility], [address] [place], [place] [address]     Labs: CBC: Recent Labs  Lab 11/09/24 1150 11/10/24 0435 11/11/24 0422  WBC 11.8* 9.1 18.8*  HGB 10.0* 9.7* 9.9*  HCT 31.7* 30.2* 30.4*  MCV 93.0 91.8 90.7  PLT 387 337 368   Basic Metabolic Panel: Recent Labs  Lab 11/09/24 1150 11/10/24 0435 11/11/24 0422  NA 139 141 139  K 4.3 4.3 4.2  CL 102 102 100  CO2 21* 27 24  GLUCOSE 135* 140* 133*  BUN 12 16 28*  CREATININE 1.03* 1.01* 1.11*  CALCIUM  8.7* 8.9 9.0  PHOS  --   --  3.8   Liver Function Tests: Recent Labs  Lab 11/11/24 0422  ALBUMIN  3.0*   CBG: No results for input(s): GLUCAP in the last 168 hours.  Discharge time spent: greater than 30 minutes.  Signed: Bernardino KATHEE Come, MD [facility] 11/12/2024 "

## 2024-11-12 NOTE — Plan of Care (Signed)
  Problem: Education: Goal: Knowledge of General Education information will improve Description: Including pain rating scale, medication(s)/side effects and non-pharmacologic comfort measures Outcome: Progressing   Problem: Health Behavior/Discharge Planning: Goal: Ability to manage health-related needs will improve Outcome: Progressing   Problem: Clinical Measurements: Goal: Ability to maintain clinical measurements within normal limits will improve Outcome: Progressing   Problem: Coping: Goal: Level of anxiety will decrease Outcome: Progressing   Problem: Elimination: Goal: Will not experience complications related to bowel motility Outcome: Progressing   Problem: Safety: Goal: Ability to remain free from injury will improve Outcome: Progressing   Problem: Skin Integrity: Goal: Risk for impaired skin integrity will decrease Outcome: Progressing   

## 2024-11-12 NOTE — Plan of Care (Signed)
  Problem: Education: Goal: Knowledge of General Education information will improve Description: Including pain rating scale, medication(s)/side effects and non-pharmacologic comfort measures Outcome: Progressing   Problem: Health Behavior/Discharge Planning: Goal: Ability to manage health-related needs will improve Outcome: Progressing   Problem: Clinical Measurements: Goal: Ability to maintain clinical measurements within normal limits will improve Outcome: Progressing Goal: Will remain free from infection Outcome: Progressing Goal: Diagnostic test results will improve Outcome: Progressing Goal: Respiratory complications will improve Outcome: Progressing Goal: Cardiovascular complication will be avoided Outcome: Progressing   Problem: Activity: Goal: Risk for activity intolerance will decrease Outcome: Progressing   Problem: Nutrition: Goal: Adequate nutrition will be maintained Outcome: Progressing   Problem: Coping: Goal: Level of anxiety will decrease Outcome: Progressing   Problem: Elimination: Goal: Will not experience complications related to bowel motility Outcome: Progressing Goal: Will not experience complications related to urinary retention Outcome: Progressing   Problem: Pain Managment: Goal: General experience of comfort will improve and/or be controlled Outcome: Progressing   Problem: Safety: Goal: Ability to remain free from injury will improve Outcome: Progressing   Problem: Skin Integrity: Goal: Risk for impaired skin integrity will decrease Outcome: Progressing   Problem: Education: Goal: Ability to demonstrate management of disease process will improve Outcome: Progressing Goal: Ability to verbalize understanding of medication therapies will improve Outcome: Progressing   Problem: Activity: Goal: Capacity to carry out activities will improve Outcome: Progressing   Problem: Cardiac: Goal: Ability to achieve and maintain adequate  cardiopulmonary perfusion will improve Outcome: Progressing

## 2024-11-13 DIAGNOSIS — J9601 Acute respiratory failure with hypoxia: Secondary | ICD-10-CM | POA: Diagnosis not present

## 2024-11-13 MED ORDER — IPRATROPIUM-ALBUTEROL 0.5-2.5 (3) MG/3ML IN SOLN
3.0000 mL | Freq: Two times a day (BID) | RESPIRATORY_TRACT | Status: DC
  Administered 2024-11-13 – 2024-11-15 (×4): 3 mL via RESPIRATORY_TRACT
  Filled 2024-11-13 (×3): qty 3

## 2024-11-13 NOTE — Progress Notes (Signed)
" °  Hospital Course: Carla [name] is a 74 y.o. female with a history of COPD, multiple CVAs, seizure disorder (recent Dx in Dec 2025), CAD, tobacco use, hypothyroidism who presented to the ED on 11/09/2024 with respiratory distress found to have acute hypoxic respiratory failure due to influenza A infection, AECOPD, and acute on chronic HFpEF.  Subjective: Breathing better, no complaints. Discharged yesterday but no transportation able to move her out of the hospital.   Objective: BP 107/74 (BP Location: Right Arm)   Pulse (!) 106   Temp 97.8 F (36.6 C) (Oral)   Resp 17   Ht 5' 4 (1.626 m)   Wt 84.3 kg   SpO2 97%   BMI 31.90 kg/m   Nonlabored  Assessment and Plan: Acute respiratory failure with hypoxia Influenza A infection COPD exacerbation - Liberated from supplemental oxygen , bronchospasm has durably resolved. - Continue tamiflu  (1/28 - 2/1) - Complete steroid burst with prednisone  40mg  daily thru 2/1, today. - Continue inhaled therapies: Breo ellipta  inhaler will be provided at discharge to be used until out. Then can return to previously prescribed symbicort (which was listed as not taking but will be resumed after Breo Ellipta  is used.    Acute on chronic HFpEF, HTN:  Chest imaging studies on admission consistent with CHF. ProBNP 24,815  - Appears clinically roughly euvolemic, BUN:Cr and Cr rising. Will hold further lasix  doses. Do not suspect this was a primary contributor to symptoms.  - Continue metoprolol , though caution with sinus bradycardia and bronchospasm.  - Continue monitoring I/O, weights.    Seizure disorder: No seizure activity noticed at this time.  - Continue recently added keppra    Hypothyroidism:  - Continue synthroid  25mcg based on historical records.     GERD:  - Continue PPI    History of CAD status post stent in 2017 History of multiple CVAs History of left carotid stenosis status post endarterectomy - Continue DAPT, atorvastatin , and beta  blocker (cardioselective).     Chronic pain/neuropathy:  - Continue duloxetine  and antispasmodic.     Hypertension - Continue metoprolol  and lisinopril .   Tobacco abuse - Continue nicotine  patch   Chronic anemia: Stable. No bleeding.    Class I obesity: Body mass index is 31.76 kg/m.    Generalized weakness and deconditioning--PT eval appreciated recommends SNF rehab.   Disposition: Skilled nursing facility - unable to secure transport at this time. She was discharge 1/31 and remains medically stable at this tim.      Bernardino KATHEE Come, MD [facility] 11/13/2024 "

## 2024-11-13 NOTE — Plan of Care (Signed)
   Problem: Education: Goal: Knowledge of General Education information will improve Description Including pain rating scale, medication(s)/side effects and non-pharmacologic comfort measures Outcome: Progressing   Problem: Health Behavior/Discharge Planning: Goal: Ability to manage health-related needs will improve Outcome: Progressing

## 2024-11-14 DIAGNOSIS — J9601 Acute respiratory failure with hypoxia: Secondary | ICD-10-CM | POA: Diagnosis not present

## 2024-11-14 MED ORDER — BISACODYL 10 MG RE SUPP
10.0000 mg | Freq: Once | RECTAL | Status: AC
  Administered 2024-11-15: 10 mg via RECTAL
  Filled 2024-11-14: qty 1

## 2024-11-14 MED ORDER — SENNOSIDES-DOCUSATE SODIUM 8.6-50 MG PO TABS
2.0000 | ORAL_TABLET | Freq: Every day | ORAL | Status: DC
  Administered 2024-11-14: 2 via ORAL
  Filled 2024-11-14: qty 2

## 2024-11-14 NOTE — TOC Progression Note (Addendum)
 Transition of Care [facility]) - Progression Note    Patient Details  Name: Carla [name] MRN: [id number] Date of Birth: [birth year]  Transition of [facility]) CM/SW Contact  Hoy DELENA Bigness, LCSW Phone Number: 11/14/2024, 9:43 AM  Clinical Narrative:    [facility] unable to accept pt until tomorrow. Avoidable days added.  Left VM w/ [facility] to determine if temporary placement at their facility would be an option.   ADDENDUM: [facility] unable to accept.    Expected Discharge Plan: Skilled Nursing Facility Barriers to Discharge: Continued Medical Work up               Expected Discharge Plan and Services In-house Referral: Clinical Social Work Discharge Planning Services: NA Post Acute Care Choice: Home Health Living arrangements for the past 2 months: Skilled Nursing Facility Expected Discharge Date: 11/12/24               DME Arranged: N/A DME Agency: NA                   Social Drivers of Health (SDOH) Interventions SDOH Screenings   Food Insecurity: No Food Insecurity (11/09/2024)  Housing: High Risk (11/09/2024)  Transportation Needs: No Transportation Needs (11/09/2024)  Utilities: Not At Risk (11/09/2024)  Financial Resource Strain: High Risk (09/16/2024)   Received from [facility]  Physical Activity: Insufficiently Active (09/16/2024)   Received from [facility]  Social Connections: Socially Isolated (11/09/2024)  Stress: Stress Concern Present (09/16/2024)   Received from [facility]  Tobacco Use: High Risk (11/09/2024)  Health Literacy: Medium Risk (09/16/2024)   Received from [facility]    Readmission Risk Interventions    11/14/2024    9:43 AM 11/10/2024    3:30 PM  Readmission Risk Prevention Plan  Post Dischage Appt  Complete  Medication Screening  Complete  Transportation Screening Complete Complete  PCP or Specialist Appt within 5-7 Days Complete   Home Care Screening Complete   Medication Review (RN CM) Complete

## 2024-11-14 NOTE — Plan of Care (Signed)

## 2024-11-14 NOTE — Progress Notes (Signed)
" °  Hospital Course: Carla [name] is a 74 y.o. female with a history of COPD, multiple CVAs, seizure disorder (recent Dx in Dec 2025), CAD, tobacco use, hypothyroidism who presented to the ED on 11/09/2024 with respiratory distress found to have acute hypoxic respiratory failure due to influenza A infection, AECOPD, and acute on chronic HFpEF.  Subjective: No problems, moved to 2nd floor, just reading her books waiting to get picked up. Did get a breathing treatment for wheezing which has resolved.   Objective: BP (!) 169/52   Pulse (!) 48   Temp 97.7 F (36.5 C) (Oral)   Resp 20   Ht 5' 4 (1.626 m)   Wt 84.3 kg   SpO2 100%   BMI 31.90 kg/m   No distress, sitting EOB. Looks fine.  Clear, nonlabored Regular bradycardia.   Assessment and Plan: Acute respiratory failure with hypoxia Influenza A infection COPD exacerbation - Liberated from supplemental oxygen , bronchospasm has durably resolved. - Continue tamiflu  (1/28 - 2/1) - Completed steroid - Continue inhaled therapies: Breo ellipta  inhaler will be provided at discharge to be used until out. Then can return to previously prescribed symbicort (which was listed as not taking but will be resumed after Breo Ellipta  is used).    Acute on chronic HFpEF, HTN:  Chest imaging studies on admission consistent with CHF. ProBNP 24,815  - Appears clinically roughly euvolemic, no ongoing diuretic needed. - Continue metoprolol , though caution with sinus bradycardia and bronchospasm.  - Continue monitoring I/O, weights.    Seizure disorder: No seizure activity noticed at this time.  - Continue recently added keppra    Hypothyroidism:  - Continue synthroid  25mcg based on historical records.     GERD:  - Continue PPI    History of CAD status post stent in 2017 History of multiple CVAs History of left carotid stenosis status post endarterectomy - Continue DAPT, atorvastatin , and beta blocker (cardioselective).     Chronic  pain/neuropathy:  - Continue duloxetine  and antispasmodic.     Hypertension - Continue metoprolol  and lisinopril .   Tobacco abuse - Continue nicotine  patch   Chronic anemia: Stable. No bleeding.    Class I obesity: Body mass index is 31.76 kg/m.    Generalized weakness and deconditioning--PT eval appreciated recommends SNF rehab.   Disposition: Skilled nursing facility - unable to secure transport at this time. She was discharge 1/31 and remains medically stable at this time.     Bernardino KATHEE Come, MD [facility] 11/14/2024 "

## 2024-11-14 NOTE — Plan of Care (Signed)
  Problem: Clinical Measurements: Goal: Cardiovascular complication will be avoided Outcome: Progressing   Problem: Nutrition: Goal: Adequate nutrition will be maintained Outcome: Progressing   

## 2024-11-14 NOTE — Progress Notes (Signed)
 Physical Therapy Treatment Patient Details Name: Carla [name] MRN: [id number] DOB: 1951-03-05 Today's Date: 11/14/2024   History of Present Illness Aviance Cooperwood  is a 74 y.o. female, with a history of hypertension, COPD, osteoarthritis of the knee, GERD, multiple cerebrovascular accidents, coronary artery disease, hypothyroidism, and tobacco use, recent admission at [facility] in December with diagnosis of seizure, started on Keppra , apparently [facility].  Patient was sent to ED today secondary to complaints of shortness of breath and respiratory distress, she was noted to have difficulty breathing, feeling sick over last 24 hours, as well having some nausea and vomiting, there is report of some sick contacts in the nursing home.  -Her workup in ED was noted her being febrile 100.5, saturating 85% on room air, x-ray with evidence of volume overload and pleural effusion, respiratory panel significant for positive influenza A, she was started on steroids, nebulizer treatment, requiring 2 L oxygen  and [facility] consulted to admit.    PT Comments  Patient agreeable for therapy. Patient demonstrates good return for completing BLE exercises, increased endurance/distance for gait training without loss of balance using her rollator, but limited mostly due to fatigue. Patient tolerated staying up in chair after therapy. Patient will benefit from continued skilled physical therapy in hospital and recommended venue below to increase strength, balance, endurance for safe ADLs and gait.      If plan is discharge home, recommend the following: A little help with walking and/or transfers;A little help with bathing/dressing/bathroom;Help with stairs or ramp for entrance;Assist for transportation   Can travel by private vehicle     Yes  Equipment Recommendations  None recommended by PT    Recommendations for Other Services       Precautions / Restrictions Precautions Precautions:  Fall Recall of Precautions/Restrictions: Intact Restrictions Weight Bearing Restrictions Per Provider Order: No     Mobility  Bed Mobility Overal bed mobility: Needs Assistance Bed Mobility: Supine to Sit, Sit to Supine     Supine to sit: Contact guard Sit to supine: Contact guard assist   General bed mobility comments: labored movement, increased time    Transfers Overall transfer level: Needs assistance Equipment used: Rolling walker (2 wheels) Transfers: Sit to/from Stand, Bed to chair/wheelchair/BSC Sit to Stand: Contact guard assist, Min assist   Step pivot transfers: Contact guard assist, Min assist       General transfer comment: slightly labored movement, increased time    Ambulation/Gait Ambulation/Gait assistance: Contact guard assist, Min assist Gait Distance (Feet): 55 Feet Assistive device: Rollator (4 wheels) Gait Pattern/deviations: Decreased step length - right, Decreased step length - left, Decreased stride length Gait velocity: decreased     General Gait Details: increased endurance/distance for gait training with slightly labored movement, no loss of balance using her Rollator, limited mostly due to fatigue   Stairs             Wheelchair Mobility     Tilt Bed    Modified Rankin (Stroke Patients Only)       Balance Overall balance assessment: Needs assistance Sitting-balance support: Feet supported, No upper extremity supported Sitting balance-Leahy Scale: Good Sitting balance - Comments: seated at EOB   Standing balance support: During functional activity, Bilateral upper extremity supported Standing balance-Leahy Scale: Fair Standing balance comment: fair/good using RW                            Communication Communication Communication: No  apparent difficulties  Cognition Arousal: Alert Behavior During Therapy: WFL for tasks assessed/performed                             Following commands:  Intact      Cueing Cueing Techniques: Verbal cues  Exercises General Exercises - Lower Extremity Long Arc Quad: Seated, AAROM, Both, 10 reps Hip Flexion/Marching: Seated, AROM, Strengthening, Both, 10 reps Toe Raises: Seated, AROM, Strengthening, Both, 10 reps Heel Raises: Seated, AROM, Strengthening, Both, 10 reps    General Comments        Pertinent Vitals/Pain Pain Assessment Pain Assessment: No/denies pain    Home Living                          Prior Function            PT Goals (current goals can now be found in the care plan section) Acute Rehab PT Goals Patient Stated Goal: return home PT Goal Formulation: With patient Time For Goal Achievement: 11/24/24 Potential to Achieve Goals: Good Progress towards PT goals: Progressing toward goals    Frequency    Min 3X/week      PT Plan      Co-evaluation              AM-PAC PT 6 Clicks Mobility   Outcome Measure  Help needed turning from your back to your side while in a flat bed without using bedrails?: A Little Help needed moving from lying on your back to sitting on the side of a flat bed without using bedrails?: A Little Help needed moving to and from a bed to a chair (including a wheelchair)?: A Little Help needed standing up from a chair using your arms (e.g., wheelchair or bedside chair)?: A Little Help needed to walk in hospital room?: A Little Help needed climbing 3-5 steps with a railing? : A Lot 6 Click Score: 17    End of Session   Activity Tolerance: Patient tolerated treatment well;Patient limited by fatigue Patient left: in chair;with call bell/phone within reach Nurse Communication: Mobility status PT Visit Diagnosis: Unsteadiness on feet (R26.81);Other abnormalities of gait and mobility (R26.89);Muscle weakness (generalized) (M62.81)     Time: 8541-8480 PT Time Calculation (min) (ACUTE ONLY): 21 min  Charges:    $Gait Training: 8-22 mins $Therapeutic Exercise:  8-22 mins PT General Charges $$ ACUTE PT VISIT: 1 Visit                     3:49 PM, 11/14/24 Lynwood Music, MPT Physical Therapist with [facility] 336 [phone] office [phone] mobile phone

## 2024-11-15 NOTE — Progress Notes (Signed)
" °  Bray,Cheri Daughter, Emergency Contact [phone]  I spoke to cheri bray on the phone and she states that she's on the way to pick up patient from the hospital and patient is not going to [facility] and is going to discharge home with her. "

## 2024-11-15 NOTE — Care Management Important Message (Signed)
 Important Message  Patient Details  Name: Carla [name] MRN: [id number] Date of Birth: [birth year]   Important Message Given:  Yes - Medicare IM     Marissia Blackham L Laresha Bacorn 11/15/2024, 11:20 AM

## 2024-11-16 ENCOUNTER — Telehealth: Payer: Self-pay

## 2024-11-16 NOTE — Transitions of Care (Post Inpatient/ED Visit) (Signed)
" ° °  11/16/2024  Name: LEQUISHA CAMMACK MRN: [id number] DOB: [birth year]  Today's TOC FU Call Status: Today's TOC FU Call Status:: Unsuccessful Call (1st Attempt) Unsuccessful Call (1st Attempt) Date: 11/16/24  Attempted to reach the patient regarding the most recent Inpatient/ED visit.  Follow Up Plan: Additional outreach attempts will be made to reach the patient to complete the Transitions of Care (Post Inpatient/ED visit) call.   Gorge Almanza J. Whitnee Orzel RN, MSN [facility], [facility] RN Care Manager Direct Dial: [phone]  Fax: [phone] Website: [email or web address]   "
